# Patient Record
Sex: Male | Born: 1965 | Race: White | Hispanic: No | Marital: Married | Smoking: Former smoker
Health system: Southern US, Community
[De-identification: ages and names within clinical notes are randomized; demographics above are authoritative.]

## PROBLEM LIST (undated history)

## (undated) DIAGNOSIS — K219 Gastro-esophageal reflux disease without esophagitis: Secondary | ICD-10-CM

## (undated) DIAGNOSIS — G4700 Insomnia, unspecified: Secondary | ICD-10-CM

## (undated) DIAGNOSIS — J449 Chronic obstructive pulmonary disease, unspecified: Secondary | ICD-10-CM

## (undated) DIAGNOSIS — E039 Hypothyroidism, unspecified: Secondary | ICD-10-CM

## (undated) DIAGNOSIS — E119 Type 2 diabetes mellitus without complications: Secondary | ICD-10-CM

## (undated) DIAGNOSIS — J302 Other seasonal allergic rhinitis: Secondary | ICD-10-CM

## (undated) DIAGNOSIS — F419 Anxiety disorder, unspecified: Secondary | ICD-10-CM

## (undated) DIAGNOSIS — H7290 Unspecified perforation of tympanic membrane, unspecified ear: Secondary | ICD-10-CM

## (undated) DIAGNOSIS — M1990 Unspecified osteoarthritis, unspecified site: Secondary | ICD-10-CM

## (undated) DIAGNOSIS — G5603 Carpal tunnel syndrome, bilateral upper limbs: Secondary | ICD-10-CM

## (undated) DIAGNOSIS — G4730 Sleep apnea, unspecified: Secondary | ICD-10-CM

## (undated) DIAGNOSIS — F32A Depression, unspecified: Secondary | ICD-10-CM

## (undated) DIAGNOSIS — K120 Recurrent oral aphthae: Secondary | ICD-10-CM

## (undated) DIAGNOSIS — Z87438 Personal history of other diseases of male genital organs: Secondary | ICD-10-CM

## (undated) DIAGNOSIS — E782 Mixed hyperlipidemia: Secondary | ICD-10-CM

## (undated) DIAGNOSIS — K5732 Diverticulitis of large intestine without perforation or abscess without bleeding: Secondary | ICD-10-CM

## (undated) DIAGNOSIS — I10 Essential (primary) hypertension: Secondary | ICD-10-CM

## (undated) DIAGNOSIS — G894 Chronic pain syndrome: Secondary | ICD-10-CM

## (undated) DIAGNOSIS — N486 Induration penis plastica: Secondary | ICD-10-CM

## (undated) DIAGNOSIS — F329 Major depressive disorder, single episode, unspecified: Secondary | ICD-10-CM

## (undated) DIAGNOSIS — R51 Headache: Secondary | ICD-10-CM

## (undated) HISTORY — DX: Unspecified perforation of tympanic membrane, unspecified ear: H72.90

## (undated) HISTORY — DX: Carpal tunnel syndrome, bilateral upper limbs: G56.03

## (undated) HISTORY — DX: Hypothyroidism, unspecified: E03.9

## (undated) HISTORY — DX: Insomnia, unspecified: G47.00

## (undated) HISTORY — DX: Major depressive disorder, single episode, unspecified: F32.9

## (undated) HISTORY — DX: Diverticulitis of large intestine without perforation or abscess without bleeding: K57.32

## (undated) HISTORY — DX: Essential (primary) hypertension: I10

## (undated) HISTORY — PX: OTHER SURGICAL HISTORY: SHX169

## (undated) HISTORY — DX: Anxiety disorder, unspecified: F41.9

## (undated) HISTORY — DX: Chronic pain syndrome: G89.4

## (undated) HISTORY — DX: Mixed hyperlipidemia: E78.2

## (undated) HISTORY — DX: Gastro-esophageal reflux disease without esophagitis: K21.9

## (undated) HISTORY — DX: Other seasonal allergic rhinitis: J30.2

## (undated) HISTORY — DX: Depression, unspecified: F32.A

## (undated) HISTORY — PX: MENISCUS REPAIR: SHX5179

## (undated) HISTORY — DX: Personal history of other diseases of male genital organs: Z87.438

## (undated) HISTORY — DX: Recurrent oral aphthae: K12.0

## (undated) HISTORY — DX: Type 2 diabetes mellitus without complications: E11.9

## (undated) HISTORY — DX: Induration penis plastica: N48.6

---
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
## 1999-06-16 ENCOUNTER — Emergency Department (HOSPITAL_COMMUNITY): Admission: EM | Admit: 1999-06-16 | Discharge: 1999-06-16 | Payer: Self-pay | Admitting: Emergency Medicine

## 1999-11-14 ENCOUNTER — Encounter: Payer: Self-pay | Admitting: Otolaryngology

## 1999-11-14 ENCOUNTER — Encounter: Admission: RE | Admit: 1999-11-14 | Discharge: 1999-11-14 | Payer: Self-pay | Admitting: Otolaryngology

## 2002-08-14 ENCOUNTER — Encounter: Payer: Self-pay | Admitting: Emergency Medicine

## 2002-08-14 ENCOUNTER — Emergency Department (HOSPITAL_COMMUNITY): Admission: EM | Admit: 2002-08-14 | Discharge: 2002-08-14 | Payer: Self-pay | Admitting: Emergency Medicine

## 2002-12-01 ENCOUNTER — Encounter: Payer: Self-pay | Admitting: Specialist

## 2002-12-01 ENCOUNTER — Ambulatory Visit (HOSPITAL_COMMUNITY): Admission: RE | Admit: 2002-12-01 | Discharge: 2002-12-01 | Payer: Self-pay | Admitting: Specialist

## 2003-03-13 ENCOUNTER — Encounter
Admission: RE | Admit: 2003-03-13 | Discharge: 2003-06-11 | Payer: Self-pay | Admitting: Physical Medicine & Rehabilitation

## 2005-01-18 ENCOUNTER — Ambulatory Visit: Payer: Self-pay | Admitting: *Deleted

## 2007-03-07 ENCOUNTER — Ambulatory Visit: Payer: Self-pay

## 2010-08-19 ENCOUNTER — Encounter: Admission: RE | Admit: 2010-08-19 | Discharge: 2010-08-19 | Payer: Self-pay | Admitting: Orthopedic Surgery

## 2010-09-20 ENCOUNTER — Ambulatory Visit: Payer: Self-pay | Admitting: Vascular Surgery

## 2010-11-08 LAB — BASIC METABOLIC PANEL
BUN: 12 mg/dL (ref 6–23)
CO2: 26 mEq/L (ref 19–32)
Calcium: 9.5 mg/dL (ref 8.4–10.5)
Chloride: 105 mEq/L (ref 96–112)
Creatinine, Ser: 1.3 mg/dL (ref 0.4–1.5)
GFR calc Af Amer: >60 mL/min (ref >60)
GFR calc non Af Amer: 60 mL/min — ABNORMAL LOW (ref >60)
Glucose, Bld: 117 mg/dL — ABNORMAL HIGH (ref 70–99)
Potassium: 4.5 mEq/L (ref 3.5–5.1)
Sodium: 139 mEq/L (ref 135–145)

## 2010-11-08 LAB — CBC
HCT: 44.6 % (ref 39.0–52.0)
Hemoglobin: 14.7 g/dL (ref 13.0–17.0)
MCH: 30.1 pg (ref 26.0–34.0)
MCHC: 33 g/dL (ref 30.0–36.0)
MCV: 91.4 fL (ref 78.0–100.0)
Platelets: 216 10*3/uL (ref 150–400)
RBC: 4.88 MIL/uL (ref 4.22–5.81)
RDW: 12.8 % (ref 11.5–15.5)
WBC: 6.4 10*3/uL (ref 4.0–10.5)

## 2010-11-08 LAB — TYPE AND SCREEN
ABO/RH(D): A POS
Antibody Screen: NEGATIVE

## 2010-11-10 ENCOUNTER — Inpatient Hospital Stay (HOSPITAL_COMMUNITY): Payer: Worker's Compensation

## 2010-11-10 ENCOUNTER — Inpatient Hospital Stay (HOSPITAL_COMMUNITY)
Admission: RE | Admit: 2010-11-10 | Discharge: 2010-11-12 | DRG: 490 | Disposition: A | Discharge status: Home Health | Payer: Worker's Compensation | Attending: Orthopedic Surgery | Admitting: Orthopedic Surgery

## 2010-11-10 DIAGNOSIS — M519 Unspecified thoracic, thoracolumbar and lumbosacral intervertebral disc disorder: Principal | ICD-10-CM | POA: Diagnosis present

## 2010-11-10 DIAGNOSIS — K219 Gastro-esophageal reflux disease without esophagitis: Secondary | ICD-10-CM | POA: Diagnosis present

## 2010-11-10 DIAGNOSIS — E119 Type 2 diabetes mellitus without complications: Secondary | ICD-10-CM | POA: Diagnosis present

## 2010-11-10 DIAGNOSIS — M5137 Other intervertebral disc degeneration, lumbosacral region: Secondary | ICD-10-CM

## 2010-11-10 DIAGNOSIS — I10 Essential (primary) hypertension: Secondary | ICD-10-CM | POA: Diagnosis present

## 2010-11-10 DIAGNOSIS — Z87891 Personal history of nicotine dependence: Secondary | ICD-10-CM

## 2010-11-10 DIAGNOSIS — Z882 Allergy status to sulfonamides status: Secondary | ICD-10-CM

## 2010-11-10 LAB — GLUCOSE, CAPILLARY
Glucose-Capillary: 134 mg/dL — ABNORMAL HIGH (ref 70–99)
Glucose-Capillary: 129 mg/dL — ABNORMAL HIGH (ref 70–99)

## 2010-11-11 ENCOUNTER — Inpatient Hospital Stay (HOSPITAL_COMMUNITY): Payer: Worker's Compensation

## 2010-11-11 DIAGNOSIS — M79609 Pain in unspecified limb: Secondary | ICD-10-CM

## 2010-11-11 LAB — GLUCOSE, CAPILLARY
Glucose-Capillary: 130 mg/dL — ABNORMAL HIGH (ref 70–99)
Glucose-Capillary: 108 mg/dL — ABNORMAL HIGH (ref 70–99)

## 2010-11-12 LAB — GLUCOSE, CAPILLARY

## 2010-11-14 NOTE — Op Note (Signed)
NAME:  Corey Bell, Corey Bell NO.:  [id number]  MEDICAL RECORD NO.:  [id number]           PATIENT TYPE:  I  LOCATION:  5029                         FACILITY:  [facility]  PHYSICIAN:  Alvy Beal, MD    DATE OF BIRTH:  04/22/1966  DATE OF PROCEDURE: DATE OF DISCHARGE:                              OPERATIVE REPORT   PREOPERATIVE DIAGNOSIS:  Diskogenic low back pain, L5-S1.  POSTOPERATIVE DIAGNOSIS:  Diskogenic low back pain, L5-S1.  OPERATIVE PROCEDURE:  Anterior lumbar total disk replacement L5-S1.  INSTRUMENTATION SYSTEM USED:  ProDisc-L from [employer], size was a medium 10 implant.  COMPLICATIONS:  None.  CONDITION:  Stable.  APPROACH SURGEON:  Larina Earthly, MD  HISTORY:  Corey Bell is a very pleasant 45 year old gentleman who has been having progressive debilitating back pain secondary to a work-related injury.  After attempts at conservative management had failed, he elected to proceed with surgery.  All appropriate risks, benefits, and alternatives were discussed with the patient and consent was obtained.  OPERATIVE NOTE:  The patient was brought to the operating room, placed supine on the operating table.  After successful induction of general anesthesia and endotracheal intubation, TEDs, SCDs, and Foley were placed.  The patient's abdomen was prepped and draped in a standard fashion.  Appropriate time-out was done to confirm patient, procedure, and all other pertinent important data.  At this point, Dr. Arbie Cookey then did a standard retroperitoneal approach to the lumbar spine.  I did assist him for this approach.  Once we had adequate exposure of L5-S1, the Thompson retractors were placed and then we switched positions.  Dr. Arbie Cookey then assisted me by retracting and protecting the vascular structures.  I incised the annulus with a 10- blade scalpel, and using a combination of pituitary rongeurs, curettes, and Kerrison rongeurs, I removed all of the L5-S1 disk  material.  I then took a fine-angled curettes and released the annulus from the posterior aspect of the L5 and the S1 vertebral body.  I then used 2- and 3-mm Kerrison to trim down the posterior osteophytes and resected the majority of the annulus.  At this point under live fluoroscopy, I had the laminar spreader in the space and under live fluoroscopy confirmed that I had parallel distraction and that there was no fishmouthing of the vertebral body.  With the diskectomy complete, I then obtained a size 10, 11-degree lordotic trial, and malleted it to the appropriate depth.  I confirmed center position and then made sure that the posterior aspect was right at the posterior lip of the vertebral body. With it properly positioned, I then used the keel cutter and malleted that to the appropriate depth.  With the keel cut made, I removed the implant, the trial lead device, and then took the actual implant and malleted it to the appropriate depth.  Once the implant was properly seated, I then placed the poly and advanced the poly.  I then made sure the poly using a final pusher was properly engaged.  There was no step- off and it was flushed.  There was flush to the implant and there  was no gap.  At this point, I removed the inserter device, irrigated copiously with normal saline.  Dr. Arbie Cookey was there to check that we had adequate hemostasis.  He removed the retractors and then we closed the fascia of the rectus with running #1 Vicryl suture and then I did a running 0 Vicryl for the deep fascia, 2-0 for the superficial, and 3-0 Monocryl for the skin.  Final x-rays were taken which demonstrated midline position of the implant in satisfactory depth on the lateral. Intraoperative AP digital film was read by the radiologist and there was no surgical apparatus left inadvertently.  At this point with the sponge counts correct, a dry dressing was applied.  The patient was extubated, transferred to the  PACU without incident.     Alvy Beal, MD     DDB/MEDQ  D:  11/10/2010  T:  11/11/2010  Job:  [id number]  cc:   Larina Earthly, M.D.  Electronically Signed by Venita Lick MD on 11/14/2010 10:41:17 AM

## 2010-11-16 NOTE — Op Note (Addendum)
  NAME:  Corey Bell, BOMBERGER NO.:  [id number]  MEDICAL RECORD NO.:  [id number]           PATIENT TYPE:  I  LOCATION:  5029                         FACILITY:  [facility]  PHYSICIAN:  Larina Earthly, M.D.    DATE OF BIRTH:  April 08, 1966  DATE OF PROCEDURE:  11/10/2010 DATE OF DISCHARGE:  11/12/2010                              OPERATIVE REPORT   PREOPERATIVE DIAGNOSIS:  Lumbar disk disease.  POSTOPERATIVE DIAGNOSIS:  Lumbar disk disease.  PROCEDURE:  Anterior exposure for L5-S1 ALIF, which will be dictated as a separate note by Dr. Venita Lick.  Co-surgeons for the exposure, Dr. Arbie Cookey and Dr. Shon Baton.  ANESTHESIA:  General endotracheal.  COMPLICATIONS:  None.  INDICATIONS FOR PROCEDURE:  The patient is a 45 year old gentleman with progressively severe L5-S1 disk disease.  I had seen him as an outpatient in our office, discussing my role for exposure.  I explained mobilization of intraperitoneal contents, ureter and iliac vessels, and potential injury of these.  PROCEDURE IN DETAIL:  The patient was taken to the operating room, placed in supine position where the area of the abdomen was prepped and draped in usual sterile fashion.  The patient was significantly obese. A low transverse incision was made from the midline to the left lateral area, carried down through the subcutaneous fat with electrocautery. The anterior fascia was mobilized for closure.  The anterior rectus sheath was opened in line with a skin incision.  The rectus muscle was mobilized circumferentially, proximally and distally to give adequate exposure.  The retroperitoneum was entered bluntly on the lateral aspect below the semilunar line.  The posterior fascia was mobilized, and the posterior fascia was opened laterally above the semilunar line for exposure.  Blunt dissection was used above the level of the psoas plane to mobilize the retroperitoneal contents.  The peritoneal sac was left intact  and was mobilized.  The iliac vessels were noted in the location of L5-S1, this was below the iliac bifurcation.  The middle sacral vessels were divided with Ligaclips for better mobilization.  Blunt dissection was used to give adequate exposure to the lateral aspect of the L5-S1 disk bilaterally.  Thompson retractor was brought on to the field, and the 200 blades were positioned to the right and left of the L5-S1 disk.  The remainder of the procedure will be dictated as a separate note by Dr. Venita Lick.    Larina Earthly, M.D.    TFE/MEDQ  D:  11/14/2010  T:  11/15/2010  Job:  [id number]  Electronically Signed by Branko Steeves M.D. on 11/16/2010 11:24:58 AM

## 2010-11-29 NOTE — Discharge Summary (Signed)
  NAME:  Corey Bell, Corey Bell NO.:  [id number]  MEDICAL RECORD NO.:  [id number]           PATIENT TYPE:  I  LOCATION:  5029                         FACILITY:  [facility]  PHYSICIAN:  Alvy Beal, MD    DATE OF BIRTH:  1966-09-26  DATE OF ADMISSION:  11/10/2010 DATE OF DISCHARGE:  11/12/2010                              DISCHARGE SUMMARY   ADMITTING DIAGNOSIS:  Lumbar degenerative disk disease with diskogenic back pain.  DISCHARGE DIAGNOSIS:  Lumbar degenerative disk disease with diskogenic back pain.  HISTORY:  This is a very pleasant 46 year old gentleman who has had progressive disabling L5-S1 disk pain.  Attempts at conservative management had failed to alleviate his symptoms.  As the result, decision was made to take him to the operating room for a total disk replacement L5-S1.  Please refer to the attached office notes for specifics on the remainder of his past medical, surgical, family and social history.On the day of admission, the patient was taken to the operating room for total disk replacement L5-S1.  Surgery was uneventful.  There was no adverse intraoperative events.  On postoperative day 1, he was doing quite well.  Doppler studies were unremarkable.  X-rays were satisfactory.  He was tolerating regular diet.  On postoperative day 2, he was voiding spontaneously.  Pulses remained intact and he has positive flatus.  The patient was ultimately discharged after clearing physical therapy with preprinted instructions and appropriate followup and medications.     Alvy Beal, MD     DDB/MEDQ  D:  11/24/2010  T:  11/25/2010  Job:  [id number]  Electronically Signed by Venita Lick MD on 11/29/2010 02:30:22 PM

## 2011-02-21 NOTE — Consult Note (Signed)
NEW PATIENT CONSULTATION   Corey Bell, Corey Bell  DOB:  1966/03/03                                       09/20/2010  CHART#:[id number]   The patient presents today for discussion regarding potential anterior  interbody lumbar fusion with anterior approach with Dr. Venita Lick.  I have records provided by Dr. Shon Baton from his most recent evaluation on  08/23/2010.  His opinion was that his best result would be obtained with  lumbar disk replacement at the L5-S1 level with anterior approach.  I am  seeing him to discuss the technical aspects of the anterior approach for  disk replacement.  The patient does have history of hypertension.  He is  felt to be a prediabetic.  He does not require any treatment for this.  He has no other major medical difficulties.   SOCIAL HISTORY:  He works as a Estate manager/land agent.  He does not smoke,  having quit in January 2009.  He does not drink alcohol.  He is married  with no children.   FAMILY HISTORY:  He does have history of premature atherosclerotic  disease in his father and brother.   REVIEW OF SYSTEMS:  He weighs 285 pounds.  He is 6 feet 2 inches tall.  MUSCULOSKELETAL:  Arthritis and joint pain.  PSYCHIATRIC:  Depression.  Otherwise his review of systems is negative.   PHYSICAL EXAMINATION:  Well-developed, obese gentleman in no acute  distress.  Vital signs:  Blood pressure 123/79 pulse 61, respirations  22.  HEENT:  Normal.  Abdomen:  Soft, nontender.  I do not feel any  masses.  He does have moderate obesity.  He does have palpable femoral  pulses and palpable dorsalis pedis pulses bilaterally.  He does have  scattered telangiectasia over both legs but no evidence of varicosities.  Musculoskeletal:  Shows no major deformities or cyanosis.  Neurologic:  No focal weakness or paresthesias.  Skin:  Without ulcers or rashes.   I had a long discussion with [name] and his wife present.  I  explained my role for exposure for  the L5-S1 level for disk replacement  by Dr. Shon Baton.  I explained that there is some modest increased  morbidity due to obesity but certainly do not feel that he has any  prohibitive risk related to this.  I did explain mobilization of the  intraperitoneal contents, the ureter and the iliac arteries and veins  and potential injury of all these.  He understands and wishes to proceed  with surgery.  I feel this is appropriate.  We will coordinate with Dr.  Shon Baton.  Interestingly, his wife has extreme anxiety and reports that  she becomes quite anxious when Mr. Hutmacher is out of her sight and is  quite concerned regarding this and will see her doctor for increased  medication related to this.  Mr. Reitter's mother will be present and, as  Mrs. Micucci puts it, for support for Mr. Huntsman and also to help with Mrs.  Koziol's anxiety.  I will be available for exposure when Dr. Shon Baton  wishes to proceed.     Larina Earthly, M.D.  Electronically Signed   TFE/MEDQ  D:  09/20/2010  T:  09/21/2010  Job:  [id number]   cc:   Alvy Beal, MD

## 2011-03-07 ENCOUNTER — Ambulatory Visit (INDEPENDENT_AMBULATORY_CARE_PROVIDER_SITE_OTHER): Payer: Worker's Compensation | Admitting: Vascular Surgery

## 2011-03-07 DIAGNOSIS — IMO0002 Reserved for concepts with insufficient information to code with codable children: Secondary | ICD-10-CM

## 2011-03-07 NOTE — Assessment & Plan Note (Signed)
OFFICE VISIT  URA, HAUSEN DOB:  12-Jun-1966                                       03/07/2011 WGNFA#:[id number]  Patient presents today for evaluation of his abdominal incision.  He is well-known to me from an anterior exposure for L5-S1 ALIF with Dr. Venita Lick on 11/10/10.  He had an uneventful recovery.  He has had the interesting phenomenon of on approximately 4 separate occasions, the medial central pole of his incision become inflamed and then spontaneously opening and draining for a day or 2, then closing again. He has no fevers associated with this.  Today his incision is completely healed.  There is an area where there was concern, but it is slightly thickened and certainly not fluctuant and a little darker-appearing on the remainder of the incision.  I discussed this at length with patient and his wife.  I do not see any evidence of fluctuance or stitch abscess.  This certainly sounds as though there has been recurrent opening from foreign body or stitch present.  I did explain that we could excise this area in the hospital but certainly feel like this would be a greater magnitude of surgery then would be indicated.  I explained that this very well may finally heal, or it could erupt again.  I have asked that they notify us immediately should this become inflamed again so we can see when this is acting up.  We will instruct the office to have him see [place] immediately should this occur.  Patient and his wife were comfortable with this explanation and will see [place] again on if he has further wound difficulty.    Larina Earthly, M.D. Electronically Signed  TFE/MEDQ  D:  03/07/2011  T:  03/07/2011  Job:  [id number]  cc:   Alvy Beal, MD

## 2011-03-22 ENCOUNTER — Ambulatory Visit (INDEPENDENT_AMBULATORY_CARE_PROVIDER_SITE_OTHER): Payer: Worker's Compensation

## 2011-03-22 DIAGNOSIS — T8189XA Other complications of procedures, not elsewhere classified, initial encounter: Secondary | ICD-10-CM

## 2011-03-22 NOTE — Assessment & Plan Note (Signed)
OFFICE VISIT  Corey Bell, Corey Bell DOB:  01-27-1966                                       03/22/2011 CHART#:[id number]  HISTORY OF PRESENT ILLNESS:  Patient is a 45 year old gentleman who had a spine procedure with exposure by Dr. Arbie Cookey on 11/10/2010.  The patient has done very well.  His wound healed except for an approximately 4 mm area 2 cm from the medial aspect of the wound, which has continued to blister on occasion and then drained some bloody material.  There has been no purulence, no erythema.  It is draining approximately a teaspoon of bloody drainage.  The wife states that this area will blow up like a blister and then pop and drain the fluid.  He denies any fever or chills.  There is no pain in the area.  There is no pain with this when it forms a small blister.  He states this has been happening on and off for the last 8 weeks, and the most recent event was last Friday evening.  PHYSICAL EXAMINATION:  This is a well-developed, well-nourished gentleman in no acute distress who has moderate pannus.  He has a well- healed wound in the left lower quadrant with an approximately 4 mm area of ecchymotic blister-like skin which is very superficial.  There is no fluctuans or cavity.  There is no drainage at this point.  This wound is grossly 2 cm from the medial aspect of the incision.  There is no erythema.  It is nontender.  Dr. Arbie Cookey was in to see the patient and did a SonoSite ultrasound evaluation of the incision.  There is no pocket of fluid noted along any part of the incision, including the area over this lesion.  It was felt that this will eventually resolve on its own, and we will keep a watch on this.  The patient should return on the day when this blister actively pops so that we can evaluate it at that time.  VITAL SIGNS:  Heart rate was 72.  His sats were 100%.  His respiratory rate was 12.  Della Goo, PA-C  [name]. Edilia Bo,  M.D. Electronically Signed  RR/MEDQ  D:  03/22/2011  T:  03/22/2011  Job:  [id number]

## 2012-06-03 DIAGNOSIS — M5137 Other intervertebral disc degeneration, lumbosacral region: Secondary | ICD-10-CM

## 2012-06-28 ENCOUNTER — Other Ambulatory Visit: Payer: Self-pay

## 2012-06-28 LAB — COMPREHENSIVE METABOLIC PANEL
Albumin: 4.1 g/dL (ref 3.4–5.0)
Anion Gap: 9 (ref 7–16)
BUN: 14 mg/dL (ref 7–18)
Bilirubin,Total: 1.4 mg/dL — ABNORMAL HIGH (ref 0.2–1.0)
Creatinine: 1.22 mg/dL (ref 0.60–1.30)
EGFR (Non-African Amer.): >60
Glucose: 106 mg/dL — ABNORMAL HIGH (ref 65–99)
Osmolality: 286 (ref 275–301)
Potassium: 3.9 mmol/L (ref 3.5–5.1)
SGOT(AST): 46 U/L — ABNORMAL HIGH (ref 15–37)
Sodium: 143 mmol/L (ref 136–145)
Total Protein: 7.3 g/dL (ref 6.4–8.2)

## 2012-09-30 ENCOUNTER — Emergency Department: Payer: Self-pay | Admitting: Emergency Medicine

## 2012-10-04 DIAGNOSIS — K120 Recurrent oral aphthae: Secondary | ICD-10-CM

## 2012-10-04 HISTORY — DX: Recurrent oral aphthae: K12.0

## 2012-12-18 ENCOUNTER — Emergency Department: Payer: Self-pay | Admitting: Emergency Medicine

## 2012-12-18 LAB — URINALYSIS, COMPLETE
Bacteria: NONE SEEN
Bilirubin,UR: NEGATIVE
Blood: NEGATIVE
Glucose,UR: NEGATIVE mg/dL (ref 0–75)
Leukocyte Esterase: NEGATIVE
RBC,UR: 1 /HPF (ref 0–5)
Specific Gravity: 1.024 (ref 1.003–1.030)
Squamous Epithelial: NONE SEEN

## 2012-12-18 LAB — COMPREHENSIVE METABOLIC PANEL
Anion Gap: 6 — ABNORMAL LOW (ref 7–16)
Bilirubin,Total: 1.1 mg/dL — ABNORMAL HIGH (ref 0.2–1.0)
Chloride: 108 mmol/L — ABNORMAL HIGH (ref 98–107)
Co2: 25 mmol/L (ref 21–32)
Creatinine: 1.07 mg/dL (ref 0.60–1.30)
SGPT (ALT): 66 U/L (ref 12–78)
Total Protein: 7.6 g/dL (ref 6.4–8.2)

## 2012-12-18 LAB — CBC
HGB: 15.5 g/dL (ref 13.0–18.0)
MCH: 32.3 pg (ref 26.0–34.0)
Platelet: 231 10*3/uL (ref 150–440)

## 2012-12-18 LAB — PHOSPHORUS: Phosphorus: 3.5 mg/dL (ref 2.5–4.9)

## 2012-12-18 LAB — MAGNESIUM: Magnesium: 1.5 mg/dL — ABNORMAL LOW

## 2012-12-18 LAB — CK TOTAL AND CKMB (NOT AT ARMC): CK-MB: 2 ng/mL (ref 0.5–3.6)

## 2012-12-20 DIAGNOSIS — N486 Induration penis plastica: Secondary | ICD-10-CM

## 2012-12-20 HISTORY — DX: Induration penis plastica: N48.6

## 2012-12-26 ENCOUNTER — Emergency Department: Payer: Self-pay | Admitting: Emergency Medicine

## 2012-12-26 LAB — BASIC METABOLIC PANEL
Calcium, Total: 8.4 mg/dL — ABNORMAL LOW (ref 8.5–10.1)
Co2: 24 mmol/L (ref 21–32)
Creatinine: 1.43 mg/dL — ABNORMAL HIGH (ref 0.60–1.30)
EGFR (Non-African Amer.): 58 — ABNORMAL LOW
Glucose: 124 mg/dL — ABNORMAL HIGH (ref 65–99)
Osmolality: 280 (ref 275–301)
Potassium: 3.9 mmol/L (ref 3.5–5.1)

## 2012-12-26 LAB — CK TOTAL AND CKMB (NOT AT ARMC)
CK, Total: 75 U/L (ref 35–232)
CK-MB: 0.5 ng/mL (ref 0.5–3.6)

## 2012-12-26 LAB — CBC
HCT: 44.6 % (ref 40.0–52.0)
MCH: 32 pg (ref 26.0–34.0)
MCHC: 34 g/dL (ref 32.0–36.0)
MCV: 94 fL (ref 80–100)
Platelet: 218 10*3/uL (ref 150–440)
RBC: 4.74 10*6/uL (ref 4.40–5.90)
WBC: 11.5 10*3/uL — ABNORMAL HIGH (ref 3.8–10.6)

## 2013-01-07 DIAGNOSIS — J45909 Unspecified asthma, uncomplicated: Secondary | ICD-10-CM | POA: Insufficient documentation

## 2013-01-07 DIAGNOSIS — J449 Chronic obstructive pulmonary disease, unspecified: Secondary | ICD-10-CM | POA: Insufficient documentation

## 2013-02-27 ENCOUNTER — Encounter: Payer: Self-pay | Admitting: *Deleted

## 2013-03-05 ENCOUNTER — Ambulatory Visit: Payer: PRIVATE HEALTH INSURANCE | Admitting: Cardiology

## 2013-03-06 ENCOUNTER — Encounter: Payer: Self-pay | Admitting: *Deleted

## 2013-03-31 ENCOUNTER — Ambulatory Visit: Payer: PRIVATE HEALTH INSURANCE | Admitting: Family Medicine

## 2013-04-22 ENCOUNTER — Ambulatory Visit: Payer: Self-pay

## 2013-05-28 ENCOUNTER — Encounter: Payer: Self-pay | Admitting: Family Medicine

## 2013-05-28 ENCOUNTER — Encounter: Payer: Self-pay | Admitting: Cardiology

## 2013-05-28 ENCOUNTER — Ambulatory Visit (INDEPENDENT_AMBULATORY_CARE_PROVIDER_SITE_OTHER): Payer: BC Managed Care – PPO | Admitting: Cardiology

## 2013-05-28 ENCOUNTER — Ambulatory Visit (INDEPENDENT_AMBULATORY_CARE_PROVIDER_SITE_OTHER): Payer: BC Managed Care – PPO | Admitting: Family Medicine

## 2013-05-28 VITALS — BP 128/76 | HR 76 | Ht 74.0 in | Wt 293.0 lb

## 2013-05-28 VITALS — BP 112/80 | HR 60 | Temp 98.0°F | Ht 73.25 in | Wt 290.0 lb

## 2013-05-28 DIAGNOSIS — R251 Tremor, unspecified: Secondary | ICD-10-CM

## 2013-05-28 DIAGNOSIS — M5136 Other intervertebral disc degeneration, lumbar region: Secondary | ICD-10-CM | POA: Insufficient documentation

## 2013-05-28 DIAGNOSIS — F32A Depression, unspecified: Secondary | ICD-10-CM

## 2013-05-28 DIAGNOSIS — R259 Unspecified abnormal involuntary movements: Secondary | ICD-10-CM

## 2013-05-28 DIAGNOSIS — M51369 Other intervertebral disc degeneration, lumbar region without mention of lumbar back pain or lower extremity pain: Secondary | ICD-10-CM | POA: Insufficient documentation

## 2013-05-28 DIAGNOSIS — R0609 Other forms of dyspnea: Secondary | ICD-10-CM

## 2013-05-28 DIAGNOSIS — Z8249 Family history of ischemic heart disease and other diseases of the circulatory system: Secondary | ICD-10-CM

## 2013-05-28 DIAGNOSIS — F329 Major depressive disorder, single episode, unspecified: Secondary | ICD-10-CM | POA: Insufficient documentation

## 2013-05-28 DIAGNOSIS — I10 Essential (primary) hypertension: Secondary | ICD-10-CM

## 2013-05-28 DIAGNOSIS — R0602 Shortness of breath: Secondary | ICD-10-CM

## 2013-05-28 DIAGNOSIS — G894 Chronic pain syndrome: Secondary | ICD-10-CM

## 2013-05-28 DIAGNOSIS — J45909 Unspecified asthma, uncomplicated: Secondary | ICD-10-CM

## 2013-05-28 DIAGNOSIS — Z136 Encounter for screening for cardiovascular disorders: Secondary | ICD-10-CM

## 2013-05-28 DIAGNOSIS — E039 Hypothyroidism, unspecified: Secondary | ICD-10-CM

## 2013-05-28 DIAGNOSIS — F419 Anxiety disorder, unspecified: Secondary | ICD-10-CM | POA: Insufficient documentation

## 2013-05-28 DIAGNOSIS — E785 Hyperlipidemia, unspecified: Secondary | ICD-10-CM

## 2013-05-28 LAB — LIPID PANEL
Cholesterol: 201 mg/dL — ABNORMAL HIGH (ref 0–200)
HDL: 32.6 mg/dL — ABNORMAL LOW (ref >39.00)
VLDL: 38 mg/dL (ref 0.0–40.0)

## 2013-05-28 LAB — COMPREHENSIVE METABOLIC PANEL
AST: 28 U/L (ref 0–37)
BUN: 19 mg/dL (ref 6–23)
Calcium: 9.7 mg/dL (ref 8.4–10.5)
Chloride: 106 mEq/L (ref 96–112)
Creatinine, Ser: 1.4 mg/dL (ref 0.4–1.5)
Glucose, Bld: 98 mg/dL (ref 70–99)

## 2013-05-28 LAB — LDL CHOLESTEROL, DIRECT: Direct LDL: 141.7 mg/dL

## 2013-05-28 LAB — HEMOGLOBIN A1C: Hgb A1c MFr Bld: 6.3 % (ref 4.6–6.5)

## 2013-05-28 LAB — TSH: TSH: 1.41 u[IU]/mL (ref 0.35–5.50)

## 2013-05-28 MED ORDER — LEVALBUTEROL TARTRATE 45 MCG/ACT IN AERO: 1.0000 | INHALATION_SPRAY | RESPIRATORY_TRACT | Status: DC | PRN

## 2013-05-28 NOTE — Progress Notes (Signed)
Subjective:    Patient ID: Corey Bell, male    DOB: November 16, 1965, 47 y.o.   MRN: [id number]  HPI  47 yo male here with his wife to establish care and discuss multiple "issues."  Has been followed by Dr. Quillian Quince in [facility] and wants to discuss with me how we manage acute issues and phone messages here as compared to previous office.  COPD- on Advair, prn xopenex.  Has not yet started xopenex yet.  Was on ventolin but felt it was causing tremors.  Those tremors have not yet resolved. Followed by Dr. Arlis Porta in [place].  Just had sleep study.  Results not yet available.  Has been very fatigued.  H/o hypothyroidism- on Synthroid 75 mcg daily.  Not sure when last thyroid panel was checked.  Strong FH of CAD- has an appointment Dr. Shirlee Latch later this afternoon for further risk stratification.  Two brothers with premature CAD. He is a former smoker- smoked 2 ppd but quit 6 years ago.  Unsure when he last had a lipid panel.  No known h/o DM.  Back pain- chronic issue.  S/p L5/S1 surgery.  Not working due to this pain.  ?Egg allergy- has been told he does not have one but he is certain that he does.  Has belching and GI symptoms when he eats eggs.  Patient Active Problem List   Diagnosis Date Noted  . Family history of heart disease 05/28/2013  . Depression 05/28/2013  . Tremor 05/28/2013  . Anxiety   . Lumbar degenerative disc disease   . Chronic pain syndrome   . Hypothyroidism   . 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 Carpal tunnel syndrome, bilateral   . H/O acute prostatitis   . Unspecified essential hypertension   . Diverticulitis of colon   . Esophageal reflux   . Mixed hyperlipidemia   . Penile mass 10-04-12  . 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 Seasonal allergies   . Aphthous ulcer 10-04-12    improving  . Chronic pain syndrome   . Insomnia, unspecified   . Tympanic membrane perforation     history of  . Anxiety   .  Lumbar degenerative disc disease   . 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 Hypothyroidism    Past Surgical History  Procedure Laterality Date  . Back surgery    . Tympanostomy tube placement  1975  . Knee surgery      torn meniscus   History  Substance Use Topics  . Smoking status: Former Smoker -- 1.50 packs/day for 30 years    Types: Cigarettes  . Smokeless tobacco: Never Used  . Alcohol Use: Yes     Comment: rarely   Family History  Problem Relation Age of Onset  . Bladder Cancer Paternal Uncle   . Renal cancer Paternal Uncle     renal cell carcinoma  . Congestive Heart Failure Father   . Heart disease Father   . COPD Father   . Heart disease Brother   . Heart disease Brother     MI at 52  . Renal cancer Paternal Grandfather     renal call carcinoma  . Heart disease Brother     stents at 88 yo   Allergies  Allergen Reactions  . Eggs Or Egg-Derived Products Nausea Only  . Other     Mammalian meats   . Statins Rash  . Sulfa Antibiotics Rash   No current  outpatient prescriptions on file prior to visit.   No current facility-administered medications on file prior to visit.   The PMH, PSH, Social History, Family History, Medications, and allergies have been reviewed in [facility], and have been updated if relevant.   Review of Systems See HPI +anxiety No HA No CP, SOB or DOE     Objective:   Physical Exam BP 112/80  Pulse 60  Temp(Src) 98 F (36.7 C)  Ht 6' 1.25" (1.861 m)  Wt 290 lb (131.543 kg)  BMI 37.98 kg/m2 General:  overweght male in NAD Eyes:  PERRL Ears:  External ear exam shows no significant lesions or deformities.  Otoscopic examination reveals clear canals, tympanic membranes are intact bilaterally without bulging, retraction, inflammation or discharge. Hearing is grossly normal bilaterally. Nose:  External nasal examination shows no deformity or inflammation. Nasal mucosa are pink and moist without lesions or exudates. Mouth:  Oral mucosa and oropharynx  without lesions or exudates.  Teeth in good repair. Neck:  no carotid bruit or thyromegaly no cervical or supraclavicular lymphadenopathy  Lungs:  Normal respiratory effort, chest expands symmetrically. Lungs are clear to auscultation, no crackles or wheezes. Heart:  Normal rate and regular rhythm. S1 and S2 normal without gallop, murmur, click, rub or other extra sounds. Abdomen:  Bowel sounds positive,abdomen soft and non-tender without masses, organomegaly or hernias noted. Pulses:  R and L posterior tibial pulses are full and equal bilaterally  Extremities:  no edema     Assessment & Plan:  1. Family history of heart disease Has appt with cardiology later today. Will check lipids for further risk stratification. - Comprehensive metabolic panel  2. Screening for ischemic heart disease  - Lipid Panel  3. Chronic pain syndrome With low back pain as well.  Awaiting records.  4. Depression Has had some worsening stressors lately.  Per pt, intolerant to SSRIs due to sexual dysfunction.  Subtherapeutic on current dose of Wellbutrin but he does not want to increase as it tends to worsen his anxiety at higher dose.  We agreed to revisit this issue when he returns for CPX next month after I have reviewed his records.  5. Intrinsic asthma Awaiting records.  ?also OSA- had recent sleep study. Will await cardiology recs- should consider another beta blocker.  6. Tremor No tremor on exam today.  He states it is resting and intention (when he holds things).  Discussed neuro referral- he will think about this and discussed further.  Certainly could be exacerbated by his medications. - Hemoglobin A1c  7. Hypothyroidism Recheck labs today. - TSH - T4, Free

## 2013-05-28 NOTE — Patient Instructions (Signed)
It was nice to meet you. Please schedule an appointment to come see me for a complete physical in a month.  We will call you with your lab results.

## 2013-05-28 NOTE — Patient Instructions (Addendum)
Your physician has requested that you have an echocardiogram. Echocardiography is a painless test that uses sound waves to create images of your heart. It provides your doctor with information about the size and shape of your heart and how well your heart's chambers and valves are working. This procedure takes approximately one hour. There are no restrictions for this procedure.  Your physician has requested that you have a lexiscan myoview. For further information please visit [email or web address] Please follow instruction sheet, as given.  Your physician wants you to follow-up in: 6 months with Dr Shirlee Latch. (February 2014).  You will receive a reminder letter in the mail two months in advance. If you don't receive a letter, please call our office to schedule the follow-up appointment.

## 2013-05-29 ENCOUNTER — Telehealth: Payer: Self-pay | Admitting: *Deleted

## 2013-05-29 ENCOUNTER — Encounter: Payer: Self-pay | Admitting: Cardiology

## 2013-05-29 DIAGNOSIS — E785 Hyperlipidemia, unspecified: Secondary | ICD-10-CM

## 2013-05-29 DIAGNOSIS — I10 Essential (primary) hypertension: Secondary | ICD-10-CM | POA: Insufficient documentation

## 2013-05-29 MED ORDER — ROSUVASTATIN CALCIUM 5 MG PO TABS: ORAL_TABLET | ORAL | Status: DC

## 2013-05-29 NOTE — Telephone Encounter (Signed)
Pt advised,verbalized understanding. 

## 2013-05-29 NOTE — Telephone Encounter (Signed)
Please have Corey Bell start Crestor 5 mg every other day with lipids/LFTs in 2 months based on his lipid profile. If he tolerates 5 mg every other day after 2 wks, he may increase to 5 mg daily   05/29/13 LMTCB

## 2013-05-29 NOTE — Progress Notes (Signed)
Patient ID: Corey Bell, male   DOB: May 29, 1966, 47 y.o.   MRN: [id number] PCP: Dr. Dayton Martes  47 yo with h/o COPD, OSA, HTN, and hyperlipidemia with strong family history of CAD presents for cardiology evaluation. He used to smoke 2 ppd but quit smoking in 2012.  He had a brother with an MI at 29 and another brother with PCI at 44.  He has had the gradual onset over the last 2 years or so of exertional dyspnea.  This has worsened over time. He is short of breath walking up a flight of steps or walking 25 yards fast.  He is short of breath with yardwork.  No chest pain.  He has to prop himself up at night due to dyspnea lying flat.  He snores and gasps at night and just had a sleep study at [facility] (results pending).  He took a statin in the past but had myalgias with it, not sure which one.  He has lipids, etc, drawn earlier today at [facility] office.  He is on disability because of back pain.   ECG: NSR, borderline LVH  PMH: 1. COPD: Mild.   2. Hypothyroidism 3. Obesity 4. Low back pain s/p surgery 5. Depression 6. OSA: Had recent sleep study at [facility], awaiting results. 7. HTN 8. Hyperlipidemia: Myalgias and rash with statin a number of years ago, not sure which one.   SH: Married, lives in [place], disabled (back), used to smoke 2 ppd but quit in 2012.    FH: Brother with MI at 46, other brother with PCI at 80, father with CAD.   ROS: All systems reviewed and negative except as per HPI.   Current Outpatient Prescriptions  Medication Sig Dispense Refill  . aspirin 325 MG tablet Take 2 by mouth daily      . Beclomethasone Diprop, Nasal, (QNASL NA) 2 sprays each nostril daily      . buPROPion (WELLBUTRIN SR) 150 MG 12 hr tablet Take 150 mg by mouth daily.      . fexofenadine (ALLEGRA) 180 MG tablet Take 180 mg by mouth daily.      . Fluticasone-Salmeterol (ADVAIR) 100-50 MCG/DOSE AEPB Inhale 1 puff into the lungs every 12 (twelve) hours.      . gabapentin (NEURONTIN) 400 MG capsule Take 6 by  mouth daily      . levalbuterol (XOPENEX HFA) 45 MCG/ACT inhaler Inhale 1-2 puffs into the lungs every 4 (four) hours as needed for wheezing.  1 Inhaler  12  . levothyroxine (SYNTHROID, LEVOTHROID) 75 MCG tablet Take 75 mcg by mouth daily before breakfast.      . lisinopril (PRINIVIL,ZESTRIL) 10 MG tablet Take 10 mg by mouth daily.      . Melatonin 5 MG TABS Take by mouth as needed      . meloxicam (MOBIC) 15 MG tablet Take one to two tablets by mouth daily      . metoprolol succinate (TOPROL-XL) 50 MG 24 hr tablet Take 50 mg by mouth daily. Take with or immediately following a meal.      . montelukast (SINGULAIR) 10 MG tablet Take 10 mg by mouth at bedtime.      . niacin 500 MG tablet Take 500 mg by mouth daily with breakfast.      . tiotropium (SPIRIVA) 18 MCG inhalation capsule Place 18 mcg into inhaler and inhale daily.      Marland Kitchen topiramate (TOPAMAX) 100 MG tablet Take 100 mg by mouth daily.      Marland Kitchen  vitamin E 1000 UNIT capsule Take 3 by mouth daily       No current facility-administered medications for this visit.    BP 128/76  Pulse 76  Ht 6\' 2"  (1.88 m)  Wt 132.904 kg (293 lb)  BMI 37.6 kg/m2 General: NAD, obese Neck: thick, no JVD, no thyromegaly or thyroid nodule.  Lungs: Clear to auscultation bilaterally with normal respiratory effort. CV: Nondisplaced PMI.  Heart regular S1/S2, no S3/S4, no murmur.  No peripheral edema.  No carotid bruit.  Normal pedal pulses.  Abdomen: Soft, nontender, no hepatosplenomegaly, no distention.  Skin: Intact without lesions or rashes.  Neurologic: Alert and oriented x 3.  Psych: Normal affect. Extremities: No clubbing or cyanosis.  HEENT: Normal.   Assessment/Plan: 1. Exertional dyspnea: Gradual onset, now quite significant and limiting.  No chest pain.  He has a strong family history of premature CAD, HTN, and hyperlipidemia.  He stopped smoking in 2012.  He does have COPD, so the symptoms could be related to COPD.  I think it would be reasonable  to risk stratify him for coronary disease.  I will arrange for Lexiscan-Cardiolite  (back pain limits walking).  I will also do an echocardiogram to assess LV and RV size and function.  He should continue ASA.  2. HTN: BP appears controlled.  3. Hyperlipidemia: Lipids were done today at PCP's office.  I will review.  Low threshold to restart statin.  Given myalgias on statin years ago, I will likely start a low dose of Crestor (5 mg every other day) to begin with with lipids/LFTs in 2 months.    Marca Ancona 05/29/2013

## 2013-06-11 ENCOUNTER — Telehealth: Payer: Self-pay

## 2013-06-11 NOTE — Telephone Encounter (Signed)
Pt said has had sleep study at [facility] and wants to know what needs to do to get Dr Dayton Martes the records. Advised pt can sign record release here at our office or can contact [facility]. Pt said he will contact [facility] and if needed will sign release at our office. Pt also needed lab appt date for Dr Shirlee Latch' requested labs. Advised pt 07/24/13 at 8:30 and pt will come fasting.

## 2013-06-17 ENCOUNTER — Ambulatory Visit (HOSPITAL_COMMUNITY): Payer: BC Managed Care – PPO | Attending: Cardiovascular Disease | Admitting: Radiology

## 2013-06-17 ENCOUNTER — Ambulatory Visit (HOSPITAL_BASED_OUTPATIENT_CLINIC_OR_DEPARTMENT_OTHER): Payer: BC Managed Care – PPO | Admitting: Radiology

## 2013-06-17 VITALS — BP 118/72 | HR 52 | Ht 74.0 in | Wt 288.0 lb

## 2013-06-17 DIAGNOSIS — R0602 Shortness of breath: Secondary | ICD-10-CM

## 2013-06-17 DIAGNOSIS — I10 Essential (primary) hypertension: Secondary | ICD-10-CM | POA: Insufficient documentation

## 2013-06-17 DIAGNOSIS — G4733 Obstructive sleep apnea (adult) (pediatric): Secondary | ICD-10-CM | POA: Insufficient documentation

## 2013-06-17 DIAGNOSIS — R0989 Other specified symptoms and signs involving the circulatory and respiratory systems: Secondary | ICD-10-CM | POA: Insufficient documentation

## 2013-06-17 DIAGNOSIS — Z8249 Family history of ischemic heart disease and other diseases of the circulatory system: Secondary | ICD-10-CM | POA: Insufficient documentation

## 2013-06-17 DIAGNOSIS — F17200 Nicotine dependence, unspecified, uncomplicated: Secondary | ICD-10-CM | POA: Insufficient documentation

## 2013-06-17 DIAGNOSIS — J4489 Other specified chronic obstructive pulmonary disease: Secondary | ICD-10-CM | POA: Insufficient documentation

## 2013-06-17 DIAGNOSIS — R0609 Other forms of dyspnea: Secondary | ICD-10-CM | POA: Insufficient documentation

## 2013-06-17 DIAGNOSIS — E785 Hyperlipidemia, unspecified: Secondary | ICD-10-CM | POA: Insufficient documentation

## 2013-06-17 DIAGNOSIS — J449 Chronic obstructive pulmonary disease, unspecified: Secondary | ICD-10-CM | POA: Insufficient documentation

## 2013-06-17 MED ORDER — TECHNETIUM TC 99M SESTAMIBI GENERIC - CARDIOLITE
30.0000 | Freq: Once | INTRAVENOUS | Status: AC | PRN
  Administered 2013-06-17: 30 via INTRAVENOUS

## 2013-06-17 MED ORDER — REGADENOSON 0.4 MG/5ML IV SOLN
0.4000 mg | Freq: Once | INTRAVENOUS | Status: AC
  Administered 2013-06-17: 0.4 mg via INTRAVENOUS

## 2013-06-17 MED ORDER — TECHNETIUM TC 99M SESTAMIBI GENERIC - CARDIOLITE
10.0000 | Freq: Once | INTRAVENOUS | Status: AC | PRN
  Administered 2013-06-17: 10 via INTRAVENOUS

## 2013-06-17 NOTE — Progress Notes (Addendum)
[facility] SITE 3 NUCLEAR MED [address] [place], [place] [address] [phone]    Cardiology Nuclear Med Study  Corey Bell is a 47 y.o. male     MRN : [id number]     DOB: 07/20/1966  Procedure Date: 06/17/2013  Nuclear Med Background Indication for Stress Test:  Evaluation for Ischemia History: No prior known history of CAD Cardiac Risk Factors: Strong, Premature Family History - CAD, History of Smoking, Hypertension, Obesity and Lipids  Symptoms:  DOE, Fatigue and Fatigue with Exertion   Nuclear Pre-Procedure Caffeine/Decaff Intake:  None > 12 hrs NPO After: 7:00pm   Lungs:  Clear. O2 Sat: 96% on room air. IV 0.9% NS with Angio Cath:  20g  IV Site: R Antecubital x 1, tolerated well IV Started by:  Irean Hong, RN  Chest Size (in):  52 Cup Size: n/a  Height: 6\' 2"  (1.88 m)  Weight:  288 lb (130.636 kg)  BMI:  Body mass index is 36.96 kg/(m^2). Tech Comments:  Metoprolol held x 24 hours    Nuclear Med Study 1 or 2 day study: 1 day  Stress Test Type:  Lexiscan  Reading MD: Charlton Haws, MD  Order Authorizing Provider:  Marca Ancona, MD  Resting Radionuclide: Technetium 11m Sestamibi  Resting Radionuclide Dose: 11.0 mCi   Stress Radionuclide:  Technetium 63m Sestamibi  Stress Radionuclide Dose: 33.0 mCi           Stress Protocol Rest HR: 52 Stress HR: 62  Rest BP: 118/72 Stress BP: 120/81  Exercise Time (min): n/a METS: n/a   Predicted Max HR: 174 bpm % Max HR: 35.63 bpm Rate Pressure Product: 7440   Dose of Adenosine (mg):  n/a Dose of Lexiscan: 0.4 mg  Dose of Atropine (mg): n/a Dose of Dobutamine: n/a mcg/kg/min (at max HR)  Stress Test Technologist: Smiley Houseman, CMA-N  Nuclear Technologist:  Domenic Polite, CNMT     Rest Procedure:  Myocardial perfusion imaging was performed at rest 45 minutes following the intravenous administration of Technetium 60m Sestamibi.  Rest ECG: NSR - Normal EKG  Stress Procedure:  The patient received IV  Lexiscan 0.4 mg over 15-seconds.  Technetium 49m Sestamibi injected at 30-seconds.  He c/o headache and shortness of breath with Lexiscan.  Quantitative spect images were obtained after a 45 minute delay.  Stress ECG: No significant change from baseline ECG  QPS Raw Data Images:  Normal; no motion artifact; normal heart/lung ratio. Stress Images:  Normal homogeneous uptake in all areas of the myocardium. Rest Images:  Normal homogeneous uptake in all areas of the myocardium. Subtraction (SDS):  Normal Transient Ischemic Dilatation (Normal <1.22):  n/a Lung/Heart Ratio (Normal <0.45):  0.52  Quantitative Gated Spect Images QGS EDV:  130 ml QGS ESV:  57 ml  Impression Exercise Capacity:  Lexiscan with no exercise. BP Response:  Normal blood pressure response. Clinical Symptoms:  There is dyspnea. ECG Impression:  No significant ST segment change suggestive of ischemia. Comparison with Prior Nuclear Study: No previous nuclear study performed  Overall Impression:  Normal stress nuclear study.  LV Ejection Fraction: 56%.  LV Wall Motion:  NL LV Function; NL Wall Motion   Charlton Haws  Normal study, please inform patient.   Marca Ancona 06/19/2013

## 2013-06-17 NOTE — Progress Notes (Signed)
Echocardiogram performed.  

## 2013-06-19 ENCOUNTER — Telehealth: Payer: Self-pay | Admitting: *Deleted

## 2013-06-19 NOTE — Telephone Encounter (Signed)
LMTCB 06/19/13 @ 5:45pm/PE

## 2013-06-20 NOTE — Progress Notes (Signed)
Pt.notified

## 2013-06-20 NOTE — Telephone Encounter (Signed)
Reviewed nuclear stress test results with patient who verbalized understanding and gratitude

## 2013-06-26 ENCOUNTER — Encounter: Payer: Self-pay | Admitting: Family Medicine

## 2013-07-01 ENCOUNTER — Telehealth: Payer: Self-pay

## 2013-07-01 ENCOUNTER — Ambulatory Visit (INDEPENDENT_AMBULATORY_CARE_PROVIDER_SITE_OTHER): Payer: BC Managed Care – PPO | Admitting: Family Medicine

## 2013-07-01 ENCOUNTER — Encounter: Payer: Self-pay | Admitting: Family Medicine

## 2013-07-01 VITALS — BP 124/82 | HR 60 | Temp 98.1°F | Ht 73.25 in | Wt 291.5 lb

## 2013-07-01 DIAGNOSIS — R251 Tremor, unspecified: Secondary | ICD-10-CM

## 2013-07-01 DIAGNOSIS — G4733 Obstructive sleep apnea (adult) (pediatric): Secondary | ICD-10-CM | POA: Insufficient documentation

## 2013-07-01 DIAGNOSIS — M5136 Other intervertebral disc degeneration, lumbar region: Secondary | ICD-10-CM

## 2013-07-01 DIAGNOSIS — Z0000 Encounter for general adult medical examination without abnormal findings: Secondary | ICD-10-CM

## 2013-07-01 DIAGNOSIS — E039 Hypothyroidism, unspecified: Secondary | ICD-10-CM

## 2013-07-01 DIAGNOSIS — Z8249 Family history of ischemic heart disease and other diseases of the circulatory system: Secondary | ICD-10-CM

## 2013-07-01 DIAGNOSIS — E785 Hyperlipidemia, unspecified: Secondary | ICD-10-CM

## 2013-07-01 DIAGNOSIS — M5137 Other intervertebral disc degeneration, lumbosacral region: Secondary | ICD-10-CM

## 2013-07-01 DIAGNOSIS — G894 Chronic pain syndrome: Secondary | ICD-10-CM

## 2013-07-01 DIAGNOSIS — I10 Essential (primary) hypertension: Secondary | ICD-10-CM

## 2013-07-01 DIAGNOSIS — Z125 Encounter for screening for malignant neoplasm of prostate: Secondary | ICD-10-CM

## 2013-07-01 LAB — HEPATIC FUNCTION PANEL
Albumin: 4.4 g/dL (ref 3.5–5.2)
Alkaline Phosphatase: 61 U/L (ref 39–117)
Total Bilirubin: 0.9 mg/dL (ref 0.3–1.2)
Total Protein: 7.5 g/dL (ref 6.0–8.3)

## 2013-07-01 LAB — LIPID PANEL
Cholesterol: 220 mg/dL — ABNORMAL HIGH (ref 0–200)
VLDL: 44.4 mg/dL — ABNORMAL HIGH (ref 0.0–40.0)

## 2013-07-01 LAB — PSA: PSA: 0.31 ng/mL (ref 0.10–4.00)

## 2013-07-01 LAB — LDL CHOLESTEROL, DIRECT: Direct LDL: 149.5 mg/dL

## 2013-07-01 MED ORDER — BUPROPION HCL ER (XL) 300 MG PO TB24
300.0000 mg | ORAL_TABLET | Freq: Every day | ORAL | Status: DC
Start: 2013-07-01 — End: 2013-11-12

## 2013-07-01 MED ORDER — HYDROCORTISONE 2.5 % RE CREA
TOPICAL_CREAM | RECTAL | Status: DC
Start: 2013-07-01 — End: 2013-11-21

## 2013-07-01 NOTE — Telephone Encounter (Signed)
Noted  Referral placed.

## 2013-07-01 NOTE — Patient Instructions (Addendum)
Good to see you. We will call you with your lab results.  We are increasing your Wellbutrin to 300 mg XL daily (morning).  I have also send in a prescription anusol cream to use as needed.  Hemorrhoids Hemorrhoids are swollen veins around the rectum or anus. There are two types of hemorrhoids:   Internal hemorrhoids. These occur in the veins just inside the rectum. They may poke through to the outside and become irritated and painful.  External hemorrhoids. These occur in the veins outside the anus and can be felt as a painful swelling or hard lump near the anus. CAUSES  Pregnancy.   Obesity.   Constipation or diarrhea.   Straining to have a bowel movement.   Sitting for long periods on the toilet.  Heavy lifting or other activity that caused you to strain.  Anal intercourse. SYMPTOMS   Pain.   Anal itching or irritation.   Rectal bleeding.   Fecal leakage.   Anal swelling.   One or more lumps around the anus.  DIAGNOSIS  Your caregiver may be able to diagnose hemorrhoids by visual examination. Other examinations or tests that may be performed include:   Examination of the rectal area with a gloved hand (digital rectal exam).   Examination of anal canal using a small tube (scope).   A blood test if you have lost a significant amount of blood.  A test to look inside the colon (sigmoidoscopy or colonoscopy). TREATMENT Most hemorrhoids can be treated at home. However, if symptoms do not seem to be getting better or if you have a lot of rectal bleeding, your caregiver may perform a procedure to help make the hemorrhoids get smaller or remove them completely. Possible treatments include:   Placing a rubber band at the base of the hemorrhoid to cut off the circulation (rubber band ligation).   Injecting a chemical to shrink the hemorrhoid (sclerotherapy).   Using a tool to burn the hemorrhoid (infrared light therapy).   Surgically removing the  hemorrhoid (hemorrhoidectomy).   Stapling the hemorrhoid to block blood flow to the tissue (hemorrhoid stapling).  HOME CARE INSTRUCTIONS   Eat foods with fiber, such as whole grains, beans, nuts, fruits, and vegetables. Ask your doctor about taking products with added fiber in them (fibersupplements).  Increase fluid intake. Drink enough water and fluids to keep your urine clear or pale yellow.   Exercise regularly.   Go to the bathroom when you have the urge to have a bowel movement. Do not wait.   Avoid straining to have bowel movements.   Keep the anal area dry and clean. Use wet toilet paper or moist towelettes after a bowel movement.   Medicated creams and suppositories may be used or applied as directed.   Only take over-the-counter or prescription medicines as directed by your caregiver.   Take warm sitz baths for 15 20 minutes, 3 4 times a day to ease pain and discomfort.   Place ice packs on the hemorrhoids if they are tender and swollen. Using ice packs between sitz baths may be helpful.   Put ice in a plastic bag.   Place a towel between your skin and the bag.   Leave the ice on for 15 20 minutes, 3 4 times a day.   Do not use a donut-shaped pillow or sit on the toilet for long periods. This increases blood pooling and pain.  SEEK MEDICAL CARE IF:  You have increasing pain and swelling that is  not controlled by treatment or medicine.  You have uncontrolled bleeding.  You have difficulty or you are unable to have a bowel movement.  You have pain or inflammation outside the area of the hemorrhoids. MAKE SURE YOU:  Understand these instructions.  Will watch your condition.  Will get help right away if you are not doing well or get worse. Document Released: 09/22/2000 Document Revised: 09/11/2012 Document Reviewed: 07/30/2012 [facility] Patient Information 2014 [place], [place].

## 2013-07-01 NOTE — Progress Notes (Signed)
Subjective:    Patient ID: Corey Bell, male    DOB: [birth year], 47 y.o.   MRN: [id number]  HPI  47 yo male who established care with me last month, here for CPX.  Peyronie's disease- followed by Dr. Ronal Fear who had him on flomax but pt stopped taking it. Just saw him this summer. Per pt, PSA was not checked.  No family ho prostate CA. Does have some symptoms of nocturia. Has has follow up with Dr. Ronal Fear next month.  COPD- on Advair, prn xopenex.    Was on ventolin but felt it was causing tremors.  Those tremors have not yet resolved.  OSA- followed by Dr. Arlis Porta in [place].  Just had sleep study.  Has appt today with Dr. Arlis Porta to get fitted for and pick up his CPAP.   H/o hypothyroidism- on Synthroid 75 mcg daily.   Lab Results  Component Value Date   TSH 1.41 05/28/2013    Strong FH of CAD- saw Dr. Shirlee Latch.  Started on Crestor. Two brothers with premature CAD. He is a former smoker- smoked 2 ppd but quit 6 years ago.   Normal stress test last week:  Overall Impression: Normal stress nuclear study.  LV Ejection Fraction: 56%. LV Wall Motion: NL LV Function; NL Wall Motion    Back pain- chronic issue.  S/p L5/S1 surgery.  Not working due to this pain.  Depression- has been under more stressors.  His wife has a lot of physical and emotional issues.  She "threatens to leave me all the time." On Wellbutrin 150 SR daily.  Denies any anxiety.  No SI or HI. Feels more "down."  Painful area around anus- notices it when he wipes.  No rectal bleeding.   Patient Active Problem List   Diagnosis Date Noted  . Routine general medical examination at a health care facility 07/01/2013  . OSA (obstructive sleep apnea) 07/01/2013  . Essential hypertension 05/29/2013  . Hyperlipidemia 05/29/2013  . Exertional dyspnea 05/29/2013  . Family history of heart disease 05/28/2013  . Depression 05/28/2013  . Tremor 05/28/2013  . Anxiety   . Lumbar degenerative disc disease   .  Chronic pain syndrome   . Hypothyroidism   . 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 Carpal tunnel syndrome, bilateral   . H/O acute prostatitis   . Unspecified essential hypertension   . Diverticulitis of colon   . Esophageal reflux   . Mixed hyperlipidemia   . Penile mass 10-04-12  . 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 Seasonal allergies   . Aphthous ulcer 10-04-12    improving  . Chronic pain syndrome   . Insomnia, unspecified   . Tympanic membrane perforation     history of  . Anxiety   . Lumbar degenerative disc disease   . 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 Hypothyroidism    Past Surgical History  Procedure Laterality Date  . Back surgery    . Tympanostomy tube placement  1975  . Knee surgery      torn meniscus   History  Substance Use Topics  . Smoking status: Former Smoker -- 1.50 packs/day for 30 years    Types: Cigarettes  . Smokeless tobacco: Never Used  . Alcohol Use: Yes     Comment: rarely   Family History  Problem Relation Age of Onset  . Bladder Cancer Paternal Uncle   . Renal cancer Paternal Uncle  renal cell carcinoma  . Congestive Heart Failure Father   . Heart disease Father   . COPD Father   . Heart disease Brother   . Heart disease Brother     MI at 85  . Renal cancer Paternal Grandfather     renal call carcinoma  . Heart disease Brother     stents at 50 yo   Allergies  Allergen Reactions  . Eggs Or Egg-Derived Products Nausea Only  . Other     Mammalian meats   . Statins Rash  . Sulfa Antibiotics Rash   Current Outpatient Prescriptions on File Prior to Visit  Medication Sig Dispense Refill  . aspirin 325 MG tablet Take 2 by mouth daily      . Beclomethasone Diprop, Nasal, (QNASL NA) 2 sprays each nostril daily      . buPROPion (WELLBUTRIN SR) 150 MG 12 hr tablet Take 150 mg by mouth daily.      . fexofenadine (ALLEGRA) 180 MG tablet Take 180 mg by mouth daily.      . Fluticasone-Salmeterol  (ADVAIR) 100-50 MCG/DOSE AEPB Inhale 1 puff into the lungs every 12 (twelve) hours.      . gabapentin (NEURONTIN) 400 MG capsule Take 6 by mouth daily      . levalbuterol (XOPENEX HFA) 45 MCG/ACT inhaler Inhale 1-2 puffs into the lungs every 4 (four) hours as needed for wheezing.  1 Inhaler  12  . levothyroxine (SYNTHROID, LEVOTHROID) 75 MCG tablet Take 75 mcg by mouth daily before breakfast.      . lisinopril (PRINIVIL,ZESTRIL) 10 MG tablet Take 10 mg by mouth daily.      . Melatonin 5 MG TABS Take by mouth as needed      . meloxicam (MOBIC) 15 MG tablet Take one to two tablets by mouth daily      . metoprolol succinate (TOPROL-XL) 50 MG 24 hr tablet Take 50 mg by mouth daily. Take with or immediately following a meal.      . montelukast (SINGULAIR) 10 MG tablet Take 10 mg by mouth at bedtime.      . niacin 500 MG tablet Take 500 mg by mouth daily with breakfast.      . rosuvastatin (CRESTOR) 5 MG tablet Take 1 tablet every other day for 2 weeks, then increase to 1 tablet daily  30 tablet  3  . tiotropium (SPIRIVA) 18 MCG inhalation capsule Place 18 mcg into inhaler and inhale daily.      Marland Kitchen topiramate (TOPAMAX) 100 MG tablet Take 100 mg by mouth daily.      . vitamin E 1000 UNIT capsule Take 3 by mouth daily       No current facility-administered medications on file prior to visit.   The PMH, PSH, Social History, Family History, Medications, and allergies have been reviewed in [facility], and have been updated if relevant.   Review of Systems See HPI +anxiety No HA No CP, SOB or DOE     Objective:   Physical Exam BP 124/82  Pulse 60  Temp(Src) 98.1 F (36.7 C) (Oral)  Ht 6' 1.25" (1.861 m)  Wt 291 lb 8 oz (132.224 kg)  BMI 38.18 kg/m2 General:  overweght male in NAD Eyes:  PERRL Ears:  External ear exam shows no significant lesions or deformities.  Otoscopic examination reveals clear canals, tympanic membranes are intact bilaterally without bulging, retraction, inflammation or  discharge. Hearing is grossly normal bilaterally. Nose:  External nasal examination shows no deformity or  inflammation. Nasal mucosa are pink and moist without lesions or exudates. Mouth:  Oral mucosa and oropharynx without lesions or exudates.  Teeth in good repair. Neck:  no carotid bruit or thyromegaly no cervical or supraclavicular lymphadenopathy  Lungs:  Normal respiratory effort, chest expands symmetrically. Lungs are clear to auscultation, no crackles or wheezes. Heart:  Normal rate and regular rhythm. S1 and S2 normal without gallop, murmur, click, rub or other extra sounds. Abdomen:  Bowel sounds positive,abdomen soft and non-tender without masses, organomegaly or hernias noted. Pulses:  R and L posterior tibial pulses are full and equal bilaterally  Extremities:  no edema  Rectal:  Small external, non thrombosed hemorrhoid     Assessment & Plan:   1. Family history of heart disease Neg stress test. Followed by cards.  2. Hyperlipidemia On statin.  Recheck fasting lipids today.  3. Hypothyroidism Stable.  No changes. Lab Results  Component Value Date   TSH 1.41 05/28/2013     4. Essential hypertension Well controlled.  5. Lumbar degenerative disc disease Chronic, no changes.  6. Routine general medical examination at a health care facility Reviewed preventive care protocols, scheduled due services, and updated immunizations Discussed nutrition, exercise, diet, and healthy lifestyle.   7. OSA (obstructive sleep apnea) Will be set up with CPAP today.  8. Hemorrhoid- discussed supportive care- see AVS.

## 2013-07-01 NOTE — Telephone Encounter (Signed)
Pt left v/m; pt was seen this morning and pt does want referral to neurologist. Please advise.

## 2013-07-02 ENCOUNTER — Encounter: Payer: Self-pay | Admitting: *Deleted

## 2013-07-07 ENCOUNTER — Telehealth: Payer: Self-pay | Admitting: Cardiology

## 2013-07-07 DIAGNOSIS — E785 Hyperlipidemia, unspecified: Secondary | ICD-10-CM

## 2013-07-07 DIAGNOSIS — I10 Essential (primary) hypertension: Secondary | ICD-10-CM

## 2013-07-07 MED ORDER — ATORVASTATIN CALCIUM 10 MG PO TABS: 10.0000 mg | ORAL_TABLET | Freq: Every day | ORAL | Status: DC

## 2013-07-07 NOTE — Telephone Encounter (Signed)
Spoke with patient about recent lab results 

## 2013-07-07 NOTE — Telephone Encounter (Signed)
Follow up  Returning a call about results//

## 2013-07-07 NOTE — Telephone Encounter (Signed)
PT RTN CALL RE LAB RESULTS

## 2013-07-07 NOTE — Telephone Encounter (Signed)
lmtcb

## 2013-07-17 ENCOUNTER — Encounter: Payer: Self-pay | Admitting: Neurology

## 2013-07-17 ENCOUNTER — Ambulatory Visit (INDEPENDENT_AMBULATORY_CARE_PROVIDER_SITE_OTHER): Payer: BC Managed Care – PPO | Admitting: Neurology

## 2013-07-17 VITALS — BP 114/72 | HR 56 | Ht 73.0 in | Wt 292.0 lb

## 2013-07-17 DIAGNOSIS — G4733 Obstructive sleep apnea (adult) (pediatric): Secondary | ICD-10-CM

## 2013-07-17 DIAGNOSIS — R251 Tremor, unspecified: Secondary | ICD-10-CM

## 2013-07-17 DIAGNOSIS — R259 Unspecified abnormal involuntary movements: Secondary | ICD-10-CM

## 2013-07-17 NOTE — Patient Instructions (Signed)
Overall you are doing fairly well but I do want to suggest a few things today:   I suspect your tremor is likely related to your COPD medications. Another possible cause is an Essential Tremor.   As far as your medications are concerned, I would like to suggest discussing if there are COPD alternatives with your prescribing physician  We will hold off on starting any medication for the tremor at this time. Please continue to use your CPAP   I would like to see you back in 6months, sooner if we need to. Please call us with any interim questions, concerns, problems, updates or refill requests.    My clinical assistant and will answer any of your questions and relay your messages to me and also relay most of my messages to you.   Our phone number is [phone]. We also have an after hours call service for urgent matters and there is a physician on-call for urgent questions. For any emergencies you know to call 911 or go to the nearest emergency room

## 2013-07-17 NOTE — Progress Notes (Signed)
[facility]    Provider:  Dr Hosie Poisson Referring Provider: Dianne Dun, MD Primary Care Physician:  Ruthe Mannan, MD  CC:  tremor  HPI:  Corey Bell is a 47 y.o. male here as a referral from Dr. Dayton Martes for tremor  [name], is not there in the morning, comes around lunch time and gets worse as day goes on. Notices it the nmost when trying to hold a book. Wife notices a very fine rest tremor but is much more a predominantly action and postural tremor. Difficulty eating, [name], [name], [name], [name]   Father has "tremors", started in his 48s.   Has RBD and sleep apnea, recently started CPAP which has improved symptoms.   Review of Systems: Out of a complete 14 system review, the patient complains of only the following symptoms, and all other reviewed systems are negative. Positive for shortness of breath snoring tremor depression anxiety  History   Social History  . Marital Status: Married    Spouse Name: Corey Bell    Number of Children: 0  . Years of Education: college   Occupational History  . MAINTENANCE SUPER    Social History Main Topics  . Smoking status: Former Smoker -- 1.50 packs/day for 30 years    Types: Cigarettes  . Smokeless tobacco: Never Used  . Alcohol Use: 0.6 oz/week    1 Cans of beer per week     Comment: rarely  . Drug Use: No  . Sexual Activity: Not on file   Other Topics Concern  . Not on file   Social History Narrative   Married.  Corey Bell)  No children.  Patient has Chiropodist.   In process of getting disability for low back pain.   Right handed.   Caffeine- Coffee a pot but not every day.    Family History  Problem Relation Age of Onset  . Bladder Cancer Paternal Uncle   . Renal cancer Paternal Uncle     renal cell carcinoma  . Congestive Heart Failure Father   . Heart disease Father   . COPD Father   . Heart disease Brother   . Heart disease Brother     MI at 44  . Renal cancer Paternal Grandfather     renal call carcinoma  . Heart disease Brother     stents at 70 yo    Past Medical History  Diagnosis Date  . Carpal tunnel syndrome, bilateral   . H/O acute prostatitis   . Unspecified essential hypertension   . Diverticulitis of colon   . Esophageal reflux   . Mixed hyperlipidemia   . Penile mass 10-04-12  . 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 Seasonal allergies   . Aphthous ulcer 10-04-12    improving  . Chronic pain syndrome   . Insomnia, unspecified   . Tympanic membrane perforation     history of  . Anxiety   . Lumbar degenerative disc disease   . 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 Hypothyroidism     Past  Surgical History  Procedure Laterality Date  . Back surgery    . Tympanostomy tube placement  1975  . Knee surgery      torn meniscus    Current Outpatient Prescriptions  Medication Sig Dispense Refill  . aspirin 325 MG tablet Take 2 by mouth daily      . atorvastatin (LIPITOR) 10 MG tablet Take 1 tablet (10 mg total) by mouth daily.  30 tablet  3  . Beclomethasone Diprop, Nasal, (QNASL NA) 2 sprays each nostril daily      . buPROPion (WELLBUTRIN XL) 300 MG 24 hr tablet Take 1 tablet (300 mg total) by mouth daily.  30 tablet  3  . fexofenadine (ALLEGRA) 180 MG tablet Take 180 mg by mouth daily.      . Fluticasone-Salmeterol (ADVAIR) 100-50 MCG/DOSE AEPB Inhale 1 puff into the lungs daily.       Marland Kitchen gabapentin (NEURONTIN) 400 MG capsule Take 400 mg by mouth. Take 4 by mouth daily      .  hydrocortisone (ANUSOL-HC) 2.5 % rectal cream Apply to rectal area twice as needed. Do not use for more than 2 weeks consecutively without calling doctor.  30 g  0  . levalbuterol (XOPENEX HFA) 45 MCG/ACT inhaler Inhale 1-2 puffs into the lungs every 4 (four) hours as needed for wheezing.  1 Inhaler  12  . levothyroxine (SYNTHROID, LEVOTHROID) 75 MCG tablet Take 75 mcg by mouth daily before breakfast.      . lisinopril (PRINIVIL,ZESTRIL) 10 MG tablet Take 10 mg by mouth daily.      . Melatonin 5 MG TABS Take by mouth as needed      . meloxicam (MOBIC) 15 MG tablet Take one to two tablets by mouth daily      . metoprolol succinate (TOPROL-XL) 50 MG 24 hr tablet Take 50 mg by mouth daily. Take with or immediately following a meal.      . montelukast (SINGULAIR) 10 MG tablet Take 10 mg by mouth at bedtime.      Marland Kitchen tiotropium (SPIRIVA) 18 MCG inhalation capsule Place 18 mcg into inhaler and inhale daily.      Marland Kitchen topiramate (TOPAMAX) 100 MG tablet Take 100 mg by mouth daily.      . vitamin E 1000 UNIT capsule Take 3 by mouth daily       No current facility-administered medications for this visit.    Allergies as of 07/17/2013 - Review Complete 07/17/2013  Allergen Reaction Noted  . Demerol [meperidine]  07/01/2013  . Eggs or egg-derived products Nausea Only   . Statins Rash   . Sulfa antibiotics Rash     Vitals: BP 114/72  Pulse 56  Ht 6\' 1"  (1.854 m)  Wt 292 lb (132.45 kg)  BMI 38.53 kg/m2 Last Weight:  Wt Readings from Last 1 Encounters:  07/17/13 292 lb (132.45 kg)   Last Height:   Ht Readings from Last 1 Encounters:  07/17/13 6\' 1"  (1.854 m)     Physical exam: Exam: Gen: NAD, conversant Eyes: anicteric sclerae, moist conjunctivae HENT: Atraumatic, oropharynx clear Neck: Trachea midline; supple,  Lungs: CTA, no wheezing, rales, rhonic                          CV: RRR, no MRG Abdomen: Soft, non-tender;  Extremities: No peripheral edema  Skin: Normal temperature, no rash,    Psych: Appropriate affect, pleasant  Neuro: MS: AA&Ox3, appropriately interactive, normal affect  Speech: fluent w/o paraphasic error  Memory: good recent and remote recall  CN: PERRL, EOMI no nystagmus, no ptosis, sensation intact to LT V1-V3 bilat, face symmetric, no weakness, hearing grossly intact, palate elevates symmetrically, shoulder shrug 5/5 bilat,  tongue protrudes midline, no fasiculations noted.  Motor: normal bulk and tone Strength: 5/5  In all extremities  Coord: rapid alternating and point-to-point (FNF, HTS) movements intact. No rest tremor noted, mild bilat UE intention and postural tremor R>L  Mild bradykinesia with finger tapping L>R, bradykinesia with open/close hand on left  Reflexes: symmetrical, bilat downgoing toes  Sens: LT intact in all extremities  Gait: posture, stance, stride normal, mild decrease arm swing on R   Assessment:  After physical and neurologic examination, review of laboratory studies, imaging, neurophysiology testing and pre-existing records, assessment will be reviewed on the problem list.  Plan:  Treatment plan and additional workup will be reviewed under Problem List.  Mr Glogowski is a pleasant 47y/o presenting for initial evaluation of tremor. Is predominantly a postural/intention tremor. Differential includes ET vs medication induced. Patient does have some bradykinesia and decreased arm swing but otherwise no signs of parkinsonism on exam.   1)Tremor 2)OSA  -will monitor for now, patient to work on improved sleep pattern and reduce stress -can consider primidone in the future if symptoms worsen -discuss with PCP change in COPD medications if tolerated -f/u 6 months or earlier if needed

## 2013-07-24 ENCOUNTER — Other Ambulatory Visit: Payer: BC Managed Care – PPO

## 2013-08-01 ENCOUNTER — Other Ambulatory Visit: Payer: BC Managed Care – PPO

## 2013-08-06 ENCOUNTER — Other Ambulatory Visit: Payer: Self-pay | Admitting: Urology

## 2013-08-19 ENCOUNTER — Encounter (HOSPITAL_BASED_OUTPATIENT_CLINIC_OR_DEPARTMENT_OTHER): Payer: Self-pay | Admitting: *Deleted

## 2013-08-19 NOTE — Progress Notes (Signed)
Pt instructed npo p mn 11/13 x toprol, topamax, gabapentin, welbutrin, protonix, allegra w sip of water.  To [facility] 11/14 @ 0830.  Needs istat on arrival.pt to bring cpap machine and xopenex inhaler w/ him.

## 2013-08-21 NOTE — H&P (Signed)
Corey Bell is a 47 year old male with Peyronie's disease.   History of Present Illness            Left spermatocele: He noted a knot associated with the left testicle was nontender in 5/09. A scrotal ultrasound at that time revealed no abnormality the testis and a left spermatocele.  History of microscopic hematuria: This was noted in 1/98. His primary care physician at that time obtain an IVP and a renal ultrasound both which were found to be normal.   History of prostatitis: I saw him for this in 1/99. He responded to antibiotics.  Peyronie's disease: He began having pain with erection in 12/13 with dorsal curvature. Normal erections.  Abnormal urinary stream: He has noted since he underwent back surgery that he has developed spraying/splitting of his urinary stream. He said sometimes it will be deflected one direction and another times a different direction. The stream is still forceful and a trial of alpha blockade therapy resulted in no improvement.  BPH with LUTS: He was having spraying and splitting of his urinary stream. A trial of Flomax resulted in no significant improvement and retrograde ejaculation therefore he stopped the medication.   Interval history: He has returned to discuss surgical options for treatment of his Peyronie's disease further. He reports that the spring and splitting of his urinary stream have resolved with a opening of the urethral meatus manually prior to urination each time. He would like to proceed with a vasectomy at the time of surgery if that was possible.    Past Medical History Problems  1. History of  Anxiety (Symptom) 300.00 2. History of  Arthritis V13.4 3. History of  Depression 311 4. History of  Epididymitis 604.90 5. Former Smoker 6. History of  Heartburn 787.1 7. History of  Hypercholesterolemia 272.0 8. History of  Hypertension 401.9 9. History of  Nephrolithiasis V13.01 10. History of  Urine Stream Sprays Or Splits 788.61  Surgical  History Problems  1. History of  Arthroscopy Knee Left 2. History of  Back Surgery 3. History of  Ear Surgery Eustachian Tube Bilateral 4. History of  Knee Surgery  Current Meds 1. Advair Diskus AEPB; Therapy: (Recorded:09May2014) to 2. Aspirin 325 MG Oral Tablet; Therapy: (Recorded:11Feb2014) to 3. BuPROPion HCl ER (SR) 150 MG Oral Tablet Extended Release 12 Hour; Therapy:  (Recorded:11Feb2014) to 4. Fexofenadine HCl 180 MG Oral Tablet; Therapy: (Recorded:11Feb2014) to 5. Gabapentin 400 MG Oral Capsule; Therapy: (Recorded:11Feb2014) to 6. Levoxyl 75 MCG Oral Tablet; Therapy: (Recorded:11Feb2014) to 7. Lisinopril 10 MG Oral Tablet; Therapy: (Recorded:11Feb2014) to 8. Melatonin 5 MG Oral Capsule; Therapy: (Recorded:11Feb2014) to 9. Meloxicam 15 MG Oral Tablet; Therapy: (Recorded:11Feb2014) to 10. Metoprolol Succinate ER 50 MG Oral Tablet Extended Release 24 Hour; Therapy:   (Recorded:11Feb2014) to 11. Qnasl 80 MCG/ACT Nasal Aerosol Solution; Therapy: (Recorded:09May2014) to 12. Singulair 10 MG Oral Tablet; Therapy: (Recorded:11Feb2014) to 13. Spiriva HandiHaler 18 MCG Inhalation Capsule; Therapy: (Recorded:09May2014) to 14. Tamsulosin HCl 0.4 MG Oral Capsule; Take one tablet daily at bedtime; Therapy:   11Feb2014 to (Evaluate:06Feb2015)  Requested for: 11Feb2014; Last Rx:11Feb2014 15. Topiramate 100 MG Oral Tablet; Therapy: (Recorded:11Feb2014) to 16. Ventolin HFA 108 (90 Base) MCG/ACT Inhalation Aerosol Solution; Therapy:   (Recorded:09May2014) to  Allergies Medication  1. Sulfa Drugs 2. Demerol TABS Non-Medication  3. Eggs  Family History Problems  1. Family history of  Acute Myocardial Infarction V17.3 2. Paternal grandfather's history of  Kidney Cancer V16.51  Social History Problems  1. Caffeine Use all day  2. Marital History - Currently Married Denied  3. History of  Alcohol Use  Review of Systems Genitourinary, constitutional, skin, eye, otolaryngeal,  hematologic/lymphatic, cardiovascular, pulmonary, endocrine, musculoskeletal, gastrointestinal, neurological and psychiatric system(s) were reviewed and pertinent findings if present are noted.  Genitourinary: nocturia and penile pain.  Gastrointestinal: heartburn.  Constitutional: night sweats and feeling tired (fatigue).  ENT: sore throat and sinus problems.  Respiratory: cough.  Musculoskeletal: back pain.  Psychiatric: depression.   Vitals  BMI Calculated: 38.24 BSA Calculated: 2.57 Height: 6 ft 2 in Weight: 298 lb  Blood Pressure: 131 / 76 Temperature: 97.5 F Heart Rate: 63  Physical Exam Constitutional: Well nourished and well developed . No acute distress.  ENT:. The ears and nose are normal in appearance.  Neck: The appearance of the neck is normal and no neck mass is present.  Pulmonary: No respiratory distress and normal respiratory rhythm and effort.  Cardiovascular: Heart rate and rhythm are normal . No peripheral edema.  Abdomen: The abdomen is obese. The abdomen is soft and nontender. No masses are palpated. No CVA tenderness. No hernias are palpable. No hepatosplenomegaly noted.  Genitourinary: Examination of the penis demonstrates no discharge, no masses and a normal meatus. Penile lesion: A single plaque(s) noted. Location: (dorsal, mid shaft, distal shaft). The scrotum is without lesions. The right epididymis is palpably normal and non-tender. The left epididymis is palpably normal and non-tender. The right testis is non-tender and without masses. The left testis is non-tender and without masses.  Lymphatics: The femoral and inguinal nodes are not enlarged or tender.  Skin: Normal skin turgor, no visible rash and no visible skin lesions.  Neuro/Psych:. Mood and affect are appropriate.   Assessment Assessed     I have discussed the procedure again with he and his wife. I answered all of their questions. We discussed the incision used, the risks and complications,  the probability of success in the outpatient nature of the procedure as well as the anticipated postoperative course. He understands and has elected to proceed.  We discussed the fact that the patient must absolutely be sure that he does not want to father a child under any circumstances and was counseled to talk with his partner regarding this decision. We then discussed the potential risks and complications of this procedure, including but not limited to epididymitis with typically transient discomfort and swelling after the procedure, which can occur on one or both sides; sperm granuloma, which was described in detail as being caused by the presence of some sperm leaking out from the severed ends of the vasa, causing an inflammatory reaction and oftentimes resulting in a small round area of scar that may persist; hematoma and bleeding into the scrotum, which can be significant and possibly require a second incision to drain the accumulated blood, as well as how to prevent this by decreased activity level. We discussed superficial skin infection, which is rare, and abscess formation, which is exceedingly rare and may require drainage as well. Finally, we went over recanalization, which can occur in 1 in 2000 cases and how to determine this and prevent this from occurring. The patient was given written information regarding all of these risks and complications. Also, informed of preoperative preparation of shaving the scrotum, and given written information indicating that immediately after the procedure continued birth control needs to be used until 2 semen specimens are noted to be free of sperm.  The spraying and splitting of his urinary stream has resolved.  Plan  1. I'm going to have him stop his aspirin in the meloxicam. 2. He is going to be scheduled for a 16- plication procedure. 3. He would also like to undergo a bilateral vasectomy but will check with his insurance company to see if it is  covered and will be know.

## 2013-08-22 ENCOUNTER — Ambulatory Visit (HOSPITAL_BASED_OUTPATIENT_CLINIC_OR_DEPARTMENT_OTHER): Payer: BC Managed Care – PPO | Admitting: Anesthesiology

## 2013-08-22 ENCOUNTER — Encounter (HOSPITAL_BASED_OUTPATIENT_CLINIC_OR_DEPARTMENT_OTHER): Payer: Self-pay | Admitting: *Deleted

## 2013-08-22 ENCOUNTER — Encounter (HOSPITAL_BASED_OUTPATIENT_CLINIC_OR_DEPARTMENT_OTHER): Payer: BC Managed Care – PPO | Admitting: Anesthesiology

## 2013-08-22 ENCOUNTER — Encounter (HOSPITAL_BASED_OUTPATIENT_CLINIC_OR_DEPARTMENT_OTHER)
Admission: RE | Disposition: A | Discharge status: Home / Self Care | Payer: Self-pay | Source: Ambulatory Visit | Attending: Urology

## 2013-08-22 ENCOUNTER — Ambulatory Visit (HOSPITAL_BASED_OUTPATIENT_CLINIC_OR_DEPARTMENT_OTHER)
Admission: RE | Admit: 2013-08-22 | Discharge: 2013-08-22 | Disposition: A | Discharge status: Home / Self Care | Payer: BC Managed Care – PPO | Source: Ambulatory Visit | Attending: Urology | Admitting: Urology

## 2013-08-22 DIAGNOSIS — E7800 Pure hypercholesterolemia, unspecified: Secondary | ICD-10-CM | POA: Insufficient documentation

## 2013-08-22 DIAGNOSIS — J449 Chronic obstructive pulmonary disease, unspecified: Secondary | ICD-10-CM | POA: Insufficient documentation

## 2013-08-22 DIAGNOSIS — N486 Induration penis plastica: Secondary | ICD-10-CM | POA: Insufficient documentation

## 2013-08-22 DIAGNOSIS — Z302 Encounter for sterilization: Secondary | ICD-10-CM | POA: Insufficient documentation

## 2013-08-22 DIAGNOSIS — Z87891 Personal history of nicotine dependence: Secondary | ICD-10-CM | POA: Insufficient documentation

## 2013-08-22 DIAGNOSIS — I10 Essential (primary) hypertension: Secondary | ICD-10-CM | POA: Insufficient documentation

## 2013-08-22 DIAGNOSIS — N401 Enlarged prostate with lower urinary tract symptoms: Secondary | ICD-10-CM | POA: Insufficient documentation

## 2013-08-22 DIAGNOSIS — E039 Hypothyroidism, unspecified: Secondary | ICD-10-CM | POA: Insufficient documentation

## 2013-08-22 DIAGNOSIS — J4489 Other specified chronic obstructive pulmonary disease: Secondary | ICD-10-CM | POA: Insufficient documentation

## 2013-08-22 DIAGNOSIS — Z79899 Other long term (current) drug therapy: Secondary | ICD-10-CM | POA: Insufficient documentation

## 2013-08-22 DIAGNOSIS — N138 Other obstructive and reflux uropathy: Secondary | ICD-10-CM | POA: Insufficient documentation

## 2013-08-22 HISTORY — PX: VASECTOMY: SHX75

## 2013-08-22 HISTORY — DX: Headache: R51

## 2013-08-22 HISTORY — PX: NESBIT PROCEDURE: SHX2087

## 2013-08-22 HISTORY — DX: Chronic obstructive pulmonary disease, unspecified: J44.9

## 2013-08-22 HISTORY — DX: Sleep apnea, unspecified: G47.30

## 2013-08-22 LAB — POCT I-STAT 4, (NA,K, GLUC, HGB,HCT)
Glucose, Bld: 129 mg/dL — ABNORMAL HIGH (ref 70–99)
HCT: 52 % (ref 39.0–52.0)
Hemoglobin: 17.7 g/dL — ABNORMAL HIGH (ref 13.0–17.0)
Potassium: 4.2 mEq/L (ref 3.5–5.1)
Sodium: 140 mEq/L (ref 135–145)

## 2013-08-22 SURGERY — NESBIT PROCEDURE
Anesthesia: General | Site: Scrotum | Wound class: Clean

## 2013-08-22 MED ORDER — KETOROLAC TROMETHAMINE 30 MG/ML IJ SOLN
15.0000 mg | Freq: Once | INTRAMUSCULAR | Status: DC | PRN
  Filled 2013-08-22: qty 1

## 2013-08-22 MED ORDER — FENTANYL CITRATE 0.05 MG/ML IJ SOLN
INTRAMUSCULAR | Status: DC | PRN
  Administered 2013-08-22: 25 ug via INTRAVENOUS
  Administered 2013-08-22: 50 ug via INTRAVENOUS
  Administered 2013-08-22: 25 ug via INTRAVENOUS
  Administered 2013-08-22: 50 ug via INTRAVENOUS

## 2013-08-22 MED ORDER — PAPAVERINE HCL 30 MG/ML IJ SOLN
INTRAMUSCULAR | Status: DC | PRN
  Administered 2013-08-22: 120 mg via INTRAVENOUS

## 2013-08-22 MED ORDER — BACITRACIN-NEOMYCIN-POLYMYXIN 400-5-5000 EX OINT
TOPICAL_OINTMENT | CUTANEOUS | Status: DC | PRN
  Administered 2013-08-22: 1 via TOPICAL

## 2013-08-22 MED ORDER — PROMETHAZINE HCL 25 MG/ML IJ SOLN
6.2500 mg | INTRAMUSCULAR | Status: DC | PRN
  Filled 2013-08-22: qty 1

## 2013-08-22 MED ORDER — LACTATED RINGERS IV SOLN
INTRAVENOUS | Status: DC
  Administered 2013-08-22 (×2): via INTRAVENOUS
  Filled 2013-08-22: qty 1000

## 2013-08-22 MED ORDER — LIDOCAINE HCL (CARDIAC) 20 MG/ML IV SOLN
INTRAVENOUS | Status: DC | PRN
  Administered 2013-08-22: 100 mg via INTRAVENOUS

## 2013-08-22 MED ORDER — ONDANSETRON HCL 4 MG/2ML IJ SOLN
INTRAMUSCULAR | Status: DC | PRN
  Administered 2013-08-22: 4 mg via INTRAVENOUS

## 2013-08-22 MED ORDER — DEXAMETHASONE SODIUM PHOSPHATE 4 MG/ML IJ SOLN
INTRAMUSCULAR | Status: DC | PRN
  Administered 2013-08-22: 10 mg via INTRAVENOUS

## 2013-08-22 MED ORDER — DEXTROSE 5 % IV SOLN
3.0000 g | INTRAVENOUS | Status: AC
  Administered 2013-08-22: 3 g via INTRAVENOUS
  Filled 2013-08-22: qty 3000

## 2013-08-22 MED ORDER — ATROPINE SULFATE 0.4 MG/ML IJ SOLN
INTRAMUSCULAR | Status: DC | PRN
  Administered 2013-08-22: 0.4 mg via INTRAVENOUS

## 2013-08-22 MED ORDER — FENTANYL CITRATE 0.05 MG/ML IJ SOLN
25.0000 ug | INTRAMUSCULAR | Status: DC | PRN
  Filled 2013-08-22: qty 1

## 2013-08-22 MED ORDER — PROPOFOL 10 MG/ML IV BOLUS
INTRAVENOUS | Status: DC | PRN
  Administered 2013-08-22: 200 mg via INTRAVENOUS

## 2013-08-22 MED ORDER — CEFAZOLIN SODIUM-DEXTROSE 2-3 GM-% IV SOLR
2.0000 g | INTRAVENOUS | Status: DC
  Filled 2013-08-22: qty 50

## 2013-08-22 MED ORDER — BUPIVACAINE-EPINEPHRINE PF 0.5-1:200000 % IJ SOLN
INTRAMUSCULAR | Status: DC | PRN
  Administered 2013-08-22: 6 mL

## 2013-08-22 MED ORDER — EPHEDRINE SULFATE 50 MG/ML IJ SOLN
INTRAMUSCULAR | Status: DC | PRN
  Administered 2013-08-22 (×2): 10 mg via INTRAVENOUS

## 2013-08-22 MED ORDER — OXYCODONE-ACETAMINOPHEN 10-325 MG PO TABS: 1.0000 | ORAL_TABLET | ORAL | Status: DC | PRN

## 2013-08-22 MED ORDER — MIDAZOLAM HCL 5 MG/5ML IJ SOLN
INTRAMUSCULAR | Status: DC | PRN
  Administered 2013-08-22: 2 mg via INTRAVENOUS

## 2013-08-22 SURGICAL SUPPLY — 66 items
BAND RUBBER #16 2-1/2X1/16 STR (MISCELLANEOUS) IMPLANT
BANDAGE CO FLEX L/F 1IN X 5YD (GAUZE/BANDAGES/DRESSINGS) IMPLANT
BANDAGE CO FLEX L/F 2IN X 5YD (GAUZE/BANDAGES/DRESSINGS) IMPLANT
BANDAGE GAUZE ELAST BULKY 4 IN (GAUZE/BANDAGES/DRESSINGS) IMPLANT
BLADE SURG 15 STRL LF DISP TIS (BLADE) ×2 IMPLANT
BLADE SURG 15 STRL SS (BLADE) ×1
BLADE SURG ROTATE 9660 (MISCELLANEOUS) ×3 IMPLANT
BNDG COHESIVE 3X5 TAN STRL LF (GAUZE/BANDAGES/DRESSINGS) ×3 IMPLANT
CANISTER SUCTION 1200CC (MISCELLANEOUS) IMPLANT
CANISTER SUCTION 2500CC (MISCELLANEOUS) IMPLANT
CATH FOLEY 2WAY SLVR  5CC 16FR (CATHETERS) ×1
CATH FOLEY 2WAY SLVR 5CC 16FR (CATHETERS) ×2 IMPLANT
CATH ROBINSON RED A/P 8FR (CATHETERS) ×3 IMPLANT
CLOTH BEACON ORANGE TIMEOUT ST (SAFETY) ×3 IMPLANT
COVER MAYO STAND STRL (DRAPES) IMPLANT
COVER TABLE BACK 60X90 (DRAPES) IMPLANT
DRAIN PENROSE 18X1/4 LTX STRL (WOUND CARE) IMPLANT
DRAPE PED LAPAROTOMY (DRAPES) ×3 IMPLANT
ELECT NEEDLE TIP 2.8 STRL (NEEDLE) ×3 IMPLANT
ELECT REM PT RETURN 9FT ADLT (ELECTROSURGICAL) ×3
ELECTRODE REM PT RTRN 9FT ADLT (ELECTROSURGICAL) ×2 IMPLANT
GAUZE SPONGE 4X4 12PLY STRL LF (GAUZE/BANDAGES/DRESSINGS) IMPLANT
GLOVE BIO SURGEON STRL SZ8 (GLOVE) ×3 IMPLANT
GOWN PREVENTION PLUS LG XLONG (DISPOSABLE) ×3 IMPLANT
GOWN STRL REIN XL XLG (GOWN DISPOSABLE) ×3 IMPLANT
GOWN W/COTTON TOWEL STD LRG (GOWNS) ×3 IMPLANT
GOWN XL W/COTTON TOWEL STD (GOWNS) ×3 IMPLANT
IV NS IRRIG 3000ML ARTHROMATIC (IV SOLUTION) IMPLANT
LOOP VESSEL MAXI BLUE (MISCELLANEOUS) IMPLANT
NEEDLE 27GAX1X1/2 (NEEDLE) ×3 IMPLANT
NEEDLE HYPO 30X.5 LL (NEEDLE) ×9 IMPLANT
NS IRRIG 500ML POUR BTL (IV SOLUTION) ×3 IMPLANT
PACK BASIN DAY SURGERY FS (CUSTOM PROCEDURE TRAY) ×3 IMPLANT
PENCIL BUTTON HOLSTER BLD 10FT (ELECTRODE) ×3 IMPLANT
PLUG CATH AND CAP STER (CATHETERS) ×3 IMPLANT
SET IRRIG Y TYPE TUR BLADDER L (SET/KITS/TRAYS/PACK) IMPLANT
SPONGE LAP 4X18 X RAY DECT (DISPOSABLE) IMPLANT
SUCTION FRAZIER TIP 10 FR DISP (SUCTIONS) IMPLANT
SUPPORT SCROTAL LG STRP (MISCELLANEOUS) ×3 IMPLANT
SUPPORT SCROTAL MED ADLT STRP (MISCELLANEOUS) IMPLANT
SUT CHROMIC 3 0 PS 2 (SUTURE) ×3 IMPLANT
SUT CHROMIC 3 0 SH 27 (SUTURE) ×9 IMPLANT
SUT CHROMIC 4 0 RB 1X27 (SUTURE) ×6 IMPLANT
SUT CHROMIC 5 0 RB 1 27 (SUTURE) IMPLANT
SUT ETHIBOND 2 0 SH (SUTURE) ×2
SUT ETHIBOND 2 0 SH 36X2 (SUTURE) ×4 IMPLANT
SUT ETHIBOND 2 OS 4 DA (SUTURE) IMPLANT
SUT ETHIBOND 3-0 V-5 (SUTURE) IMPLANT
SUT PDS AB 4-0 RB1 27 (SUTURE) IMPLANT
SUT SILK 2 0 TIES 17X18 (SUTURE) ×1
SUT SILK 2-0 18XBRD TIE BLK (SUTURE) ×2 IMPLANT
SUT SILK 4 0 (SUTURE) ×1
SUT SILK 4-0 18XBRD TIE 12 (SUTURE) ×2 IMPLANT
SUT VIC AB 5-0 RB1 27 (SUTURE) IMPLANT
SYR 3ML 23GX1 SAFETY (SYRINGE) ×6 IMPLANT
SYR BULB IRRIGATION 50ML (SYRINGE) IMPLANT
SYR CONTROL 10ML LL (SYRINGE) ×3 IMPLANT
SYR TB 1ML 27GX1/2 SAFE (SYRINGE) ×2 IMPLANT
SYR TB 1ML 27GX1/2 SAFETY (SYRINGE) ×1
SYRINGE 10CC LL (SYRINGE) ×6 IMPLANT
TOWEL OR 17X24 6PK STRL BLUE (TOWEL DISPOSABLE) ×3 IMPLANT
TRAY DSU PREP LF (CUSTOM PROCEDURE TRAY) ×3 IMPLANT
TUBE CONNECTING 12X1/4 (SUCTIONS) IMPLANT
WATER STERILE IRR 3000ML UROMA (IV SOLUTION) IMPLANT
WATER STERILE IRR 500ML POUR (IV SOLUTION) ×3 IMPLANT
YANKAUER SUCT BULB TIP NO VENT (SUCTIONS) IMPLANT

## 2013-08-22 NOTE — Anesthesia Preprocedure Evaluation (Addendum)
Anesthesia Evaluation  Patient identified by MRN, date of birth, ID band Patient awake    Reviewed: Allergy & Precautions, H&P , NPO status , Patient's Chart, lab work & pertinent test results  Airway Mallampati: III TM Distance: <3 FB Neck ROM: Full    Dental no notable dental hx.    Pulmonary sleep apnea and Continuous Positive Airway Pressure Ventilation , COPDformer smoker,  breath sounds clear to auscultation  Pulmonary exam normal       Cardiovascular hypertension, Pt. on medications and Pt. on home beta blockers Rhythm:Regular Rate:Normal  Overall Impression:  Normal stress nuclear study.   LV Ejection Fraction: 56%.  LV Wall Motion:  NL LV Function; NL Wall Motion      Neuro/Psych negative neurological ROS  negative psych ROS   GI/Hepatic negative GI ROS, Neg liver ROS,   Endo/Other  Hypothyroidism Morbid obesity  Renal/GU negative Renal ROS  negative genitourinary   Musculoskeletal negative musculoskeletal ROS (+)   Abdominal   Peds negative pediatric ROS (+)  Hematology negative hematology ROS (+)   Anesthesia Other Findings   Reproductive/Obstetrics negative OB ROS                         Anesthesia Physical Anesthesia Plan  ASA: III  Anesthesia Plan: General   Post-op Pain Management:    Induction: Intravenous  Airway Management Planned: LMA  Additional Equipment:   Intra-op Plan:   Post-operative Plan:   Informed Consent: I have reviewed the patients History and Physical, chart, labs and discussed the procedure including the risks, benefits and alternatives for the proposed anesthesia with the patient or authorized representative who has indicated his/her understanding and acceptance.   Dental advisory given  Plan Discussed with: CRNA and Surgeon  Anesthesia Plan Comments:         Anesthesia Quick Evaluation

## 2013-08-22 NOTE — Interval H&P Note (Signed)
History and Physical Interval Note:  08/22/2013 9:43 AM  Corey Bell  has presented today for surgery, with the diagnosis of Peyronies Disease/Desires Sterilzation  The various methods of treatment have been discussed with the patient and family. After consideration of risks, benefits and other options for treatment, the patient has consented to  Procedure(s): 16 DOT PLACTATION (N/A) VASECTOMY (Bilateral) as a surgical intervention .  The patient's history has been reviewed, patient examined, no change in status, stable for surgery.  I have reviewed the patient's chart and labs.  Questions were answered to the patient's satisfaction.     Garnett Farm

## 2013-08-22 NOTE — Op Note (Signed)
PATIENT:  Corey Bell  PRE-OPERATIVE DIAGNOSIS: 1. 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 Desires sterilization  POST-OPERATIVE DIAGNOSIS: Same  PROCEDURE: 1. 16- Dot plication procedure 2. Bilateral vasectomy  SURGEON:  Garnett Farm  INDICATION: Corey Bell is a 47 year old male with severe penile curvature secondary to Peyronie's disease. He has dorsal curvature and is unable to engage in intercourse due to the degree of curvature. This remained stable over several months period of time and we have discussed the treatment options. He is elected to proceed with plication. He also desires vasectomy be performed the same time.  ANESTHESIA:  General  EBL:  Minimal  DRAINS: None  LOCAL MEDICATIONS USED:  Half percent Marcaine plain  SPECIMEN:  None  Description of procedure: After informed consent the patient was brought to the major or, placed on the table and administered general anesthesia. An official timeout was then performed. 60 cc of papaverine were injected into the corpora in order to elicit a artificial erection.  While I was awaiting the effects of the papaverine I proceeded with his vasectomy. I used the piercing hemostats to make an opening in the midline of the scrotum. I was able to grasp the vas first on the left side with the vas clamp and then cleared of surrounding tissue and isolated a segment. I then ligated the proximal and distal end with a 3-0 silk suture , excised the segment of as between the sutures and then cauterized the lumen of the mass with the electrocautery unit. This was then allowed to drop back into the left hemiscrotum. I then grasped the right as with the vas clamp through the scrotal incision and it was treated in an identical fashion. I then injected local anesthetic and closed the incision with 2, interrupted, 4-0 chromic sutures.  I placed a Foley catheter in the bladder and drain the bladder. I then made a midline incision  over the corpus spongiosum in the mid penis. This was where the greatest amount of curvature was noted. He had a good effect from the papaverine. I then cleared the tissue from both the right and left corpus cavernosum for about 4 mm on each side of the corpus spongiosum. I then marked 8 equidistant dots on the corpus cavernosum for proximal and 4 distal to the area of greatest curvature. 2-0 braided polyester suture was then used for the plicating sutures by starting proximally and placing this in the most proximal dot and then beneath the corpus cavernosum for length of approximately a centimeter and then back out advancing another centimeter and then irrigated out again. This was performed on each side proximally and distally so there were 4 plicating sutures. I tied each of these with a surgeon's knot and placed a shod hemostat against the surgeon's knot and checked for straightening of the penis. There appeared to be good straightening. I adjusted each suture just slightly so that the penis was straight both dorsally and ventrally as well as laterally. I then tied each of the 4 sutures. I checked for bleeding and cauterized any bleeding points and then reapproximated the deep fascia with running, interrupted 4-0 chromic. I then closed the skin with running 3-0 chromic. Neosporin was applied to all the incisions and the penis was gently wrapped with folded 4 x 4's and a Coban was applied. Fluffed Curlex and a scrotal support was applied to the scrotum. The patient was awakened and taken to the recovery room in stable and satisfactory condition.  He tolerated the procedure well and there were no intraoperative complications. Needle sponge and instrument counts were correct x2 at the end of the operation.  PLAN OF CARE: Discharge to home after PACU  PATIENT DISPOSITION:  PACU - hemodynamically stable.

## 2013-08-22 NOTE — Transfer of Care (Signed)
Immediate Anesthesia Transfer of Care Note  Patient: Corey Bell  Procedure(s) Performed: Procedure(s) (LRB): 16 DOT PLACTATION (N/A) VASECTOMY (Bilateral)  Patient Location: PACU  Anesthesia Type: General  Level of Consciousness: awake, oriented, sedated and patient cooperative  Airway & Oxygen Therapy: Patient Spontanous Breathing and Patient connected to face mask oxygen  Post-op Assessment: Report given to PACU RN and Post -op Vital signs reviewed and stable  Post vital signs: Reviewed and stable  Complications: No apparent anesthesia complications

## 2013-08-22 NOTE — Anesthesia Procedure Notes (Signed)
Procedure Name: LMA Insertion Date/Time: 08/22/2013 10:22 AM Performed by: Renella Cunas D Pre-anesthesia Checklist: Patient identified, Emergency Drugs available, Suction available and Patient being monitored Patient Re-evaluated:Patient Re-evaluated prior to inductionOxygen Delivery Method: Circle System Utilized Preoxygenation: Pre-oxygenation with 100% oxygen Intubation Type: IV induction Ventilation: Mask ventilation without difficulty LMA: LMA inserted LMA Size: 5.0 Number of attempts: 1 Airway Equipment and Method: bite block Placement Confirmation: positive ETCO2 Tube secured with: Tape Dental Injury: Teeth and Oropharynx as per pre-operative assessment

## 2013-08-22 NOTE — Anesthesia Postprocedure Evaluation (Signed)
  Anesthesia Post-op Note  Patient: Corey Bell  Procedure(s) Performed: Procedure(s) (LRB): 16 DOT PLACTATION (N/A) VASECTOMY (Bilateral)  Patient Location: PACU  Anesthesia Type: General  Level of Consciousness: awake and alert   Airway and Oxygen Therapy: Patient Spontanous Breathing  Post-op Pain: mild  Post-op Assessment: Post-op Vital signs reviewed, Patient's Cardiovascular Status Stable, Respiratory Function Stable, Patent Airway and No signs of Nausea or vomiting  Last Vitals:  Filed Vitals:   08/22/13 1143  BP: 126/68  Pulse: 77  Temp: 37.2 C  Resp: 11    Post-op Vital Signs: stable   Complications: No apparent anesthesia complications

## 2013-08-25 ENCOUNTER — Encounter (HOSPITAL_BASED_OUTPATIENT_CLINIC_OR_DEPARTMENT_OTHER): Payer: Self-pay | Admitting: Urology

## 2013-09-01 ENCOUNTER — Other Ambulatory Visit: Payer: BC Managed Care – PPO

## 2013-09-15 ENCOUNTER — Encounter: Payer: Self-pay | Admitting: Internal Medicine

## 2013-09-15 ENCOUNTER — Ambulatory Visit (INDEPENDENT_AMBULATORY_CARE_PROVIDER_SITE_OTHER): Payer: BC Managed Care – PPO | Admitting: Internal Medicine

## 2013-09-15 VITALS — BP 128/78 | HR 60 | Temp 97.9°F | Resp 20 | Ht 74.0 in | Wt 298.5 lb

## 2013-09-15 DIAGNOSIS — G5790 Unspecified mononeuropathy of unspecified lower limb: Secondary | ICD-10-CM

## 2013-09-15 DIAGNOSIS — M792 Neuralgia and neuritis, unspecified: Secondary | ICD-10-CM

## 2013-09-15 DIAGNOSIS — E669 Obesity, unspecified: Secondary | ICD-10-CM

## 2013-09-15 DIAGNOSIS — B078 Other viral warts: Secondary | ICD-10-CM

## 2013-09-15 NOTE — Progress Notes (Signed)
Subjective:    Patient ID: Corey Bell, male    DOB: 1966/02/20, 47 y.o.   MRN: [id number]  HPI  Pt presents to the clinic today with a "spot on his leg". He reports that this area burns. It is located on his right anterior thigh is.This feeling started over the last few months but seems to be getting worse. He denies injury to the area. He feels like he may have nerve damage from the heavy set of keys in his pocket. He already takes neurontin. The pain is not that bad, it does not affect his ability to walk. Additionally, he would like to see if a wart can be frozen off his right medial middle finger. It has been there for years. It bothers him. He would like it taken off today. He also wants to let me know that he plans to take Lipozene that he saw advertised on TV after the holidays. He would also like Dr. Dayton Martes to know that he had his peyronie surgery on 08/26/2013   Review of Systems  Past Medical History  Diagnosis Date  . Carpal tunnel syndrome, bilateral   . H/O acute prostatitis   . Unspecified essential hypertension   . Diverticulitis of colon   . Esophageal reflux   . Mixed hyperlipidemia   . 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 Seasonal allergies   . Aphthous ulcer 10-04-12    improving  . Chronic pain syndrome   . Insomnia, unspecified   . Tympanic membrane perforation     history of  . Anxiety   . Lumbar degenerative disc disease   . Hypothyroidism   . COPD (chronic obstructive pulmonary disease)   . Sleep apnea     uses cpap  . Headache(784.0)     hx of migraines    Current Outpatient Prescriptions  Medication Sig Dispense Refill  . aspirin 325 MG tablet Take 2 by mouth daily      . Beclomethasone Diprop, Nasal, (QNASL NA) 2 sprays each nostril daily      . buPROPion (WELLBUTRIN XL) 300 MG 24 hr tablet Take 1 tablet (300 mg total) by mouth daily.  30 tablet  3  . citalopram (CELEXA) 40 MG tablet Take 40 mg by mouth daily.      . fexofenadine  (ALLEGRA) 180 MG tablet Take 180 mg by mouth daily.      Marland Kitchen gabapentin (NEURONTIN) 400 MG capsule Take 400 mg by mouth. Take 4 by mouth daily      . levothyroxine (SYNTHROID, LEVOTHROID) 75 MCG tablet Take 75 mcg by mouth daily before breakfast.      . lisinopril (PRINIVIL,ZESTRIL) 10 MG tablet Take 10 mg by mouth daily.      . meloxicam (MOBIC) 15 MG tablet Take one to two tablets by mouth daily      . metoprolol succinate (TOPROL-XL) 50 MG 24 hr tablet Take 50 mg by mouth daily. Take with or immediately following a meal.      . montelukast (SINGULAIR) 10 MG tablet Take 10 mg by mouth at bedtime.      Marland Kitchen tiZANidine (ZANAFLEX) 4 MG tablet Take 4 mg by mouth daily as needed for muscle spasms.      Marland Kitchen topiramate (TOPAMAX) 100 MG tablet Take 100 mg by mouth daily.      Marland Kitchen atorvastatin (LIPITOR) 10 MG tablet Take 1 tablet (10 mg total) by mouth daily.  30 tablet  3  . Fluticasone-Salmeterol (  ADVAIR) 100-50 MCG/DOSE AEPB Inhale 1 puff into the lungs daily.       Marland Kitchen HYDROcodone-acetaminophen (NORCO/VICODIN) 5-325 MG per tablet Take 1 tablet by mouth every 6 (six) hours as needed for moderate pain.      . hydrocortisone (ANUSOL-HC) 2.5 % rectal cream Apply to rectal area twice as needed. Do not use for more than 2 weeks consecutively without calling doctor.  30 g  0  . levalbuterol (XOPENEX HFA) 45 MCG/ACT inhaler Inhale 1-2 puffs into the lungs every 4 (four) hours as needed for wheezing.  1 Inhaler  12  . Melatonin 5 MG TABS Take by mouth as needed      . oxyCODONE-acetaminophen (PERCOCET) 10-325 MG per tablet Take 1 tablet by mouth every 4 (four) hours as needed for pain.  30 tablet  0  . vitamin E 1000 UNIT capsule Take 3 by mouth daily       No current facility-administered medications for this visit.    Allergies  Allergen Reactions  . Demerol [Meperidine]     "goes crazy and has the strength of 10 men"  . Eggs Or Egg-Derived Products Nausea Only  . Statins Rash  . Sulfa Antibiotics Rash     Family History  Problem Relation Age of Onset  . Bladder Cancer Paternal Uncle   . Renal cancer Paternal Uncle     renal cell carcinoma  . Congestive Heart Failure Father   . Heart disease Father   . COPD Father   . Heart disease Brother   . Heart disease Brother     MI at 40  . Renal cancer Paternal Grandfather     renal call carcinoma  . Heart disease Brother     stents at 41 yo    History   Social History  . Marital Status: Married    Spouse Name: [name]    Number of Children: 0  . Years of Education: college   Occupational History  . MAINTENANCE SUPER    Social History Main Topics  . Smoking status: Former Smoker -- 1.50 packs/day for 30 years    Types: Cigarettes  . Smokeless tobacco: Never Used  . Alcohol Use: 0.6 oz/week    1 Cans of beer per week     Comment: rarely  . Drug Use: No  . Sexual Activity: Not on file   Other Topics Concern  . Not on file   Social History Narrative   Married.  [name])  No children. Patient has Chiropodist.   In process of getting disability for low back pain.   Right handed.   Caffeine- Coffee a pot but not every day.     Constitutional: Pt reports weight gain. Denies fever, malaise, fatigue, headache.  Musculoskeletal: Denies decrease in range of motion, difficulty with gait, muscle pain or joint pain and swelling.  Skin: Denies redness, rashes, lesions or ulcercations.  Neurological: Denies dizziness, difficulty with memory, difficulty with speech or problems with balance and coordination.   No other specific complaints in a complete review of systems (except as listed in HPI above).     Objective:   Physical Exam   BP 128/78  Pulse 60  Temp(Src) 97.9 F (36.6 C) (Oral)  Resp 20  Ht 6\' 2"  (1.88 m)  Wt 298 lb 8 oz (135.399 kg)  BMI 38.31 kg/m2 Wt Readings from Last 3 Encounters:  09/15/13 298 lb 8 oz (135.399 kg)  08/22/13 291 lb 8 oz (132.224 kg)  08/22/13 291  lb 8 oz (132.224 kg)    General:  Appears his stated age, obese but well developed, well nourished in NAD. Skin: Warm, dry and intact. No rashes, lesions or ulcerations noted. Cardiovascular: Normal rate and rhythm. S1,S2 noted.  No murmur, rubs or gallops noted. No JVD or BLE edema. No carotid bruits noted. Pulmonary/Chest: Normal effort and positive vesicular breath sounds. No respiratory distress. No wheezes, rales or ronchi noted.  Musculoskeletal: Normal range of motion. Strength 5/5 BLE. No signs of joint swelling. No difficulty with gait.  Neurological: Alert and oriented. Cranial nerves II-XII intact. Coordination normal. +DTRs bilaterally. Sensation intact to the right anterior thigh.   BMET    Component Value Date/Time   NA 140 08/22/2013 0932   K 4.2 08/22/2013 0932   CL 106 05/28/2013 1143   CO2 26 05/28/2013 1143   GLUCOSE 129* 08/22/2013 0932   BUN 19 05/28/2013 1143   CREATININE 1.4 05/28/2013 1143   CALCIUM 9.7 05/28/2013 1143   GFRNONAA 60* 11/08/2010 0846   GFRAA  Value: >60        The eGFR has been calculated using the MDRD equation. This calculation has not been validated in all clinical situations. eGFR's persistently <60 mL/min signify possible Chronic Kidney Disease. 11/08/2010 0846    Lipid Panel     Component Value Date/Time   CHOL 220* 07/01/2013 0812   TRIG 222.0* 07/01/2013 0812   HDL 34.50* 07/01/2013 0812   CHOLHDL 6 07/01/2013 0812   VLDL 44.4* 07/01/2013 0812    CBC    Component Value Date/Time   WBC 6.4 11/08/2010 0846   RBC 4.88 11/08/2010 0846   HGB 17.7* 08/22/2013 0932   HCT 52.0 08/22/2013 0932   PLT 216 11/08/2010 0846   MCV 91.4 11/08/2010 0846   MCH 30.1 11/08/2010 0846   MCHC 33.0 11/08/2010 0846   RDW 12.8 11/08/2010 0846    Hgb A1C Lab Results  Component Value Date   HGBA1C 6.3 05/28/2013        Assessment & Plan:   Neuropathic pain of right anterior thight:  Is not bothering him that much He is already on gabapentin Will continue to monitor for now  Wart right  medial middle finger:  Informed consent obtained Risk vs benefits discussed Proceeded to freeze area with liquid nitrogen Aftercare instructions given  Obesity:  Pt would like to take lipozene I advised him against this He reports he is going to start it anyway after the holidays  RTC as needed

## 2013-09-15 NOTE — Progress Notes (Signed)
Pre-visit discussion using our clinic review tool. No additional management support is needed unless otherwise documented below in the visit note.  

## 2013-09-15 NOTE — Patient Instructions (Signed)
Warts Warts are a common viral infection. They are most commonly caused by the human papillomavirus (HPV). Warts can occur at all ages. However, they occur most frequently in older children and infrequently in the elderly. Warts may be single or multiple. Location and size varies. Warts can be spread by scratching the wart and then scratching normal skin. The life cycle of warts varies. However, most will disappear over many months to a couple years. Warts commonly do not cause problems (asymptomatic) unless they are over an area of pressure, such as the bottom of the foot. If they are large enough, they may cause pain with walking. DIAGNOSIS  Warts are most commonly diagnosed by their appearance. Tissue samples (biopsies) are not required unless the wart looks abnormal. Most warts have a rough surface, are round, oval, or irregular, and are skin-colored to light yellow, brown, or gray. They are generally less than  inch (1.3 cm), but they can be any size. TREATMENT   Observation or no treatment.  Freezing with liquid nitrogen.  High heat (cautery).  Boosting the body's immunity to fight off the wart (immunotherapy using Candida antigen).  Laser surgery.  Application of various irritants and solutions. HOME CARE INSTRUCTIONS  Follow your caregiver's instructions. No special precautions are necessary. Often, treatment may be followed by a return (recurrence) of warts. Warts are generally difficult to treat and get rid of. If treatment is done in a clinic setting, usually more than 1 treatment is required. This is usually done on only a monthly basis until the wart is completely gone. SEEK IMMEDIATE MEDICAL CARE IF: The treated skin becomes red, puffy (swollen), or painful. Document Released: 07/05/2005 Document Revised: 01/20/2013 Document Reviewed: 12/31/2009 [employer] Patient Information 2014 [employer].  

## 2013-09-30 ENCOUNTER — Other Ambulatory Visit: Payer: Self-pay

## 2013-09-30 MED ORDER — LISINOPRIL 10 MG PO TABS: 10.0000 mg | ORAL_TABLET | Freq: Every day | ORAL | Status: DC

## 2013-09-30 MED ORDER — CITALOPRAM HYDROBROMIDE 40 MG PO TABS: 40.0000 mg | ORAL_TABLET | Freq: Every day | ORAL | Status: DC

## 2013-09-30 NOTE — Telephone Encounter (Signed)
Request for Citalopram 1/2-1 tab daily and Lisinopril last filled 09/11/13--Please advise

## 2013-09-30 NOTE — Telephone Encounter (Signed)
Rx sent to pharmacy by Rene Kocher

## 2013-10-13 DIAGNOSIS — M51379 Other intervertebral disc degeneration, lumbosacral region without mention of lumbar back pain or lower extremity pain: Secondary | ICD-10-CM | POA: Insufficient documentation

## 2013-10-13 DIAGNOSIS — M129 Arthropathy, unspecified: Secondary | ICD-10-CM | POA: Insufficient documentation

## 2013-10-13 DIAGNOSIS — I10 Essential (primary) hypertension: Secondary | ICD-10-CM | POA: Insufficient documentation

## 2013-10-13 DIAGNOSIS — J449 Chronic obstructive pulmonary disease, unspecified: Secondary | ICD-10-CM | POA: Insufficient documentation

## 2013-10-13 DIAGNOSIS — G5600 Carpal tunnel syndrome, unspecified upper limb: Secondary | ICD-10-CM | POA: Insufficient documentation

## 2013-10-13 DIAGNOSIS — M5414 Radiculopathy, thoracic region: Secondary | ICD-10-CM | POA: Insufficient documentation

## 2013-10-13 DIAGNOSIS — M5417 Radiculopathy, lumbosacral region: Secondary | ICD-10-CM

## 2013-10-13 DIAGNOSIS — M5137 Other intervertebral disc degeneration, lumbosacral region: Secondary | ICD-10-CM | POA: Insufficient documentation

## 2013-10-13 DIAGNOSIS — M5412 Radiculopathy, cervical region: Secondary | ICD-10-CM | POA: Insufficient documentation

## 2013-10-13 DIAGNOSIS — M5030 Other cervical disc degeneration, unspecified cervical region: Secondary | ICD-10-CM | POA: Insufficient documentation

## 2013-10-28 ENCOUNTER — Encounter: Payer: Self-pay | Admitting: Family Medicine

## 2013-10-28 ENCOUNTER — Ambulatory Visit (INDEPENDENT_AMBULATORY_CARE_PROVIDER_SITE_OTHER): Payer: BC Managed Care – PPO | Admitting: Family Medicine

## 2013-10-28 VITALS — BP 128/66 | HR 56 | Temp 97.6°F | Ht 74.0 in | Wt 304.5 lb

## 2013-10-28 DIAGNOSIS — M25519 Pain in unspecified shoulder: Secondary | ICD-10-CM

## 2013-10-28 DIAGNOSIS — M25511 Pain in right shoulder: Secondary | ICD-10-CM

## 2013-10-28 DIAGNOSIS — M5136 Other intervertebral disc degeneration, lumbar region: Secondary | ICD-10-CM

## 2013-10-28 DIAGNOSIS — M5137 Other intervertebral disc degeneration, lumbosacral region: Secondary | ICD-10-CM

## 2013-10-28 DIAGNOSIS — G894 Chronic pain syndrome: Secondary | ICD-10-CM

## 2013-10-28 MED ORDER — MONTELUKAST SODIUM 10 MG PO TABS: 10.0000 mg | ORAL_TABLET | Freq: Every day | ORAL | Status: DC

## 2013-10-28 MED ORDER — METOPROLOL SUCCINATE ER 50 MG PO TB24: 50.0000 mg | ORAL_TABLET | Freq: Every day | ORAL | Status: DC

## 2013-10-28 MED ORDER — TOPIRAMATE 100 MG PO TABS: 100.0000 mg | ORAL_TABLET | Freq: Every day | ORAL | Status: DC

## 2013-10-28 MED ORDER — MELOXICAM 15 MG PO TABS: ORAL_TABLET | ORAL | Status: DC

## 2013-10-28 MED ORDER — LEVOTHYROXINE SODIUM 75 MCG PO TABS: 75.0000 ug | ORAL_TABLET | Freq: Every day | ORAL | Status: DC

## 2013-10-28 NOTE — Progress Notes (Signed)
Subjective:    Patient ID: Corey Bell, male    DOB: [birth year], 48 y.o.   MRN: [id number]  HPI  48 yo male here to discuss pain management.  Also has right shoulder pain he would like to discuss.    Back pain- chronic issue.  S/p L5/S1 surgery.  Not working due to this pain.  He is followed by pain management.  Per pt, pain management feels he no longer needs to see him since he has only taken 6 oxycodone since 06/2013.  He tries not to take this.  Takes meloxicam and gabapentin more regularly.  Right shoulder has been flaring up- injury over 30 years ago.  ROM normal but has some pain at night in subscapular area. Wants to talk to me about orthopedists.   Patient Active Problem List   Diagnosis Date Noted  . Right shoulder pain 10/28/2013  . OSA (obstructive sleep apnea) 07/01/2013  . Essential hypertension 05/29/2013  . Hyperlipidemia 05/29/2013  . Exertional dyspnea 05/29/2013  . Family history of heart disease 05/28/2013  . Depression 05/28/2013  . Tremor 05/28/2013  . Anxiety   . Lumbar degenerative disc disease   . Chronic pain syndrome   . Hypothyroidism   . 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 Carpal tunnel syndrome, bilateral   . H/O acute prostatitis   . Unspecified essential hypertension   . Diverticulitis of colon   . Esophageal reflux   . Mixed hyperlipidemia   . 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 Seasonal allergies   . Aphthous ulcer 10-04-12    improving  . Chronic pain syndrome   . Insomnia, unspecified   . Tympanic membrane perforation     history of  . Anxiety   . Lumbar degenerative disc disease   . Hypothyroidism   . COPD (chronic obstructive pulmonary disease)   . Sleep apnea     uses cpap  . Headache(784.0)     hx of migraines   Past Surgical History  Procedure Laterality Date  . Back surgery    . Tympanostomy tube placement  1975  . Knee surgery      torn meniscus  . Nesbit procedure N/A  08/22/2013    Procedure: 16 DOT PLACTATION;  Surgeon: Claybon Jabs, MD;  Location: [facility];  Service: Urology;  Laterality: N/A;  . Vasectomy Bilateral 08/22/2013    Procedure: VASECTOMY;  Surgeon: Claybon Jabs, MD;  Location: [facility];  Service: Urology;  Laterality: Bilateral;   History  Substance Use Topics  . Smoking status: Former Smoker -- 1.50 packs/day for 30 years    Types: Cigarettes  . Smokeless tobacco: Never Used  . Alcohol Use: 0.6 oz/week    1 Cans of beer per week     Comment: rarely   Family History  Problem Relation Age of Onset  . Bladder Cancer Paternal Uncle   . Renal cancer Paternal Uncle     renal cell carcinoma  . Congestive Heart Failure Father   . Heart disease Father   . COPD Father   . Heart disease Brother   . Heart disease Brother     MI at 20  . Renal cancer Paternal Grandfather     renal call carcinoma  . Heart disease Brother     stents at 76 yo   Allergies  Allergen Reactions  . Demerol [Meperidine]     "goes crazy and  has the strength of 10 men"  . Eggs Or Egg-Derived Products Nausea Only  . Statins Rash  . Sulfa Antibiotics Rash   Current Outpatient Prescriptions on File Prior to Visit  Medication Sig Dispense Refill  . aspirin 325 MG tablet Take 2 by mouth daily      . Beclomethasone Diprop, Nasal, (QNASL NA) 2 sprays each nostril daily      . buPROPion (WELLBUTRIN XL) 300 MG 24 hr tablet Take 1 tablet (300 mg total) by mouth daily.  30 tablet  3  . citalopram (CELEXA) 40 MG tablet Take 1 tablet (40 mg total) by mouth daily.  30 tablet  2  . fexofenadine (ALLEGRA) 180 MG tablet Take 180 mg by mouth daily.      Marland Kitchen gabapentin (NEURONTIN) 400 MG capsule Take 400 mg by mouth. Take 4 by mouth daily      . hydrocortisone (ANUSOL-HC) 2.5 % rectal cream Apply to rectal area twice as needed. Do not use for more than 2 weeks consecutively without calling doctor.  30 g  0  . levalbuterol (XOPENEX HFA) 45  MCG/ACT inhaler Inhale 1-2 puffs into the lungs every 4 (four) hours as needed for wheezing.  1 Inhaler  12  . levothyroxine (SYNTHROID, LEVOTHROID) 75 MCG tablet Take 75 mcg by mouth daily before breakfast.      . lisinopril (PRINIVIL,ZESTRIL) 10 MG tablet Take 1 tablet (10 mg total) by mouth daily.  30 tablet  2  . Melatonin 5 MG TABS Take by mouth as needed      . meloxicam (MOBIC) 15 MG tablet Take one to two tablets by mouth daily      . metoprolol succinate (TOPROL-XL) 50 MG 24 hr tablet Take 50 mg by mouth daily. Take with or immediately following a meal.      . montelukast (SINGULAIR) 10 MG tablet Take 10 mg by mouth at bedtime.      Marland Kitchen oxyCODONE-acetaminophen (PERCOCET) 10-325 MG per tablet Take 1 tablet by mouth every 4 (four) hours as needed for pain.  30 tablet  0  . tiZANidine (ZANAFLEX) 4 MG tablet Take 4 mg by mouth daily as needed for muscle spasms.      Marland Kitchen topiramate (TOPAMAX) 100 MG tablet Take 100 mg by mouth daily.       No current facility-administered medications on file prior to visit.   The PMH, PSH, Social History, Family History, Medications, and allergies have been reviewed in [facility], and have been updated if relevant.   Review of Systems See HPI       Objective:   Physical Exam BP 128/66  Pulse 56  Temp(Src) 97.6 F (36.4 C) (Oral)  Ht 6\' 2"  (1.88 m)  Wt 304 lb 8 oz (138.12 kg)  BMI 39.08 kg/m2  SpO2 96% General:  overweght male in NAD Extremities:  From of shoulders bilaterally, neg empty can bilaterally, normal grip strength bilaterally      Assessment & Plan:

## 2013-10-28 NOTE — Progress Notes (Signed)
Pre-visit discussion using our clinic review tool. No additional management support is needed unless otherwise documented below in the visit note.  

## 2013-10-28 NOTE — Assessment & Plan Note (Signed)
>  25 min spent with face to face with patient, >50% counseling and/or coordinating care  Discussed our controlled substances policy and procedures.  I would be happy to prescribe narcotic as long as he follows our contract. The patient indicates understanding of these issues and agrees with the plan.

## 2013-10-28 NOTE — Patient Instructions (Signed)
Good to see you. I would be happy to take over pain management as long as you follow our pain contract.

## 2013-10-28 NOTE — Assessment & Plan Note (Signed)
Probable OA. Discussed making appt with Dr. Lorelei Pont. The patient indicates understanding of these issues and agrees with the plan.

## 2013-11-12 ENCOUNTER — Other Ambulatory Visit: Payer: Self-pay | Admitting: Family Medicine

## 2013-11-12 NOTE — Telephone Encounter (Signed)
Last filled 07/01/13

## 2013-11-20 ENCOUNTER — Ambulatory Visit: Payer: BC Managed Care – PPO | Admitting: Internal Medicine

## 2013-11-21 ENCOUNTER — Encounter: Payer: Self-pay | Admitting: Internal Medicine

## 2013-11-21 ENCOUNTER — Ambulatory Visit (INDEPENDENT_AMBULATORY_CARE_PROVIDER_SITE_OTHER): Payer: BC Managed Care – PPO | Admitting: Internal Medicine

## 2013-11-21 VITALS — BP 122/78 | HR 74 | Temp 97.4°F | Wt 295.0 lb

## 2013-11-21 DIAGNOSIS — J019 Acute sinusitis, unspecified: Secondary | ICD-10-CM

## 2013-11-21 MED ORDER — AMOXICILLIN-POT CLAVULANATE 875-125 MG PO TABS: 1.0000 | ORAL_TABLET | Freq: Two times a day (BID) | ORAL | Status: DC

## 2013-11-21 NOTE — Progress Notes (Signed)
Pre-visit discussion using our clinic review tool. No additional management support is needed unless otherwise documented below in the visit note.  

## 2013-11-21 NOTE — Patient Instructions (Addendum)

## 2013-11-21 NOTE — Progress Notes (Signed)
HPI  Pt presents to the clinic today with c/o nasal congestion and facial pain. He reports this started about 10 days ago. He is not blowing much out of his nose. He denies fever, chills or body aches. He has tried OTC Mucinex with minimal relief. He does have a history of seasonal allergies, COPD and sleep apnea. He is on Allegra, Qnasal, Singulair and Xopenex. He has not had sick contacts. He does not smoke.  Review of Systems    Past Medical History  Diagnosis Date  . Carpal tunnel syndrome, bilateral   . H/O acute prostatitis   . Unspecified essential hypertension   . Diverticulitis of colon   . Esophageal reflux   . Mixed hyperlipidemia   . 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 Seasonal allergies   . Aphthous ulcer 10-04-12    improving  . Chronic pain syndrome   . Insomnia, unspecified   . Tympanic membrane perforation     history of  . Anxiety   . Lumbar degenerative disc disease   . Hypothyroidism   . COPD (chronic obstructive pulmonary disease)   . Sleep apnea     uses cpap  . Headache(784.0)     hx of migraines    Family History  Problem Relation Age of Onset  . Bladder Cancer Paternal Uncle   . Renal cancer Paternal Uncle     renal cell carcinoma  . Congestive Heart Failure Father   . Heart disease Father   . COPD Father   . Heart disease Brother   . Heart disease Brother     MI at 3  . Renal cancer Paternal Grandfather     renal call carcinoma  . Heart disease Brother     stents at 30 yo    History   Social History  . Marital Status: Married    Spouse Name: [name]    Number of Children: 0  . Years of Education: college   Occupational History  . MAINTENANCE SUPER    Social History Main Topics  . Smoking status: Former Smoker -- 1.50 packs/day for 30 years    Types: Cigarettes  . Smokeless tobacco: Never Used  . Alcohol Use: 0.6 oz/week    1 Cans of beer per week     Comment: rarely  . Drug Use: No  . Sexual Activity: Not on file    Other Topics Concern  . Not on file   Social History Narrative   Married.  [name])  No children. Patient has Best boy.   In process of getting disability for low back pain.   Right handed.   Caffeine- Coffee a pot but not every day.    Allergies  Allergen Reactions  . Demerol [Meperidine]     "goes crazy and has the strength of 10 men"  . Eggs Or Egg-Derived Products Nausea Only  . Statins Rash  . Sulfa Antibiotics Rash     Constitutional: Positive headache, fatigue. Denies fever or abrupt weight changes.  HEENT:  Positive facial pain, nasal congestion and sore throat. Denies eye redness, ear pain, ringing in the ears, wax buildup, runny nose or bloody nose. Respiratory: Denies cough, difficulty breathing or shortness of breath.  Cardiovascular: Denies chest pain, chest tightness, palpitations or swelling in the hands or feet.   No other specific complaints in a complete review of systems (except as listed in HPI above).  Objective:    General: Appears his stated age, obese but well  developed, well nourished in NAD. HEENT: Head: normal shape and size, maxillary sinus tenderness noted, R>L; Eyes: sclera white, no icterus, conjunctiva pink, PERRLA and EOMs intact; Ears: Tm's gray and intact, normal light reflex, + effusions bilaterally R>L; Nose: mucosa pink and moist, septum midline; Throat/Mouth: + PND. Teeth present, mucosa pink and moist, no exudate noted, no lesions or ulcerations noted.  Neck: Neck supple, trachea midline. No massses, lumps or thyromegaly present.  Cardiovascular: Normal rate and rhythm. S1,S2 noted.  No murmur, rubs or gallops noted. No JVD or BLE edema. No carotid bruits noted. Pulmonary/Chest: Normal effort and positive vesicular breath sounds. No respiratory distress. No wheezes, rales or ronchi noted.      Assessment & Plan:   Acute bacterial sinusitis  Can use a Neti Pot which can be purchased from your local drug store. Continue  Qnasal, Allegra and Singulair Augmentin BID for 10 days  RTC as needed or if symptoms persist.

## 2013-11-24 ENCOUNTER — Telehealth: Payer: Self-pay

## 2013-11-24 ENCOUNTER — Other Ambulatory Visit: Payer: Self-pay | Admitting: Internal Medicine

## 2013-11-24 MED ORDER — CEFUROXIME AXETIL 500 MG PO TABS: 500.0000 mg | ORAL_TABLET | Freq: Two times a day (BID) | ORAL | Status: DC

## 2013-11-24 NOTE — Telephone Encounter (Signed)
Will call in ceftin

## 2013-11-24 NOTE — Telephone Encounter (Signed)
Left detailed message on VM letting pt know the new Rx was sent to pharmacy

## 2013-11-24 NOTE — Telephone Encounter (Signed)
Pt has been taking Augmentin with food and diarrhea started. Pt stopped Augmentin on 11/21/13 in evening. Pt request different antibiotic to [facility].Pt is allergic to sulfa. Please advise.

## 2013-12-09 ENCOUNTER — Encounter: Payer: Self-pay | Admitting: *Deleted

## 2013-12-10 ENCOUNTER — Ambulatory Visit (INDEPENDENT_AMBULATORY_CARE_PROVIDER_SITE_OTHER): Payer: BC Managed Care – PPO | Admitting: Cardiology

## 2013-12-10 ENCOUNTER — Encounter: Payer: Self-pay | Admitting: Cardiology

## 2013-12-10 ENCOUNTER — Encounter (INDEPENDENT_AMBULATORY_CARE_PROVIDER_SITE_OTHER): Payer: Self-pay

## 2013-12-10 VITALS — BP 120/54 | HR 61 | Ht 74.0 in | Wt 295.0 lb

## 2013-12-10 DIAGNOSIS — R0989 Other specified symptoms and signs involving the circulatory and respiratory systems: Secondary | ICD-10-CM

## 2013-12-10 DIAGNOSIS — R0600 Dyspnea, unspecified: Secondary | ICD-10-CM

## 2013-12-10 DIAGNOSIS — E785 Hyperlipidemia, unspecified: Secondary | ICD-10-CM

## 2013-12-10 DIAGNOSIS — R0609 Other forms of dyspnea: Secondary | ICD-10-CM

## 2013-12-10 DIAGNOSIS — I10 Essential (primary) hypertension: Secondary | ICD-10-CM

## 2013-12-10 MED ORDER — PRAVASTATIN SODIUM 20 MG PO TABS: 20.0000 mg | ORAL_TABLET | Freq: Every evening | ORAL | Status: DC

## 2013-12-10 NOTE — Patient Instructions (Signed)
Start pravachol 20mg  daily in the evening.   Take coenzyme Q10 200mg  daily. You do not need a prescription for this.  Your physician recommends that you return for a FASTING lipid profile /liver profile/BMET in 2 months.   Your physician wants you to follow-up in: 1 year with Dr Aundra Dubin. (March 2016).  You will receive a reminder letter in the mail two months in advance. If you don't receive a letter, please call our office to schedule the follow-up appointment.

## 2013-12-11 NOTE — Progress Notes (Signed)
Patient ID: Corey Bell, male   DOB: 1966-08-10, 48 y.o.   MRN: [id number] PCP: Dr. Deborra Medina  48 yo with h/o COPD, OSA, HTN, and hyperlipidemia with strong family history of CAD presents for cardiology followup. He used to smoke 2 ppd but quit smoking in 2012.  He had a brother with an MI at 21 and another brother with PCI at 32.  He had a normal Lexiscan Cardiolite in 9/14 and an echo showing normal EF and mild LVH.  He has had chronic exertional dyspnea.  He is short of breath walking up a flight of steps or walking 25 yards fast.  This has not changed. No chest pain.  He has OSA.  He is on disability because of back pain.  Weight is higher than at past appointment. He did not tolerate atorvastatin due to GI side effects.   ECG: NSR, nonsignificant inferior Qs  Labs (9/14): HDL 34.5, LDL 150  PMH: 1. COPD: Mild.   2. Hypothyroidism 3. Obesity 4. Low back pain s/p surgery 5. Depression 6. OSA 7. HTN 8. Hyperlipidemia: Myalgias and rash with simvastatin.  GI side effects with atorvastatin.  9. Dyspnea: Lexiscan Cardiolite (9/14) with EF 56%, no ischemia or infarction.  Echo (9/14) with EF 55-60%, mild LVH.   SH: Married, lives in [place], disabled (back), used to smoke 2 ppd but quit in 2012.    FH: Brother with MI at 46, other brother with PCI at 43, father with CAD.   ROS: All systems reviewed and negative except as per HPI.   Current Outpatient Prescriptions  Medication Sig Dispense Refill  . aspirin 325 MG tablet Take 2 by mouth daily      . buPROPion (WELLBUTRIN XL) 300 MG 24 hr tablet TAKE ONE (1) TABLET EACH DAY  30 tablet  6  . cefUROXime (CEFTIN) 500 MG tablet Take 1 tablet (500 mg total) by mouth 2 (two) times daily with a meal.  20 tablet  0  . citalopram (CELEXA) 40 MG tablet Take 1 tablet (40 mg total) by mouth daily.  30 tablet  2  . fexofenadine (ALLEGRA) 180 MG tablet Take 180 mg by mouth daily.      Marland Kitchen gabapentin (NEURONTIN) 400 MG capsule Take 400 mg by mouth. Take  4 by mouth daily      . levalbuterol (XOPENEX HFA) 45 MCG/ACT inhaler Inhale 1-2 puffs into the lungs every 4 (four) hours as needed for wheezing.  1 Inhaler  12  . levothyroxine (SYNTHROID, LEVOTHROID) 75 MCG tablet Take 1 tablet (75 mcg total) by mouth daily before breakfast.  30 tablet  3  . lisinopril (PRINIVIL,ZESTRIL) 10 MG tablet Take 1 tablet (10 mg total) by mouth daily.  30 tablet  2  . meloxicam (MOBIC) 15 MG tablet Take one to two tablets by mouth daily  90 tablet  0  . metoprolol succinate (TOPROL-XL) 50 MG 24 hr tablet Take 1 tablet (50 mg total) by mouth daily. Take with or immediately following a meal.  30 tablet  3  . montelukast (SINGULAIR) 10 MG tablet Take 1 tablet (10 mg total) by mouth at bedtime.  30 tablet  3  . oxyCODONE-acetaminophen (PERCOCET) 10-325 MG per tablet Take 1 tablet by mouth every 4 (four) hours as needed for pain.  30 tablet  0  . tiZANidine (ZANAFLEX) 4 MG tablet Take 4 mg by mouth daily as needed for muscle spasms.      Marland Kitchen topiramate (TOPAMAX) 100 MG tablet  Take 1 tablet (100 mg total) by mouth daily.  30 tablet  3  . UNABLE TO FIND 2 capsules 3 (three) times daily before meals. Lipozene (weight loss)      . Coenzyme Q10 200 MG TABS One tablet daily      . pravastatin (PRAVACHOL) 20 MG tablet Take 1 tablet (20 mg total) by mouth every evening.  30 tablet  3   No current facility-administered medications for this visit.    BP 120/54  Pulse 61  Ht 6\' 2"  (1.88 m)  Wt 133.811 kg (295 lb)  BMI 37.86 kg/m2 General: NAD, obese Neck: thick, no JVD, no thyromegaly or thyroid nodule.  Lungs: Clear to auscultation bilaterally with normal respiratory effort. CV: Nondisplaced PMI.  Heart regular S1/S2, no S3/S4, no murmur.  No peripheral edema.  No carotid bruit.  Normal pedal pulses.  Abdomen: Soft, nontender, no hepatosplenomegaly, no distention.  Skin: Intact without lesions or rashes.  Neurologic: Alert and oriented x 3.  Psych: Normal  affect. Extremities: No clubbing or cyanosis.   Assessment/Plan: 1. Exertional dyspnea: Stable.  Echo and Cardiolite unremarkable in 9/14.  Suspect COPD and obesity/deconditioning both play major roles.  2. HTN: BP appears controlled on current regimen.  3. Hyperlipidemia: He has not tolerate simvastatin or atorvastatin.  Given strong family history of premature CAD, I am going to try him on pravastatin 20 mg with coenzyme Q10 200 mg daily.  Check lipids in 2 months.   Loralie Champagne 12/11/2013

## 2013-12-12 DIAGNOSIS — R0600 Dyspnea, unspecified: Secondary | ICD-10-CM | POA: Insufficient documentation

## 2013-12-12 NOTE — Addendum Note (Signed)
Addended by: Katrine Coho on: 12/12/2013 08:30 AM   Modules accepted: Orders

## 2014-01-07 ENCOUNTER — Ambulatory Visit (INDEPENDENT_AMBULATORY_CARE_PROVIDER_SITE_OTHER): Payer: BC Managed Care – PPO | Admitting: Family Medicine

## 2014-01-07 ENCOUNTER — Encounter: Payer: Self-pay | Admitting: Family Medicine

## 2014-01-07 VITALS — BP 112/68 | HR 60 | Temp 98.0°F | Ht 74.0 in | Wt 300.5 lb

## 2014-01-07 DIAGNOSIS — M76899 Other specified enthesopathies of unspecified lower limb, excluding foot: Secondary | ICD-10-CM

## 2014-01-07 DIAGNOSIS — M7062 Trochanteric bursitis, left hip: Secondary | ICD-10-CM

## 2014-01-07 NOTE — Progress Notes (Signed)
Date:  01/07/2014   Name:  Corey Bell   DOB:  26-Apr-1966   MRN:  [id number]  Primary Physician:  Arnette Norris, MD   Chief Complaint: Bursitis   Subjective:   History of Present Illness:  Corey Bell is a 48 y.o. pleasant patient who presents with the following:  Patient complaints of L lateral hip pain that has been ongoing now for several months. No distinct trauma or injury. No groin pain. Chronic back pain. No numbness or acknowledged weakness. Prior operative interventions or fractures in the joints above or below: none (but back surgery) Prior meds: OTC NSAIDS and Tylenol, narcotics HEP or PT: none Prior injection: y Activity level: minimal  Patient Active Problem List   Diagnosis Date Noted  . Dyspnea 12/12/2013  . Right shoulder pain 10/28/2013  . OSA (obstructive sleep apnea) 07/01/2013  . Essential hypertension 05/29/2013  . Hyperlipidemia 05/29/2013  . Exertional dyspnea 05/29/2013  . Family history of heart disease 05/28/2013  . Depression 05/28/2013  . Tremor 05/28/2013  . Anxiety   . Lumbar degenerative disc disease   . Chronic pain syndrome   . Hypothyroidism   . 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 Carpal tunnel syndrome, bilateral   . H/O acute prostatitis   . Unspecified essential hypertension   . Diverticulitis of colon   . Esophageal reflux   . Mixed hyperlipidemia   . 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 Seasonal allergies   . Aphthous ulcer 10-04-12    improving  . Chronic pain syndrome   . Insomnia, unspecified   . Tympanic membrane perforation     history of  . Anxiety   . Lumbar degenerative disc disease   . Hypothyroidism   . COPD (chronic obstructive pulmonary disease)   . Sleep apnea     uses cpap  . Headache(784.0)     hx of migraines    Past Surgical History  Procedure Laterality Date  . Back surgery    . Tympanostomy tube placement  1975  . Knee surgery      torn  meniscus  . Nesbit procedure N/A 08/22/2013    Procedure: 16 DOT PLACTATION;  Surgeon: Claybon Jabs, MD;  Location: [facility];  Service: Urology;  Laterality: N/A;  . Vasectomy Bilateral 08/22/2013    Procedure: VASECTOMY;  Surgeon: Claybon Jabs, MD;  Location: [facility];  Service: Urology;  Laterality: Bilateral;    History   Social History  . Marital Status: Married    Spouse Name: [name]    Number of Children: 0  . Years of Education: college   Occupational History  . MAINTENANCE SUPER    Social History Main Topics  . Smoking status: Former Smoker -- 1.50 packs/day for 30 years    Types: Cigarettes  . Smokeless tobacco: Never Used  . Alcohol Use: 0.6 oz/week    1 Cans of beer per week     Comment: rarely  . Drug Use: No  . Sexual Activity: Not on file   Other Topics Concern  . Not on file   Social History Narrative   Married.  [name])  No children. Patient has Best boy.   In process of getting disability for low back pain.   Right handed.   Caffeine- Coffee a pot but not every day.    Family History  Problem Relation Age of Onset  . Bladder  Cancer Paternal Uncle   . Renal cancer Paternal Uncle     renal cell carcinoma  . Congestive Heart Failure Father   . Heart disease Father   . COPD Father   . Heart disease Brother   . Heart disease Brother     MI at 50  . Renal cancer Paternal Grandfather     renal call carcinoma  . Heart disease Brother     stents at 30 yo  . Heart attack Brother 46    Allergies  Allergen Reactions  . Augmentin [Amoxicillin-Pot Clavulanate] Diarrhea  . Demerol [Meperidine]     "goes crazy and has the strength of 10 men"  . Eggs Or Egg-Derived Products Nausea Only  . Statins Rash  . Sulfa Antibiotics Rash    Medication list has been reviewed and updated.  Review of Systems:  GEN: No fevers, chills. Nontoxic. Primarily MSK c/o today. MSK: Detailed in the HPI GI: tolerating PO  intake without difficulty Neuro: No numbness, parasthesias, or tingling associated. Otherwise the pertinent positives of the ROS are noted above.   Objective:   Physical Examination: BP 112/68  Pulse 60  Temp(Src) 98 F (36.7 C) (Oral)  Ht 6\' 2"  (1.88 m)  Wt 300 lb 8 oz (136.306 kg)  BMI 38.57 kg/m2  Ideal Body Weight: Weight in (lb) to have BMI = 25: 194.3   GEN: WDWN, NAD, Non-toxic, Alert & Oriented x 3 HEENT: Atraumatic, Normocephalic.  Ears and Nose: No external deformity. EXTR: No clubbing/cyanosis/edema NEURO: Normal gait.  PSYCH: Normally interactive. Conversant. Not depressed or anxious appearing.  Calm demeanor.   HIP EXAM: SIDE: L ROM: Abduction, Flexion, Internal and External range of motion: full Pain with terminal IROM and EROM: no GTB: marked on L SLR: NEG Knees: No effusion FABER: NT REVERSE FABER: NT, neg Piriformis: NT at direct palpation Str: flexion: 5/5 abduction: 5/5 adduction: 5/5 Strength testing non-tender     No results found.  Assessment & Plan:   Greater trochanteric bursitis of left hip  I reviewed with the patient the structures involved and how they related to diagnosis.   Patient was given a rehabilitation protocol emphasizing core rehab, partcularly addressing abductor weakness, and stretching of the pelvic musculature, hips, and ITB.  Trochanteric bursa injections have clearly been shown to help with acute bursitis, and the patient would benefit.   Trochanteric Bursitis Injection, LEFT Verbal consent obtained. Risks (including infection, potential atrophy), benefits, and alternatives reviewed. Greater trochanter sterilely prepped with Chloraprep. Ethyl Chloride used for anesthesia. 8 cc of Lidocaine 1% injected with 2 cc of 40 mg Depo-Medrol into trochanteric bursa at area of maximal tenderness at greater trochanter. Needle taken to bone to troch bursa, flows easily. Bursa massaged. No bleeding and no complications. Decreased  pain after injection. Needle: 22 gauge spinal needle   No Follow-up on file.  No orders of the defined types were placed in this encounter.   Patient's Medications  New Prescriptions   No medications on file  Previous Medications   ASPIRIN 325 MG TABLET    Take 2 by mouth daily   BUPROPION (WELLBUTRIN XL) 300 MG 24 HR TABLET    TAKE ONE (1) TABLET EACH DAY   CITALOPRAM (CELEXA) 40 MG TABLET    Take 1 tablet (40 mg total) by mouth daily.   COENZYME Q10 200 MG TABS    One tablet daily   FEXOFENADINE (ALLEGRA) 180 MG TABLET    Take 180 mg by mouth daily.  GABAPENTIN (NEURONTIN) 400 MG CAPSULE    Take 400 mg by mouth. Take 4 by mouth daily   LEVALBUTEROL (XOPENEX HFA) 45 MCG/ACT INHALER    Inhale 1-2 puffs into the lungs every 4 (four) hours as needed for wheezing.   LEVOTHYROXINE (SYNTHROID, LEVOTHROID) 75 MCG TABLET    Take 1 tablet (75 mcg total) by mouth daily before breakfast.   LISINOPRIL (PRINIVIL,ZESTRIL) 10 MG TABLET    Take 1 tablet (10 mg total) by mouth daily.   METOPROLOL SUCCINATE (TOPROL-XL) 50 MG 24 HR TABLET    Take 1 tablet (50 mg total) by mouth daily. Take with or immediately following a meal.   MONTELUKAST (SINGULAIR) 10 MG TABLET    Take 1 tablet (10 mg total) by mouth at bedtime.   OXYCODONE-ACETAMINOPHEN (PERCOCET) 10-325 MG PER TABLET    Take 1 tablet by mouth every 4 (four) hours as needed for pain.   PRAVASTATIN (PRAVACHOL) 40 MG TABLET    Take 40 mg by mouth daily.   TIZANIDINE (ZANAFLEX) 4 MG TABLET    Take 4 mg by mouth daily as needed for muscle spasms.   TOPIRAMATE (TOPAMAX) 100 MG TABLET    Take 1 tablet (100 mg total) by mouth daily.   UNABLE TO FIND    2 capsules 3 (three) times daily before meals. Lipozene (weight loss)  Modified Medications   Modified Medication Previous Medication   MELOXICAM (MOBIC) 15 MG TABLET meloxicam (MOBIC) 15 MG tablet      Take one to two tablets by mouth daily as needed    Take one to two tablets by mouth daily    Discontinued Medications   CEFUROXIME (CEFTIN) 500 MG TABLET    Take 1 tablet (500 mg total) by mouth 2 (two) times daily with a meal.   PRAVASTATIN (PRAVACHOL) 20 MG TABLET    Take 1 tablet (20 mg total) by mouth every evening.   There are no Patient Instructions on file for this visit.  Signed,  Maud Deed. Laurann Mcmorris, MD, [place] [facility] [place] [address] Phone: [phone] Fax: [phone]

## 2014-01-07 NOTE — Progress Notes (Signed)
Pre visit review using our clinic review tool, if applicable. No additional management support is needed unless otherwise documented below in the visit note. 

## 2014-01-08 ENCOUNTER — Ambulatory Visit (INDEPENDENT_AMBULATORY_CARE_PROVIDER_SITE_OTHER): Payer: BC Managed Care – PPO | Admitting: Family Medicine

## 2014-01-08 ENCOUNTER — Encounter: Payer: Self-pay | Admitting: Family Medicine

## 2014-01-08 VITALS — BP 118/62 | HR 53 | Temp 97.3°F | Wt 298.5 lb

## 2014-01-08 DIAGNOSIS — R51 Headache: Secondary | ICD-10-CM

## 2014-01-08 DIAGNOSIS — R251 Tremor, unspecified: Secondary | ICD-10-CM

## 2014-01-08 DIAGNOSIS — R413 Other amnesia: Secondary | ICD-10-CM | POA: Insufficient documentation

## 2014-01-08 DIAGNOSIS — R259 Unspecified abnormal involuntary movements: Secondary | ICD-10-CM

## 2014-01-08 DIAGNOSIS — R519 Headache, unspecified: Secondary | ICD-10-CM

## 2014-01-08 LAB — COMPREHENSIVE METABOLIC PANEL
ALT: 64 U/L — AB (ref 0–53)
AST: 40 U/L — AB (ref 0–37)
Albumin: 4.1 g/dL (ref 3.5–5.2)
Alkaline Phosphatase: 70 U/L (ref 39–117)
BILIRUBIN TOTAL: 1.1 mg/dL (ref 0.3–1.2)
BUN: 17 mg/dL (ref 6–23)
CO2: 26 mEq/L (ref 19–32)
CREATININE: 1.3 mg/dL (ref 0.4–1.5)
Calcium: 9.5 mg/dL (ref 8.4–10.5)
Chloride: 102 mEq/L (ref 96–112)
GFR: 65.14 mL/min (ref >60.00)
Glucose, Bld: 253 mg/dL — ABNORMAL HIGH (ref 70–99)
Potassium: 4.7 mEq/L (ref 3.5–5.1)
Sodium: 137 mEq/L (ref 135–145)
Total Protein: 7.5 g/dL (ref 6.0–8.3)

## 2014-01-08 LAB — CBC WITH DIFFERENTIAL/PLATELET
BASOS PCT: 0.2 % (ref 0.0–3.0)
Basophils Absolute: 0 10*3/uL (ref 0.0–0.1)
EOS PCT: 0.7 % (ref 0.0–5.0)
Eosinophils Absolute: 0.1 10*3/uL (ref 0.0–0.7)
HEMATOCRIT: 44.1 % (ref 39.0–52.0)
Hemoglobin: 14.9 g/dL (ref 13.0–17.0)
Lymphocytes Relative: 15.3 % (ref 12.0–46.0)
Lymphs Abs: 1.6 10*3/uL (ref 0.7–4.0)
MCHC: 33.7 g/dL (ref 30.0–36.0)
MCV: 93.7 fl (ref 78.0–100.0)
MONO ABS: 0.6 10*3/uL (ref 0.1–1.0)
Monocytes Relative: 5.8 % (ref 3.0–12.0)
NEUTROS PCT: 78 % — AB (ref 43.0–77.0)
Neutro Abs: 7.9 10*3/uL — ABNORMAL HIGH (ref 1.4–7.7)
Platelets: 218 10*3/uL (ref 150.0–400.0)
RBC: 4.71 Mil/uL (ref 4.22–5.81)
RDW: 13.3 % (ref 11.5–14.6)
WBC: 10.2 10*3/uL (ref 4.5–10.5)

## 2014-01-08 LAB — TSH: TSH: 0.39 u[IU]/mL (ref 0.35–5.50)

## 2014-01-08 LAB — VITAMIN B12: Vitamin B-12: 421 pg/mL (ref 211–911)

## 2014-01-08 NOTE — Progress Notes (Signed)
Pre visit review using our clinic review tool, if applicable. No additional management support is needed unless otherwise documented below in the visit note. 

## 2014-01-08 NOTE — Assessment & Plan Note (Signed)
Etiology unclear at this point but he feels it is progressive.  No tremor on exam today and exam otherwise benign.  Given symptoms of HA and memory loss as well, will order MRI of brain and lab work to rule out neuro process and vit deficiency. If normal, may need neuro referral/EMG/EEG. The patient indicates understanding of these issues and agrees with the plan.

## 2014-01-08 NOTE — Progress Notes (Signed)
Subjective:   Patient ID: Corey Bell, male    DOB: September 06, 1966, 48 y.o.   MRN: [id number]  Corey Bell is a pleasant 48 y.o. year old male who presents to clinic today with Tremors and Memory Loss  on 01/08/2014  HPI: 5 months of progressive symptoms.  Will get a left sided headache and bilateral hand tremors- often times these episodes are associated with short term and long term memory loss and wording finding difficulty.  He feels his memory loss has been progressive even without the tremor and HA episodes.  Gives examples of both short and long term memory loss- avid reader.  Read an article and wanted to tell his wife about it but could not remember what he just read. Also loves music trivia.  Lately, listening to the radio, he cannot remember names of artists he has known for years.  No UE weakness that he is aware of although he did drop a book when he had a tremor the other day.  Tremor rarely occurs at rest.  No known seizure like activity.  Patient Active Problem List   Diagnosis Date Noted  . Memory loss 01/08/2014  . Headache 01/08/2014  . Dyspnea 12/12/2013  . Right shoulder pain 10/28/2013  . OSA (obstructive sleep apnea) 07/01/2013  . Essential hypertension 05/29/2013  . Hyperlipidemia 05/29/2013  . Exertional dyspnea 05/29/2013  . Family history of heart disease 05/28/2013  . Depression 05/28/2013  . Tremor 05/28/2013  . Anxiety   . Lumbar degenerative disc disease   . Chronic pain syndrome   . Hypothyroidism   . 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 Carpal tunnel syndrome, bilateral   . H/O acute prostatitis   . Unspecified essential hypertension   . Diverticulitis of colon   . Esophageal reflux   . Mixed hyperlipidemia   . 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 Seasonal allergies   . Aphthous ulcer 10-04-12    improving  . Chronic pain syndrome   . Insomnia, unspecified   . Tympanic membrane perforation      history of  . Anxiety   . Lumbar degenerative disc disease   . Hypothyroidism   . COPD (chronic obstructive pulmonary disease)   . Sleep apnea     uses cpap  . Headache(784.0)     hx of migraines   Past Surgical History  Procedure Laterality Date  . Back surgery    . Tympanostomy tube placement  1975  . Knee surgery      torn meniscus  . Nesbit procedure N/A 08/22/2013    Procedure: 16 DOT PLACTATION;  Surgeon: Claybon Jabs, MD;  Location[facility];  Service: Urology;  Laterality: N/A;  . Vasectomy Bilateral 08/22/2013    Procedure: VASECTOMY;  Surgeon: Claybon Jabs, MD;  Location: [facility];  Service: Urology;  Laterality: Bilateral;   History  Substance Use Topics  . Smoking status: Former Smoker -- 1.50 packs/day for 30 years    Types: Cigarettes  . Smokeless tobacco: Never Used  . Alcohol Use: 0.6 oz/week    1 Cans of beer per week     Comment: rarely   Family History  Problem Relation Age of Onset  . Bladder Cancer Paternal Uncle   . Renal cancer Paternal Uncle     renal cell carcinoma  . Congestive Heart Failure Father   . Heart disease Father   .  COPD Father   . Heart disease Brother   . Heart disease Brother     MI at 45  . Renal cancer Paternal Grandfather     renal call carcinoma  . Heart disease Brother     stents at 18 yo  . Heart attack Brother 46   Allergies  Allergen Reactions  . Augmentin [Amoxicillin-Pot Clavulanate] Diarrhea  . Demerol [Meperidine]     "goes crazy and has the strength of 10 men"  . Eggs Or Egg-Derived Products Nausea Only  . Statins Rash  . Sulfa Antibiotics Rash   Current Outpatient Prescriptions on File Prior to Visit  Medication Sig Dispense Refill  . aspirin 325 MG tablet Take 2 by mouth daily      . buPROPion (WELLBUTRIN XL) 300 MG 24 hr tablet TAKE ONE (1) TABLET EACH DAY  30 tablet  6  . citalopram (CELEXA) 40 MG tablet Take 1 tablet (40 mg total) by mouth daily.  30 tablet   2  . Coenzyme Q10 200 MG TABS One tablet daily      . fexofenadine (ALLEGRA) 180 MG tablet Take 180 mg by mouth daily.      Marland Kitchen gabapentin (NEURONTIN) 400 MG capsule Take 400 mg by mouth. Take 4 by mouth daily      . levalbuterol (XOPENEX HFA) 45 MCG/ACT inhaler Inhale 1-2 puffs into the lungs every 4 (four) hours as needed for wheezing.  1 Inhaler  12  . levothyroxine (SYNTHROID, LEVOTHROID) 75 MCG tablet Take 1 tablet (75 mcg total) by mouth daily before breakfast.  30 tablet  3  . lisinopril (PRINIVIL,ZESTRIL) 10 MG tablet Take 1 tablet (10 mg total) by mouth daily.  30 tablet  2  . meloxicam (MOBIC) 15 MG tablet Take one to two tablets by mouth daily as needed      . metoprolol succinate (TOPROL-XL) 50 MG 24 hr tablet Take 1 tablet (50 mg total) by mouth daily. Take with or immediately following a meal.  30 tablet  3  . montelukast (SINGULAIR) 10 MG tablet Take 1 tablet (10 mg total) by mouth at bedtime.  30 tablet  3  . oxyCODONE-acetaminophen (PERCOCET) 10-325 MG per tablet Take 1 tablet by mouth every 4 (four) hours as needed for pain.  30 tablet  0  . pravastatin (PRAVACHOL) 40 MG tablet Take 40 mg by mouth daily.      Marland Kitchen tiZANidine (ZANAFLEX) 4 MG tablet Take 4 mg by mouth daily as needed for muscle spasms.      Marland Kitchen topiramate (TOPAMAX) 100 MG tablet Take 1 tablet (100 mg total) by mouth daily.  30 tablet  3  . UNABLE TO FIND 2 capsules 3 (three) times daily before meals. Lipozene (weight loss)       No current facility-administered medications on file prior to visit.   The PMH, PSH, Social History, Family History, Medications, and allergies have been reviewed in [facility], and have been updated if relevant.   Review of Systems    See HPI No slurred speech No bowel or bladder issues No photophobia or blurred vision Objective:    BP 118/62  Pulse 53  Temp(Src) 97.3 F (36.3 C) (Oral)  Wt 298 lb 8 oz (135.399 kg)  SpO2 96%   Physical Exam  Nursing note and vitals  reviewed. Constitutional: He is oriented to person, place, and time. He appears well-developed and well-nourished. No distress.  HENT:  Head: Normocephalic.  Eyes: Pupils are equal, round, and reactive  to light.  Neurological: He is alert and oriented to person, place, and time. He is not disoriented. He displays normal reflexes. No cranial nerve deficit or sensory deficit.  Normal gait  Psychiatric: He has a normal mood and affect. His speech is normal and behavior is normal. Judgment and thought content normal. Cognition and memory are normal.          Assessment & Plan:   Tremor - Plan: MR Brain W Wo Contrast, Comprehensive metabolic panel, TSH, Vitamin B12, CBC with Differential  Memory loss - Plan: MR Brain W Wo Contrast, Comprehensive metabolic panel, TSH, Vitamin B12, CBC with Differential  Headache - Plan: MR Brain W Wo Contrast, Comprehensive metabolic panel, TSH, Vitamin B12, CBC with Differential No Follow-up on file.

## 2014-01-08 NOTE — Patient Instructions (Signed)
Good to see you. Please stop by to see Corey Bell on your way out to set up your MRI.  I will call you with your lab results and MRI results.

## 2014-01-12 ENCOUNTER — Other Ambulatory Visit: Payer: Self-pay | Admitting: Internal Medicine

## 2014-01-12 NOTE — Telephone Encounter (Signed)
Last prescribed 12/14 with 2 refills--last OV 01/08/14--please advise

## 2014-01-15 ENCOUNTER — Ambulatory Visit: Payer: BC Managed Care – PPO | Admitting: Neurology

## 2014-01-21 ENCOUNTER — Ambulatory Visit
Admission: RE | Admit: 2014-01-21 | Discharge: 2014-01-21 | Disposition: A | Discharge status: Home / Self Care | Payer: BC Managed Care – PPO | Source: Ambulatory Visit | Attending: Family Medicine | Admitting: Family Medicine

## 2014-01-21 ENCOUNTER — Ambulatory Visit: Payer: BC Managed Care – PPO | Admitting: Neurology

## 2014-01-21 DIAGNOSIS — R519 Headache, unspecified: Secondary | ICD-10-CM

## 2014-01-21 DIAGNOSIS — R251 Tremor, unspecified: Secondary | ICD-10-CM

## 2014-01-21 DIAGNOSIS — R413 Other amnesia: Secondary | ICD-10-CM

## 2014-01-21 DIAGNOSIS — R51 Headache: Secondary | ICD-10-CM

## 2014-01-21 MED ORDER — GADOBENATE DIMEGLUMINE 529 MG/ML IV SOLN
20.0000 mL | Freq: Once | INTRAVENOUS | Status: AC | PRN
  Administered 2014-01-21: 20 mL via INTRAVENOUS

## 2014-02-02 ENCOUNTER — Telehealth: Payer: Self-pay | Admitting: Cardiology

## 2014-02-02 NOTE — Telephone Encounter (Signed)
Pt has been taking pravastatin. He has an old right shoulder injury and recently began to have cramps in his right shoulder blade. He stopped taking pravastatin last Thursday night and the cramps are improving. He feels like the shoulder cramps may be due to pravastatin. Pt asking what to do about the pravastatin. I will forward to Dr Aundra Dubin for review.

## 2014-02-02 NOTE — Telephone Encounter (Signed)
Try going back on it every other day.

## 2014-02-02 NOTE — Telephone Encounter (Signed)
LMTCB

## 2014-02-02 NOTE — Telephone Encounter (Signed)
Patient was taking statin drug was having bad shoulder cramps. He stopped taking on Wednesday and felt much better by Sunday. Please call and advise.

## 2014-02-04 NOTE — Telephone Encounter (Signed)
Spoke with patient and he said he would try every other day. He will let me know if this does not work.

## 2014-02-06 ENCOUNTER — Ambulatory Visit (INDEPENDENT_AMBULATORY_CARE_PROVIDER_SITE_OTHER): Payer: BC Managed Care – PPO | Admitting: Neurology

## 2014-02-06 ENCOUNTER — Encounter: Payer: Self-pay | Admitting: Neurology

## 2014-02-06 VITALS — BP 114/66 | HR 60 | Ht 72.0 in | Wt 308.0 lb

## 2014-02-06 DIAGNOSIS — F09 Unspecified mental disorder due to known physiological condition: Secondary | ICD-10-CM

## 2014-02-06 DIAGNOSIS — R4189 Other symptoms and signs involving cognitive functions and awareness: Secondary | ICD-10-CM

## 2014-02-06 DIAGNOSIS — R251 Tremor, unspecified: Secondary | ICD-10-CM

## 2014-02-06 DIAGNOSIS — R259 Unspecified abnormal involuntary movements: Secondary | ICD-10-CM

## 2014-02-06 DIAGNOSIS — G4733 Obstructive sleep apnea (adult) (pediatric): Secondary | ICD-10-CM

## 2014-02-06 NOTE — Patient Instructions (Signed)
Overall you are doing fairly well but I do want to suggest a few things today:   Remember to drink plenty of fluid, eat healthy meals and do not skip any meals. Try to eat protein with a every meal and eat a healthy snack such as fruit or nuts in between meals. Try to keep a regular sleep-wake schedule and try to exercise daily, particularly in the form of walking, 20-30 minutes a day, if you can.   Please have some blood work checked today  I would like you to have formal cognitive testing. You will be called to schedule this  I would like to see you back once the formal testing is completed, sooner if we need to. Please call us with any interim questions, concerns, problems, updates or refill requests.   My clinical assistant and will answer any of your questions and relay your messages to me and also relay most of my messages to you.   Our phone number is [phone]. We also have an after hours call service for urgent matters and there is a physician on-call for urgent questions. For any emergencies you know to call 911 or go to the nearest emergency room

## 2014-02-06 NOTE — Progress Notes (Signed)
[facility]    Provider:  Dr Janann Colonel Referring Provider: Lucille Passy, MD Primary Care Physician:  Arnette Norris, MD  CC:  tremor  HPI:  Corey Bell is a 48 y.o. male here as a follow up from Dr. Deborra Medina for tremor. He notes the tremor predominantly when he is reading, notes when he is holding the book and it will start shaking, or if he is holding a glass it will start shaking. He notes a lot of difficulty using the keyboard, has trouble hitting the right keys. Feels he has slowed down overall but notes he has a history of severe back pain. He is a "fidgety" person, has been for his whole life.   Notes some memory issues, having difficulty with short term memory, forgets things quickly. States he has OSA and has a CPAP machine. Reports he sleeps great when he is using the machine, if he doesn't use it then he snores and thrashes around in the bed. He talks in his sleep, has improved since starting the CPAP.    Initial visit 07/2013: [name], is not there in the morning, comes around lunch time and gets worse as day goes on. Notices it the nmost when trying to hold a book. Wife notices a very fine rest tremor but is much more a predominantly action and postural tremor. Difficulty eating, [name], [name], [name], [name]   Father has "tremors", started in his 11s.   Has RBD and sleep apnea, recently started CPAP which has improved symptoms.   Review of Systems: Out of a complete 14 system review, the  patient complains of only the following symptoms, and all other reviewed systems are negative. Positive for shortness of breath snoring tremor   History   Social History  . Marital Status: Married    Spouse Name: [name]    Number of Children: 0  . Years of Education: college   Occupational History  . MAINTENANCE SUPER    Social History Main Topics  . Smoking status: Former Smoker -- 1.50 packs/day for 30 years    Types: Cigarettes  . Smokeless tobacco: Never Used  . Alcohol Use: 0.6 oz/week    1 Cans of beer per week     Comment: rarely  . Drug Use: No  . Sexual Activity: Not on file   Other Topics Concern  . Not on file   Social History Narrative   Married.  [name])  No children. Patient has Best boy.   In process of getting disability for low back pain.   Right handed.   Caffeine- Coffee a pot but not every day.    Family History  Problem Relation Age of Onset  . Bladder Cancer Paternal Uncle   . Renal cancer Paternal Uncle     renal cell carcinoma  . Congestive Heart Failure Father   . Heart disease Father   . COPD Father   . Heart disease Brother   . Heart disease Brother     MI at 71  . Renal cancer Paternal Grandfather  renal call carcinoma  . Heart disease Brother     stents at 75 yo  . Heart attack Brother 62    Past Medical History  Diagnosis Date  . Carpal tunnel syndrome, bilateral   . H/O acute prostatitis   . Unspecified essential hypertension   . Diverticulitis of colon   . Esophageal reflux   . Mixed hyperlipidemia   . 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 Seasonal allergies   . Aphthous ulcer 10-04-12    improving  . Chronic pain syndrome   . Insomnia, unspecified   . Tympanic membrane perforation     history of  . Anxiety   . Lumbar degenerative disc disease   . Hypothyroidism   . COPD (chronic obstructive pulmonary disease)   . Sleep apnea     uses cpap  . Headache(784.0)     hx of migraines    Past  Surgical History  Procedure Laterality Date  . Back surgery    . Tympanostomy tube placement  1975  . Knee surgery      torn meniscus  . Nesbit procedure N/A 08/22/2013    Procedure: 16 DOT PLACTATION;  Surgeon: Claybon Jabs, MD;  Location: [facility];  Service: Urology;  Laterality: N/A;  . Vasectomy Bilateral 08/22/2013    Procedure: VASECTOMY;  Surgeon: Claybon Jabs, MD;  Location: [facility];  Service: Urology;  Laterality: Bilateral;    Current Outpatient Prescriptions  Medication Sig Dispense Refill  . aspirin 325 MG tablet Take 2 by mouth daily      . buPROPion (WELLBUTRIN XL) 300 MG 24 hr tablet TAKE ONE (1) TABLET EACH DAY  30 tablet  6  . citalopram (CELEXA) 40 MG tablet TAKE 1 TABLET BY MOUTH DAILY  30 tablet  6  . Coenzyme Q10 200 MG TABS One tablet daily      . fexofenadine (ALLEGRA) 180 MG tablet Take 180 mg by mouth daily.      Marland Kitchen gabapentin (NEURONTIN) 400 MG capsule Take 400 mg by mouth. Take 4 by mouth daily      . levalbuterol (XOPENEX HFA) 45 MCG/ACT inhaler Inhale 1-2 puffs into the lungs every 4 (four) hours as needed for wheezing.  1 Inhaler  12  . levothyroxine (SYNTHROID, LEVOTHROID) 75 MCG tablet Take 1 tablet (75 mcg total) by mouth daily before breakfast.  30 tablet  3  . lisinopril (PRINIVIL,ZESTRIL) 10 MG tablet Take 1 tablet (10 mg total) by mouth daily.  30 tablet  2  . meloxicam (MOBIC) 15 MG tablet Take one to two tablets by mouth daily as needed      . metoprolol succinate (TOPROL-XL) 50 MG 24 hr tablet Take 1 tablet (50 mg total) by mouth daily. Take with or immediately following a meal.  30 tablet  3  . montelukast (SINGULAIR) 10 MG tablet Take 1 tablet (10 mg total) by mouth at bedtime.  30 tablet  3  . oxyCODONE-acetaminophen (PERCOCET) 10-325 MG per tablet Take 1 tablet by mouth every 4 (four) hours as needed for pain.  30 tablet  0  . tiZANidine (ZANAFLEX) 4 MG tablet Take 4 mg by mouth daily as needed for muscle  spasms.      Marland Kitchen topiramate (TOPAMAX) 100 MG tablet Take 1 tablet (100 mg total) by mouth daily.  30 tablet  3  . UNABLE TO FIND 2 capsules 3 (three) times daily before meals. Lipozene (weight loss)  No current facility-administered medications for this visit.    Allergies as of 02/06/2014 - Review Complete 02/06/2014  Allergen Reaction Noted  . Augmentin [amoxicillin-pot clavulanate] Diarrhea 11/24/2013  . Demerol [meperidine]  07/01/2013  . Eggs or egg-derived products Nausea Only   . Statins Rash   . Sulfa antibiotics Rash     Vitals: BP 114/66  Pulse 60  Ht 6' (1.829 m)  Wt 308 lb (139.708 kg)  BMI 41.76 kg/m2 Last Weight:  Wt Readings from Last 1 Encounters:  02/06/14 308 lb (139.708 kg)   Last Height:   Ht Readings from Last 1 Encounters:  02/06/14 6' (1.829 m)     Physical exam: Exam: Gen: NAD, conversant Eyes: anicteric sclerae, moist conjunctivae HENT: Atraumatic, oropharynx clear Neck: Trachea midline; supple,  Lungs: CTA, no wheezing, rales, rhonic                          CV: RRR, no MRG Abdomen: Soft, non-tender;  Extremities: No peripheral edema  Skin: Normal temperature, no rash,  Psych: Appropriate affect, pleasant  Neuro: MS: AA&Ox3, appropriately interactive, normal affect   Speech: fluent w/o paraphasic error  Memory: good recent and remote recall  CN: PERRL, EOMI no nystagmus, no ptosis, sensation intact to LT V1-V3 bilat, face symmetric, no weakness, hearing grossly intact, palate elevates symmetrically, shoulder shrug 5/5 bilat,  tongue protrudes midline, no fasiculations noted.  Motor: normal bulk and tone Strength: 5/5  In all extremities  Coord: rapid alternating and point-to-point (FNF, HTS) movements intact. No rest tremor noted, mild choreiform movements bilateral LE, mild asterixis with arms extended, minimal postural tremor bilateral hands, no intention tremor  Mild bradykinesia with finger tapping L>R, bradykinesia with  open/close hand on left>right, normal foot taps  Reflexes: symmetrical, bilat downgoing toes  Sens: LT intact in all extremities  Gait: posture, stance, stride normal, mild decrease arm swing on R>L, minimal re-emergent tremor on left hand   Assessment:  After physical and neurologic examination, review of laboratory studies, imaging, neurophysiology testing and pre-existing records, assessment will be reviewed on the problem list.  Plan:  Treatment plan and additional workup will be reviewed under Problem List.   1)Tremor 2)OSA 3)Cognitive decline  Mr Battie is a pleasant 48y/o presenting for follow up evaluation of tremor and cognitive decline. He feels symptoms have progressed since prior visit. Unclear etiology of symptoms, patient appears "fidgety" with some asterixis noted. Exam also pertinent for mild bradykinesia and decreased arm swing while walking. He does have some parkinsonian features on exam but not enough to make a clinical diagnosis at this time. Will check ammonia, ceruloplasmin. Will refer for formal cognitive testing with neuro-psychology. Will follow up once testing completed. If symptoms progress would consider trial of low dose L-dopa or dopamine agonist.

## 2014-02-07 LAB — CERULOPLASMIN: Ceruloplasmin: 17.4 mg/dL (ref 15.0–30.0)

## 2014-02-07 LAB — AMMONIA: AMMONIA: 117 ug/dL — AB (ref 27–102)

## 2014-02-09 ENCOUNTER — Other Ambulatory Visit: Payer: Self-pay | Admitting: Neurology

## 2014-02-09 DIAGNOSIS — R251 Tremor, unspecified: Secondary | ICD-10-CM

## 2014-02-11 ENCOUNTER — Other Ambulatory Visit: Payer: BC Managed Care – PPO

## 2014-02-20 ENCOUNTER — Other Ambulatory Visit: Payer: Self-pay | Admitting: Internal Medicine

## 2014-02-20 NOTE — Telephone Encounter (Signed)
This was last filled 12/14 by Lubertha Basque advise if okay to refill as i did not see a history on where you had prescribed the medication--

## 2014-02-23 ENCOUNTER — Other Ambulatory Visit: Payer: BC Managed Care – PPO

## 2014-02-23 ENCOUNTER — Other Ambulatory Visit (INDEPENDENT_AMBULATORY_CARE_PROVIDER_SITE_OTHER): Payer: Self-pay

## 2014-02-23 DIAGNOSIS — R251 Tremor, unspecified: Secondary | ICD-10-CM

## 2014-02-23 DIAGNOSIS — Z0289 Encounter for other administrative examinations: Secondary | ICD-10-CM

## 2014-02-24 LAB — COMPREHENSIVE METABOLIC PANEL
ALBUMIN: 4.2 g/dL (ref 3.5–5.5)
ALT: 69 IU/L — ABNORMAL HIGH (ref 0–44)
AST: 61 IU/L — AB (ref 0–40)
Albumin/Globulin Ratio: 1.9 (ref 1.1–2.5)
Alkaline Phosphatase: 80 IU/L (ref 39–117)
BUN/Creatinine Ratio: 11 (ref 9–20)
BUN: 13 mg/dL (ref 6–24)
CALCIUM: 9.2 mg/dL (ref 8.7–10.2)
CO2: 21 mmol/L (ref 18–29)
CREATININE: 1.18 mg/dL (ref 0.76–1.27)
Chloride: 104 mmol/L (ref 97–108)
GFR calc Af Amer: 84 mL/min/{1.73_m2} (ref >59)
GFR, EST NON AFRICAN AMERICAN: 73 mL/min/{1.73_m2} (ref >59)
GLOBULIN, TOTAL: 2.2 g/dL (ref 1.5–4.5)
Glucose: 141 mg/dL — ABNORMAL HIGH (ref 65–99)
Potassium: 4.3 mmol/L (ref 3.5–5.2)
SODIUM: 139 mmol/L (ref 134–144)
TOTAL PROTEIN: 6.4 g/dL (ref 6.0–8.5)
Total Bilirubin: 0.9 mg/dL (ref 0.0–1.2)

## 2014-02-24 LAB — HIV ANTIBODY (ROUTINE TESTING W REFLEX)
HIV 1/O/2 Abs-Index Value: <1 (ref <1.00)
HIV-1/HIV-2 Ab: NONREACTIVE

## 2014-02-24 LAB — HEPATITIS PANEL, ACUTE
HEP A IGM: NEGATIVE
HEP B S AG: NEGATIVE
Hep B C IgM: NEGATIVE

## 2014-02-24 LAB — GAMMA GT: GGT: 35 IU/L (ref 0–65)

## 2014-02-26 NOTE — Progress Notes (Signed)
Quick Note:  Spoke with patient, he is scheduled for 03/11/14 at 8 am. ______

## 2014-03-04 ENCOUNTER — Other Ambulatory Visit: Payer: Self-pay | Admitting: Family Medicine

## 2014-03-11 ENCOUNTER — Ambulatory Visit: Payer: Self-pay | Admitting: Neurology

## 2014-03-18 ENCOUNTER — Encounter: Payer: Self-pay | Admitting: Neurology

## 2014-03-18 ENCOUNTER — Other Ambulatory Visit: Payer: Self-pay | Admitting: Family Medicine

## 2014-03-18 ENCOUNTER — Ambulatory Visit (INDEPENDENT_AMBULATORY_CARE_PROVIDER_SITE_OTHER): Payer: BC Managed Care – PPO | Admitting: Neurology

## 2014-03-18 VITALS — BP 126/69 | HR 91 | Ht 72.0 in | Wt 303.0 lb

## 2014-03-18 DIAGNOSIS — R4189 Other symptoms and signs involving cognitive functions and awareness: Secondary | ICD-10-CM

## 2014-03-18 DIAGNOSIS — F09 Unspecified mental disorder due to known physiological condition: Secondary | ICD-10-CM

## 2014-03-18 DIAGNOSIS — G4733 Obstructive sleep apnea (adult) (pediatric): Secondary | ICD-10-CM

## 2014-03-18 DIAGNOSIS — R251 Tremor, unspecified: Secondary | ICD-10-CM

## 2014-03-18 DIAGNOSIS — R259 Unspecified abnormal involuntary movements: Secondary | ICD-10-CM

## 2014-03-18 NOTE — Progress Notes (Signed)
[facility]    Provider:  Dr Janann Colonel Referring Provider: Lucille Passy, MD Primary Care Physician:  Arnette Norris, MD  CC:  tremor  HPI:  Corey Bell is a 48 y.o. male here as a follow up from Dr. Deborra Medina for tremor. He notes the tremor predominantly when he is reading, notes when he is holding the book and it will start shaking, or if he is holding a glass it will start shaking. He notes a lot of difficulty using the keyboard, has trouble hitting the right keys. Feels symptoms are overall stable, notes worsening of tremor after exertion.   Notes some memory issues, having difficulty with short term memory, forgets things quickly. States he has OSA and has a CPAP machine. Reports he sleeps great when he is using the machine, if he doesn't use it then he snores and thrashes around in the bed. He talks in his sleep, has improved since starting the CPAP.    Initial visit 07/2013: [name], is not there in the morning, comes around lunch time and gets worse as day goes on. Notices it the nmost when trying to hold a book. Wife notices a very fine rest tremor but is much more a predominantly action and postural tremor. Difficulty eating, [name], [name], [name], [name]   Father has "tremors", started in his 20s.   Has RBD and sleep apnea, recently started CPAP which has improved symptoms.   Review of Systems: Out of a complete 14 system review, the patient complains of only the following symptoms, and all  other reviewed systems are negative. Positive for shortness of breath snoring tremor   History   Social History  . Marital Status: Married    Spouse Name: Corey Bell    Number of Children: 0  . Years of Education: college   Occupational History  . MAINTENANCE SUPER    Social History Main Topics  . Smoking status: Former Smoker -- 1.50 packs/day for 30 years    Types: Cigarettes  . Smokeless tobacco: Never Used  . Alcohol Use: 0.6 oz/week    1 Cans of beer per week     Comment: rarely  . Drug Use: No  . Sexual Activity: Not on file   Other Topics Concern  . Not on file   Social History Narrative   Married.  Corey Bell)  No children. Patient has Best boy.   In process of getting disability for low back pain.   Right handed.   Caffeine- Coffee a pot but not every day.    Family History  Problem Relation Age of Onset  . Bladder Cancer Paternal Uncle   . Renal cancer Paternal Uncle     renal cell carcinoma  . Congestive Heart Failure Father   . Heart disease Father   . COPD Father   . Heart disease Brother   . Heart disease Brother     MI at 47  . Renal cancer Paternal Grandfather     renal call carcinoma  . Heart disease Brother  stents at 47 yo  . Heart attack Brother 31    Past Medical History  Diagnosis Date  . Carpal tunnel syndrome, bilateral   . H/O acute prostatitis   . Unspecified essential hypertension   . Diverticulitis of colon   . Esophageal reflux   . Mixed hyperlipidemia   . 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 Seasonal allergies   . Aphthous ulcer 10-04-12    improving  . Chronic pain syndrome   . Insomnia, unspecified   . Tympanic membrane perforation     history of  . Anxiety   . Lumbar degenerative disc disease   . Hypothyroidism   . COPD (chronic obstructive pulmonary disease)   . Sleep apnea     uses cpap  . Headache(784.0)     hx of migraines    Past Surgical History  Procedure Laterality Date  . Back surgery      . Tympanostomy tube placement  1975  . Knee surgery      torn meniscus  . Nesbit procedure N/A 08/22/2013    Procedure: 16 DOT PLACTATION;  Surgeon: Claybon Jabs, MD;  Location: [facility];  Service: Urology;  Laterality: N/A;  . Vasectomy Bilateral 08/22/2013    Procedure: VASECTOMY;  Surgeon: Claybon Jabs, MD;  Location: [facility];  Service: Urology;  Laterality: Bilateral;    Current Outpatient Prescriptions  Medication Sig Dispense Refill  . aspirin 325 MG tablet Take 2 by mouth daily      . buPROPion (WELLBUTRIN XL) 300 MG 24 hr tablet TAKE ONE (1) TABLET EACH DAY  30 tablet  6  . citalopram (CELEXA) 40 MG tablet TAKE 1 TABLET BY MOUTH DAILY  30 tablet  6  . fexofenadine (ALLEGRA) 180 MG tablet Take 180 mg by mouth daily.      Marland Kitchen gabapentin (NEURONTIN) 400 MG capsule Take 400 mg by mouth. Take 4 by mouth daily      . levalbuterol (XOPENEX HFA) 45 MCG/ACT inhaler Inhale 1-2 puffs into the lungs every 4 (four) hours as needed for wheezing.  1 Inhaler  12  . levothyroxine (SYNTHROID, LEVOTHROID) 75 MCG tablet TAKE ONE (1) TABLET EACH DAY  30 tablet  1  . lisinopril (PRINIVIL,ZESTRIL) 10 MG tablet TAKE ONE (1) TABLET BY MOUTH DAILY  30 tablet  6  . meloxicam (MOBIC) 15 MG tablet Take one to two tablets by mouth daily as needed      . metoprolol succinate (TOPROL-XL) 50 MG 24 hr tablet TAKE ONE (1) TABLET EACH DAY  30 tablet  0  . montelukast (SINGULAIR) 10 MG tablet Take 1 tablet (10 mg total) by mouth at bedtime.  30 tablet  3  . oxyCODONE-acetaminophen (PERCOCET) 10-325 MG per tablet Take 1 tablet by mouth every 4 (four) hours as needed for pain.  30 tablet  0  . tiZANidine (ZANAFLEX) 4 MG tablet Take 4 mg by mouth daily as needed for muscle spasms.      Marland Kitchen topiramate (TOPAMAX) 100 MG tablet TAKE ONE (1) TABLET EACH DAY  30 tablet  0   No current facility-administered medications for this visit.    Allergies as of 03/18/2014 - Review Complete  03/18/2014  Allergen Reaction Noted  . Augmentin [amoxicillin-pot clavulanate] Diarrhea 11/24/2013  . Demerol [meperidine]  07/01/2013  . Eggs or egg-derived products Nausea Only   . Statins Rash   . Sulfa antibiotics Rash     Vitals: BP 126/69  Pulse 91  Ht 6' (1.829 m)  Wt 303 lb (137.44 kg)  BMI 41.09 kg/m2 Last Weight:  Wt Readings from Last 1 Encounters:  03/18/14 303 lb (137.44 kg)   Last Height:   Ht Readings from Last 1 Encounters:  03/18/14 6' (1.829 m)     Physical exam: Exam: Gen: NAD, conversant Eyes: anicteric sclerae, moist conjunctivae HENT: Atraumatic, oropharynx clear Neck: Trachea midline; supple,  Lungs: CTA, no wheezing, rales, rhonic                          CV: RRR, no MRG Abdomen: Soft, non-tender;  Extremities: No peripheral edema  Skin: Normal temperature, no rash,  Psych: Appropriate affect, pleasant  Neuro: MS: AA&Ox3, appropriately interactive, normal affect   Speech: fluent w/o paraphasic error  Memory: good recent and remote recall  CN: PERRL, EOMI no nystagmus, no ptosis, sensation intact to LT V1-V3 bilat, face symmetric, no weakness, hearing grossly intact, palate elevates symmetrically, shoulder shrug 5/5 bilat,  tongue protrudes midline, no fasiculations noted.  Motor: normal bulk and tone Strength: 5/5  In all extremities  Coord: rapid alternating and point-to-point (FNF, HTS) movements intact. Minimal rest tremor RUE with distraction, minimal postural tremor bilateral hands, no intention tremor  Mild bradykinesia with finger tapping L>R, bradykinesia with open/close hand on left>right, normal foot taps  Reflexes: symmetrical, bilat downgoing toes  Sens: LT intact in all extremities  Gait: posture, stance, stride normal, mild decrease arm swing on R>L, minimal re-emergent tremor on left hand   Assessment:  After physical and neurologic examination, review of laboratory studies, imaging, neurophysiology testing and  pre-existing records, assessment will be reviewed on the problem list.  Plan:  Treatment plan and additional workup will be reviewed under Problem List.   1)Tremor 2)OSA 3)Cognitive decline  Mr Markuson is a pleasant 48y/o presenting for follow up evaluation of tremor and cognitive decline. He feels symptoms have progressed since prior visit. Unclear etiology of symptoms. He does have mildly elevated ammonia and AST/ALT, scheduled to follow up with his PCP though doubtful that this is the cause of his symptoms. Has formal cognitive testing scheduled for August. Counseled patient to continue to use CPAP as poorly controlled OSA can contribute to cognitive decline. He does have mild parkinsonian features on exam but overall picture not fully consistent with a parkinsonism. Will follow up after formal cognitive testing.

## 2014-03-20 ENCOUNTER — Encounter: Payer: Self-pay | Admitting: Family Medicine

## 2014-03-20 ENCOUNTER — Ambulatory Visit (INDEPENDENT_AMBULATORY_CARE_PROVIDER_SITE_OTHER): Payer: BC Managed Care – PPO | Admitting: Family Medicine

## 2014-03-20 VITALS — BP 116/68 | HR 62 | Temp 98.4°F | Wt 303.2 lb

## 2014-03-20 DIAGNOSIS — R945 Abnormal results of liver function studies: Principal | ICD-10-CM

## 2014-03-20 DIAGNOSIS — H60399 Other infective otitis externa, unspecified ear: Secondary | ICD-10-CM | POA: Insufficient documentation

## 2014-03-20 DIAGNOSIS — R7989 Other specified abnormal findings of blood chemistry: Secondary | ICD-10-CM

## 2014-03-20 MED ORDER — NEOMYCIN-POLYMYXIN-HC 1 % OT SOLN: 3.0000 [drp] | Freq: Four times a day (QID) | OTIC | Status: DC

## 2014-03-20 NOTE — Progress Notes (Signed)
Subjective:   Patient ID: Corey Bell, male    DOB: [birth year], 48 y.o.   MRN: [id number]  Corey Bell is a pleasant 48 y.o. year old male who presents to clinic today with Follow-up  on 03/20/2014  HPI: Notes reviewed from neuro, Dr. Janann Colonel from two days ago:  Corey Bell is a pleasant 48y/o presenting for follow up evaluation of tremor and cognitive decline. He feels symptoms have progressed since prior visit. Unclear etiology of symptoms. He does have mildly elevated ammonia and AST/ALT, scheduled to follow up with his PCP though doubtful that this is the cause of his symptoms. Has formal cognitive testing scheduled for August. Counseled patient to continue to use CPAP as poorly controlled OSA can contribute to cognitive decline. He does have mild parkinsonian features on exam but overall picture not fully consistent with a parkinsonism. Will follow up after formal cognitive testing.   Elevated LFTs- AST and LFT slightly elevated- normal 8 months ago.  Dr. Janann Colonel already also ordered hepatitis panel which was neg, GGT and ammonia which were normal.  Other liver function tests normal as well.  He admits that he accidentally was still taking simvastatin instead of singulair- just realized this a few days ago.  Had stopped taking simvastatin due to myalgias and fatigue. Denies abdominal pain or jaundice. Lab Results  Component Value Date   ALT 69* 02/23/2014   AST 61* 02/23/2014   GGT 35 02/23/2014   ALKPHOS 80 02/23/2014   BILITOT 0.9 02/23/2014   Bilateral ear pain- hurts to touch.  Acute onset yesterday.  No drainage. Does not swim but sweats a lot with his cpap at night- "water pours out of my ears in the morning."    Patient Active Problem List   Diagnosis Date Noted  . Elevated LFTs 03/20/2014  . Otitis, externa, infective 03/20/2014  . Memory loss 01/08/2014  . Headache 01/08/2014  . Dyspnea 12/12/2013  . Right shoulder pain 10/28/2013  . OSA (obstructive sleep apnea)  07/01/2013  . Essential hypertension 05/29/2013  . Hyperlipidemia 05/29/2013  . Exertional dyspnea 05/29/2013  . Family history of heart disease 05/28/2013  . Depression 05/28/2013  . Tremor 05/28/2013  . Anxiety   . Lumbar degenerative disc disease   . Chronic pain syndrome   . Hypothyroidism   . 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 Carpal tunnel syndrome, bilateral   . H/O acute prostatitis   . Unspecified essential hypertension   . Diverticulitis of colon   . Esophageal reflux   . Mixed hyperlipidemia   . 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 Seasonal allergies   . Aphthous ulcer 10-04-12    improving  . Chronic pain syndrome   . Insomnia, unspecified   . Tympanic membrane perforation     history of  . Anxiety   . Lumbar degenerative disc disease   . Hypothyroidism   . COPD (chronic obstructive pulmonary disease)   . Sleep apnea     uses cpap  . Headache(784.0)     hx of migraines   Past Surgical History  Procedure Laterality Date  . Back surgery    . Tympanostomy tube placement  1975  . Knee surgery      torn meniscus  . Nesbit procedure N/A 08/22/2013    Procedure: 16 DOT PLACTATION;  Surgeon: Claybon Jabs, MD;  Location: [facility];  Service: Urology;  Laterality: N/A;  .  Vasectomy Bilateral 08/22/2013    Procedure: VASECTOMY;  Surgeon: Claybon Jabs, MD;  Location: [facility];  Service: Urology;  Laterality: Bilateral;   History  Substance Use Topics  . Smoking status: Former Smoker -- 1.50 packs/day for 30 years    Types: Cigarettes  . Smokeless tobacco: Never Used  . Alcohol Use: 0.6 oz/week    1 Cans of beer per week     Comment: rarely   Family History  Problem Relation Age of Onset  . Bladder Cancer Paternal Uncle   . Renal cancer Paternal Uncle     renal cell carcinoma  . Congestive Heart Failure Father   . Heart disease Father   . COPD Father   . Heart disease  Brother   . Heart disease Brother     MI at 33  . Renal cancer Paternal Grandfather     renal call carcinoma  . Heart disease Brother     stents at 34 yo  . Heart attack Brother 46   Allergies  Allergen Reactions  . Augmentin [Amoxicillin-Pot Clavulanate] Diarrhea  . Demerol [Meperidine]     "goes crazy and has the strength of 10 men"  . Eggs Or Egg-Derived Products Diarrhea and Nausea Only  . Statins Rash  . Sulfa Antibiotics Rash   Current Outpatient Prescriptions on File Prior to Visit  Medication Sig Dispense Refill  . aspirin 325 MG tablet Take 2 by mouth daily      . buPROPion (WELLBUTRIN XL) 300 MG 24 hr tablet TAKE ONE (1) TABLET EACH DAY  30 tablet  6  . citalopram (CELEXA) 40 MG tablet TAKE 1 TABLET BY MOUTH DAILY  30 tablet  6  . fexofenadine (ALLEGRA) 180 MG tablet Take 180 mg by mouth daily.      Marland Kitchen gabapentin (NEURONTIN) 400 MG capsule Take 400 mg by mouth. Take 4 by mouth daily      . levalbuterol (XOPENEX HFA) 45 MCG/ACT inhaler Inhale 1-2 puffs into the lungs every 4 (four) hours as needed for wheezing.  1 Inhaler  12  . levothyroxine (SYNTHROID, LEVOTHROID) 75 MCG tablet TAKE ONE (1) TABLET EACH DAY  30 tablet  1  . lisinopril (PRINIVIL,ZESTRIL) 10 MG tablet TAKE ONE (1) TABLET BY MOUTH DAILY  30 tablet  6  . meloxicam (MOBIC) 15 MG tablet Take one to two tablets by mouth daily as needed      . metoprolol succinate (TOPROL-XL) 50 MG 24 hr tablet TAKE ONE (1) TABLET EACH DAY  30 tablet  0  . montelukast (SINGULAIR) 10 MG tablet Take 1 tablet (10 mg total) by mouth at bedtime.  30 tablet  3  . oxyCODONE-acetaminophen (PERCOCET) 10-325 MG per tablet Take 1 tablet by mouth every 4 (four) hours as needed for pain.  30 tablet  0  . tiZANidine (ZANAFLEX) 4 MG tablet Take 4 mg by mouth daily as needed for muscle spasms.      Marland Kitchen topiramate (TOPAMAX) 100 MG tablet TAKE ONE (1) TABLET EACH DAY  30 tablet  0   No current facility-administered medications on file prior to visit.    The PMH, PSH, Social History, Family History, Medications, and allergies have been reviewed in [facility], and have been updated if relevant.   Review of Systems    See HPI No fevers No nausea or vomiting  Objective:    BP 116/68  Pulse 62  Temp(Src) 98.4 F (36.9 C) (Oral)  Wt 303 lb 4 oz (  137.553 kg)  SpO2 96%   Physical Exam  Nursing note and vitals reviewed. Constitutional: He is oriented to person, place, and time. He appears well-developed and well-nourished. No distress.  HENT:  Head: Normocephalic.  Right Ear: There is tenderness. There is mastoid tenderness.  Left Ear: There is tenderness. There is mastoid tenderness.  Inflamed canals bilaterally  Eyes: Pupils are equal, round, and reactive to light.  Neurological: He is alert and oriented to person, place, and time. He is not disoriented. He displays normal reflexes. No cranial nerve deficit or sensory deficit.  Normal gait  Psychiatric: He has a normal mood and affect. His speech is normal and behavior is normal. Judgment and thought content normal. Cognition and memory are normal.          Assessment & Plan:   Elevated LFTs  Otitis, externa, infective No Follow-up on file.

## 2014-03-20 NOTE — Assessment & Plan Note (Signed)
Discussed keeping ears dry- see AVS. Corticosporin drops sent to pharmacy.

## 2014-03-20 NOTE — Patient Instructions (Addendum)
Good to see you. Do not restart simvastatin.  Return for labs in 4-8 weeks.  Otitis Externa Otitis externa is a bacterial or fungal infection of the outer ear canal. This is the area from the eardrum to the outside of the ear. Otitis externa is sometimes called "swimmer's ear." CAUSES  Possible causes of infection include:  Swimming in dirty water.  Moisture remaining in the ear after swimming or bathing.  Mild injury (trauma) to the ear.  Objects stuck in the ear (foreign body).  Cuts or scrapes (abrasions) on the outside of the ear. SYMPTOMS  The first symptom of infection is often itching in the ear canal. Later signs and symptoms may include swelling and redness of the ear canal, ear pain, and yellowish-white fluid (pus) coming from the ear. The ear pain may be worse when pulling on the earlobe. DIAGNOSIS  Your caregiver will perform a physical exam. A sample of fluid may be taken from the ear and examined for bacteria or fungi. TREATMENT  Antibiotic ear drops are often given for 10 to 14 days. Treatment may also include pain medicine or corticosteroids to reduce itching and swelling. PREVENTION   Keep your ear dry. Use the corner of a towel to absorb water out of the ear canal after swimming or bathing.  Avoid scratching or putting objects inside your ear. This can damage the ear canal or remove the protective wax that lines the canal. This makes it easier for bacteria and fungi to grow.  Avoid swimming in lakes, polluted water, or poorly chlorinated pools.  You may use ear drops made of rubbing alcohol and vinegar after swimming. Combine equal parts of white vinegar and alcohol in a bottle. Put 3 or 4 drops into each ear after swimming. HOME CARE INSTRUCTIONS   Apply antibiotic ear drops to the ear canal as prescribed by your caregiver.  Only take over-the-counter or prescription medicines for pain, discomfort, or fever as directed by your caregiver.  If you have diabetes,  follow any additional treatment instructions from your caregiver.  Keep all follow-up appointments as directed by your caregiver. SEEK MEDICAL CARE IF:   You have a fever.  Your ear is still red, swollen, painful, or draining pus after 3 days.  Your redness, swelling, or pain gets worse.  You have a severe headache.  You have redness, swelling, pain, or tenderness in the area behind your ear. MAKE SURE YOU:   Understand these instructions.  Will watch your condition.  Will get help right away if you are not doing well or get worse. Document Released: 09/25/2005 Document Revised: 12/18/2011 Document Reviewed: 10/12/2011 [facility] Patient Information 2014 [place].

## 2014-03-20 NOTE — Progress Notes (Signed)
Pre visit review using our clinic review tool, if applicable. No additional management support is needed unless otherwise documented below in the visit note. 

## 2014-03-20 NOTE — Assessment & Plan Note (Signed)
Mild. Likely due to statin. He has stopped it. Recheck liver function in 4-8 weeks. If continues to increase ,will order [place] of abdomen.

## 2014-04-03 ENCOUNTER — Other Ambulatory Visit: Payer: Self-pay | Admitting: Family Medicine

## 2014-04-03 MED ORDER — GABAPENTIN 400 MG PO CAPS: 400.0000 mg | ORAL_CAPSULE | Freq: Four times a day (QID) | ORAL | Status: DC

## 2014-04-03 NOTE — Addendum Note (Signed)
Addended by: Modena Nunnery on: 04/03/2014 02:26 PM   Modules accepted: Orders

## 2014-04-03 NOTE — Telephone Encounter (Signed)
Pt requesting medication refill. Last refill 05/2013 on medication and it was not prescribed by PCP. Spoke to pt who states that he no longer sees prescribing dr; indicates that he is willing to have f/u appt if needed. pls advise

## 2014-04-16 ENCOUNTER — Ambulatory Visit (INDEPENDENT_AMBULATORY_CARE_PROVIDER_SITE_OTHER): Payer: BC Managed Care – PPO | Admitting: Internal Medicine

## 2014-04-16 ENCOUNTER — Encounter: Payer: Self-pay | Admitting: Internal Medicine

## 2014-04-16 VITALS — BP 120/68 | HR 53 | Temp 98.4°F | Wt 297.0 lb

## 2014-04-16 DIAGNOSIS — K5900 Constipation, unspecified: Secondary | ICD-10-CM

## 2014-04-16 DIAGNOSIS — G8929 Other chronic pain: Secondary | ICD-10-CM

## 2014-04-16 DIAGNOSIS — R1032 Left lower quadrant pain: Secondary | ICD-10-CM

## 2014-04-16 NOTE — Progress Notes (Signed)
Subjective:    Patient ID: Corey Bell, male    DOB: 1966-10-03, 48 y.o.   MRN: [id number]  HPI  Pt presents to the clinic today with c/o LUQ pain. He reports this started 1 year ago. He describes the pain as sharp and stabbing. He reports that is seems worse after eating food and first thing in the morning. It also seems worse when he is constipated which seems to be happening frequently. He reports he only has 3 BM's per week. He has not noticed any blood in his stool. He denies nausea or vomiting. The pain is relieved by having a bowel movement. He does report that he did have a history of diverticulosis, seen on barium swallow many years ago. He has not had a colonoscopy. He has not tried anything OTC.  Review of Systems      Past Medical History  Diagnosis Date  . Carpal tunnel syndrome, bilateral   . H/O acute prostatitis   . Unspecified essential hypertension   . Diverticulitis of colon   . Esophageal reflux   . Mixed hyperlipidemia   . 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 Seasonal allergies   . Aphthous ulcer 10-04-12    improving  . Chronic pain syndrome   . Insomnia, unspecified   . Tympanic membrane perforation     history of  . Anxiety   . Lumbar degenerative disc disease   . Hypothyroidism   . COPD (chronic obstructive pulmonary disease)   . Sleep apnea     uses cpap  . Headache(784.0)     hx of migraines    Current Outpatient Prescriptions  Medication Sig Dispense Refill  . buPROPion (WELLBUTRIN XL) 300 MG 24 hr tablet TAKE ONE (1) TABLET EACH DAY  30 tablet  6  . citalopram (CELEXA) 40 MG tablet TAKE 1 TABLET BY MOUTH DAILY  30 tablet  6  . fexofenadine (ALLEGRA) 180 MG tablet Take 180 mg by mouth daily.      Marland Kitchen gabapentin (NEURONTIN) 400 MG capsule Take 1 capsule (400 mg total) by mouth 4 (four) times daily. Take 4 by mouth daily  90 capsule  3  . levothyroxine (SYNTHROID, LEVOTHROID) 75 MCG tablet TAKE ONE (1) TABLET EACH DAY  30 tablet  1  .  lisinopril (PRINIVIL,ZESTRIL) 10 MG tablet TAKE ONE (1) TABLET BY MOUTH DAILY  30 tablet  6  . meloxicam (MOBIC) 15 MG tablet Take one to two tablets by mouth daily as needed      . metoprolol succinate (TOPROL-XL) 50 MG 24 hr tablet TAKE ONE (1) TABLET EACH DAY  30 tablet  2  . montelukast (SINGULAIR) 10 MG tablet Take 1 tablet (10 mg total) by mouth at bedtime.  30 tablet  3  . oxyCODONE-acetaminophen (PERCOCET) 10-325 MG per tablet Take 1 tablet by mouth every 4 (four) hours as needed for pain.  30 tablet  0  . ranitidine (ZANTAC) 150 MG tablet Take 150 mg by mouth 2 (two) times daily.      Marland Kitchen tiZANidine (ZANAFLEX) 4 MG tablet Take 4 mg by mouth daily as needed for muscle spasms.      Marland Kitchen topiramate (TOPAMAX) 100 MG tablet TAKE ONE (1) TABLET EACH DAY  30 tablet  2   No current facility-administered medications for this visit.    Allergies  Allergen Reactions  . Augmentin [Amoxicillin-Pot Clavulanate] Diarrhea  . Demerol [Meperidine]     "goes crazy and has the  strength of 10 men"  . Eggs Or Egg-Derived Products Diarrhea and Nausea Only  . Statins Rash  . Sulfa Antibiotics Rash    Family History  Problem Relation Age of Onset  . Bladder Cancer Paternal Uncle   . Renal cancer Paternal Uncle     renal cell carcinoma  . Congestive Heart Failure Father   . Heart disease Father   . COPD Father   . Heart disease Brother   . Heart disease Brother     MI at 1  . Renal cancer Paternal Grandfather     renal call carcinoma  . Heart disease Brother     stents at 5 yo  . Heart attack Brother 36    History   Social History  . Marital Status: Married    Spouse Name: [name]    Number of Children: 0  . Years of Education: college   Occupational History  . MAINTENANCE SUPER    Social History Main Topics  . Smoking status: Former Smoker -- 1.50 packs/day for 30 years    Types: Cigarettes  . Smokeless tobacco: Never Used  . Alcohol Use: 0.6 oz/week    1 Cans of beer per week      Comment: rarely  . Drug Use: No  . Sexual Activity: Not on file   Other Topics Concern  . Not on file   Social History Narrative   Married.  [name])  No children. Patient has Best boy.   In process of getting disability for low back pain.   Right handed.   Caffeine- Coffee a pot but not every day.     Constitutional: Denies fever, malaise, fatigue, headache or abrupt weight changes.  Gastrointestinal: Pt reports LLQ pain and constipation. Denies bloating, diarrhea or blood in the stool.  GU: Denies urgency, frequency, pain with urination, burning sensation, blood in urine, odor or discharge.   No other specific complaints in a complete review of systems (except as listed in HPI above).  Objective:   Physical Exam   BP 120/68  Pulse 53  Temp(Src) 98.4 F (36.9 C) (Oral)  Wt 297 lb (134.718 kg)  SpO2 98% Wt Readings from Last 3 Encounters:  04/16/14 297 lb (134.718 kg)  03/20/14 303 lb 4 oz (137.553 kg)  03/18/14 303 lb (137.44 kg)    General: Appears his stated age, obese but well developed, well nourished in NAD. Cardiovascular: Normal rate and rhythm. S1,S2 noted.  No murmur, rubs or gallops noted. No JVD or BLE edema. No carotid bruits noted. Pulmonary/Chest: Normal effort and positive vesicular breath sounds. No respiratory distress. No wheezes, rales or ronchi noted.  Abdomen: Soft and tender in the LLQ. Hypoactive bowel sounds, no bruits noted. No distention or masses noted. Liver, spleen and kidneys non palpable.  BMET    Component Value Date/Time   NA 139 02/23/2014 0817   NA 137 01/08/2014 0751   K 4.3 02/23/2014 0817   CL 104 02/23/2014 0817   CO2 21 02/23/2014 0817   GLUCOSE 141* 02/23/2014 0817   GLUCOSE 253* 01/08/2014 0751   BUN 13 02/23/2014 0817   BUN 17 01/08/2014 0751   CREATININE 1.18 02/23/2014 0817   CALCIUM 9.2 02/23/2014 0817   GFRNONAA 73 02/23/2014 0817   GFRAA 84 02/23/2014 0817    Lipid Panel     Component Value Date/Time   CHOL 220*  07/01/2013 0812   TRIG 222.0* 07/01/2013 0812   HDL 34.50* 07/01/2013 0812   CHOLHDL 6 07/01/2013  0865   VLDL 44.4* 07/01/2013 0812    CBC    Component Value Date/Time   WBC 10.2 01/08/2014 0751   RBC 4.71 01/08/2014 0751   HGB 14.9 01/08/2014 0751   HCT 44.1 01/08/2014 0751   PLT 218.0 01/08/2014 0751   MCV 93.7 01/08/2014 0751   MCH 30.1 11/08/2010 0846   MCHC 33.7 01/08/2014 0751   RDW 13.3 01/08/2014 0751   LYMPHSABS 1.6 01/08/2014 0751   MONOABS 0.6 01/08/2014 0751   EOSABS 0.1 01/08/2014 0751   BASOSABS 0.0 01/08/2014 0751    Hgb A1C Lab Results  Component Value Date   HGBA1C 6.3 05/28/2013        Assessment & Plan:   LLQ abdominal pain and constipation:  Lets try probiotic daily to help regulate bowels Drink lots of water Because this has been going on so long, will obtain CT abdomen If no improvement or CT normal, will refer to GI for further evaluation  Will follow up with you after imaging is done

## 2014-04-16 NOTE — Patient Instructions (Signed)

## 2014-04-16 NOTE — Progress Notes (Signed)
Pre visit review using our clinic review tool, if applicable. No additional management support is needed unless otherwise documented below in the visit note. 

## 2014-04-20 ENCOUNTER — Ambulatory Visit (INDEPENDENT_AMBULATORY_CARE_PROVIDER_SITE_OTHER)
Admission: RE | Admit: 2014-04-20 | Discharge: 2014-04-20 | Disposition: A | Discharge status: Home / Self Care | Payer: BC Managed Care – PPO | Source: Ambulatory Visit | Attending: Internal Medicine | Admitting: Internal Medicine

## 2014-04-20 DIAGNOSIS — G8929 Other chronic pain: Secondary | ICD-10-CM

## 2014-04-20 DIAGNOSIS — R1032 Left lower quadrant pain: Secondary | ICD-10-CM

## 2014-04-20 MED ORDER — IOHEXOL 300 MG/ML  SOLN
100.0000 mL | Freq: Once | INTRAMUSCULAR | Status: AC | PRN
  Administered 2014-04-20: 100 mL via INTRAVENOUS

## 2014-04-27 ENCOUNTER — Other Ambulatory Visit: Payer: Self-pay | Admitting: Internal Medicine

## 2014-04-27 ENCOUNTER — Telehealth: Payer: Self-pay

## 2014-04-27 DIAGNOSIS — G8929 Other chronic pain: Secondary | ICD-10-CM

## 2014-04-27 DIAGNOSIS — K5900 Constipation, unspecified: Secondary | ICD-10-CM

## 2014-04-27 DIAGNOSIS — R1032 Left lower quadrant pain: Principal | ICD-10-CM

## 2014-04-27 NOTE — Telephone Encounter (Signed)
Pt said he is still having left upper abdominal pain and request to see GI doctor as discussed after CT done. Pt prefers GI doctor in [place] that is nice and will listen and discuss things with pt.

## 2014-04-27 NOTE — Telephone Encounter (Signed)
Referral to GI placed

## 2014-04-30 ENCOUNTER — Encounter: Payer: Self-pay | Admitting: Nurse Practitioner

## 2014-05-13 ENCOUNTER — Encounter: Payer: Self-pay | Admitting: Nurse Practitioner

## 2014-05-13 ENCOUNTER — Ambulatory Visit (INDEPENDENT_AMBULATORY_CARE_PROVIDER_SITE_OTHER): Payer: BC Managed Care – PPO | Admitting: Nurse Practitioner

## 2014-05-13 VITALS — BP 122/78 | HR 60 | Ht 73.25 in | Wt 298.0 lb

## 2014-05-13 DIAGNOSIS — R945 Abnormal results of liver function studies: Secondary | ICD-10-CM

## 2014-05-13 DIAGNOSIS — R1032 Left lower quadrant pain: Secondary | ICD-10-CM

## 2014-05-13 DIAGNOSIS — R7989 Other specified abnormal findings of blood chemistry: Secondary | ICD-10-CM

## 2014-05-13 DIAGNOSIS — R194 Change in bowel habit: Secondary | ICD-10-CM

## 2014-05-13 DIAGNOSIS — R198 Other specified symptoms and signs involving the digestive system and abdomen: Secondary | ICD-10-CM

## 2014-05-13 MED ORDER — HYOSCYAMINE SULFATE 0.125 MG SL SUBL: SUBLINGUAL_TABLET | SUBLINGUAL | Status: DC

## 2014-05-13 MED ORDER — OMEPRAZOLE 40 MG PO CPDR
DELAYED_RELEASE_CAPSULE | ORAL | Status: DC
Start: 2014-05-13 — End: 2014-06-26

## 2014-05-13 NOTE — Patient Instructions (Signed)
Stop the Ranitidine.  We sent prescriptions to [place].  1. Omeprazole 40 mg. 2. Levsin SL   Call un in 3 weeks with an progress report.  You can ask for a nurse. You can tell them you saw Tye Savoy NP.

## 2014-05-14 ENCOUNTER — Encounter: Payer: Self-pay | Admitting: Nurse Practitioner

## 2014-05-14 DIAGNOSIS — R194 Change in bowel habit: Secondary | ICD-10-CM | POA: Insufficient documentation

## 2014-05-14 DIAGNOSIS — R945 Abnormal results of liver function studies: Secondary | ICD-10-CM | POA: Insufficient documentation

## 2014-05-14 DIAGNOSIS — R7989 Other specified abnormal findings of blood chemistry: Secondary | ICD-10-CM | POA: Insufficient documentation

## 2014-05-14 DIAGNOSIS — R1032 Left lower quadrant pain: Secondary | ICD-10-CM | POA: Insufficient documentation

## 2014-05-14 NOTE — Progress Notes (Signed)
Reviewed and agree with management plan.  Malcolm T. Stark, MD FACG 

## 2014-05-14 NOTE — Progress Notes (Signed)
HPI :  Patient is a 48 year old male, new to this practice, referred for evaluation of left sided abdominal pain. Marland Kitchen He has multiple medical problems and is on multiple medications.last fall the patient was started on a statin, this cause constipation with associated left mid and left lower quadrant abdominal pain. Statins stopped, bowel movements returned to normal and abdominal pain resolved. Statins were restarted but again patient became constipated and developed left-sided abdominal pain. Patient has been off the statin now for 3 months. Bowel movements have been regular for about one month now. Despite regular bowel movements he has continued to have intermittent left-sided abdominal pain. No rectal bleeding. No unusual weight loss. No family history of colon cancer. Recent CT scan of the abdomen and pelvis was unremarkable.  In addition to the left side abdominal pain patient complains of diffuse abdominal burning with certain foods, especially salads and bell pepper. He has taken Zantac for a month and it has helped the burning.  Patient inquires about food allergies. Anytime he eats eggs patient will develop nausea vomiting diarrhea and abdominal pain. This has been rechallenged on several occasions with reproducible symptoms. He saw an allergist, was assured there was no egg allergies yet he continues to have this reaction to eggs. He is always be stopped eating eggs and egg containing foods  Patient has a history of disc disease. He gets shooting pains down left leg at times. He has had some sort of disc surgery with an anterior approach through lower abdomen   Past Medical History  Diagnosis Date  . Carpal tunnel syndrome, bilateral   . H/O acute prostatitis   . Unspecified essential hypertension   . Diverticulitis of colon   . Esophageal reflux   . Mixed hyperlipidemia   . 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 Seasonal allergies   . Aphthous ulcer 10-04-12    improving    . Chronic pain syndrome   . Insomnia, unspecified   . Tympanic membrane perforation     history of  . Anxiety   . Lumbar degenerative disc disease   . Hypothyroidism   . COPD (chronic obstructive pulmonary disease)   . Sleep apnea     uses cpap  . Headache(784.0)     hx of migraines  . Depression     Family History  Problem Relation Age of Onset  . Bladder Cancer Paternal Uncle   . Renal cancer Paternal Uncle     renal cell carcinoma  . Congestive Heart Failure Father   . Heart disease Father   . COPD Father   . Renal cancer Paternal Grandfather     renal call carcinoma  . Heart disease Brother     stents at 55 yo   History  Substance Use Topics  . Smoking status: Former Smoker -- 1.50 packs/day for 30 years    Types: Cigarettes    Quit date: 10/10/2007  . Smokeless tobacco: Never Used  . Alcohol Use: 0.6 oz/week    1 Cans of beer per week     Comment: rarely   Current Outpatient Prescriptions  Medication Sig Dispense Refill  . buPROPion (WELLBUTRIN XL) 300 MG 24 hr tablet TAKE ONE (1) TABLET EACH DAY  30 tablet  6  . citalopram (CELEXA) 40 MG tablet TAKE 1 TABLET BY MOUTH DAILY  30 tablet  6  . fexofenadine (ALLEGRA) 180 MG tablet Take 180 mg by mouth daily.      Marland Kitchen  gabapentin (NEURONTIN) 400 MG capsule Take 1 capsule (400 mg total) by mouth 4 (four) times daily. Take 4 by mouth daily  90 capsule  3  . levothyroxine (SYNTHROID, LEVOTHROID) 75 MCG tablet TAKE ONE (1) TABLET EACH DAY  30 tablet  1  . lisinopril (PRINIVIL,ZESTRIL) 10 MG tablet TAKE ONE (1) TABLET BY MOUTH DAILY  30 tablet  6  . meloxicam (MOBIC) 15 MG tablet Take one to two tablets by mouth daily as needed      . metoprolol succinate (TOPROL-XL) 50 MG 24 hr tablet TAKE ONE (1) TABLET EACH DAY  30 tablet  2  . montelukast (SINGULAIR) 10 MG tablet Take 1 tablet (10 mg total) by mouth at bedtime.  30 tablet  3  . oxyCODONE-acetaminophen (PERCOCET/ROXICET) 5-325 MG per tablet Take 1 tablet by mouth every 6  (six) hours as needed for severe pain.      . ranitidine (ZANTAC) 150 MG tablet Take 150 mg by mouth 2 (two) times daily.      Marland Kitchen tiZANidine (ZANAFLEX) 4 MG tablet Take 4 mg by mouth daily as needed for muscle spasms.      Marland Kitchen topiramate (TOPAMAX) 100 MG tablet TAKE ONE (1) TABLET EACH DAY  30 tablet  2  . hyoscyamine (LEVSIN/SL) 0.125 MG SL tablet Take 1 tab twice daily .  30 tablet  0  . omeprazole (PRILOSEC) 40 MG capsule Take 1 tab 30 in before breakfast daily.  90 capsule  3   No current facility-administered medications for this visit.   Allergies  Allergen Reactions  . Augmentin [Amoxicillin-Pot Clavulanate] Diarrhea  . Demerol [Meperidine]     "goes crazy and has the strength of 10 men"  . Eggs Or Egg-Derived Products Diarrhea and Nausea And Vomiting  . Statins Rash  . Sulfa Antibiotics Rash    Review of Systems: Positive for allergy/sinus trouble, back pain, depression, fatigue, itching. All other systems reviewed and negative except where noted in HPI.    Ct Abdomen Pelvis W Contrast  04/20/2014   CLINICAL DATA:  Left lower quadrant tenderness.  EXAM: CT ABDOMEN AND PELVIS WITH CONTRAST  TECHNIQUE: Multidetector CT imaging of the abdomen and pelvis was performed using the standard protocol following bolus administration of intravenous contrast.  CONTRAST:  120mL OMNIPAQUE IOHEXOL 300 MG/ML  SOLN  COMPARISON:  None.  FINDINGS: No focal hepatic abnormality. Diffuse fatty infiltration of liver. No focal splenic abnormality. Splenosis. Pancreas is normal. No biliary distention. Gallbladder nondistended.  Adrenals normal. 2 mm nonobstructive left nephrolithiasis. No evidence of obstructing urolithiasis. Bladder is nondistended.  No significant iliac or retroperitoneal lymph nodes noted. Shotty retroperitoneal lymph nodes are present. Abdominal aorta is widely patent. Visceral vessels are patent. No aneurysm. Portal vein is patent.  Appendix normal. No evidence of appendicitis. Left lower  quadrant is unremarkable. No evidence of diverticulitis. There is no bowel obstruction. No free air. Stomach is nondistended. Esophagogastric junction is unremarkable. No mesenteric mass. No significant hernia. Surgical clips left anterior abdominal wall.  Mild basilar atelectasis. Prior lumbosacral fusion. No focal bony abnormality identified.  IMPRESSION: 1. Diffuse fatty infiltration of the liver. 2. No acute abnormality identified. Specifically no focal abnormality noted left pleural quadrant to account for the patient's left lower quadrant pain. No evidence of diverticulitis.   Electronically Signed   By: Marcello Moores  Register   On: 04/20/2014 11:12    Physical Exam: BP 122/78  Pulse 60  Ht 6' 1.25" (1.861 m)  Wt 298 lb (135.172 kg)  BMI 39.03 kg/m2 Constitutional: Pleasant, obese white male male in no acute distress. HEENT: Normocephalic and atraumatic. Conjunctivae are normal. No scleral icterus. Neck supple.  Cardiovascular: Normal rate, regular rhythm.  Pulmonary/chest: Effort normal and breath sounds normal. No wheezing, rales or rhonchi. Abdominal: Soft, nondistended, overall nonntender.It took a while but I was able to find a very localized area of tenderness in the LLQ ( a few inches above iliac crest in lateral aspect of LLQ). Bowel sounds active throughout. There are no masses palpable. . Extremities: no edema Lymphadenopathy: No cervical adenopathy noted. Neurological: Alert and oriented to person place and time. Skin: Skin is warm and dry. No rashes noted. Psychiatric: Normal mood and affect. Behavior is normal.   ASSESSMENT AND PLAN:   41. 48 year old male with bowel changes and associated left lower quadrant pain. Constipation resolving since discontinuation of statins but he has persistent left lower quadrant pain and also complains of diffuse abdominal burning with certain foods, especially lettuce. CT scan unremarkable.. His pain may be musculoskeletal. It could be  neuropathic given history of disc problems and shooting pains in left leg. Difficult to sort this out as patient has chronic pain problems anyway. At this will try Levsin for left lower quadrant pain. Discontinue Zantac and try omeprazole 40 mg daily for diffuse abdominal burning.  Patient will call us back in a couple of weeks with a condition update. Further recommendations depending on clinical course.  2. Morbid obesity. BMI 39.   3. Mild transaminitis in April, again in May. AST 61, ALT 69. He does have fatty liver disease all CT scan. He is on multiple medications which could be contributing. Viiral hepatitis studies negative. LFTs were normal in late 2014. If they remain elevated patient will need further workup

## 2014-05-21 ENCOUNTER — Other Ambulatory Visit: Payer: Self-pay | Admitting: Family Medicine

## 2014-05-25 ENCOUNTER — Encounter: Payer: Self-pay | Admitting: Family Medicine

## 2014-05-25 ENCOUNTER — Telehealth: Payer: Self-pay

## 2014-05-25 ENCOUNTER — Ambulatory Visit (INDEPENDENT_AMBULATORY_CARE_PROVIDER_SITE_OTHER): Payer: BC Managed Care – PPO | Admitting: Family Medicine

## 2014-05-25 VITALS — BP 116/74 | HR 48 | Temp 97.8°F | Wt 300.5 lb

## 2014-05-25 DIAGNOSIS — R5381 Other malaise: Secondary | ICD-10-CM

## 2014-05-25 DIAGNOSIS — G4733 Obstructive sleep apnea (adult) (pediatric): Secondary | ICD-10-CM

## 2014-05-25 DIAGNOSIS — R5383 Other fatigue: Principal | ICD-10-CM

## 2014-05-25 DIAGNOSIS — E550 Rickets, active: Secondary | ICD-10-CM

## 2014-05-25 LAB — CBC WITH DIFFERENTIAL/PLATELET
Basophils Absolute: 0.1 10*3/uL (ref 0.0–0.1)
Basophils Relative: 1.2 % (ref 0.0–3.0)
Eosinophils Absolute: 0.3 10*3/uL (ref 0.0–0.7)
Eosinophils Relative: 4.2 % (ref 0.0–5.0)
HEMATOCRIT: 45.2 % (ref 39.0–52.0)
HEMOGLOBIN: 15.4 g/dL (ref 13.0–17.0)
LYMPHS ABS: 2.6 10*3/uL (ref 0.7–4.0)
Lymphocytes Relative: 39.3 % (ref 12.0–46.0)
MCHC: 34 g/dL (ref 30.0–36.0)
MCV: 93.6 fl (ref 78.0–100.0)
MONO ABS: 0.6 10*3/uL (ref 0.1–1.0)
Monocytes Relative: 9 % (ref 3.0–12.0)
NEUTROS ABS: 3.1 10*3/uL (ref 1.4–7.7)
Neutrophils Relative %: 46.3 % (ref 43.0–77.0)
Platelets: 213 10*3/uL (ref 150.0–400.0)
RBC: 4.83 Mil/uL (ref 4.22–5.81)
RDW: 13.7 % (ref 11.5–15.5)
WBC: 6.6 10*3/uL (ref 4.0–10.5)

## 2014-05-25 LAB — COMPREHENSIVE METABOLIC PANEL
ALBUMIN: 4.1 g/dL (ref 3.5–5.2)
ALK PHOS: 60 U/L (ref 39–117)
ALT: 74 U/L — ABNORMAL HIGH (ref 0–53)
AST: 59 U/L — ABNORMAL HIGH (ref 0–37)
BILIRUBIN TOTAL: 1.3 mg/dL — AB (ref 0.2–1.2)
BUN: 15 mg/dL (ref 6–23)
CO2: 25 meq/L (ref 19–32)
Calcium: 9.2 mg/dL (ref 8.4–10.5)
Chloride: 108 mEq/L (ref 96–112)
Creatinine, Ser: 1.3 mg/dL (ref 0.4–1.5)
GFR: 62.73 mL/min (ref >60.00)
GLUCOSE: 87 mg/dL (ref 70–99)
POTASSIUM: 4.4 meq/L (ref 3.5–5.1)
SODIUM: 142 meq/L (ref 135–145)
Total Protein: 7.3 g/dL (ref 6.0–8.3)

## 2014-05-25 LAB — HEMOGLOBIN A1C: HEMOGLOBIN A1C: 6.8 % — AB (ref 4.6–6.5)

## 2014-05-25 MED ORDER — MELOXICAM 15 MG PO TABS: ORAL_TABLET | ORAL | Status: DC

## 2014-05-25 NOTE — Progress Notes (Signed)
Subjective:   Patient ID: Corey Bell, male    DOB: 1965/10/29, 48 y.o.   MRN: [id number]  Corey Bell is a pleasant 48 y.o. year old male who presents to clinic today with Follow-up and Fatigue  on 05/25/2014  HPI:  Fatigue- feels it is progressing for past month or two.  Usually starts mid day.  Feels like he "doesnt have the energy to do anything." No CP or SOB. Denies feeling depressed- sees a psychotherapist regularly [name])- seeing her again on 8/31.  OSA- wears CPAP at night.  Last fitted in 11/2013.  Not taking many narcotics for his chronic pain.  Lab Results  Component Value Date   WBC 10.2 01/08/2014   HGB 14.9 01/08/2014   HCT 44.1 01/08/2014   MCV 93.7 01/08/2014   PLT 218.0 01/08/2014   Lab Results  Component Value Date   TSH 0.39 01/08/2014   Lab Results  Component Value Date   [id number] 427 01/08/2014   Current Outpatient Prescriptions on File Prior to Visit  Medication Sig Dispense Refill  . buPROPion (WELLBUTRIN XL) 300 MG 24 hr tablet TAKE ONE (1) TABLET EACH DAY  30 tablet  6  . citalopram (CELEXA) 40 MG tablet TAKE 1 TABLET BY MOUTH DAILY  30 tablet  6  . fexofenadine (ALLEGRA) 180 MG tablet Take 180 mg by mouth daily.      Marland Kitchen gabapentin (NEURONTIN) 400 MG capsule Take 1 capsule (400 mg total) by mouth 4 (four) times daily. Take 4 by mouth daily  90 capsule  3  . levothyroxine (SYNTHROID, LEVOTHROID) 75 MCG tablet TAKE ONE (1) TABLET EACH DAY  30 tablet  5  . lisinopril (PRINIVIL,ZESTRIL) 10 MG tablet TAKE ONE (1) TABLET BY MOUTH DAILY  30 tablet  6  . metoprolol succinate (TOPROL-XL) 50 MG 24 hr tablet TAKE ONE (1) TABLET EACH DAY  30 tablet  2  . montelukast (SINGULAIR) 10 MG tablet Take 1 tablet (10 mg total) by mouth at bedtime.  30 tablet  3  . omeprazole (PRILOSEC) 40 MG capsule Take 1 tab 30 in before breakfast daily.  90 capsule  3  . oxyCODONE-acetaminophen (PERCOCET/ROXICET) 5-325 MG per tablet Take 1 tablet by mouth every 6 (six) hours as  needed for severe pain.      Marland Kitchen tiZANidine (ZANAFLEX) 4 MG tablet Take 4 mg by mouth daily as needed for muscle spasms.      Marland Kitchen topiramate (TOPAMAX) 100 MG tablet TAKE ONE (1) TABLET EACH DAY  30 tablet  2   No current facility-administered medications on file prior to visit.    Allergies  Allergen Reactions  . Augmentin [Amoxicillin-Pot Clavulanate] Diarrhea  . Demerol [Meperidine]     "goes crazy and has the strength of 10 men"  . Eggs Or Egg-Derived Products Diarrhea and Nausea And Vomiting  . Statins Rash  . Sulfa Antibiotics Rash    Past Medical History  Diagnosis Date  . Carpal tunnel syndrome, bilateral   . H/O acute prostatitis   . Unspecified essential hypertension   . Diverticulitis of colon   . Esophageal reflux   . Mixed hyperlipidemia   . 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 Seasonal allergies   . Aphthous ulcer 10-04-12    improving  . Chronic pain syndrome   . Insomnia, unspecified   . Tympanic membrane perforation     history of  . Anxiety   . Lumbar degenerative disc disease   .  Hypothyroidism   . COPD (chronic obstructive pulmonary disease)   . Sleep apnea     uses cpap  . Headache(784.0)     hx of migraines  . Depression     Past Surgical History  Procedure Laterality Date  . Lumbarsacral disc surgery      L5-S1  . Tympanostomy tube placement  1975  . Meniscus repair Left     torn meniscus  . Nesbit procedure N/A 08/22/2013    Procedure: 16 DOT PLACTATION;  Surgeon: Claybon Jabs, MD;  Location: [facility];  Service: Urology;  Laterality: N/A;  . Vasectomy Bilateral 08/22/2013    Procedure: VASECTOMY;  Surgeon: Claybon Jabs, MD;  Location: [facility];  Service: Urology;  Laterality: Bilateral;    Family History  Problem Relation Age of Onset  . Bladder Cancer Paternal Uncle   . Renal cancer Paternal Uncle     renal cell carcinoma  . Congestive Heart Failure Father   . Heart disease Father   .  COPD Father   . Renal cancer Paternal Grandfather     renal call carcinoma  . Heart disease Brother     stents at 74 yo    History   Social History  . Marital Status: Married    Spouse Name: [name]    Number of Children: 0  . Years of Education: college   Occupational History  . disabled   .  Unemployed   Social History Main Topics  . Smoking status: Former Smoker -- 1.50 packs/day for 30 years    Types: Cigarettes    Quit date: 10/10/2007  . Smokeless tobacco: Never Used  . Alcohol Use: 0.6 oz/week    1 Cans of beer per week     Comment: rarely  . Drug Use: No  . Sexual Activity: Not on file   Other Topics Concern  . Not on file   Social History Narrative   Married.  [name])  No children. Patient has Best boy.   In process of getting disability for low back pain.   Right handed.   Caffeine- Coffee a pot but not every day.   The PMH, PSH, Social History, Family History, Medications, and allergies have been reviewed in [facility], and have been updated if relevant.   Review of Systems    See HPI No DOE  No CP No blood in stool Feels he is sleeping ok Appetite good- Wt Readings from Last 3 Encounters:  05/25/14 300 lb 8 oz (136.306 kg)  05/13/14 298 lb (135.172 kg)  04/16/14 297 lb (134.718 kg)  No HA No blurred vision  Objective:    BP 116/74  Pulse 48  Temp(Src) 97.8 F (36.6 C) (Oral)  Wt 300 lb 8 oz (136.306 kg)  SpO2 97%   Physical Exam General:  overweght male in NAD Eyes:  PERRL Ears:  External ear exam shows no significant lesions or deformities.  Otoscopic examination reveals clear canals, tympanic membranes are intact bilaterally without bulging, retraction, inflammation or discharge. Hearing is grossly normal bilaterally. Nose:  External nasal examination shows no deformity or inflammation. Nasal mucosa are pink and moist without lesions or exudates. Mouth:  Oral mucosa and oropharynx without lesions or exudates.  Teeth in good  repair. Neck:  no carotid bruit or thyromegaly no cervical or supraclavicular lymphadenopathy  Lungs:  Normal respiratory effort, chest expands symmetrically. Lungs are clear to auscultation, no crackles or wheezes. Heart:  Normal rate and regular rhythm.  S1 and S2 normal without gallop, murmur, click, rub or other extra sounds. Abdomen:  Bowel sounds positive,abdomen soft and non-tender without masses, organomegaly or hernias noted. Pulses:  R and L posterior tibial pulses are full and equal bilaterally  Extremities:  no edema          Assessment & Plan:   OSA (obstructive sleep apnea)  Other malaise and fatigue - Plan: CBC with Differential, Vitamin D, 25-hydroxy, Vitamin B12, TSH, Comprehensive metabolic panel, Hemoglobin A1c No Follow-up on file.

## 2014-05-25 NOTE — Assessment & Plan Note (Signed)
New and progressive. >25 minutes spent in face to face time with patient and his, >50% spent in counselling or coordination of care. Likely multifactorial- deconditioning/obesity. ?if CPAP is working properly- urged him to make appt to have this evaluated. Will check lab work as well today. Orders Placed This Encounter  Procedures  . CBC with Differential  . Vitamin D, 25-hydroxy  . Vitamin B12  . TSH  . Comprehensive metabolic panel  . Hemoglobin A1c

## 2014-05-25 NOTE — Telephone Encounter (Signed)
1- 2 tab by mouth daily as needed for pain.

## 2014-05-25 NOTE — Patient Instructions (Signed)
Good to see you. I will call you with your lab results.   

## 2014-05-25 NOTE — Telephone Encounter (Signed)
[facility] left v/m requesting verification of quantity and instructions for mobic.[facility] request cb.

## 2014-05-25 NOTE — Progress Notes (Signed)
Pre visit review using our clinic review tool, if applicable. No additional management support is needed unless otherwise documented below in the visit note. 

## 2014-05-25 NOTE — Telephone Encounter (Signed)
Spoke to pharmacy and advised of instruction per Dr Deborra Medina.

## 2014-05-26 LAB — TSH: TSH: 1.69 u[IU]/mL (ref 0.35–4.50)

## 2014-05-26 LAB — VITAMIN B12: Vitamin B-12: 335 pg/mL (ref 211–911)

## 2014-05-26 LAB — VITAMIN D 25 HYDROXY (VIT D DEFICIENCY, FRACTURES): VITD: 26.01 ng/mL — AB (ref 30.00–100.00)

## 2014-05-28 MED ORDER — VITAMIN D (ERGOCALCIFEROL) 1.25 MG (50000 UNIT) PO CAPS: 50000.0000 [IU] | ORAL_CAPSULE | ORAL | Status: DC

## 2014-05-28 NOTE — Addendum Note (Signed)
Addended by: Modena Nunnery on: 05/28/2014 03:30 PM   Modules accepted: Orders

## 2014-06-02 DIAGNOSIS — R413 Other amnesia: Secondary | ICD-10-CM | POA: Diagnosis not present

## 2014-06-03 ENCOUNTER — Ambulatory Visit (INDEPENDENT_AMBULATORY_CARE_PROVIDER_SITE_OTHER): Payer: BC Managed Care – PPO | Admitting: Family Medicine

## 2014-06-03 ENCOUNTER — Encounter: Payer: Self-pay | Admitting: Family Medicine

## 2014-06-03 ENCOUNTER — Telehealth: Payer: Self-pay

## 2014-06-03 VITALS — BP 118/74 | HR 47 | Temp 97.7°F | Wt 299.2 lb

## 2014-06-03 DIAGNOSIS — E1169 Type 2 diabetes mellitus with other specified complication: Secondary | ICD-10-CM | POA: Insufficient documentation

## 2014-06-03 DIAGNOSIS — E119 Type 2 diabetes mellitus without complications: Secondary | ICD-10-CM

## 2014-06-03 DIAGNOSIS — E785 Hyperlipidemia, unspecified: Secondary | ICD-10-CM | POA: Insufficient documentation

## 2014-06-03 MED ORDER — EZETIMIBE 10 MG PO TABS: 10.0000 mg | ORAL_TABLET | Freq: Every day | ORAL | Status: DC

## 2014-06-03 MED ORDER — ERYTHROMYCIN 2 % EX OINT: TOPICAL_OINTMENT | CUTANEOUS | Status: DC

## 2014-06-03 MED ORDER — METFORMIN HCL 500 MG PO TABS: 500.0000 mg | ORAL_TABLET | Freq: Every day | ORAL | Status: DC

## 2014-06-03 NOTE — Assessment & Plan Note (Signed)
New. >25 minutes spent in face to face time with patient, >50% spent in counselling or coordination of care Start Metformin 500 mg daily with breakfast since a1c not very high. Follow up in 3 months. Refer to diabetic teaching. Given eat right diet handout. On ACEI. Intolerant to statins.

## 2014-06-03 NOTE — Patient Instructions (Addendum)
Great to see you. We are starting Metformin 500 mg daily with breakfast.  We are referring you to diabetic teaching.  We are also starting Zetia 10 mg daily.  Please come see me in 3 months but return for labs in 8 weeks.

## 2014-06-03 NOTE — Assessment & Plan Note (Signed)
Not at goal for diabetic but he is intolerant to statins. Discussed that non statins do not have the same mortality data as statins, but we could try zetia to decrease his LDL. The patient indicates understanding of these issues and agrees with the plan. erx sent. Follow up lipid panel and CMET in 8 weeks.

## 2014-06-03 NOTE — Telephone Encounter (Signed)
Spoke to pharmacist, who states that the pt wanted the solution, Rx has been filled and picked up by patient.

## 2014-06-03 NOTE — Progress Notes (Signed)
Pre visit review using our clinic review tool, if applicable. No additional management support is needed unless otherwise documented below in the visit note. 

## 2014-06-03 NOTE — Telephone Encounter (Signed)
OK to change to gel

## 2014-06-03 NOTE — Telephone Encounter (Signed)
[facility] left v/m wanting to clarify Erythromycin ointment 2 %; pharmacist said to his knowledge does not make 2% ointment for topical use unless wants the gel that is used for acne, or bactroban or gentamycin ointment or there is 2% opthalmic ointment.  Request cb for clarification.

## 2014-06-03 NOTE — Progress Notes (Signed)
Subjective:   Patient ID: Corey Bell, male    DOB: [birth year], 48 y.o.   MRN: [id number]  Corey Bell is a pleasant 48 y.o. year old male who presents with his wife to clinic today with Results  on 06/03/2014  HPI: New onset diabetes- Lab Results  Component Value Date   HGBA1C 6.8* 05/25/2014   No Family h/o DM + fatigue + increased thirst and urination. On ACEI- Lisinopril 10 mg daily.  HLD-   Has been intolerant to multiple statins . Not at goal for a diabetic.  Does have family history of heart disease.  Also sees cardiologist, Dr. Aundra Dubin.  Lab Results  Component Value Date   CHOL 220* 07/01/2013   HDL 34.50* 07/01/2013   LDLDIRECT 149.5 07/01/2013   TRIG 222.0* 07/01/2013   CHOLHDL 6 07/01/2013    Current Outpatient Prescriptions on File Prior to Visit  Medication Sig Dispense Refill  . buPROPion (WELLBUTRIN XL) 300 MG 24 hr tablet TAKE ONE (1) TABLET EACH DAY  30 tablet  6  . citalopram (CELEXA) 40 MG tablet TAKE 1 TABLET BY MOUTH DAILY  30 tablet  6  . fexofenadine (ALLEGRA) 180 MG tablet Take 180 mg by mouth daily.      Marland Kitchen gabapentin (NEURONTIN) 400 MG capsule Take 1 capsule (400 mg total) by mouth 4 (four) times daily. Take 4 by mouth daily  90 capsule  3  . levothyroxine (SYNTHROID, LEVOTHROID) 75 MCG tablet TAKE ONE (1) TABLET EACH DAY  30 tablet  5  . lisinopril (PRINIVIL,ZESTRIL) 10 MG tablet TAKE ONE (1) TABLET BY MOUTH DAILY  30 tablet  6  . meloxicam (MOBIC) 15 MG tablet Take one to two tablets by mouth daily as needed Take one to two tablets by mouth daily as needed  60 tablet  1  . metoprolol succinate (TOPROL-XL) 50 MG 24 hr tablet TAKE ONE (1) TABLET EACH DAY  30 tablet  2  . montelukast (SINGULAIR) 10 MG tablet Take 1 tablet (10 mg total) by mouth at bedtime.  30 tablet  3  . omeprazole (PRILOSEC) 40 MG capsule Take 1 tab 30 in before breakfast daily.  90 capsule  3  . oxyCODONE-acetaminophen (PERCOCET/ROXICET) 5-325 MG per tablet Take 1 tablet by mouth  every 6 (six) hours as needed for severe pain.      Marland Kitchen tiZANidine (ZANAFLEX) 4 MG tablet Take 4 mg by mouth daily as needed for muscle spasms.      Marland Kitchen topiramate (TOPAMAX) 100 MG tablet TAKE ONE (1) TABLET EACH DAY  30 tablet  2  . Vitamin D, Ergocalciferol, (DRISDOL) 50000 UNITS CAPS capsule Take 1 capsule (50,000 Units total) by mouth every 7 (seven) days.  6 capsule  0   No current facility-administered medications on file prior to visit.    Allergies  Allergen Reactions  . Augmentin [Amoxicillin-Pot Clavulanate] Diarrhea  . Demerol [Meperidine]     "goes crazy and has the strength of 10 men"  . Eggs Or Egg-Derived Products Diarrhea and Nausea And Vomiting  . Statins Rash  . Sulfa Antibiotics Rash    Past Medical History  Diagnosis Date  . Carpal tunnel syndrome, bilateral   . H/O acute prostatitis   . Unspecified essential hypertension   . Diverticulitis of colon   . Esophageal reflux   . Mixed hyperlipidemia   . 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 Seasonal allergies   . Aphthous ulcer 10-04-12  improving  . Chronic pain syndrome   . Insomnia, unspecified   . Tympanic membrane perforation     history of  . Anxiety   . Lumbar degenerative disc disease   . Hypothyroidism   . COPD (chronic obstructive pulmonary disease)   . Sleep apnea     uses cpap  . Headache(784.0)     hx of migraines  . Depression     Past Surgical History  Procedure Laterality Date  . Lumbarsacral disc surgery      L5-S1  . Tympanostomy tube placement  1975  . Meniscus repair Left     torn meniscus  . Nesbit procedure N/A 08/22/2013    Procedure: 16 DOT PLACTATION;  Surgeon: Claybon Jabs, MD;  Location: [facility];  Service: Urology;  Laterality: N/A;  . Vasectomy Bilateral 08/22/2013    Procedure: VASECTOMY;  Surgeon: Claybon Jabs, MD;  Location: [facility];  Service: Urology;  Laterality: Bilateral;    Family History  Problem Relation Age  of Onset  . Bladder Cancer Paternal Uncle   . Renal cancer Paternal Uncle     renal cell carcinoma  . Congestive Heart Failure Father   . Heart disease Father   . COPD Father   . Renal cancer Paternal Grandfather     renal call carcinoma  . Heart disease Brother     stents at 56 yo    History   Social History  . Marital Status: Married    Spouse Name: Corey Bell    Number of Children: 0  . Years of Education: college   Occupational History  . disabled   .  Unemployed   Social History Main Topics  . Smoking status: Former Smoker -- 1.50 packs/day for 30 years    Types: Cigarettes    Quit date: 10/10/2007  . Smokeless tobacco: Never Used  . Alcohol Use: 0.6 oz/week    1 Cans of beer per week     Comment: rarely  . Drug Use: No  . Sexual Activity: Not on file   Other Topics Concern  . Not on file   Social History Narrative   Married.  Corey Bell)  No children. Patient has Best boy.   In process of getting disability for low back pain.   Right handed.   Caffeine- Coffee a pot but not every day.   The PMH, PSH, Social History, Family History, Medications, and allergies have been reviewed in [facility], and have been updated if relevant.    Review of Systems See HPI No blurred vision No changes in bowel habits or urinary changes No CP or SOB No abdominal pain No n/v/d    Objective:    BP 118/74  Pulse 47  Temp(Src) 97.7 F (36.5 C) (Oral)  Wt 299 lb 4 oz (135.739 kg)  SpO2 97%   Physical Exam  Gen:  Alert, pleasant, NAD Psych:  Good eye contact,  Not anxious or depressed appearing      Assessment & Plan:   Diabetes mellitus, new onset - Plan: Ambulatory referral to diabetic education  HLD (hyperlipidemia) No Follow-up on file.

## 2014-06-11 DIAGNOSIS — R413 Other amnesia: Secondary | ICD-10-CM | POA: Diagnosis not present

## 2014-06-22 ENCOUNTER — Ambulatory Visit: Payer: Self-pay | Admitting: Family Medicine

## 2014-06-24 ENCOUNTER — Telehealth: Payer: Self-pay | Admitting: *Deleted

## 2014-06-24 NOTE — Telephone Encounter (Signed)
Results of neuro psychological evaluation results faxed today 06-24-14.  To Dr. Janann Colonel via MR.

## 2014-06-26 ENCOUNTER — Encounter: Payer: Self-pay | Admitting: Family Medicine

## 2014-06-26 ENCOUNTER — Ambulatory Visit (INDEPENDENT_AMBULATORY_CARE_PROVIDER_SITE_OTHER): Payer: BC Managed Care – PPO | Admitting: Family Medicine

## 2014-06-26 VITALS — BP 134/78 | HR 73 | Temp 98.2°F | Wt 305.5 lb

## 2014-06-26 DIAGNOSIS — E119 Type 2 diabetes mellitus without complications: Secondary | ICD-10-CM

## 2014-06-26 MED ORDER — OMEPRAZOLE 40 MG PO CPDR: DELAYED_RELEASE_CAPSULE | ORAL | Status: DC

## 2014-06-26 MED ORDER — GLUCOSE BLOOD VI STRP: ORAL_STRIP | Status: DC

## 2014-06-26 MED ORDER — OXYCODONE-ACETAMINOPHEN 5-325 MG PO TABS: 1.0000 | ORAL_TABLET | Freq: Four times a day (QID) | ORAL | Status: DC | PRN

## 2014-06-26 MED ORDER — HYOSCYAMINE SULFATE 0.125 MG SL SUBL: SUBLINGUAL_TABLET | SUBLINGUAL | Status: DC

## 2014-06-26 NOTE — Assessment & Plan Note (Signed)
>  25 minutes spent in face to face time with patient, >50% spent in counselling or coordination of care Answered his questions. Referred to opthalmology. Follow up as already scheduled.

## 2014-06-26 NOTE — Progress Notes (Signed)
Subjective:   Patient ID: Corey Bell, male    DOB: [birth year], 48 y.o.   MRN: [id number]  Corey Bell is a pleasant 48 y.o. year old male who presents with his wife to clinic today with Follow-up  on 06/26/2014  HPI: New onset diabetes- Lab Results  Component Value Date   HGBA1C 6.8* 05/25/2014   No Family h/o DM + fatigue + increased thirst and urination. On ACEI- Lisinopril 10 mg daily. Started on Metformin 500 mg daily on 06/03/14.  Also added Zetia since LDL not at goal for diabetic (he is aware that does not have the same mortality data as statins).   Checking FSBS twice daily.   Has been intolerant to multiple statins .  Does have family history of heart disease.  Also sees cardiologist, Dr. Aundra Dubin.  Also referred him for diabetic teaching.  This is what he wants to discuss today.  He is starting more classes soon.  He has not seen an eye doctor yet. Lab Results  Component Value Date   CHOL 220* 07/01/2013   HDL 34.50* 07/01/2013   LDLDIRECT 149.5 07/01/2013   TRIG 222.0* 07/01/2013   CHOLHDL 6 07/01/2013    Current Outpatient Prescriptions on File Prior to Visit  Medication Sig Dispense Refill  . buPROPion (WELLBUTRIN XL) 300 MG 24 hr tablet TAKE ONE (1) TABLET EACH DAY  30 tablet  6  . citalopram (CELEXA) 40 MG tablet TAKE 1 TABLET BY MOUTH DAILY  30 tablet  6  . Erythromycin 2 % ointment Apply topically twice daily as need  25 g  0  . ezetimibe (ZETIA) 10 MG tablet Take 1 tablet (10 mg total) by mouth daily.  30 tablet  3  . fexofenadine (ALLEGRA) 180 MG tablet Take 180 mg by mouth daily.      Marland Kitchen gabapentin (NEURONTIN) 400 MG capsule Take 1 capsule (400 mg total) by mouth 4 (four) times daily. Take 4 by mouth daily  90 capsule  3  . levothyroxine (SYNTHROID, LEVOTHROID) 75 MCG tablet TAKE ONE (1) TABLET EACH DAY  30 tablet  5  . lisinopril (PRINIVIL,ZESTRIL) 10 MG tablet TAKE ONE (1) TABLET BY MOUTH DAILY  30 tablet  6  . meloxicam (MOBIC) 15 MG tablet Take one  to two tablets by mouth daily as needed Take one to two tablets by mouth daily as needed  60 tablet  1  . metFORMIN (GLUCOPHAGE) 500 MG tablet Take 1 tablet (500 mg total) by mouth daily with breakfast.  30 tablet  3  . metoprolol succinate (TOPROL-XL) 50 MG 24 hr tablet TAKE ONE (1) TABLET EACH DAY  30 tablet  2  . montelukast (SINGULAIR) 10 MG tablet Take 1 tablet (10 mg total) by mouth at bedtime.  30 tablet  3  . omeprazole (PRILOSEC) 40 MG capsule Take 1 tab 30 in before breakfast daily.  90 capsule  3  . oxyCODONE-acetaminophen (PERCOCET/ROXICET) 5-325 MG per tablet Take 1 tablet by mouth every 6 (six) hours as needed for severe pain.      Marland Kitchen tiZANidine (ZANAFLEX) 4 MG tablet Take 4 mg by mouth daily as needed for muscle spasms.      Marland Kitchen topiramate (TOPAMAX) 100 MG tablet TAKE ONE (1) TABLET EACH DAY  30 tablet  2  . vitamin B-12 (CYANOCOBALAMIN) 1000 MCG tablet Take 1,000 mcg by mouth daily.      . Vitamin D, Ergocalciferol, (DRISDOL) 50000 UNITS CAPS capsule Take 1 capsule (50,000 Units  total) by mouth every 7 (seven) days.  6 capsule  0   No current facility-administered medications on file prior to visit.    Allergies  Allergen Reactions  . Augmentin [Amoxicillin-Pot Clavulanate] Diarrhea  . Demerol [Meperidine]     "goes crazy and has the strength of 10 men"  . Eggs Or Egg-Derived Products Diarrhea and Nausea And Vomiting  . Statins Rash  . Sulfa Antibiotics Rash    Past Medical History  Diagnosis Date  . Carpal tunnel syndrome, bilateral   . H/O acute prostatitis   . Unspecified essential hypertension   . Diverticulitis of colon   . Esophageal reflux   . Mixed hyperlipidemia   . 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 Seasonal allergies   . Aphthous ulcer 10-04-12    improving  . Chronic pain syndrome   . Insomnia, unspecified   . Tympanic membrane perforation     history of  . Anxiety   . Lumbar degenerative disc disease   . Hypothyroidism   . COPD (chronic  obstructive pulmonary disease)   . Sleep apnea     uses cpap  . Headache(784.0)     hx of migraines  . Depression     Past Surgical History  Procedure Laterality Date  . Lumbarsacral disc surgery      L5-S1  . Tympanostomy tube placement  1975  . Meniscus repair Left     torn meniscus  . Nesbit procedure N/A 08/22/2013    Procedure: 16 DOT PLACTATION;  Surgeon: Claybon Jabs, MD;  Location: [facility];  Service: Urology;  Laterality: N/A;  . Vasectomy Bilateral 08/22/2013    Procedure: VASECTOMY;  Surgeon: Claybon Jabs, MD;  Location[facility];  Service: Urology;  Laterality: Bilateral;    Family History  Problem Relation Age of Onset  . Bladder Cancer Paternal Uncle   . Renal cancer Paternal Uncle     renal cell carcinoma  . Congestive Heart Failure Father   . Heart disease Father   . COPD Father   . Renal cancer Paternal Grandfather     renal call carcinoma  . Heart disease Brother     stents at 40 yo    History   Social History  . Marital Status: Married    Spouse Name: [name]    Number of Children: 0  . Years of Education: college   Occupational History  . disabled   .  Unemployed   Social History Main Topics  . Smoking status: Former Smoker -- 1.50 packs/day for 30 years    Types: Cigarettes    Quit date: 10/10/2007  . Smokeless tobacco: Never Used  . Alcohol Use: 0.6 oz/week    1 Cans of beer per week     Comment: rarely  . Drug Use: No  . Sexual Activity: Not on file   Other Topics Concern  . Not on file   Social History Narrative   Married.  [name])  No children. Patient has Best boy.   In process of getting disability for low back pain.   Right handed.   Caffeine- Coffee a pot but not every day.   The PMH, PSH, Social History, Family History, Medications, and allergies have been reviewed in [facility], and have been updated if relevant.    Review of Systems See HPI No blurred vision No changes in  bowel habits or urinary changes No CP or SOB No abdominal pain No n/v/d  Objective:    BP 134/78  Pulse 73  Temp(Src) 98.2 F (36.8 C) (Oral)  Wt 305 lb 8 oz (138.574 kg)  SpO2 93%   Physical Exam  Gen:  Alert, pleasant, NAD Psych:  Good eye contact,  Not anxious or depressed appearing      Assessment & Plan:   Diabetes mellitus, new onset - Plan: Ambulatory referral to Ophthalmology No Follow-up on file.

## 2014-06-26 NOTE — Patient Instructions (Signed)
Great to see you. I am proud of all the work the you are doing!

## 2014-06-26 NOTE — Progress Notes (Signed)
Pre visit review using our clinic review tool, if applicable. No additional management support is needed unless otherwise documented below in the visit note. 

## 2014-07-02 ENCOUNTER — Telehealth: Payer: Self-pay

## 2014-07-02 ENCOUNTER — Other Ambulatory Visit: Payer: Self-pay | Admitting: Family Medicine

## 2014-07-02 MED ORDER — GLUCOSE BLOOD VI STRP: ORAL_STRIP | Status: DC

## 2014-07-02 MED ORDER — GLUCOSE BLOOD VI STRP
ORAL_STRIP | Status: DC
Start: 2014-07-02 — End: 2014-10-21

## 2014-07-02 MED ORDER — BAYER CONTOUR MONITOR DEVI: Status: DC

## 2014-07-02 NOTE — Telephone Encounter (Signed)
Spoke to pt and informed him that per covermymeds, Rx for relion has been denied. Pt must use a meter and strips that are covered. Pt verbally expressed understanding and new Rx for [facility] meter and strips sent to requested pharmacy

## 2014-07-02 NOTE — Telephone Encounter (Signed)
Pt could not pick up relion test strips due to prior auth needed. Spoke with Juanda Crumble at [employer] and pts ins will approve one touch and contour or contour next. Spoke with pt and he has relion meter and lancets and prefers to get PA done for test strips; pt checks BS twice a day. Pt request cb when ins responds.

## 2014-07-02 NOTE — Addendum Note (Signed)
Addended by: Modena Nunnery on: 07/02/2014 02:52 PM   Modules accepted: Orders

## 2014-07-06 ENCOUNTER — Other Ambulatory Visit: Payer: Self-pay | Admitting: *Deleted

## 2014-07-06 MED ORDER — GLUCOSE BLOOD VI STRP: ORAL_STRIP | Status: DC

## 2014-07-07 ENCOUNTER — Other Ambulatory Visit: Payer: Self-pay | Admitting: *Deleted

## 2014-07-07 MED ORDER — BUPROPION HCL ER (XL) 300 MG PO TB24: ORAL_TABLET | ORAL | Status: DC

## 2014-07-09 ENCOUNTER — Ambulatory Visit: Payer: Self-pay | Admitting: Family Medicine

## 2014-07-17 ENCOUNTER — Other Ambulatory Visit: Payer: Self-pay | Admitting: Family Medicine

## 2014-07-20 ENCOUNTER — Other Ambulatory Visit: Payer: Self-pay | Admitting: Family Medicine

## 2014-07-23 ENCOUNTER — Telehealth: Payer: Self-pay

## 2014-07-23 NOTE — Telephone Encounter (Signed)
Lm on pts vm requesting a call back 

## 2014-07-23 NOTE — Telephone Encounter (Signed)
Pt left v/m; when diabetic test strips were refilled with instructions test BS once daily. Pt said he has been testing twice daily and request cb.

## 2014-07-24 NOTE — Telephone Encounter (Signed)
Spoke to pt and informed him that he is unable to test twice daily, unless he is on insulin.

## 2014-07-27 ENCOUNTER — Other Ambulatory Visit (INDEPENDENT_AMBULATORY_CARE_PROVIDER_SITE_OTHER): Payer: BC Managed Care – PPO

## 2014-07-27 DIAGNOSIS — I10 Essential (primary) hypertension: Secondary | ICD-10-CM

## 2014-07-27 DIAGNOSIS — E550 Rickets, active: Secondary | ICD-10-CM

## 2014-07-27 DIAGNOSIS — E785 Hyperlipidemia, unspecified: Secondary | ICD-10-CM

## 2014-07-27 LAB — BASIC METABOLIC PANEL
BUN: 18 mg/dL (ref 6–23)
CALCIUM: 9.1 mg/dL (ref 8.4–10.5)
CHLORIDE: 108 meq/L (ref 96–112)
CO2: 26 mEq/L (ref 19–32)
CREATININE: 1.2 mg/dL (ref 0.4–1.5)
GFR: 66.2 mL/min (ref >60.00)
GLUCOSE: 139 mg/dL — AB (ref 70–99)
Potassium: 4.5 mEq/L (ref 3.5–5.1)
Sodium: 142 mEq/L (ref 135–145)

## 2014-07-27 LAB — LIPID PANEL
Cholesterol: 153 mg/dL (ref 0–200)
HDL: 26 mg/dL — AB (ref >39.00)
LDL Cholesterol: 91 mg/dL (ref 0–99)
NonHDL: 127
Total CHOL/HDL Ratio: 6
Triglycerides: 179 mg/dL — ABNORMAL HIGH (ref 0.0–149.0)
VLDL: 35.8 mg/dL (ref 0.0–40.0)

## 2014-07-27 LAB — HEPATIC FUNCTION PANEL
ALBUMIN: 3.6 g/dL (ref 3.5–5.2)
ALK PHOS: 61 U/L (ref 39–117)
ALT: 89 U/L — ABNORMAL HIGH (ref 0–53)
AST: 51 U/L — AB (ref 0–37)
Bilirubin, Direct: 0.2 mg/dL (ref 0.0–0.3)
Total Bilirubin: 1 mg/dL (ref 0.2–1.2)
Total Protein: 7.5 g/dL (ref 6.0–8.3)

## 2014-07-27 LAB — VITAMIN D 25 HYDROXY (VIT D DEFICIENCY, FRACTURES): VITD: 29.83 ng/mL — AB (ref 30.00–100.00)

## 2014-07-28 ENCOUNTER — Other Ambulatory Visit: Payer: Self-pay | Admitting: Family Medicine

## 2014-07-29 ENCOUNTER — Other Ambulatory Visit: Payer: BC Managed Care – PPO

## 2014-07-30 MED ORDER — VITAMIN D (ERGOCALCIFEROL) 1.25 MG (50000 UNIT) PO CAPS: 50000.0000 [IU] | ORAL_CAPSULE | ORAL | Status: DC

## 2014-07-30 NOTE — Addendum Note (Signed)
Addended by: Marchia Bond on: 07/30/2014 10:34 AM   Modules accepted: Orders

## 2014-08-03 ENCOUNTER — Telehealth: Payer: Self-pay | Admitting: Cardiology

## 2014-08-03 NOTE — Telephone Encounter (Signed)
°  Patient is returning call from [place] last week regarding labs, please call and advise.

## 2014-08-03 NOTE — Telephone Encounter (Signed)
Spoke with patient about recent lab results 07/27/14

## 2014-08-06 ENCOUNTER — Telehealth: Payer: Self-pay

## 2014-08-06 DIAGNOSIS — E550 Rickets, active: Secondary | ICD-10-CM

## 2014-08-06 NOTE — Telephone Encounter (Signed)
Pt left v/m; pt received recent lab results and [facility] has not received vit d rx. Pt request cb.

## 2014-08-07 ENCOUNTER — Ambulatory Visit: Payer: BC Managed Care – PPO | Admitting: Neurology

## 2014-08-07 MED ORDER — VITAMIN D (ERGOCALCIFEROL) 1.25 MG (50000 UNIT) PO CAPS: 50000.0000 [IU] | ORAL_CAPSULE | ORAL | Status: DC

## 2014-08-07 NOTE — Telephone Encounter (Signed)
I mistakenly chose the wrong pharmacy of the two listed in his chart when I initially sent the rx. I just sent it to the correct [facility], and lmov that it was taken care of.

## 2014-08-09 ENCOUNTER — Ambulatory Visit: Payer: Self-pay | Admitting: Family Medicine

## 2014-08-19 ENCOUNTER — Other Ambulatory Visit: Payer: Self-pay | Admitting: Family Medicine

## 2014-08-24 ENCOUNTER — Ambulatory Visit: Payer: BC Managed Care – PPO | Admitting: Neurology

## 2014-08-25 LAB — HM DIABETES EYE EXAM

## 2014-08-26 ENCOUNTER — Other Ambulatory Visit: Payer: Self-pay | Admitting: Family Medicine

## 2014-08-27 ENCOUNTER — Encounter: Payer: BC Managed Care – PPO | Admitting: Neurology

## 2014-08-27 NOTE — Progress Notes (Signed)
This encounter was created in error - please disregard.

## 2014-08-28 ENCOUNTER — Encounter: Payer: Self-pay | Admitting: Family Medicine

## 2014-09-07 ENCOUNTER — Ambulatory Visit (INDEPENDENT_AMBULATORY_CARE_PROVIDER_SITE_OTHER): Payer: BC Managed Care – PPO | Admitting: Family Medicine

## 2014-09-07 ENCOUNTER — Encounter: Payer: Self-pay | Admitting: *Deleted

## 2014-09-07 VITALS — BP 128/70 | HR 54 | Temp 97.8°F | Wt 296.5 lb

## 2014-09-07 DIAGNOSIS — F329 Major depressive disorder, single episode, unspecified: Secondary | ICD-10-CM

## 2014-09-07 DIAGNOSIS — E119 Type 2 diabetes mellitus without complications: Secondary | ICD-10-CM

## 2014-09-07 DIAGNOSIS — E785 Hyperlipidemia, unspecified: Secondary | ICD-10-CM

## 2014-09-07 DIAGNOSIS — F32A Depression, unspecified: Secondary | ICD-10-CM

## 2014-09-07 DIAGNOSIS — I10 Essential (primary) hypertension: Secondary | ICD-10-CM

## 2014-09-07 LAB — COMPREHENSIVE METABOLIC PANEL
ALT: 61 U/L — AB (ref 0–53)
AST: 40 U/L — ABNORMAL HIGH (ref 0–37)
Albumin: 4.1 g/dL (ref 3.5–5.2)
Alkaline Phosphatase: 63 U/L (ref 39–117)
BILIRUBIN TOTAL: 1.2 mg/dL (ref 0.2–1.2)
BUN: 20 mg/dL (ref 6–23)
CALCIUM: 9.5 mg/dL (ref 8.4–10.5)
CO2: 26 meq/L (ref 19–32)
Chloride: 103 mEq/L (ref 96–112)
Creatinine, Ser: 1.4 mg/dL (ref 0.4–1.5)
GFR: 59.47 mL/min — AB (ref >60.00)
GLUCOSE: 124 mg/dL — AB (ref 70–99)
Potassium: 4.5 mEq/L (ref 3.5–5.1)
Sodium: 136 mEq/L (ref 135–145)
Total Protein: 7.2 g/dL (ref 6.0–8.3)

## 2014-09-07 LAB — MICROALBUMIN / CREATININE URINE RATIO
Creatinine,U: 133.2 mg/dL
MICROALB UR: 1.8 mg/dL (ref 0.0–1.9)
MICROALB/CREAT RATIO: 1.4 mg/g (ref 0.0–30.0)

## 2014-09-07 LAB — HEMOGLOBIN A1C: HEMOGLOBIN A1C: 6.3 % (ref 4.6–6.5)

## 2014-09-07 NOTE — Progress Notes (Signed)
Pre visit review using our clinic review tool, if applicable. No additional management support is needed unless otherwise documented below in the visit note. 

## 2014-09-07 NOTE — Assessment & Plan Note (Signed)
LDL at goal for diabetic.

## 2014-09-07 NOTE — Progress Notes (Signed)
Subjective:   Patient ID: Corey Bell, male    DOB: August 26, 1966, 48 y.o.   MRN: [id number]  Corey Bell is a pleasant 48 y.o. year old male who presents with his wife to clinic today with Follow-up  on 09/07/2014  HPI: DM- diagnosed in August 2015.  Lab Results  Component Value Date   HGBA1C 6.8* 05/25/2014   No Family h/o DM  On ACEI- Lisinopril 10 mg daily. Started on Metformin 500 mg daily on 06/03/14.  Also added Zetia since LDL not at goal for diabetic (he is aware that does not have the same mortality data as statins).   Checking FSBS twice daily. FSBS ranging 106- 144.  Did have one episode of hypoglycemia- dropped in to 72s.  Skipped a meal- felt shaky and weak.  When ate something, symptoms resolved.   Has been intolerant to multiple statins. Lab Results  Component Value Date   CHOL 153 07/27/2014   HDL 26.00* 07/27/2014   LDLCALC 91 07/27/2014   LDLDIRECT 149.5 07/01/2013   TRIG 179.0* 07/27/2014   CHOLHDL 6 07/27/2014   Lab Results  Component Value Date   ALT 89* 07/27/2014   AST 51* 07/27/2014   GGT 35 02/23/2014   ALKPHOS 61 07/27/2014   BILITOT 1.0 07/27/2014    Does have family history of heart disease.  Also sees cardiologist, Dr. Aundra Dubin.  Also referred him for diabetic teaching- he did have appt- neg for diabetic retinopathy, Dr. Valetta Close- 08/25/14. He finished with Diabetic teaching. Due for urine micro.  He has not seen an eye doctor yet- I referred him to opthalmology in September.  Lab Results  Component Value Date   CHOL 153 07/27/2014   HDL 26.00* 07/27/2014   LDLCALC 91 07/27/2014   LDLDIRECT 149.5 07/01/2013   TRIG 179.0* 07/27/2014   CHOLHDL 6 07/27/2014    Current Outpatient Prescriptions on File Prior to Visit  Medication Sig Dispense Refill  . Blood Glucose Monitoring Suppl (CONTOUR BLOOD GLUCOSE SYSTEM) DEVI Use to test blood sugar once daily   250.00 1 Device 0  . buPROPion (WELLBUTRIN XL) 300 MG 24 hr tablet TAKE ONE (1)  TABLET EACH DAY 30 tablet 2  . citalopram (CELEXA) 40 MG tablet TAKE 1 TABLET BY MOUTH DAILY 30 tablet 1  . Erythromycin 2 % ointment Apply topically twice daily as need 25 g 0  . ezetimibe (ZETIA) 10 MG tablet Take 1 tablet (10 mg total) by mouth daily. 30 tablet 3  . fexofenadine (ALLEGRA) 180 MG tablet Take 180 mg by mouth daily.    Marland Kitchen gabapentin (NEURONTIN) 400 MG capsule TAKE ONE (1) CAPSULE FOUR (4) TIMES EACH DAY 120 capsule 1  . glucose blood (BAYER CONTOUR NEXT TEST) test strip Use as instructed 100 each 12  . glucose blood test strip Use to test blood sugar once daily  250.00 100 each 2  . hyoscyamine (LEVSIN SL) 0.125 MG SL tablet TAKE ONE TABLET TWICE DAILY 60 tablet 1  . levothyroxine (SYNTHROID, LEVOTHROID) 75 MCG tablet TAKE ONE (1) TABLET EACH DAY 30 tablet 5  . lisinopril (PRINIVIL,ZESTRIL) 10 MG tablet TAKE ONE (1) TABLET BY MOUTH DAILY 30 tablet 6  . meloxicam (MOBIC) 15 MG tablet Take one to two tablets by mouth daily as needed Take one to two tablets by mouth daily as needed 60 tablet 1  . metFORMIN (GLUCOPHAGE) 500 MG tablet Take 1 tablet (500 mg total) by mouth daily with breakfast. 30 tablet 3  .  metoprolol succinate (TOPROL-XL) 50 MG 24 hr tablet TAKE ONE (1) TABLET EACH DAY 30 tablet 2  . montelukast (SINGULAIR) 10 MG tablet Take 1 tablet (10 mg total) by mouth at bedtime. 30 tablet 3  . omeprazole (PRILOSEC) 40 MG capsule Take 1 tab 30 in before breakfast daily. 90 capsule 3  . oxyCODONE-acetaminophen (PERCOCET/ROXICET) 5-325 MG per tablet Take 1 tablet by mouth every 6 (six) hours as needed for severe pain. 30 tablet 0  . tiZANidine (ZANAFLEX) 4 MG tablet Take 4 mg by mouth daily as needed for muscle spasms.    Marland Kitchen topiramate (TOPAMAX) 100 MG tablet TAKE ONE (1) TABLET EACH DAY 30 tablet 2  . vitamin B-12 (CYANOCOBALAMIN) 1000 MCG tablet Take 1,000 mcg by mouth daily.     No current facility-administered medications on file prior to visit.    Allergies  Allergen  Reactions  . Augmentin [Amoxicillin-Pot Clavulanate] Diarrhea  . Demerol [Meperidine]     "goes crazy and has the strength of 10 men"  . Eggs Or Egg-Derived Products Diarrhea and Nausea And Vomiting  . Statins Rash  . Sulfa Antibiotics Rash    Past Medical History  Diagnosis Date  . Carpal tunnel syndrome, bilateral   . H/O acute prostatitis   . Unspecified essential hypertension   . Diverticulitis of colon   . Esophageal reflux   . Mixed hyperlipidemia   . 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 Seasonal allergies   . Aphthous ulcer 10-04-12    improving  . Chronic pain syndrome   . Insomnia, unspecified   . Tympanic membrane perforation     history of  . Anxiety   . Lumbar degenerative disc disease   . Hypothyroidism   . COPD (chronic obstructive pulmonary disease)   . Sleep apnea     uses cpap  . Headache(784.0)     hx of migraines  . Depression     Past Surgical History  Procedure Laterality Date  . Lumbarsacral disc surgery      L5-S1  . Tympanostomy tube placement  1975  . Meniscus repair Left     torn meniscus  . Nesbit procedure N/A 08/22/2013    Procedure: 16 DOT PLACTATION;  Surgeon: Claybon Jabs, MD;  Location: [facility];  Service: Urology;  Laterality: N/A;  . Vasectomy Bilateral 08/22/2013    Procedure: VASECTOMY;  Surgeon: Claybon Jabs, MD;  Location: [facility];  Service: Urology;  Laterality: Bilateral;    Family History  Problem Relation Age of Onset  . Bladder Cancer Paternal Uncle   . Renal cancer Paternal Uncle     renal cell carcinoma  . Congestive Heart Failure Father   . Heart disease Father   . COPD Father   . Renal cancer Paternal Grandfather     renal call carcinoma  . Heart disease Brother     stents at 14 yo    History   Social History  . Marital Status: Married    Spouse Name: [name]    Number of Children: 0  . Years of Education: college   Occupational History  . disabled   .   Unemployed   Social History Main Topics  . Smoking status: Former Smoker -- 1.50 packs/day for 30 years    Types: Cigarettes    Quit date: 10/10/2007  . Smokeless tobacco: Never Used  . Alcohol Use: 0.6 oz/week    1 Cans of beer per week  Comment: rarely  . Drug Use: No  . Sexual Activity: Not on file   Other Topics Concern  . Not on file   Social History Narrative   Married.  [name])  No children. Patient has Best boy.   In process of getting disability for low back pain.   Right handed.   Caffeine- Coffee a pot but not every day.   The PMH, PSH, Social History, Family History, Medications, and allergies have been reviewed in [facility], and have been updated if relevant.    Review of Systems See HPI No blurred vision No changes in bowel habits or urinary changes No CP or SOB No abdominal pain No n/v/d    Objective:    BP 128/70 mmHg  Pulse 54  Temp(Src) 97.8 F (36.6 C) (Oral)  Wt 296 lb 8 oz (134.492 kg)  SpO2 97%   Physical Exam  Gen:  Alert, pleasant, NAD Resp:  CTA bilaterally CVS:  RRR Psych:  Good eye contact,  Not anxious or depressed appearing Ext:  No edema      Assessment & Plan:   Essential hypertension  Depression  HLD (hyperlipidemia)  Diabetes mellitus, new onset - Plan: Hemoglobin A1c, Comprehensive metabolic panel, Microalbumin / creatinine urine ratio No Follow-up on file.

## 2014-09-07 NOTE — Patient Instructions (Signed)
Great to see you. I am so pleased with your blood sugars. Come see me in 3 months.

## 2014-09-07 NOTE — Assessment & Plan Note (Signed)
Due for a1c, urine micro. LDL improved. Check labs today. Orders Placed This Encounter  Procedures  . Hemoglobin A1c  . Comprehensive metabolic panel  . Microalbumin / creatinine urine ratio

## 2014-09-07 NOTE — Assessment & Plan Note (Signed)
Well controlled.  No changes made. 

## 2014-09-08 ENCOUNTER — Telehealth: Payer: Self-pay | Admitting: Family Medicine

## 2014-09-08 ENCOUNTER — Telehealth: Payer: Self-pay | Admitting: Neurology

## 2014-09-08 ENCOUNTER — Ambulatory Visit: Payer: Self-pay | Admitting: Family Medicine

## 2014-09-08 NOTE — Telephone Encounter (Signed)
Patient calling to reschedule appointment from 11/19 with Dr. Rexene Alberts, the day the power went off.  The first available next appointment will be 2/4 @ 11:00 am.  Patient requesting an earlier appointment.  Please call and advise.

## 2014-09-08 NOTE — Telephone Encounter (Signed)
emmi emailed °

## 2014-09-09 NOTE — Telephone Encounter (Signed)
Called patient to schedule for sooner appointment but he said that he has switched to  Dr Jaynee Eagles

## 2014-09-16 ENCOUNTER — Ambulatory Visit (INDEPENDENT_AMBULATORY_CARE_PROVIDER_SITE_OTHER): Payer: BC Managed Care – PPO | Admitting: Neurology

## 2014-09-16 ENCOUNTER — Encounter: Payer: Self-pay | Admitting: Neurology

## 2014-09-16 VITALS — BP 108/67 | HR 60 | Ht 74.0 in | Wt 306.0 lb

## 2014-09-16 DIAGNOSIS — R251 Tremor, unspecified: Secondary | ICD-10-CM

## 2014-09-16 DIAGNOSIS — R419 Unspecified symptoms and signs involving cognitive functions and awareness: Secondary | ICD-10-CM

## 2014-09-16 MED ORDER — TOPIRAMATE 25 MG PO TABS: 25.0000 mg | ORAL_TABLET | Freq: Every day | ORAL | Status: DC

## 2014-09-16 NOTE — Patient Instructions (Addendum)
Overall you are doing fairly well but I do want to suggest a few things today:   Remember to drink plenty of fluid, eat healthy meals and do not skip any meals. Try to eat protein with a every meal and eat a healthy snack such as fruit or nuts in between meals. Try to keep a regular sleep-wake schedule and try to exercise daily, particularly in the form of walking, 20-30 minutes a day, if you can.   As far as your medications are concerned, I would like to suggest: Decreasing Topamax to 50mg  at night, then to 25mg  at night then stop. Decrease Tizanidine.   As far as diagnostic testing: We will do a CAT scan  I would like to see you back in 3-4 months, sooner if we need to. Please call us with any interim questions, concerns, problems, updates or refill requests.   Please also call us for any test results so we can go over those with you on the phone.  My clinical assistant and will answer any of your questions and relay your messages to me and also relay most of my messages to you.   Our phone number is [phone]. We also have an after hours call service for urgent matters and there is a physician on-call for urgent questions. For any emergencies you know to call 911 or go to the nearest emergency room
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
## 2014-09-24 ENCOUNTER — Other Ambulatory Visit: Payer: Self-pay | Admitting: Neurology

## 2014-09-28 ENCOUNTER — Other Ambulatory Visit: Payer: Self-pay | Admitting: Family Medicine

## 2014-09-28 MED ORDER — MONTELUKAST SODIUM 10 MG PO TABS: 10.0000 mg | ORAL_TABLET | Freq: Every day | ORAL | Status: DC

## 2014-09-29 ENCOUNTER — Other Ambulatory Visit: Payer: Self-pay | Admitting: Family Medicine

## 2014-09-29 DIAGNOSIS — E559 Vitamin D deficiency, unspecified: Secondary | ICD-10-CM

## 2014-10-05 ENCOUNTER — Other Ambulatory Visit: Payer: Self-pay | Admitting: Family Medicine

## 2014-10-06 ENCOUNTER — Other Ambulatory Visit (INDEPENDENT_AMBULATORY_CARE_PROVIDER_SITE_OTHER): Payer: BC Managed Care – PPO

## 2014-10-06 DIAGNOSIS — E559 Vitamin D deficiency, unspecified: Secondary | ICD-10-CM

## 2014-10-06 LAB — VITAMIN D 25 HYDROXY (VIT D DEFICIENCY, FRACTURES): VITD: 23.3 ng/mL — AB (ref 30.00–100.00)

## 2014-10-14 MED ORDER — VITAMIN D (ERGOCALCIFEROL) 1.25 MG (50000 UNIT) PO CAPS: 50000.0000 [IU] | ORAL_CAPSULE | ORAL | Status: DC

## 2014-10-14 NOTE — Addendum Note (Signed)
Addended by: Modena Nunnery on: 10/14/2014 09:39 AM   Modules accepted: Orders

## 2014-10-19 ENCOUNTER — Other Ambulatory Visit: Payer: Self-pay | Admitting: Family Medicine

## 2014-10-20 ENCOUNTER — Telehealth: Payer: Self-pay | Admitting: Neurology

## 2014-10-20 NOTE — Telephone Encounter (Signed)
Spoke to patient, results of DAT scan were WNL.

## 2014-10-20 NOTE — Telephone Encounter (Signed)
Patient calling for results of DAT Scan.  Please call and advise.

## 2014-10-21 ENCOUNTER — Telehealth: Payer: Self-pay

## 2014-10-21 MED ORDER — ONETOUCH BASIC SYSTEM W/DEVICE KIT: PACK | Status: DC

## 2014-10-21 MED ORDER — ONETOUCH LANCETS MISC: Status: DC

## 2014-10-21 MED ORDER — GLUCOSE BLOOD VI STRP: ORAL_STRIP | Status: DC

## 2014-10-21 NOTE — Telephone Encounter (Signed)
Pt left v/m;pt ins changed and one touch meter and diabetic supplies are covered. Pt did not request cb but if any questions call [facility]. Spoke with [facility] and advised to choose basic one touch diabetic supplies. Done.

## 2014-10-26 ENCOUNTER — Encounter: Payer: Self-pay | Admitting: Neurology

## 2014-10-26 ENCOUNTER — Ambulatory Visit (INDEPENDENT_AMBULATORY_CARE_PROVIDER_SITE_OTHER): Payer: 59 | Admitting: Neurology

## 2014-10-26 VITALS — BP 146/93 | HR 54 | Ht 74.0 in | Wt 295.8 lb

## 2014-10-26 DIAGNOSIS — R251 Tremor, unspecified: Secondary | ICD-10-CM
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
## 2014-11-02 ENCOUNTER — Other Ambulatory Visit: Payer: Self-pay | Admitting: Family Medicine

## 2014-11-17 ENCOUNTER — Telehealth: Payer: Self-pay | Admitting: Family Medicine

## 2014-11-17 NOTE — Telephone Encounter (Signed)
Spoke to pt and rescheduled f/u appt for pt to be seen sooner and will discuss at that time

## 2014-11-17 NOTE — Telephone Encounter (Signed)
Pt called stating dr Deborra Medina prescribed him oxycodone.  He wanted to know if there was something else dr Deborra Medina could prescribed he stated he fills drunk for a couple of days after taking this  He stated he would not dare drive a car.  ibuprofen is not quite strong enough.  Is there something in between [facility]   Please call pt home number

## 2014-11-18 ENCOUNTER — Encounter: Payer: Self-pay | Admitting: *Deleted

## 2014-11-18 ENCOUNTER — Encounter: Payer: Self-pay | Admitting: Family Medicine

## 2014-11-18 ENCOUNTER — Other Ambulatory Visit: Payer: Self-pay | Admitting: Family Medicine

## 2014-11-18 ENCOUNTER — Ambulatory Visit (INDEPENDENT_AMBULATORY_CARE_PROVIDER_SITE_OTHER): Payer: 59 | Admitting: Family Medicine

## 2014-11-18 VITALS — BP 130/74 | HR 54 | Temp 97.9°F | Wt 302.5 lb

## 2014-11-18 DIAGNOSIS — M5136 Other intervertebral disc degeneration, lumbar region: Secondary | ICD-10-CM

## 2014-11-18 DIAGNOSIS — I10 Essential (primary) hypertension: Secondary | ICD-10-CM

## 2014-11-18 DIAGNOSIS — E119 Type 2 diabetes mellitus without complications: Secondary | ICD-10-CM

## 2014-11-18 LAB — COMPREHENSIVE METABOLIC PANEL
ALK PHOS: 70 U/L (ref 39–117)
ALT: 82 U/L — ABNORMAL HIGH (ref 0–53)
AST: 49 U/L — AB (ref 0–37)
Albumin: 4.4 g/dL (ref 3.5–5.2)
BUN: 21 mg/dL (ref 6–23)
CO2: 31 mEq/L (ref 19–32)
Calcium: 9.5 mg/dL (ref 8.4–10.5)
Chloride: 102 mEq/L (ref 96–112)
Creatinine, Ser: 1.16 mg/dL (ref 0.40–1.50)
GFR: 71.4 mL/min (ref >60.00)
Glucose, Bld: 106 mg/dL — ABNORMAL HIGH (ref 70–99)
POTASSIUM: 4.7 meq/L (ref 3.5–5.1)
SODIUM: 138 meq/L (ref 135–145)
Total Bilirubin: 1.3 mg/dL — ABNORMAL HIGH (ref 0.2–1.2)
Total Protein: 7.4 g/dL (ref 6.0–8.3)

## 2014-11-18 LAB — HEMOGLOBIN A1C: Hgb A1c MFr Bld: 6.5 % (ref 4.6–6.5)

## 2014-11-18 MED ORDER — TRAMADOL HCL 50 MG PO TABS: 50.0000 mg | ORAL_TABLET | Freq: Three times a day (TID) | ORAL | Status: DC | PRN

## 2014-11-18 NOTE — Assessment & Plan Note (Signed)
Deteriorated, likely due to recent steroid use.  Will give it a little more time before increasing dose of Metformin as I am concerned if we increase now, he may be prone to more episodes of hypoglycemia. Check a1c today. The patient indicates understanding of these issues and agrees with the plan.

## 2014-11-18 NOTE — Patient Instructions (Signed)
Good to see you. We will call you with your lab results.  We are adding Tramadol is for moderate pain, ok to still use Percocet for severe pain. Ok to continue Mobic.

## 2014-11-18 NOTE — Progress Notes (Signed)
Pre visit review using our clinic review tool, if applicable. No additional management support is needed unless otherwise documented below in the visit note. 

## 2014-11-18 NOTE — Assessment & Plan Note (Signed)
>  25 minutes spent in face to face time with patient, >50% spent in counselling or coordination of care I recommended tramadol to use for moderate to severe pain and then percocet for break through pain.  I did explain that this is still an opioid and can also be sedating and habit forming.  He tried it years ago prior to his back surgery and did not have any sedation with it but also felt it was ineffective.  He is willing to try it again now since his pain is much better controlled now. Call or return to clinic prn if these symptoms worsen or fail to improve as anticipated. The patient indicates understanding of these issues and agrees with the plan.

## 2014-11-18 NOTE — Assessment & Plan Note (Signed)
Well controlled 

## 2014-11-18 NOTE — Progress Notes (Signed)
 Subjective:   Patient ID: Corey Bell, male    DOB: 02/23/1966, 49 y.o.   MRN: [id number]  Corey Bell is a pleasant 49 y.o. year old male who presents with his wife, Corey Bell, to clinic today with Follow-up and Medication Management  on 11/18/2014  HPI: DM- diagnosed in August 2015.  Lab Results  Component Value Date   HGBA1C 6.3 09/07/2014   No Family h/o DM  On ACEI- Lisinopril 10 mg daily.  Urine micro neg 09/07/14 Started on Metformin 500 mg daily on 06/03/14.  Also added Zetia since LDL not at goal for diabetic (he is aware that does not have the same mortality data as statins).   Checking FSBS twice daily. FSBS have increased- ranging 121- 155.  But he did have multiple steroid injections for her shoulder within the past several weeks. Denies any further episodes of hypoglycemia.    Has been intolerant to multiple statins but he is on zetia- LDL at goal.  Eye exam-Dr. Bowen- 08/25/14- neg for diabetic retinopathy,  He finished with Diabetic teaching.  Pain management- he takes very infrequent percocet for his low back pain.  Does not like how it makes him feel "very hung over."  Takes NSAIDs but afraid to take too often.  Wants to discuss other options.   Lab Results  Component Value Date   CHOL 153 07/27/2014   HDL 26.00* 07/27/2014   LDLCALC 91 07/27/2014   LDLDIRECT 149.5 07/01/2013   TRIG 179.0* 07/27/2014   CHOLHDL 6 07/27/2014    Current Outpatient Prescriptions on File Prior to Visit  Medication Sig Dispense Refill  . Blood Glucose Monitoring Suppl (ONE TOUCH BASIC SYSTEM) W/DEVICE KIT Check blood sugar once daily and as directed. Dx. E11.9 1 each 0  . buPROPion (WELLBUTRIN XL) 300 MG 24 hr tablet TAKE ONE (1) TABLET EACH DAY 30 tablet 1  . citalopram (CELEXA) 40 MG tablet TAKE ONE (1) TABLET EACH DAY 30 tablet 1  . Erythromycin 2 % ointment Apply topically twice daily as need 25 g 0  . fexofenadine (ALLEGRA) 180 MG tablet Take 180 mg by mouth daily.      . gabapentin (NEURONTIN) 400 MG capsule TAKE ONE (1) CAPSULE FOUR (4) TIMES EACH DAY 120 capsule 3  . glucose blood test strip Check blood sugar once daily and as directed. Dx. E11.9 100 each 3  . hyoscyamine (LEVSIN SL) 0.125 MG SL tablet TAKE ONE TABLET TWICE DAILY 60 tablet 2  . ibuprofen (ADVIL,MOTRIN) 600 MG tablet Take 600 mg by mouth every 6 (six) hours as needed.    . levothyroxine (SYNTHROID, LEVOTHROID) 75 MCG tablet TAKE ONE (1) TABLET EACH DAY 30 tablet 5  . lisinopril (PRINIVIL,ZESTRIL) 10 MG tablet TAKE ONE (1) TABLET EACH DAY 30 tablet 3  . meloxicam (MOBIC) 15 MG tablet Take one to two tablets by mouth daily as needed Take one to two tablets by mouth daily as needed 60 tablet 1  . metFORMIN (GLUCOPHAGE) 500 MG tablet TAKE ONE (1) TABLET EACH DAY WITH BREAKFAST 90 tablet 1  . methocarbamol (ROBAXIN) 500 MG tablet 500 mg daily.  0  . metoprolol succinate (TOPROL-XL) 50 MG 24 hr tablet TAKE ONE (1) TABLET EACH DAY 30 tablet 5  . montelukast (SINGULAIR) 10 MG tablet Take 1 tablet (10 mg total) by mouth at bedtime. 30 tablet 3  . omeprazole (PRILOSEC) 40 MG capsule Take 1 tab 30 in before breakfast daily. 90 capsule 3  . ONE TOUCH   LANCETS MISC Check blood sugar once daily and as directed. Dx. E11.9 200 each 1  . oxyCODONE-acetaminophen (PERCOCET/ROXICET) 5-325 MG per tablet Take 1 tablet by mouth every 6 (six) hours as needed for severe pain. 30 tablet 0  . vitamin B-12 (CYANOCOBALAMIN) 1000 MCG tablet Take 1,000 mcg by mouth daily.    . Vitamin D, Ergocalciferol, (DRISDOL) 50000 UNITS CAPS capsule Take 1 capsule (50,000 Units total) by mouth every 7 (seven) days. 6 capsule 0  . ZETIA 10 MG tablet TAKE ONE (1) TABLET EACH DAY 90 tablet 1   No current facility-administered medications on file prior to visit.    Allergies  Allergen Reactions  . Augmentin [Amoxicillin-Pot Clavulanate] Diarrhea  . Demerol [Meperidine]     "goes crazy and has the strength of 10 men"  . Eggs Or  Egg-Derived Products Diarrhea and Nausea And Vomiting  . Statins Rash  . Sulfa Antibiotics Rash    Past Medical History  Diagnosis Date  . Carpal tunnel syndrome, bilateral   . H/O acute prostatitis   . Unspecified essential hypertension   . Diverticulitis of colon   . Esophageal reflux   . Mixed hyperlipidemia   . 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 Seasonal allergies   . Aphthous ulcer 10-04-12    improving  . Chronic pain syndrome   . Insomnia, unspecified   . Tympanic membrane perforation     history of  . Anxiety   . Lumbar degenerative disc disease   . Hypothyroidism   . COPD (chronic obstructive pulmonary disease)   . Sleep apnea     uses cpap  . Headache(784.0)     hx of migraines  . Depression     Past Surgical History  Procedure Laterality Date  . Lumbarsacral disc surgery      L5-S1  . Tympanostomy tube placement  1975  . Meniscus repair Left     torn meniscus  . Nesbit procedure N/A 08/22/2013    Procedure: 16 DOT PLACTATION;  Surgeon: Claybon Jabs, MD;  Location: [facility];  Service: Urology;  Laterality: N/A;  . Vasectomy Bilateral 08/22/2013    Procedure: VASECTOMY;  Surgeon: Claybon Jabs, MD;  Location: [facility];  Service: Urology;  Laterality: Bilateral;    Family History  Problem Relation Age of Onset  . Bladder Cancer Paternal Uncle   . Renal cancer Paternal Uncle     renal cell carcinoma  . Congestive Heart Failure Father   . Heart disease Father   . COPD Father   . Renal cancer Paternal Grandfather     renal call carcinoma  . Heart disease Brother     stents at 66 yo  . Parkinsonism Neg Hx     History   Social History  . Marital Status: Married    Spouse Name: [name]  . Number of Children: 0  . Years of Education: college   Occupational History  . disabled   .  Unemployed   Social History Main Topics  . Smoking status: Former Smoker -- 1.50 packs/day for 30 years    Types:  Cigarettes    Quit date: 10/10/2007  . Smokeless tobacco: Never Used  . Alcohol Use: 0.6 oz/week    1 Cans of beer per week     Comment: rarely  . Drug Use: No  . Sexual Activity: Not on file   Other Topics Concern  . Not on file   Social History  Narrative   Married.  (Corey Bell)  No children. Patient has college eduction.   In process of getting disability for low back pain.   Right handed.   Caffeine- Coffee a pot but not every day.   The PMH, PSH, Social History, Family History, Medications, and allergies have been reviewed in [facility], and have been updated if relevant.    Review of Systems See HPI No blurred vision No changes in bowel habits or urinary changes No CP or SOB No abdominal pain No n/v/d No current back pain +shoulder pain- followed by ortho No rashes    Objective:    BP 130/74 mmHg  Pulse 54  Temp(Src) 97.9 F (36.6 C) (Oral)  Wt 302 lb 8 oz (137.213 kg)  SpO2 96%   Physical Exam  Gen:  Alert, pleasant, NAD Eyes:  Conjunctiva normal Resp:  CTA bilaterally CVS:  RRR Psych:  Good eye contact,  Not anxious or depressed appearing Ext:  No edema Neuro:  CN II- XII grossly intact, normal gait      Assessment & Plan:   Diabetes mellitus, new onset - Plan: Hemoglobin A1c, Comprehensive metabolic panel  Lumbar degenerative disc disease  Essential hypertension No Follow-up on file.    

## 2014-11-24 DIAGNOSIS — Z0289 Encounter for other administrative examinations: Secondary | ICD-10-CM

## 2014-11-25 ENCOUNTER — Other Ambulatory Visit: Payer: Self-pay

## 2014-11-25 MED ORDER — GLUCOSE BLOOD VI STRP: ORAL_STRIP | Status: DC

## 2014-11-25 NOTE — Telephone Encounter (Signed)
Pt left v/m; pt has been taking blood sugar twice a day; [facility] told pt even thou on oral med pharmacy thinks ins will pay for twice a day checking of BS. Pt request new rx with twice a day instructions to [facility]. Pt request cb. Have not changed instructions until approved by Dr Deborra Medina.

## 2014-12-07 ENCOUNTER — Ambulatory Visit: Payer: BC Managed Care – PPO | Admitting: Family Medicine

## 2014-12-10 ENCOUNTER — Encounter: Payer: Self-pay | Admitting: Cardiology

## 2014-12-10 ENCOUNTER — Ambulatory Visit (INDEPENDENT_AMBULATORY_CARE_PROVIDER_SITE_OTHER): Payer: 59 | Admitting: Cardiology

## 2014-12-10 VITALS — BP 124/66 | HR 61 | Ht 74.0 in | Wt 299.0 lb

## 2014-12-10 DIAGNOSIS — R0609 Other forms of dyspnea: Secondary | ICD-10-CM

## 2014-12-10 DIAGNOSIS — E785 Hyperlipidemia, unspecified: Secondary | ICD-10-CM

## 2014-12-10 DIAGNOSIS — I10 Essential (primary) hypertension: Secondary | ICD-10-CM

## 2014-12-10 MED ORDER — ASPIRIN EC 81 MG PO TBEC: 81.0000 mg | DELAYED_RELEASE_TABLET | Freq: Every day | ORAL | Status: DC

## 2014-12-10 NOTE — Patient Instructions (Signed)
Take aspirin 81 mg daily.  Your physician wants you to follow-up in: 1 year with Dr Aundra Dubin. (March 2017). You will receive a reminder letter in the mail two months in advance. If you don't receive a letter, please call our office to schedule the follow-up appointment.

## 2014-12-11 NOTE — Progress Notes (Signed)
Patient ID: Corey Bell, male   DOB: 1965/11/01, 49 y.o.   MRN: [id number] PCP: Dr. Deborra Medina  49 yo with h/o COPD, OSA, HTN, and hyperlipidemia with strong family history of CAD presents for cardiology followup. He used to smoke 2 ppd but quit smoking in 2012.  He had a brother with an MI at 42 and another brother with PCI at 31.  Father also had CAD.  He had a normal Lexiscan Cardiolite in 9/14 and an echo showing normal EF and mild LVH.  He has had chronic exertional dyspnea.  He is short of breath walking up a flight of steps or walking 25 yards fast.  This has not changed and is thought to be due to COPD. No chest pain.  He has OSA.  He working on getting disability because of back pain.  He did not tolerate atorvastatin due to GI side effects or pravastatin due to myalgias and rash. Main limitation is low back pain, he is not able to do much exercise because of this.  Weight is up 4 lbs since last appointment.  He now has diabetes.  He is taking Zetia and tolerating it.   ECG: NSR, inferior Qs (no changes)  Labs (9/14): HDL 34.5, LDL 150 Labs (10/15): LDL 91, HDL 26  PMH: 1. COPD: Mild.   2. Hypothyroidism 3. Obesity 4. Low back pain s/p surgery 5. Depression 6. OSA 7. HTN 8. Hyperlipidemia: Myalgias and rash with simvastatin.  GI side effects with atorvastatin. Myalgias and rash with pravastatin.  9. Dyspnea: Lexiscan Cardiolite (9/14) with EF 56%, no ischemia or infarction.  Echo (9/14) with EF 55-60%, mild LVH. 10. Type II diabetes   SH: Married, lives in [place], disabled (back), used to smoke 2 ppd but quit in 2012.    FH: Brother with MI at 42, other brother with PCI at 37, father with CAD.   ROS: All systems reviewed and negative except as per HPI.   Current Outpatient Prescriptions  Medication Sig Dispense Refill  . Blood Glucose Monitoring Suppl (ONE TOUCH BASIC SYSTEM) W/DEVICE KIT Check blood sugar once daily and as directed. Dx. E11.9 1 each 0  . buPROPion  (WELLBUTRIN XL) 300 MG 24 hr tablet TAKE ONE (1) TABLET EACH DAY 30 tablet 1  . citalopram (CELEXA) 40 MG tablet TAKE ONE (1) TABLET EACH DAY 30 tablet 1  . Erythromycin 2 % ointment Apply topically twice daily as need 25 g 0  . fexofenadine (ALLEGRA) 180 MG tablet Take 180 mg by mouth daily.    Marland Kitchen gabapentin (NEURONTIN) 400 MG capsule TAKE ONE (1) CAPSULE FOUR (4) TIMES EACH DAY 120 capsule 3  . glucose blood test strip Check blood sugar twice daily and as directed. Dx. E11.9 100 each 3  . hyoscyamine (LEVSIN SL) 0.125 MG SL tablet TAKE ONE TABLET TWICE DAILY 60 tablet 2  . ibuprofen (ADVIL,MOTRIN) 600 MG tablet Take 600 mg by mouth every 6 (six) hours as needed.    Marland Kitchen levothyroxine (SYNTHROID, LEVOTHROID) 75 MCG tablet TAKE ONE (1) TABLET EACH DAY 30 tablet 5  . lisinopril (PRINIVIL,ZESTRIL) 10 MG tablet TAKE ONE (1) TABLET EACH DAY 30 tablet 3  . meloxicam (MOBIC) 15 MG tablet Take one to two tablets by mouth daily as needed Take one to two tablets by mouth daily as needed 60 tablet 1  . metFORMIN (GLUCOPHAGE) 500 MG tablet TAKE ONE (1) TABLET EACH DAY WITH BREAKFAST 90 tablet 1  . methocarbamol (ROBAXIN) 500 MG tablet  500 mg daily.  0  . metoprolol succinate (TOPROL-XL) 50 MG 24 hr tablet TAKE ONE (1) TABLET EACH DAY 30 tablet 5  . montelukast (SINGULAIR) 10 MG tablet Take 1 tablet (10 mg total) by mouth at bedtime. 30 tablet 3  . omeprazole (PRILOSEC) 40 MG capsule Take 1 tab 30 in before breakfast daily. 90 capsule 3  . ONE TOUCH LANCETS MISC Check blood sugar once daily and as directed. Dx. E11.9 200 each 1  . oxyCODONE-acetaminophen (PERCOCET/ROXICET) 5-325 MG per tablet Take 1 tablet by mouth every 6 (six) hours as needed for severe pain. 30 tablet 0  . traMADol (ULTRAM) 50 MG tablet Take 1 tablet (50 mg total) by mouth every 8 (eight) hours as needed for moderate pain or severe pain. 30 tablet 0  . vitamin B-12 (CYANOCOBALAMIN) 1000 MCG tablet Take 1,000 mcg by mouth daily.    Marland Kitchen ZETIA 10  MG tablet TAKE ONE (1) TABLET EACH DAY 90 tablet 1  . aspirin EC 81 MG tablet Take 1 tablet (81 mg total) by mouth daily.     No current facility-administered medications for this visit.    BP 124/66 mmHg  Pulse 61  Ht 6' 2"  (1.88 m)  Wt 299 lb (135.626 kg)  BMI 38.37 kg/m2 General: NAD, obese Neck: thick, no JVD, no thyromegaly or thyroid nodule.  Lungs: Clear to auscultation bilaterally with normal respiratory effort. CV: Nondisplaced PMI.  Heart regular S1/S2, no S3/S4, no murmur.  No peripheral edema.  No carotid bruit.  Normal pedal pulses.  Abdomen: Soft, nontender, no hepatosplenomegaly, no distention.  Skin: Intact without lesions or rashes.  Neurologic: Alert and oriented x 3.  Psych: Normal affect. Extremities: No clubbing or cyanosis.   Assessment/Plan: 1. Exertional dyspnea: Stable.  Echo and Cardiolite unremarkable in 9/14.  Suspect COPD and obesity/deconditioning both play major roles.  2. HTN: BP appears controlled on current regimen.  3. Hyperlipidemia: He has not tolerate simvastatin, pravastatin, or atorvastatin.  He is taking Zetia with no problems and lipids have come down.  4. CAD risk: Patient is at significant risk for CAD with HTN, DM, hyperlipidemia, and strong family history.  He will start ASA 81 daily.   5. Obesity: Exercise limited by back pain.  I suggested that he try walking/swimming in a pool or a recumbent bike.  If he continues to be unable to lose weight, would probably be a good candidate for bariatric surgery.   Loralie Champagne 12/11/2014

## 2014-12-19 ENCOUNTER — Other Ambulatory Visit: Payer: Self-pay | Admitting: Family Medicine

## 2014-12-28 ENCOUNTER — Encounter: Payer: Self-pay | Admitting: Family Medicine

## 2014-12-28 ENCOUNTER — Ambulatory Visit (INDEPENDENT_AMBULATORY_CARE_PROVIDER_SITE_OTHER): Payer: 59 | Admitting: Family Medicine

## 2014-12-28 ENCOUNTER — Telehealth: Payer: Self-pay

## 2014-12-28 VITALS — BP 136/78 | HR 57 | Temp 98.1°F | Wt 302.0 lb

## 2014-12-28 DIAGNOSIS — M5136 Other intervertebral disc degeneration, lumbar region: Secondary | ICD-10-CM

## 2014-12-28 DIAGNOSIS — E119 Type 2 diabetes mellitus without complications: Secondary | ICD-10-CM

## 2014-12-28 DIAGNOSIS — F419 Anxiety disorder, unspecified: Secondary | ICD-10-CM

## 2014-12-28 DIAGNOSIS — R0609 Other forms of dyspnea: Secondary | ICD-10-CM

## 2014-12-28 DIAGNOSIS — M51369 Other intervertebral disc degeneration, lumbar region without mention of lumbar back pain or lower extremity pain: Secondary | ICD-10-CM

## 2014-12-28 MED ORDER — ERYTHROMYCIN 2 % EX OINT: TOPICAL_OINTMENT | CUTANEOUS | Status: DC

## 2014-12-28 NOTE — Assessment & Plan Note (Signed)
Under good control.  No changes made today. 

## 2014-12-28 NOTE — Telephone Encounter (Signed)
Clyde from [place] left v/m; pharmacy and wholesaler is out of erythromycin ointment; Wynonia Lawman wants to know if could change to erythromycin tabs or Wynonia Lawman does have gentamycin sulfate ointment 0.1. Clyde request cb.

## 2014-12-28 NOTE — Patient Instructions (Signed)
Good to see you. We will call you with your referral to see a nutritionist.

## 2014-12-28 NOTE — Assessment & Plan Note (Signed)
Pain controlled with prn NSAIDs and tramadol. No changes made today.

## 2014-12-28 NOTE — Assessment & Plan Note (Signed)
Discussed tx options. Advised against weight loss rxs given his comorbidities. He is willing to see a nutritionist again, to help form weight loss diet plan. Referral placed.

## 2014-12-28 NOTE — Telephone Encounter (Signed)
Spoke to pharmacy and advised per Dr Deborra Medina; verbally expressed understanding.

## 2014-12-28 NOTE — Progress Notes (Signed)
Subjective:   Patient ID: Corey Bell, male    DOB: 1965/12/06, 49 y.o.   MRN: [id number]  Corey Bell is a pleasant 49 y.o. year old male who presents with his wife, Corey Bell, to clinic today with Follow-up  on 12/28/2014  HPI: DM- diagnosed in August 2015.   Lab Results  Component Value Date   HGBA1C 6.5 11/18/2014   No Family h/o DM  On ACEI- Lisinopril 10 mg daily.  Urine micro neg 09/07/14 Started on Metformin 500 mg daily on 06/03/14.  Also added Zetia since LDL not at goal for diabetic (he is aware that does not have the same mortality data as statins).   Checking FSBS twice daily. FSBS have increased- ranging 121- 130s.  Denies any episodes of hypoglycemia.    Has been intolerant to multiple statins but he is on zetia- LDL at goal. Lab Results  Component Value Date   CHOL 153 07/27/2014   HDL 26.00* 07/27/2014   LDLCALC 91 07/27/2014   LDLDIRECT 149.5 07/01/2013   TRIG 179.0* 07/27/2014   CHOLHDL 6 07/27/2014   He does need to lose weight and would like to discuss options today.  His cardiologist is also urging him to consider weight loss surgery but he is not interested in pursuing surgical options.  Eye exam-Dr. Valetta Close- 08/25/14- neg for diabetic retinopathy.  Pain management- he takes very infrequent Tramadol.  He feels this makes him less hung over but is adequate for pain control.       Lab Results  Component Value Date   CHOL 153 07/27/2014   HDL 26.00* 07/27/2014   LDLCALC 91 07/27/2014   LDLDIRECT 149.5 07/01/2013   TRIG 179.0* 07/27/2014   CHOLHDL 6 07/27/2014    Current Outpatient Prescriptions on File Prior to Visit  Medication Sig Dispense Refill  . aspirin EC 81 MG tablet Take 1 tablet (81 mg total) by mouth daily.    . Blood Glucose Monitoring Suppl (ONE TOUCH BASIC SYSTEM) W/DEVICE KIT Check blood sugar once daily and as directed. Dx. E11.9 1 each 0  . buPROPion (WELLBUTRIN XL) 300 MG 24 hr tablet TAKE ONE (1) TABLET EACH DAY 30 tablet 5  .  citalopram (CELEXA) 40 MG tablet TAKE ONE (1) TABLET EACH DAY 30 tablet 5  . fexofenadine (ALLEGRA) 180 MG tablet Take 180 mg by mouth daily.    Marland Kitchen gabapentin (NEURONTIN) 400 MG capsule TAKE ONE (1) CAPSULE FOUR (4) TIMES EACH DAY 120 capsule 3  . glucose blood test strip Check blood sugar twice daily and as directed. Dx. E11.9 100 each 3  . hyoscyamine (LEVSIN SL) 0.125 MG SL tablet TAKE ONE TABLET TWICE DAILY 60 tablet 2  . ibuprofen (ADVIL,MOTRIN) 600 MG tablet Take 600 mg by mouth every 6 (six) hours as needed.    Marland Kitchen levothyroxine (SYNTHROID, LEVOTHROID) 75 MCG tablet TAKE ONE (1) TABLET EACH DAY 30 tablet 5  . lisinopril (PRINIVIL,ZESTRIL) 10 MG tablet TAKE ONE (1) TABLET EACH DAY 30 tablet 3  . meloxicam (MOBIC) 15 MG tablet Take one to two tablets by mouth daily as needed Take one to two tablets by mouth daily as needed 60 tablet 1  . metFORMIN (GLUCOPHAGE) 500 MG tablet TAKE ONE (1) TABLET EACH DAY WITH BREAKFAST 90 tablet 1  . methocarbamol (ROBAXIN) 500 MG tablet 500 mg daily.  0  . metoprolol succinate (TOPROL-XL) 50 MG 24 hr tablet TAKE ONE (1) TABLET EACH DAY 30 tablet 5  . montelukast (SINGULAIR) 10  MG tablet Take 1 tablet (10 mg total) by mouth at bedtime. 30 tablet 3  . omeprazole (PRILOSEC) 40 MG capsule Take 1 tab 30 in before breakfast daily. 90 capsule 3  . ONE TOUCH LANCETS MISC Check blood sugar once daily and as directed. Dx. E11.9 200 each 1  . oxyCODONE-acetaminophen (PERCOCET/ROXICET) 5-325 MG per tablet Take 1 tablet by mouth every 6 (six) hours as needed for severe pain. 30 tablet 0  . traMADol (ULTRAM) 50 MG tablet Take 1 tablet (50 mg total) by mouth every 8 (eight) hours as needed for moderate pain or severe pain. 30 tablet 0  . vitamin B-12 (CYANOCOBALAMIN) 1000 MCG tablet Take 1,000 mcg by mouth daily.    Marland Kitchen ZETIA 10 MG tablet TAKE ONE (1) TABLET EACH DAY 90 tablet 1   No current facility-administered medications on file prior to visit.    Allergies  Allergen  Reactions  . Augmentin [Amoxicillin-Pot Clavulanate] Diarrhea  . Demerol [Meperidine]     "goes crazy and has the strength of 10 men"  . Eggs Or Egg-Derived Products Diarrhea and Nausea And Vomiting  . Statins Rash  . Sulfa Antibiotics Rash  . Sulfacetamide Sodium Rash    Past Medical History  Diagnosis Date  . Carpal tunnel syndrome, bilateral   . H/O acute prostatitis   . Unspecified essential hypertension   . Diverticulitis of colon   . Esophageal reflux   . Mixed hyperlipidemia   . 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 Seasonal allergies   . Aphthous ulcer 10-04-12    improving  . Chronic pain syndrome   . Insomnia, unspecified   . Tympanic membrane perforation     history of  . Anxiety   . Lumbar degenerative disc disease   . Hypothyroidism   . COPD (chronic obstructive pulmonary disease)   . Sleep apnea     uses cpap  . Headache(784.0)     hx of migraines  . Depression     Past Surgical History  Procedure Laterality Date  . Lumbarsacral disc surgery      L5-S1  . Tympanostomy tube placement  1975  . Meniscus repair Left     torn meniscus  . Nesbit procedure N/A 08/22/2013    Procedure: 16 DOT PLACTATION;  Surgeon: Claybon Jabs, MD;  Location: [facility];  Service: Urology;  Laterality: N/A;  . Vasectomy Bilateral 08/22/2013    Procedure: VASECTOMY;  Surgeon: Claybon Jabs, MD;  Location: [facility];  Service: Urology;  Laterality: Bilateral;    Family History  Problem Relation Age of Onset  . Bladder Cancer Paternal Uncle   . Renal cancer Paternal Uncle     renal cell carcinoma  . Congestive Heart Failure Father   . Heart disease Father   . COPD Father   . Renal cancer Paternal Grandfather     renal call carcinoma  . Heart disease Brother     stents at 43 yo  . Parkinsonism Neg Hx     History   Social History  . Marital Status: Married    Spouse Name: Corey Bell  . Number of Children: 0  . Years of  Education: college   Occupational History  . disabled   .  Unemployed   Social History Main Topics  . Smoking status: Former Smoker -- 1.50 packs/day for 30 years    Types: Cigarettes    Quit date: 10/10/2007  . Smokeless tobacco: Never Used  .  Alcohol Use: 0.6 oz/week    1 Cans of beer per week     Comment: rarely  . Drug Use: No  . Sexual Activity: Not on file   Other Topics Concern  . Not on file   Social History Narrative   Married.  Corey Bell)  No children. Patient has Best boy.   In process of getting disability for low back pain.   Right handed.   Caffeine- Coffee a pot but not every day.   The PMH, PSH, Social History, Family History, Medications, and allergies have been reviewed in [facility], and have been updated if relevant.    Review of Systems See HPI No blurred vision No changes in bowel habits or urinary changes No CP or SOB No abdominal pain No n/v/d No current back pain No rashes    Objective:    BP 136/78 mmHg  Pulse 57  Temp(Src) 98.1 F (36.7 C) (Oral)  Wt 302 lb (136.986 kg)  SpO2 94%   Physical Exam  Gen:  Alert, pleasant, NAD Eyes:  Conjunctiva normal Resp:  CTA bilaterally CVS:  RRR Abd:  Soft, NT, pos BS Psych:  Good eye contact,  Not anxious or depressed appearing Ext:  No edema Neuro:  CN II- XII grossly intact, normal gait Skin:  No lesions      Assessment & Plan:   Anxiety  Morbid obesity - Plan: Amb ref to Medical Nutrition Therapy-MNT  Exertional dyspnea  Diabetes mellitus, new onset No Follow-up on file.

## 2014-12-28 NOTE — Progress Notes (Signed)
Pre visit review using our clinic review tool, if applicable. No additional management support is needed unless otherwise documented below in the visit note. 

## 2014-12-28 NOTE — Assessment & Plan Note (Signed)
Well controlled. Agreed that he needs to lose weight. See below. No changes to rxs made today.

## 2014-12-28 NOTE — Telephone Encounter (Signed)
Please ask pharmacist to call pt to see if he is ok with change.

## 2015-01-20 ENCOUNTER — Ambulatory Visit
Admit: 2015-01-20 | Disposition: A | Discharge status: Home / Self Care | Payer: Self-pay | Attending: Family Medicine | Admitting: Family Medicine

## 2015-01-27 ENCOUNTER — Other Ambulatory Visit: Payer: Self-pay | Admitting: Family Medicine

## 2015-02-08 ENCOUNTER — Other Ambulatory Visit: Payer: Self-pay | Admitting: Family Medicine

## 2015-02-08 ENCOUNTER — Telehealth: Payer: Self-pay | Admitting: *Deleted

## 2015-02-08 MED ORDER — ERYTHROMYCIN 2 % EX OINT
TOPICAL_OINTMENT | CUTANEOUS | Status: DC
Start: 2015-02-08 — End: 2015-03-10

## 2015-02-08 NOTE — Telephone Encounter (Signed)
Called and rescheduled patient to 02/25/15 at 7:15am from 02/26/15 due to Dr. Jaynee Eagles no longer being in the office on Friday's. Pt aware and okay with this.

## 2015-02-09 ENCOUNTER — Encounter: Payer: Self-pay | Admitting: Emergency Medicine

## 2015-02-09 ENCOUNTER — Emergency Department
Admission: EM | Admit: 2015-02-09 | Discharge: 2015-02-09 | Disposition: A | Discharge status: Home / Self Care | Payer: 59 | Attending: Emergency Medicine | Admitting: Emergency Medicine

## 2015-02-09 ENCOUNTER — Emergency Department: Payer: 59

## 2015-02-09 ENCOUNTER — Other Ambulatory Visit: Payer: Self-pay

## 2015-02-09 DIAGNOSIS — R0789 Other chest pain: Secondary | ICD-10-CM

## 2015-02-09 DIAGNOSIS — Z880 Allergy status to penicillin: Secondary | ICD-10-CM | POA: Insufficient documentation

## 2015-02-09 DIAGNOSIS — E119 Type 2 diabetes mellitus without complications: Secondary | ICD-10-CM | POA: Diagnosis not present

## 2015-02-09 DIAGNOSIS — Z87891 Personal history of nicotine dependence: Secondary | ICD-10-CM | POA: Insufficient documentation

## 2015-02-09 DIAGNOSIS — I10 Essential (primary) hypertension: Secondary | ICD-10-CM | POA: Diagnosis not present

## 2015-02-09 DIAGNOSIS — Z79899 Other long term (current) drug therapy: Secondary | ICD-10-CM | POA: Diagnosis not present

## 2015-02-09 DIAGNOSIS — R079 Chest pain, unspecified: Secondary | ICD-10-CM | POA: Insufficient documentation

## 2015-02-09 DIAGNOSIS — Z7982 Long term (current) use of aspirin: Secondary | ICD-10-CM | POA: Diagnosis not present

## 2015-02-09 LAB — BASIC METABOLIC PANEL
ANION GAP: 8 (ref 5–15)
BUN: 19 mg/dL (ref 6–20)
CALCIUM: 9.2 mg/dL (ref 8.9–10.3)
CO2: 28 mmol/L (ref 22–32)
CREATININE: 1.3 mg/dL — AB (ref 0.61–1.24)
Chloride: 102 mmol/L (ref 101–111)
Glucose, Bld: 65 mg/dL (ref 65–99)
Potassium: 4.5 mmol/L (ref 3.5–5.1)
SODIUM: 138 mmol/L (ref 135–145)

## 2015-02-09 LAB — CBC
HCT: 47.1 % (ref 39.0–52.0)
Hemoglobin: 15.8 g/dL (ref 13.0–17.0)
MCH: 31.1 pg (ref 26.0–34.0)
MCHC: 33.5 g/dL (ref 30.0–36.0)
MCV: 92.9 fL (ref 78.0–100.0)
PLATELETS: 210 10*3/uL (ref 150–400)
RBC: 5.07 MIL/uL (ref 4.22–5.81)
RDW: 13.2 % (ref 11.5–15.5)
WBC: 5.5 10*3/uL (ref 4.0–10.5)

## 2015-02-09 LAB — TROPONIN I: Troponin I: <0.03 ng/mL (ref <0.031)

## 2015-02-09 MED ORDER — ASPIRIN 81 MG PO CHEW
324.0000 mg | CHEWABLE_TABLET | Freq: Once | ORAL | Status: AC
  Administered 2015-02-09: 324 mg via ORAL

## 2015-02-09 MED ORDER — ASPIRIN 81 MG PO CHEW
CHEWABLE_TABLET | ORAL | Status: AC
  Filled 2015-02-09: qty 4

## 2015-02-09 NOTE — ED Provider Notes (Signed)
[facility] Emergency Department Provider Note   Time seen: 1520  I have reviewed the triage vital signs and the nursing notes.   HISTORY  Chief Complaint Chest Pain    HPI Corey Bell is a 49 y.o. male who presents to the ER for chest pain describes as burning started at 12:30 today radiated down his left arm no nausea no shortness of breath no diaphoresis. Patient has not had this happen before he does have history of reflux. Pain is gone now it was significantly earlier also felt some pressure in his chest. States he feels well wants to go home currently     Past Medical History  Diagnosis Date  . Carpal tunnel syndrome, bilateral   . H/O acute prostatitis   . Unspecified essential hypertension   . Diverticulitis of colon   . Esophageal reflux   . Mixed hyperlipidemia   . 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 Seasonal allergies   . Aphthous ulcer 10-04-12    improving  . Chronic pain syndrome   . Insomnia, unspecified   . Tympanic membrane perforation     history of  . Anxiety   . Lumbar degenerative disc disease   . Hypothyroidism   . COPD (chronic obstructive pulmonary disease)   . Sleep apnea     uses cpap  . Headache(784.0)     hx of migraines  . Depression     Patient Active Problem List   Diagnosis Date Noted  . Morbid obesity 12/28/2014  . Diabetes mellitus, new onset 06/03/2014  . HLD (hyperlipidemia) 06/03/2014  . Other malaise and fatigue 05/25/2014  . Abdominal pain, left lower quadrant 05/14/2014  . Bowel habit changes 05/14/2014  . Abnormal LFTs 05/14/2014  . Elevated LFTs 03/20/2014  . Otitis, externa, infective 03/20/2014  . Memory loss 01/08/2014  . Headache 01/08/2014  . Dyspnea 12/12/2013  . Right shoulder pain 10/28/2013  . OSA (obstructive sleep apnea) 07/01/2013  . Essential hypertension 05/29/2013  . Hyperlipidemia 05/29/2013  . Exertional dyspnea 05/29/2013  . Family history of heart disease  05/28/2013  . Depression 05/28/2013  . Tremor 05/28/2013  . Anxiety   . Lumbar degenerative disc disease   . Chronic pain syndrome   . Hypothyroidism   . 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 Lumbarsacral disc surgery      L5-S1  . Tympanostomy tube placement  1975  . Meniscus repair Left     torn meniscus  . Nesbit procedure N/A 08/22/2013    Procedure: 16 DOT PLACTATION;  Surgeon: Claybon Jabs, MD;  Location: [facility];  Service: Urology;  Laterality: N/A;  . Vasectomy Bilateral 08/22/2013    Procedure: VASECTOMY;  Surgeon: Claybon Jabs, MD;  Location: [facility];  Service: Urology;  Laterality: Bilateral;    Current Outpatient Rx  Name  Route  Sig  Dispense  Refill  . aspirin EC 81 MG tablet   Oral   Take 1 tablet (81 mg total) by mouth daily.         . Blood Glucose Monitoring Suppl (ONE TOUCH BASIC SYSTEM) W/DEVICE KIT      Check blood sugar once daily and as directed. Dx. E11.9   1 each   0   . buPROPion (WELLBUTRIN XL) 300 MG 24 hr tablet      TAKE ONE (1) TABLET EACH DAY   30 tablet  5   . citalopram (CELEXA) 40 MG tablet      TAKE ONE (1) TABLET EACH DAY   30 tablet   5   . Erythromycin 2 % ointment      Apply topically twice daily as need   25 g   0   . fexofenadine (ALLEGRA) 180 MG tablet   Oral   Take 180 mg by mouth daily.         Marland Kitchen gabapentin (NEURONTIN) 400 MG capsule      TAKE ONE (1) CAPSULE FOUR (4) TIMES EACH DAY   120 capsule   3   . glucose blood test strip      Check blood sugar twice daily and as directed. Dx. E11.9   100 each   3   . hyoscyamine (LEVSIN SL) 0.125 MG SL tablet      TAKE ONE TABLET TWICE DAILY   60 tablet   5   . ibuprofen (ADVIL,MOTRIN) 600 MG tablet   Oral   Take 600 mg by mouth every 6 (six) hours as needed.         Marland Kitchen levothyroxine (SYNTHROID, LEVOTHROID) 75 MCG tablet      TAKE ONE (1) TABLET EACH DAY   30  tablet   5   . lisinopril (PRINIVIL,ZESTRIL) 10 MG tablet      TAKE ONE (1) TABLET EACH DAY   30 tablet   3   . meloxicam (MOBIC) 15 MG tablet      Take one to two tablets by mouth daily as needed Take one to two tablets by mouth daily as needed   60 tablet   1   . metFORMIN (GLUCOPHAGE) 500 MG tablet      TAKE ONE (1) TABLET EACH DAY WITH BREAKFAST   90 tablet   1   . methocarbamol (ROBAXIN) 500 MG tablet      500 mg daily.      0   . metoprolol succinate (TOPROL-XL) 50 MG 24 hr tablet      TAKE ONE (1) TABLET EACH DAY   30 tablet   5   . montelukast (SINGULAIR) 10 MG tablet   Oral   Take 1 tablet (10 mg total) by mouth at bedtime.   30 tablet   3   . omeprazole (PRILOSEC) 40 MG capsule      Take 1 tab 30 in before breakfast daily.   90 capsule   3   . ONE TOUCH LANCETS MISC      Check blood sugar once daily and as directed. Dx. E11.9   200 each   1   . oxyCODONE-acetaminophen (PERCOCET/ROXICET) 5-325 MG per tablet   Oral   Take 1 tablet by mouth every 6 (six) hours as needed for severe pain.   30 tablet   0   . traMADol (ULTRAM) 50 MG tablet   Oral   Take 1 tablet (50 mg total) by mouth every 8 (eight) hours as needed for moderate pain or severe pain.   30 tablet   0   . vitamin B-12 (CYANOCOBALAMIN) 1000 MCG tablet   Oral   Take 1,000 mcg by mouth daily.         Marland Kitchen ZETIA 10 MG tablet      TAKE ONE (1) TABLET EACH DAY   90 tablet   1     Allergies Augmentin; Demerol; Eggs or egg-derived products; Statins; Sulfa antibiotics; and Sulfacetamide sodium  Family History  Problem  Relation Age of Onset  . Bladder Cancer Paternal Uncle   . Renal cancer Paternal Uncle     renal cell carcinoma  . Congestive Heart Failure Father   . Heart disease Father   . COPD Father   . Renal cancer Paternal Grandfather     renal call carcinoma  . Heart disease Brother     stents at 79 yo  . Parkinsonism Neg Hx     Social History History   Substance Use Topics  . Smoking status: Former Smoker -- 1.50 packs/day for 30 years    Types: Cigarettes    Quit date: 10/10/2007  . Smokeless tobacco: Never Used  . Alcohol Use: 0.6 oz/week    1 Cans of beer per week     Comment: rarely    Review of Systems Constitutional: Negative for fever. Eyes: Negative for visual changes. ENT: Negative for sore throat. Cardiovascular: Negative for chest pain. Respiratory: Negative for shortness of breath. Gastrointestinal: Negative for abdominal pain, vomiting and diarrhea. Genitourinary: Negative for dysuria. Musculoskeletal: Negative for back pain. Skin: Negative for rash. Neurological: Negative for headaches, focal weakness or numbness.  10-point ROS otherwise negative.  ____________________________________________   PHYSICAL EXAM:  VITAL SIGNS: ED Triage Vitals  Enc Vitals Group     BP 02/09/15 1304 133/68 mmHg     Pulse Rate 02/09/15 1304 59     Resp --      Temp 02/09/15 1304 97.6 F (36.4 C)     Temp Source 02/09/15 1304 Oral     SpO2 02/09/15 1304 95 %     Weight 02/09/15 1304 300 lb (136.079 kg)     Height 02/09/15 1304 6' 2"  (1.88 m)     Head Cir --      Peak Flow --      Pain Score --      Pain Loc --      Pain Edu? --      Excl. in [place]? --     Constitutional: Alert and oriented. Well appearing and in no distress. Eyes: Conjunctivae are normal. PERRL. Normal extraocular movements. ENT   Head: Normocephalic and atraumatic.   Nose: No congestion/rhinnorhea.   Mouth/Throat: Mucous membranes are moist.   Neck: No stridor. Hematological/Lymphatic/Immunilogical: No cervical lymphadenopathy. Cardiovascular: Normal rate, regular rhythm. Normal and symmetric distal pulses are present in all extremities. No murmurs, rubs, or gallops. Respiratory: Normal respiratory effort without tachypnea nor retractions. Breath sounds are clear and equal bilaterally. No wheezes/rales/rhonchi. Gastrointestinal: Soft and  nontender. No distention. No abdominal bruits. There is no CVA tenderness.  Musculoskeletal: Nontender with normal range of motion in all extremities. No joint effusions.  No lower extremity tenderness nor edema. Neurologic:  Normal speech and language. No gross focal neurologic deficits are appreciated. Speech is normal. No gait instability. Skin:  Skin is warm, dry and intact. No rash noted. Psychiatric: Mood and affect are normal. Speech and behavior are normal. Patient exhibits appropriate insight and judgment.  ____________________________________________    LABS (pertinent positives/negatives)  Troponin negative 2  ____________________________________________  EKG: Sinus bradycardia rate 59 normal axis normal intervals no evidence of hypertrophy or acute infarction. Nonspecific ST and T-wave changes     RADIOLOGY  Normal chest x-ray  ____________________________________________    IMPRESSION / ASSESSMENT AND PLAN / ED COURSE  Pertinent labs & imaging results that were available during my care of the patient were reviewed by me and considered in my medical decision making (see chart for details).  Chest  pain  We'll check 2 sets of cardiac proteins and reevaluate.   Final impression plan is chest pain plan is to follow up with her primary care doctor or cardiologist wanted to days for reevaluation. He is in no acute distress pain is 0 currently. Heart score is 3  Earleen Newport, MD   Earleen Newport, MD 02/09/15 [phone]

## 2015-02-09 NOTE — ED Notes (Signed)
Lab notified about delay in lab results

## 2015-02-09 NOTE — ED Notes (Signed)
Pt reports that he feels like and elephant is sitting on his chest. He is has had some SOB and nausea with it. His father had bypass and his brothers have heart problems. His cardiologist is Dr. Emiliano Dyer at [facility].

## 2015-02-09 NOTE — Discharge Instructions (Signed)

## 2015-02-10 ENCOUNTER — Encounter: Payer: Self-pay | Admitting: Dietician

## 2015-02-10 ENCOUNTER — Encounter: Payer: Medicare Other | Attending: Family Medicine | Admitting: Dietician

## 2015-02-10 VITALS — Ht 74.0 in | Wt 301.2 lb

## 2015-02-10 DIAGNOSIS — E119 Type 2 diabetes mellitus without complications: Secondary | ICD-10-CM | POA: Insufficient documentation

## 2015-02-10 NOTE — Progress Notes (Signed)
Medical Nutrition Therapy: Follow-up visit; Visit start time: 0900  end time: 0930  Assessment:  Diagnosis: obesity, Type 2 Dm Past medical history: HTN, hyperlipidemia Psychosocial issues/ stress concerns: none per pt Preferred learning method:  . No preference indicated  Current weight: 301.2  Height: 38ft 2in  Progress and evaluation: pt reports significant reduction in snacking, fewer nuts, some popcorn. Using smaller food containers. Increased physical activity.   Some progress Physical activity: yardwork daily, weather permitting.   Dietary Intake:  Usual eating pattern includes 59meals and 2 snacks per day. Dining out frequency: 2-3 meals per week.  Breakfast: yogurt and banana or raisin bran; coffee, black - several cups Snack: usually none Lunch: sandwich or beans or salad Snack: apple or nuts Supper: meat and vegetables, adds salad if still hungry Snack: popcorn Beverages: tea with Splenda at supper; otherwise water  Nutrition Care Education: Basic nutrition:appropriate nutrient balance  Weight control: benefits of weight control,behavioral changes for weight loss Advanced nutrition:  food label reading Diabetes:   appropriate carb intake and balance Other lifestyle changes:  Exercise benefits and motivation to start  Nutritional Diagnosis:  [place]-3.3 Overweight/obesity As related to low physical activity.  As evidenced by infrequent activity, difficulty meeting exercise goal.  Intervention: Reveiwed patient's progress since initial MNT visit on 01/20/15; wt stable despite diet changes to decrease caloric intake. Discussed benefits of tracking food intake as well as physical activity.    Discussed options for exercise that would not increase back pain.   Education Materials given:  Marland Kitchen Goals/ instructions  Goals:  Exercise on stationary bike 1-2 times a day, starting with 1-2 minutes and increasing as able.   Keep sugar low at breakfast by eating a small bowl of cereal, and  adding 2-4 Tbsp. nuts for protein.   Continue with previous goals -- using smaller food containers, begin tracking food intake.   Learner/ who was taught:  . Patient    Level of understanding: Marland Kitchen Verbalizes/ demonstrates competency  Demonstrated degree of understanding via:   Teach back Learning barriers: . None  Willingness to learn/ readiness for change: . Eager, change in progress   Monitoring and Evaluation:  Dietary intake, exercise, and body weight 03/10/15

## 2015-02-16 ENCOUNTER — Ambulatory Visit (INDEPENDENT_AMBULATORY_CARE_PROVIDER_SITE_OTHER): Payer: Medicare Other | Admitting: Primary Care

## 2015-02-16 ENCOUNTER — Encounter: Payer: Self-pay | Admitting: Primary Care

## 2015-02-16 ENCOUNTER — Telehealth: Payer: Self-pay | Admitting: Family Medicine

## 2015-02-16 VITALS — BP 142/62 | HR 57 | Temp 97.7°F | Ht 74.0 in | Wt 304.4 lb

## 2015-02-16 DIAGNOSIS — R531 Weakness: Secondary | ICD-10-CM

## 2015-02-16 LAB — POCT URINALYSIS DIPSTICK
Bilirubin, UA: NEGATIVE
GLUCOSE UA: NEGATIVE
KETONES UA: NEGATIVE
Leukocytes, UA: NEGATIVE
Nitrite, UA: NEGATIVE
Protein, UA: NEGATIVE
RBC UA: NEGATIVE
SPEC GRAV UA: 1.015
Urobilinogen, UA: 4
pH, UA: 6

## 2015-02-16 LAB — COMPREHENSIVE METABOLIC PANEL
ALBUMIN: 4 g/dL (ref 3.5–5.2)
ALT: 57 U/L — ABNORMAL HIGH (ref 0–53)
AST: 39 U/L — ABNORMAL HIGH (ref 0–37)
Alkaline Phosphatase: 63 U/L (ref 39–117)
BUN: 13 mg/dL (ref 6–23)
CO2: 30 meq/L (ref 19–32)
Calcium: 9.2 mg/dL (ref 8.4–10.5)
Chloride: 102 mEq/L (ref 96–112)
Creatinine, Ser: 1.12 mg/dL (ref 0.40–1.50)
GFR: 74.27 mL/min (ref >60.00)
GLUCOSE: 129 mg/dL — AB (ref 70–99)
POTASSIUM: 4.2 meq/L (ref 3.5–5.1)
SODIUM: 137 meq/L (ref 135–145)
TOTAL PROTEIN: 6.9 g/dL (ref 6.0–8.3)
Total Bilirubin: 1.2 mg/dL (ref 0.2–1.2)

## 2015-02-16 LAB — CBC WITH DIFFERENTIAL/PLATELET
Basophils Absolute: 0 10*3/uL (ref 0.0–0.1)
Basophils Relative: 0.6 % (ref 0.0–3.0)
EOS ABS: 0.2 10*3/uL (ref 0.0–0.7)
Eosinophils Relative: 2.4 % (ref 0.0–5.0)
HCT: 43 % (ref 39.0–52.0)
Hemoglobin: 14.8 g/dL (ref 13.0–17.0)
Lymphocytes Relative: 22.5 % (ref 12.0–46.0)
Lymphs Abs: 1.6 10*3/uL (ref 0.7–4.0)
MCHC: 34.4 g/dL (ref 30.0–36.0)
MCV: 91.3 fl (ref 78.0–100.0)
Monocytes Absolute: 0.4 10*3/uL (ref 0.1–1.0)
Monocytes Relative: 6.4 % (ref 3.0–12.0)
NEUTROS PCT: 68.1 % (ref 43.0–77.0)
Neutro Abs: 4.8 10*3/uL (ref 1.4–7.7)
PLATELETS: 203 10*3/uL (ref 150.0–400.0)
RBC: 4.71 Mil/uL (ref 4.22–5.81)
RDW: 13.1 % (ref 11.5–15.5)
WBC: 7 10*3/uL (ref 4.0–10.5)

## 2015-02-16 LAB — GLUCOSE, POCT (MANUAL RESULT ENTRY): POC Glucose: 144 mg/dl — AB (ref 70–99)

## 2015-02-16 LAB — TSH: TSH: 1.01 u[IU]/mL (ref 0.35–4.50)

## 2015-02-16 NOTE — Patient Instructions (Signed)
Your urine test was negative for infection. Complete lab work prior to leaving today. I will notify you of your results. Your blood sugar was 144. These symptoms may be related to dehydration or stress. It's important to increase your daily consumption of water to at least 70 ounces daily. Please call me in one week if no improvement. It was nice seeing you!

## 2015-02-16 NOTE — Telephone Encounter (Signed)
Pt has appt today at 1:45 pm with Allie Bossier NP.

## 2015-02-16 NOTE — Telephone Encounter (Signed)
Patient Name: [name]  DOB: 14-Oct-1965    Initial Comment Caller states her husband is shaking and is feeling weak since yesterday.    Nurse Assessment  Nurse: Mallie Mussel, RN, Alveta Heimlich Date/Time Eilene Ghazi Time): 02/16/2015 11:25:29 AM  Confirm and document reason for call. If symptomatic, describe symptoms. ---Caller states that she was only calling to get an appointment and she was transferred to [place]. She has since obtained an appointment time for her husband at 1:45. She states he's good to wait until then.  Has the patient traveled out of the country within the last 30 days? ---Not Applicable  Does the patient require triage? ---No     Guidelines    Guideline Title Affirmed Question Affirmed Notes       Final Disposition User   Clinical Call Mallie Mussel, RN, Alveta Heimlich

## 2015-02-16 NOTE — Progress Notes (Signed)
Pre visit review using our clinic review tool, if applicable. No additional management support is needed unless otherwise documented below in the visit note. 

## 2015-02-16 NOTE — Progress Notes (Signed)
Subjective:    Patient ID: Corey Bell, male    DOB: December 12, 1965, 49 y.o.   MRN: [id number]  HPI  Corey Bell is a 49 year old male who presents today with a chief complaint of generalized weakness and fatigue. He took his mother out to dinner yesterday, came home and started working with his tiller in the yard. Suddenly he felt shaky, jittery, and weak which he attributed to a low blood sugar.  He immediately consumed some jelly beans, checked his sugar later that afternoon which was 146. He also reports a warm sensation to bilateral arms and lower back pain last night so he took a Tramadol. He denies dehydration that afternoon as he had been drinking tea and soda.  Today he's feeling worse with the same symptoms and thought his blood sugar may have dropped, but he checked his blood sugar this morning and it was 143. Sugars typically run in 120's-140's.  He reports polydipsia and polyuria over the past 2 days with dry mouth. He's recently been under a moderate amount of stress due to a court date decision that is due tomorrow.   Review of Systems  Constitutional: Negative for fever and chills.  HENT: Negative for congestion, rhinorrhea and sore throat.   Respiratory: Negative for cough and shortness of breath.   Cardiovascular: Negative for chest pain and palpitations.  Endocrine: Positive for polydipsia and polyuria.  Genitourinary: Negative for dysuria, flank pain and difficulty urinating.  Musculoskeletal: Positive for myalgias.  Neurological: Negative for dizziness and numbness.       Past Medical History  Diagnosis Date  . Carpal tunnel syndrome, bilateral   . H/O acute prostatitis   . Unspecified essential hypertension   . Diverticulitis of colon   . Esophageal reflux   . Mixed hyperlipidemia   . 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 Seasonal allergies   . Aphthous ulcer 10-04-12    improving  . Chronic pain syndrome   . Insomnia, unspecified   . Tympanic membrane  perforation     history of  . Anxiety   . Lumbar degenerative disc disease   . Hypothyroidism   . COPD (chronic obstructive pulmonary disease)   . Sleep apnea     uses cpap  . Headache(784.0)     hx of migraines  . Depression     History   Social History  . Marital Status: Married    Spouse Name: [name]  . Number of Children: 0  . Years of Education: college   Occupational History  . disabled   .  Unemployed   Social History Main Topics  . Smoking status: Former Smoker -- 1.50 packs/day for 30 years    Types: Cigarettes    Quit date: 10/10/2007  . Smokeless tobacco: Never Used  . Alcohol Use: 0.6 oz/week    1 Cans of beer per week     Comment: rarely  . Drug Use: No  . Sexual Activity: Not on file   Other Topics Concern  . Not on file   Social History Narrative   Married.  [name])  No children. Patient has Best boy.   In process of getting disability for low back pain.   Right handed.   Caffeine- Coffee a pot but not every day.    Past Surgical History  Procedure Laterality Date  . Lumbarsacral disc surgery      L5-S1  . Tympanostomy tube placement  1975  . Meniscus repair  Left     torn meniscus  . Nesbit procedure N/A 08/22/2013    Procedure: 16 DOT PLACTATION;  Surgeon: Claybon Jabs, MD;  Location[facility];  Service: Urology;  Laterality: N/A;  . Vasectomy Bilateral 08/22/2013    Procedure: VASECTOMY;  Surgeon: Claybon Jabs, MD;  Location: [facility];  Service: Urology;  Laterality: Bilateral;    Family History  Problem Relation Age of Onset  . Bladder Cancer Paternal Uncle   . Renal cancer Paternal Uncle     renal cell carcinoma  . Congestive Heart Failure Father   . Heart disease Father   . COPD Father   . Renal cancer Paternal Grandfather     renal call carcinoma  . Heart disease Brother     stents at 48 yo  . Parkinsonism Neg Hx     Allergies  Allergen Reactions  . Augmentin  [Amoxicillin-Pot Clavulanate] Diarrhea  . Demerol [Meperidine]     "goes crazy and has the strength of 10 men"  . Eggs Or Egg-Derived Products Diarrhea and Nausea And Vomiting  . Statins Rash  . Sulfa Antibiotics Rash  . Sulfacetamide Sodium Rash    Current Outpatient Prescriptions on File Prior to Visit  Medication Sig Dispense Refill  . aspirin EC 81 MG tablet Take 1 tablet (81 mg total) by mouth daily.    . Blood Glucose Monitoring Suppl (ONE TOUCH BASIC SYSTEM) W/DEVICE KIT Check blood sugar once daily and as directed. Dx. E11.9 1 each 0  . buPROPion (WELLBUTRIN XL) 300 MG 24 hr tablet TAKE ONE (1) TABLET EACH DAY 30 tablet 5  . citalopram (CELEXA) 40 MG tablet TAKE ONE (1) TABLET EACH DAY 30 tablet 5  . Erythromycin 2 % ointment Apply topically twice daily as need 25 g 0  . fexofenadine (ALLEGRA) 180 MG tablet Take 180 mg by mouth daily.    Marland Kitchen gabapentin (NEURONTIN) 400 MG capsule TAKE ONE (1) CAPSULE FOUR (4) TIMES EACH DAY 120 capsule 3  . glucose blood test strip Check blood sugar twice daily and as directed. Dx. E11.9 100 each 3  . hyoscyamine (LEVSIN SL) 0.125 MG SL tablet TAKE ONE TABLET TWICE DAILY 60 tablet 5  . ibuprofen (ADVIL,MOTRIN) 600 MG tablet Take 600 mg by mouth every 6 (six) hours as needed.    Marland Kitchen levothyroxine (SYNTHROID, LEVOTHROID) 75 MCG tablet TAKE ONE (1) TABLET EACH DAY 30 tablet 5  . lisinopril (PRINIVIL,ZESTRIL) 10 MG tablet TAKE ONE (1) TABLET EACH DAY 30 tablet 3  . meloxicam (MOBIC) 15 MG tablet Take one to two tablets by mouth daily as needed Take one to two tablets by mouth daily as needed 60 tablet 1  . metFORMIN (GLUCOPHAGE) 500 MG tablet TAKE ONE (1) TABLET EACH DAY WITH BREAKFAST 90 tablet 1  . methocarbamol (ROBAXIN) 500 MG tablet 500 mg daily.  0  . metoprolol succinate (TOPROL-XL) 50 MG 24 hr tablet TAKE ONE (1) TABLET EACH DAY 30 tablet 5  . montelukast (SINGULAIR) 10 MG tablet Take 1 tablet (10 mg total) by mouth at bedtime. 30 tablet 3  .  omeprazole (PRILOSEC) 40 MG capsule Take 1 tab 30 in before breakfast daily. 90 capsule 3  . ONE TOUCH LANCETS MISC Check blood sugar once daily and as directed. Dx. E11.9 200 each 1  . oxyCODONE-acetaminophen (PERCOCET/ROXICET) 5-325 MG per tablet Take 1 tablet by mouth every 6 (six) hours as needed for severe pain. 30 tablet 0  . traMADol (  ULTRAM) 50 MG tablet Take 1 tablet (50 mg total) by mouth every 8 (eight) hours as needed for moderate pain or severe pain. 30 tablet 0  . vitamin B-12 (CYANOCOBALAMIN) 1000 MCG tablet Take 1,000 mcg by mouth daily.    Marland Kitchen ZETIA 10 MG tablet TAKE ONE (1) TABLET EACH DAY 90 tablet 1   No current facility-administered medications on file prior to visit.    BP 142/62 mmHg  Pulse 57  Temp(Src) 97.7 F (36.5 C) (Oral)  Ht 6' 2"  (1.88 m)  Wt 304 lb 6.4 oz (138.075 kg)  BMI 39.07 kg/m2  SpO2 96%    Objective:   Physical Exam  Constitutional: He is oriented to person, place, and time. He does not appear ill.  HENT:  Right Ear: External ear normal.  Left Ear: External ear normal.  Nose: Nose normal.  Mouth/Throat: Oropharynx is clear and moist.  Eyes: Conjunctivae are normal.  Neck: Neck supple.  Cardiovascular: Normal rate and regular rhythm.   Pulmonary/Chest: Effort normal and breath sounds normal.  Lymphadenopathy:    He has no cervical adenopathy.  Neurological: He is alert and oriented to person, place, and time.  Skin: Skin is warm and dry.  Psychiatric: He has a normal mood and affect.          Assessment & Plan:  Generalized weakness:  Suspect this may either be due to dehydration or stress.  CBG 144 in clinic today. Reviewed ECG from ED visit one week ago. Last A1C 6.5, 3 months ago. Labs from emergency department visit one week ago unremarkable. Will check CBC, CMP, and TSH today.  UA negative Push fluids, rest over next couple of days. Call if no improvement in 1 week.

## 2015-02-17 NOTE — Telephone Encounter (Signed)
Called and notified patient of Kate's comments. Patient verbalized understanding.  

## 2015-02-17 NOTE — Telephone Encounter (Signed)
Please notify Corey Bell to follow up with his PCP in 1 week if no improvement in symptoms. Please tell him to stay hydrated, try to reduce his stress, and  that I hope he feels better soon.

## 2015-02-18 ENCOUNTER — Telehealth: Payer: Self-pay

## 2015-02-18 NOTE — Telephone Encounter (Signed)
Education provided ot patient regarding proper diabetic diet during last visit and again on the phone this afternoon. He is to cut back on his carbohydrates and sugary foods. If BS remains high despite a proper diet, then follow up with PCP. He verbalized understanding.

## 2015-02-18 NOTE — Telephone Encounter (Signed)
Pt saw  Allie Bossier NP on 02/16/15; pt concerned about high BS levels; on 02/17/15 FBS was 165 and 8PM BS was 212. Pt had eaten french fries with supper.  02/18/15 FBS was 154. BS taken again 9:45 AM BS was 222; pt took metformin at 8:15 AM. For breakfast pt had special K cereal and black coffee. Pt wants to know if pt should be worried how BS are running. [employer]. Pt request cb

## 2015-02-19 ENCOUNTER — Other Ambulatory Visit: Payer: Self-pay | Admitting: Family Medicine

## 2015-02-25 ENCOUNTER — Ambulatory Visit: Payer: Self-pay | Admitting: Neurology

## 2015-02-25 ENCOUNTER — Encounter: Payer: Self-pay | Admitting: Neurology

## 2015-02-25 ENCOUNTER — Ambulatory Visit (INDEPENDENT_AMBULATORY_CARE_PROVIDER_SITE_OTHER): Payer: Medicare Other | Admitting: Neurology

## 2015-02-25 VITALS — BP 130/78 | HR 57 | Ht 74.0 in | Wt 304.0 lb

## 2015-02-25 DIAGNOSIS — R413 Other amnesia: Secondary | ICD-10-CM | POA: Diagnosis not present

## 2015-02-25 DIAGNOSIS — G5602 Carpal tunnel syndrome, left upper limb: Secondary | ICD-10-CM | POA: Diagnosis not present

## 2015-02-25 DIAGNOSIS — M25552 Pain in left hip: Secondary | ICD-10-CM | POA: Diagnosis not present

## 2015-02-25 DIAGNOSIS — G5601 Carpal tunnel syndrome, right upper limb: Secondary | ICD-10-CM | POA: Diagnosis not present

## 2015-02-25 DIAGNOSIS — G250 Essential tremor: Secondary | ICD-10-CM | POA: Diagnosis not present

## 2015-02-25 DIAGNOSIS — M5412 Radiculopathy, cervical region: Secondary | ICD-10-CM | POA: Diagnosis not present
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
## 2015-02-26 ENCOUNTER — Ambulatory Visit: Payer: 59 | Admitting: Neurology

## 2015-03-10 ENCOUNTER — Encounter: Payer: Self-pay | Admitting: Dietician

## 2015-03-10 ENCOUNTER — Encounter: Payer: PPO | Attending: Family Medicine | Admitting: Dietician

## 2015-03-10 VITALS — Ht 74.0 in | Wt 303.3 lb

## 2015-03-10 DIAGNOSIS — E119 Type 2 diabetes mellitus without complications: Secondary | ICD-10-CM | POA: Diagnosis present

## 2015-03-10 NOTE — Progress Notes (Signed)
Medical Nutrition Therapy: Visit start time: 0900  end time: 0930  Assessment:  Diagnosis: Type 2DM, obesity Past medical history: hypothyroidism, HTN, hyperlipidemia, Degenerative Disc Disease Psychosocial issues/ stress concerns: none per pt Preferred learning method:  . No preference indicated  Current weight: 303.3lbs  Height: 6'2" Medications, supplements: updated list in chart Progress and evaluation: Patient reports difficulty limiting snacks in evenings, currently eating 3-4 snacks each evening. He states he is considering trying hypnosis or surgery for weight loss.  Physical activity: none, other than some yardwork  Dietary Intake:  Usual eating pattern includes 3 meals and 4-5 snacks per day. Dining out frequency: ? meals per week.  Breakfast: [place] yogurt and banana; tried Special K cereal but BG increased too much Snack: apple Lunch: salad or sandwich Snack: occasionally, nuts or fruit Supper: Lean meat or beans and vegetables, fries once per week Snack: cereal (small bowl), nuts Beverages: water, coffee with Splenda, occasionally tea with Splenda  Nutrition Care Education: Topics covered: weight management, eating behaviors: snacking and ways to improve cravings for snacks, eating when not hungry. Weight control: behavioral changes for weight loss; discussed hypnosis (lack of evidence for significant weight loss), and surgery options for helping with weight loss Diabetes:  appropriate carb intake and balance: encouraged small amounts of carbohydrate with every meal, rather than one high-carb meal Other lifestyle changes:  increasing motivation,  identifying habits that need to change, physical activity  Nutritional Diagnosis:  [place]-3.3 Overweight/obesity As related to disability/ inactivity, frequent snacking, and hypothyroidism.  As evidenced by BMI 39.  Intervention: Discussion as noted above.    Patient frustrated with his inability to make significant  changes.   Encouraged him to implement motivating strategies; i.e. Reward system for meeting goals, and avoiding too-lofty goals.    Patient elected to forego further follow-up visits at this time; RD will contact him by e-mail or phone in 2 months to check on progress.  Education Materials given:  Marland Kitchen Goals/ instructions  Learner/ who was taught:  . Patient   Level of understanding: Marland Kitchen Verbalizes/ demonstrates competency  Demonstrated degree of understanding via:   Teach back Learning barriers: . None  Willingness to learn/ readiness for change: . Hesitance, contemplating change  Monitoring and Evaluation:  Dietary intake, exercise, BG control, and body weight by phone or e-mail in 2 months.

## 2015-03-10 NOTE — Patient Instructions (Signed)
When eating cereal, keep to a small bowl and add a protein, such as 1/4 cup nuts or less. Concentrate on managing evening snacks; consider activity such as a word puzzle to occupy hands and mind and divert thoughts away from eating. Consider starting tracking intake using free app or website such as MyFitnessPal or LoseIt.  Gradually increase some physical activity. Figure out a motivating factor to achieve goals -- have someone else hold you to your goals, and reward yourself when achieving a goal.

## 2015-03-16 ENCOUNTER — Other Ambulatory Visit: Payer: Self-pay | Admitting: Family Medicine

## 2015-03-16 NOTE — Telephone Encounter (Signed)
Last f/u appt 11/2014 

## 2015-03-16 NOTE — Telephone Encounter (Signed)
Rx called in to requested pharmacy 

## 2015-04-02 ENCOUNTER — Telehealth: Payer: Self-pay | Admitting: Family Medicine

## 2015-04-02 NOTE — Telephone Encounter (Signed)
Patient Name: Corey Bell DOB: August 27, 1966 Initial Comment Caller states he is a diabetic feels like his sugar is getting low his vision is blurring trouble speaking Nurse Assessment Nurse: Vallery Sa, RN, Tye [place] Date/Time (Eastern Time): 04/02/2015 1:19:29 PM Confirm and document reason for call. If symptomatic, describe symptoms. ---Caller states he has Diabetes Type 2. He is unable to check his sugar level, but estimates his blood sugar to be low. He developed vision and speaking difficulty about 45 minutes ago. He had something to eat, but isn't feeling better. Has the patient traveled out of the country within the last 30 days? ---No Does the patient require triage? ---Yes Related visit to physician within the last 2 weeks? ---Yes Does the PT have any chronic conditions? (i.e. diabetes, asthma, etc.) ---Yes List chronic conditions. ---Diabetes Type 2, high Blood Pressure, Obese Guidelines Guideline Title Affirmed Question Affirmed Notes Diabetes - Low Blood Sugar Low blood sugar definition and treatment, questions about Final Disposition User [place], RN, Tye [place] Comments [name] feels that his blood sugar is improving after having something to eat. He will check his blood sugar and call back with any concerns or questions. His wife is with him and she will continue to monitor him.

## 2015-04-02 NOTE — Telephone Encounter (Signed)
Noted. Feeling better after eating. Check CBG

## 2015-04-05 ENCOUNTER — Other Ambulatory Visit: Payer: Self-pay | Admitting: Family Medicine

## 2015-04-07 ENCOUNTER — Ambulatory Visit (INDEPENDENT_AMBULATORY_CARE_PROVIDER_SITE_OTHER): Payer: Medicare Other | Admitting: Family Medicine

## 2015-04-07 ENCOUNTER — Encounter: Payer: Self-pay | Admitting: *Deleted

## 2015-04-07 ENCOUNTER — Encounter: Payer: Self-pay | Admitting: Family Medicine

## 2015-04-07 VITALS — BP 122/74 | HR 60 | Temp 97.8°F | Wt 304.0 lb

## 2015-04-07 DIAGNOSIS — E038 Other specified hypothyroidism: Secondary | ICD-10-CM

## 2015-04-07 DIAGNOSIS — I10 Essential (primary) hypertension: Secondary | ICD-10-CM | POA: Diagnosis not present

## 2015-04-07 DIAGNOSIS — R194 Change in bowel habit: Secondary | ICD-10-CM

## 2015-04-07 DIAGNOSIS — K219 Gastro-esophageal reflux disease without esophagitis: Secondary | ICD-10-CM | POA: Insufficient documentation

## 2015-04-07 DIAGNOSIS — E119 Type 2 diabetes mellitus without complications: Secondary | ICD-10-CM | POA: Diagnosis not present

## 2015-04-07 LAB — COMPREHENSIVE METABOLIC PANEL
ALK PHOS: 58 U/L (ref 39–117)
ALT: 69 U/L — ABNORMAL HIGH (ref 0–53)
AST: 46 U/L — ABNORMAL HIGH (ref 0–37)
Albumin: 4.2 g/dL (ref 3.5–5.2)
BUN: 20 mg/dL (ref 6–23)
CO2: 30 meq/L (ref 19–32)
Calcium: 9.6 mg/dL (ref 8.4–10.5)
Chloride: 103 mEq/L (ref 96–112)
Creatinine, Ser: 1.12 mg/dL (ref 0.40–1.50)
GFR: 74.23 mL/min (ref >60.00)
GLUCOSE: 84 mg/dL (ref 70–99)
POTASSIUM: 4.7 meq/L (ref 3.5–5.1)
Sodium: 139 mEq/L (ref 135–145)
Total Bilirubin: 1 mg/dL (ref 0.2–1.2)
Total Protein: 7.2 g/dL (ref 6.0–8.3)

## 2015-04-07 LAB — LIPID PANEL
Cholesterol: 154 mg/dL (ref 0–200)
HDL: 28.5 mg/dL — ABNORMAL LOW (ref >39.00)
NonHDL: 125.5
Total CHOL/HDL Ratio: 5
Triglycerides: 276 mg/dL — ABNORMAL HIGH (ref 0.0–149.0)
VLDL: 55.2 mg/dL — ABNORMAL HIGH (ref 0.0–40.0)

## 2015-04-07 LAB — LDL CHOLESTEROL, DIRECT: Direct LDL: 93 mg/dL

## 2015-04-07 LAB — HEMOGLOBIN A1C: HEMOGLOBIN A1C: 6.3 % (ref 4.6–6.5)

## 2015-04-07 MED ORDER — FREESTYLE SYSTEM KIT: 1.0000 | PACK | Status: DC | PRN

## 2015-04-07 MED ORDER — OMEPRAZOLE 40 MG PO CPDR: 40.0000 mg | DELAYED_RELEASE_CAPSULE | Freq: Every day | ORAL | Status: DC

## 2015-04-07 NOTE — Assessment & Plan Note (Signed)
Resolved. D/c Anazpaz.   Asymptomatic and insurance will no longer cover it. Call or return to clinic prn if these symptoms worsen or fail to improve as anticipated. The patient indicates understanding of these issues and agrees with the plan.

## 2015-04-07 NOTE — Patient Instructions (Signed)
Great to see you. Say hi to [place] for me.  We are stopping the levin SL- left me know how you do.  I am giving a prescription for generic omeprazole to take to your pharmacy- ok to try to skip a couple.

## 2015-04-07 NOTE — Progress Notes (Signed)
Subjective:   Patient ID: Corey Bell, male    DOB: 12-28-1965, 49 y.o.   MRN: [id number]  Corey Bell is a pleasant 49 y.o. year old male who presents with his wife, [name], to clinic today with Follow-up  on 04/07/2015  HPI: DM- diagnosed in August 2015.   Lab Results  Component Value Date   HGBA1C 6.5 11/18/2014   No Family h/o DM.  Brings in log- FSBS fasting much improved- ranging no higher than 140s and he has had no further episodes of hyopglycemia.  On ACEI- Lisinopril 10 mg daily.  Urine micro neg 09/07/14 Started on Metformin 500 mg daily on 06/03/14.  Also added Zetia since LDL not at goal for diabetic (he is aware that does not have the same mortality data as statins).      Has been intolerant to multiple statins but he is on zetia- LDL at goal for diabetic. Lab Results  Component Value Date   CHOL 153 07/27/2014   HDL 26.00* 07/27/2014   LDLCALC 91 07/27/2014   LDLDIRECT 149.5 07/01/2013   TRIG 179.0* 07/27/2014   CHOLHDL 6 07/27/2014     Eye exam-Dr. Valetta Close- 08/25/14- neg for diabetic retinopathy.  GERD- insurance will no longer cover prilosec.  Brings in his formulary packet for me to review other alternatives.  Also insurance will no longer cover Anaspaz.  He is not sure if he needs it anymore.  Not having any GI symptoms currently.   Lab Results  Component Value Date   CHOL 153 07/27/2014   HDL 26.00* 07/27/2014   LDLCALC 91 07/27/2014   LDLDIRECT 149.5 07/01/2013   TRIG 179.0* 07/27/2014   CHOLHDL 6 07/27/2014    Current Outpatient Prescriptions on File Prior to Visit  Medication Sig Dispense Refill  . aspirin EC 81 MG tablet Take 1 tablet (81 mg total) by mouth daily.    . fexofenadine (ALLEGRA) 180 MG tablet Take 180 mg by mouth daily.    Marland Kitchen ibuprofen (ADVIL,MOTRIN) 600 MG tablet Take 600 mg by mouth every 6 (six) hours as needed.    . meloxicam (MOBIC) 15 MG tablet Take one to two tablets by mouth daily as needed Take one to two tablets by  mouth daily as needed 60 tablet 1  . metFORMIN (GLUCOPHAGE) 500 MG tablet TAKE ONE (1) TABLET EACH DAY WITH BREAKFAST 90 tablet 0  . methocarbamol (ROBAXIN) 500 MG tablet 500 mg daily.  0  . montelukast (SINGULAIR) 10 MG tablet TAKE ONE TABLET AT BEDTIME 30 tablet 5  . ONE TOUCH LANCETS MISC Check blood sugar once daily and as directed. Dx. E11.9 200 each 1  . oxyCODONE-acetaminophen (PERCOCET/ROXICET) 5-325 MG per tablet Take 1 tablet by mouth every 6 (six) hours as needed for severe pain. 30 tablet 0  . saw palmetto 80 MG capsule Take by mouth.    . traMADol (ULTRAM) 50 MG tablet TAKE ONE TABLET BY MOUTH EVERY 8 HOURS AS NEEDED FOR MODERATE PAIN ORSEVERE PAIN 30 tablet 0  . vitamin B-12 (CYANOCOBALAMIN) 1000 MCG tablet Take 1,000 mcg by mouth daily.     No current facility-administered medications on file prior to visit.    Allergies  Allergen Reactions  . Augmentin [Amoxicillin-Pot Clavulanate] Diarrhea  . Demerol [Meperidine]     "goes crazy and has the strength of 10 men"  . Eggs Or Egg-Derived Products Diarrhea and Nausea And Vomiting  . Statins Rash  . Sulfa Antibiotics Rash  . Sulfacetamide Sodium Rash  Past Medical History  Diagnosis Date  . Carpal tunnel syndrome, bilateral   . H/O acute prostatitis   . Unspecified essential hypertension   . Diverticulitis of colon   . Esophageal reflux   . Mixed hyperlipidemia   . 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 Seasonal allergies   . Aphthous ulcer 10-04-12    improving  . Chronic pain syndrome   . Insomnia, unspecified   . Tympanic membrane perforation     history of  . Anxiety   . Lumbar degenerative disc disease   . Hypothyroidism   . COPD (chronic obstructive pulmonary disease)   . Sleep apnea     uses cpap  . Headache(784.0)     hx of migraines  . Depression     Past Surgical History  Procedure Laterality Date  . Lumbarsacral disc surgery      L5-S1  . Tympanostomy tube placement  1975  . Meniscus  repair Left     torn meniscus  . Nesbit procedure N/A 08/22/2013    Procedure: 16 DOT PLACTATION;  Surgeon: Claybon Jabs, MD;  Location: [facility];  Service: Urology;  Laterality: N/A;  . Vasectomy Bilateral 08/22/2013    Procedure: VASECTOMY;  Surgeon: Claybon Jabs, MD;  Location: [facility];  Service: Urology;  Laterality: Bilateral;    Family History  Problem Relation Age of Onset  . Bladder Cancer Paternal Uncle   . Renal cancer Paternal Uncle     renal cell carcinoma  . Congestive Heart Failure Father   . Heart disease Father   . COPD Father   . Renal cancer Paternal Grandfather     renal call carcinoma  . Heart disease Brother     stents at 54 yo  . Parkinsonism Neg Hx     History   Social History  . Marital Status: Married    Spouse Name: [name]  . Number of Children: 0  . Years of Education: college   Occupational History  . disabled   .  Unemployed   Social History Main Topics  . Smoking status: Former Smoker -- 1.50 packs/day for 30 years    Types: Cigarettes    Quit date: 10/10/2007  . Smokeless tobacco: Never Used  . Alcohol Use: 0.6 oz/week    1 Cans of beer per week     Comment: rarely  . Drug Use: No  . Sexual Activity: Not on file   Other Topics Concern  . Not on file   Social History Narrative   Married.  [name])  No children. Patient has Best boy.   In process of getting disability for low back pain.   Right handed.   Caffeine- Coffee a pot but not every day.   The PMH, PSH, Social History, Family History, Medications, and allergies have been reviewed in [facility], and have been updated if relevant.    Review of Systems  Constitutional: Negative.   HENT: Negative.   Eyes: Negative.   Respiratory: Negative.   Cardiovascular: Negative.   Gastrointestinal: Negative.   Genitourinary: Negative.   Musculoskeletal: Negative.   Skin: Negative.   Hematological: Negative.   Psychiatric/Behavioral:  Negative.   All other systems reviewed and are negative.      Objective:    BP 122/74 mmHg  Pulse 60  Temp(Src) 97.8 F (36.6 C) (Oral)  Wt 304 lb (137.893 kg)  SpO2 97%   Physical Exam  Constitutional: He is oriented to  person, place, and time. He appears well-developed and well-nourished. No distress.  HENT:  Head: Normocephalic and atraumatic.  Eyes: Conjunctivae are normal.  Neck: Normal range of motion.  Cardiovascular: Normal rate.   Pulmonary/Chest: Effort normal.  Abdominal: Soft.  Musculoskeletal: He exhibits no edema.  Neurological: He is alert and oriented to person, place, and time. No cranial nerve deficit.  Skin: Skin is warm and dry.  Psychiatric: He has a normal mood and affect. His behavior is normal. Judgment and thought content normal.  Nursing note and vitals reviewed.         Assessment & Plan:   Essential hypertension  Other specified hypothyroidism  Diabetes mellitus, new onset - Plan: Hemoglobin A1c, Comprehensive metabolic panel, Lipid panel  Bowel habit changes  Gastroesophageal reflux disease, esophagitis presence not specified No Follow-up on file.

## 2015-04-07 NOTE — Assessment & Plan Note (Signed)
Well controlled.  Recheck a1c since he was having fluctuating blood sugars in recent months. The patient indicates understanding of these issues and agrees with the plan.  Orders Placed This Encounter  Procedures  . Hemoglobin A1c  . Comprehensive metabolic panel  . Lipid panel

## 2015-04-07 NOTE — Assessment & Plan Note (Signed)
Change rx to generic omeprazole which is covered. Follow up prn. The patient indicates understanding of these issues and agrees with the plan.

## 2015-04-07 NOTE — Progress Notes (Signed)
Pre visit review using our clinic review tool, if applicable. No additional management support is needed unless otherwise documented below in the visit note. 

## 2015-04-19 ENCOUNTER — Other Ambulatory Visit: Payer: Self-pay | Admitting: Family Medicine

## 2015-04-28 ENCOUNTER — Other Ambulatory Visit: Payer: Self-pay | Admitting: Family Medicine

## 2015-05-17 ENCOUNTER — Other Ambulatory Visit: Payer: Self-pay | Admitting: Family Medicine

## 2015-05-27 ENCOUNTER — Ambulatory Visit: Payer: 59 | Admitting: Psychology

## 2015-05-31 ENCOUNTER — Other Ambulatory Visit: Payer: Self-pay | Admitting: Family Medicine

## 2015-06-10 ENCOUNTER — Ambulatory Visit (INDEPENDENT_AMBULATORY_CARE_PROVIDER_SITE_OTHER): Payer: PPO | Admitting: Psychology

## 2015-06-10 DIAGNOSIS — F4323 Adjustment disorder with mixed anxiety and depressed mood: Secondary | ICD-10-CM

## 2015-06-15 ENCOUNTER — Other Ambulatory Visit: Payer: Self-pay | Admitting: Family Medicine

## 2015-06-28 ENCOUNTER — Ambulatory Visit (INDEPENDENT_AMBULATORY_CARE_PROVIDER_SITE_OTHER): Payer: PPO | Admitting: Family Medicine

## 2015-06-28 ENCOUNTER — Encounter: Payer: Self-pay | Admitting: Family Medicine

## 2015-06-28 VITALS — BP 124/66 | HR 82 | Temp 97.9°F | Wt 301.2 lb

## 2015-06-28 DIAGNOSIS — G4700 Insomnia, unspecified: Secondary | ICD-10-CM

## 2015-06-28 DIAGNOSIS — E119 Type 2 diabetes mellitus without complications: Secondary | ICD-10-CM | POA: Diagnosis not present

## 2015-06-28 DIAGNOSIS — E038 Other specified hypothyroidism: Secondary | ICD-10-CM | POA: Diagnosis not present

## 2015-06-28 DIAGNOSIS — K219 Gastro-esophageal reflux disease without esophagitis: Secondary | ICD-10-CM

## 2015-06-28 LAB — COMPREHENSIVE METABOLIC PANEL
ALT: 53 U/L (ref 0–53)
AST: 33 U/L (ref 0–37)
Albumin: 4.4 g/dL (ref 3.5–5.2)
Alkaline Phosphatase: 52 U/L (ref 39–117)
BILIRUBIN TOTAL: 1.3 mg/dL — AB (ref 0.2–1.2)
BUN: 17 mg/dL (ref 6–23)
CHLORIDE: 103 meq/L (ref 96–112)
CO2: 29 meq/L (ref 19–32)
Calcium: 9.6 mg/dL (ref 8.4–10.5)
Creatinine, Ser: 1.14 mg/dL (ref 0.40–1.50)
GFR: 72.66 mL/min (ref >60.00)
GLUCOSE: 97 mg/dL (ref 70–99)
Potassium: 5 mEq/L (ref 3.5–5.1)
Sodium: 137 mEq/L (ref 135–145)
Total Protein: 7.6 g/dL (ref 6.0–8.3)

## 2015-06-28 LAB — T4, FREE: Free T4: 0.82 ng/dL (ref 0.60–1.60)

## 2015-06-28 LAB — HEMOGLOBIN A1C: HEMOGLOBIN A1C: 6.2 % (ref 4.6–6.5)

## 2015-06-28 MED ORDER — DIAZEPAM 5 MG PO TABS: 5.0000 mg | ORAL_TABLET | Freq: Four times a day (QID) | ORAL | Status: DC | PRN

## 2015-06-28 NOTE — Progress Notes (Signed)
Subjective:   Patient ID: Corey Bell, male    DOB: October 23, 1965, 49 y.o.   MRN: [id number]  Corey Bell is a pleasant 49 y.o. year old male who presents with his wife, [name], to clinic today with Follow-up  on 06/28/2015  HPI: DM- diagnosed in August 2015.   Lab Results  Component Value Date   HGBA1C 6.3 04/07/2015   No Family h/o DM.  Brings in log- FSBS fasting much improved- ranging no higher than 120s and he has had no further episodes of hyopglycemia.  On ACEI- Lisinopril 10 mg daily.  Urine micro neg 09/07/14 Started on Metformin 500 mg daily on 06/03/14.  Also added Zetia since LDL not at goal for diabetic (he is aware that does not have the same mortality data as statins).      Has been intolerant to multiple statins but he is on zetia- LDL at goal for diabetic. Lab Results  Component Value Date   CHOL 154 04/07/2015   HDL 28.50* 04/07/2015   LDLCALC 91 07/27/2014   LDLDIRECT 93.0 04/07/2015   TRIG 276.0* 04/07/2015   CHOLHDL 5 04/07/2015    GERD- insurance will no longer cover prilosec.  Brings in his formulary packet for me to review other alternatives.  Also insurance will no longer cover Anaspaz.  He is not sure if he needs it anymore.  Not having any GI symptoms currently.   Lab Results  Component Value Date   CHOL 154 04/07/2015   HDL 28.50* 04/07/2015   LDLCALC 91 07/27/2014   LDLDIRECT 93.0 04/07/2015   TRIG 276.0* 04/07/2015   CHOLHDL 5 04/07/2015   Hypothyroidism- taking synthroid 75 mcg daily. Denies any symptoms of hypo or hyperthyroidism other than difficulty losing weight. Lab Results  Component Value Date   TSH 1.01 02/16/2015   Insomnia- a few times a month, difficulty falling asleep.  In past, used very infrequent prn valium.  He is asking for rx to be refilled today.  Current Outpatient Prescriptions on File Prior to Visit  Medication Sig Dispense Refill  . aspirin EC 81 MG tablet Take 1 tablet (81 mg total) by mouth daily.    Marland Kitchen  buPROPion (WELLBUTRIN XL) 300 MG 24 hr tablet Take 300 mg by mouth daily.    . citalopram (CELEXA) 40 MG tablet TAKE ONE (1) TABLET EACH DAY 30 tablet 5  . erythromycin with ethanol (THERAMYCIN) 2 % external solution Apply topically daily.    . fexofenadine (ALLEGRA) 180 MG tablet Take 180 mg by mouth daily.    Marland Kitchen FREESTYLE LITE test strip USE AS DIRECTED 50 each 5  . gabapentin (NEURONTIN) 800 MG tablet Take 800 mg by mouth 4 (four) times daily.     Marland Kitchen glucose monitoring kit (FREESTYLE) monitoring kit 1 each by Does not apply route as needed for other. Use as directed 1 each 0  . ibuprofen (ADVIL,MOTRIN) 600 MG tablet Take 600 mg by mouth every 6 (six) hours as needed.    Marland Kitchen levothyroxine (SYNTHROID, LEVOTHROID) 75 MCG tablet TAKE ONE (1) TABLET EACH DAY 30 tablet 11  . lisinopril (PRINIVIL,ZESTRIL) 10 MG tablet Take 10 mg by mouth daily.    . metFORMIN (GLUCOPHAGE) 500 MG tablet TAKE ONE (1) TABLET EACH DAY WITH BREAKFAST 90 tablet 0  . metoprolol succinate (TOPROL-XL) 50 MG 24 hr tablet TAKE ONE (1) TABLET EACH DAY 30 tablet 5  . montelukast (SINGULAIR) 10 MG tablet TAKE ONE TABLET AT BEDTIME 30 tablet 5  .  ONE TOUCH LANCETS MISC Check blood sugar once daily and as directed. Dx. E11.9 200 each 1  . oxyCODONE-acetaminophen (PERCOCET/ROXICET) 5-325 MG per tablet Take 1 tablet by mouth every 6 (six) hours as needed for severe pain. 30 tablet 0  . saw palmetto 80 MG capsule Take by mouth.    . traMADol (ULTRAM) 50 MG tablet TAKE ONE TABLET BY MOUTH EVERY 8 HOURS AS NEEDED FOR MODERATE PAIN ORSEVERE PAIN 30 tablet 0  . vitamin B-12 (CYANOCOBALAMIN) 1000 MCG tablet Take 1,000 mcg by mouth daily.    Marland Kitchen ZETIA 10 MG tablet TAKE ONE (1) TABLET EACH DAY 90 tablet 3   No current facility-administered medications on file prior to visit.    Allergies  Allergen Reactions  . Augmentin [Amoxicillin-Pot Clavulanate] Diarrhea  . Demerol [Meperidine]     "goes crazy and has the strength of 10 men"  . Eggs Or  Egg-Derived Products Diarrhea and Nausea And Vomiting  . Statins Rash  . Sulfa Antibiotics Rash  . Sulfacetamide Sodium Rash    Past Medical History  Diagnosis Date  . Carpal tunnel syndrome, bilateral   . H/O acute prostatitis   . Unspecified essential hypertension   . Diverticulitis of colon   . Esophageal reflux   . Mixed hyperlipidemia   . 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 Seasonal allergies   . Aphthous ulcer 10-04-12    improving  . Chronic pain syndrome   . Insomnia, unspecified   . Tympanic membrane perforation     history of  . Anxiety   . Lumbar degenerative disc disease   . Hypothyroidism   . COPD (chronic obstructive pulmonary disease)   . Sleep apnea     uses cpap  . Headache(784.0)     hx of migraines  . Depression     Past Surgical History  Procedure Laterality Date  . Lumbarsacral disc surgery      L5-S1  . Tympanostomy tube placement  1975  . Meniscus repair Left     torn meniscus  . Nesbit procedure N/A 08/22/2013    Procedure: 16 DOT PLACTATION;  Surgeon: Claybon Jabs, MD;  Location: [facility];  Service: Urology;  Laterality: N/A;  . Vasectomy Bilateral 08/22/2013    Procedure: VASECTOMY;  Surgeon: Claybon Jabs, MD;  [facility];  Service: Urology;  Laterality: Bilateral;    Family History  Problem Relation Age of Onset  . Bladder Cancer Paternal Uncle   . Renal cancer Paternal Uncle     renal cell carcinoma  . Congestive Heart Failure Father   . Heart disease Father   . COPD Father   . Renal cancer Paternal Grandfather     renal call carcinoma  . Heart disease Brother     stents at 27 yo  . Parkinsonism Neg Hx     Social History   Social History  . Marital Status: Married    Spouse Name: [name]  . Number of Children: 0  . Years of Education: college   Occupational History  . disabled   .  Unemployed   Social History Main Topics  . Smoking status: Former Smoker -- 1.50  packs/day for 30 years    Types: Cigarettes    Quit date: 10/10/2007  . Smokeless tobacco: Never Used  . Alcohol Use: 0.6 oz/week    1 Cans of beer per week     Comment: rarely  . Drug Use: No  .  Sexual Activity: Not on file   Other Topics Concern  . Not on file   Social History Narrative   Married.  [name])  No children. Patient has Best boy.   In process of getting disability for low back pain.   Right handed.   Caffeine- Coffee a pot but not every day.   The PMH, PSH, Social History, Family History, Medications, and allergies have been reviewed in [facility] and have been updated if relevant.    Review of Systems  Constitutional: Negative.   HENT: Negative.   Eyes: Negative.   Respiratory: Negative.   Cardiovascular: Negative.   Gastrointestinal: Negative.   Genitourinary: Negative.   Musculoskeletal: Negative.   Skin: Negative.   Hematological: Negative.   Psychiatric/Behavioral: Negative.   All other systems reviewed and are negative.      Objective:    BP 124/66 mmHg  Pulse 82  Temp(Src) 97.9 F (36.6 C) (Oral)  Wt 301 lb 4 oz (136.646 kg)  SpO2 97%   Physical Exam  Constitutional: He is oriented to person, place, and time. He appears well-developed and well-nourished. No distress.  HENT:  Head: Normocephalic and atraumatic.  Eyes: Conjunctivae are normal.  Neck: Normal range of motion.  Cardiovascular: Normal rate.   Pulmonary/Chest: Effort normal.  Abdominal: Soft.  Musculoskeletal: He exhibits no edema.  Neurological: He is alert and oriented to person, place, and time. No cranial nerve deficit.  Skin: Skin is warm and dry.  Psychiatric: He has a normal mood and affect. His behavior is normal. Judgment and thought content normal.  Nursing note and vitals reviewed.         Assessment & Plan:   Diabetes mellitus, new onset - Plan: Hemoglobin A1c, Comprehensive metabolic panel  Gastroesophageal reflux disease, esophagitis presence not  specified  Insomnia  Other specified hypothyroidism - Plan: TSH, T4, Free No Follow-up on file.

## 2015-06-28 NOTE — Patient Instructions (Signed)
Great to see you. Please keep me updated. 

## 2015-06-28 NOTE — Assessment & Plan Note (Signed)
Improved control. Due for a1c today. Continue current rx. The patient indicates understanding of these issues and agrees with the plan.  Orders Placed This Encounter  Procedures  . Hemoglobin A1c  . Comprehensive metabolic panel  . TSH  . T4, Free

## 2015-06-28 NOTE — Progress Notes (Signed)
Pre visit review using our clinic review tool, if applicable. No additional management support is needed unless otherwise documented below in the visit note. 

## 2015-06-28 NOTE — Assessment & Plan Note (Signed)
Recheck labs today. 

## 2015-06-28 NOTE — Assessment & Plan Note (Signed)
  The problem of recurrent insomnia is discussed. Avoidance of caffeine sources is strongly encouraged. Sleep hygiene issues are reviewed. The use of sedative hypnotics for temporary relief is appropriate; we discussed the addictive nature of these drugs, and a one-time only prescription for prn use of a hypnotic is given, to use no more than 3 times per week for 2-3 weeks.

## 2015-06-29 ENCOUNTER — Encounter: Payer: Self-pay | Admitting: *Deleted

## 2015-06-30 ENCOUNTER — Other Ambulatory Visit: Payer: Self-pay | Admitting: *Deleted

## 2015-06-30 MED ORDER — METFORMIN HCL 500 MG PO TABS
ORAL_TABLET | ORAL | Status: DC
Start: 2015-06-30 — End: 2015-09-23

## 2015-07-15 ENCOUNTER — Ambulatory Visit: Payer: PPO | Admitting: Psychology

## 2015-07-22 ENCOUNTER — Ambulatory Visit (INDEPENDENT_AMBULATORY_CARE_PROVIDER_SITE_OTHER): Payer: PPO | Admitting: Psychology

## 2015-07-22 DIAGNOSIS — F4323 Adjustment disorder with mixed anxiety and depressed mood: Secondary | ICD-10-CM

## 2015-07-26 ENCOUNTER — Encounter: Payer: Self-pay | Admitting: Family Medicine

## 2015-07-26 ENCOUNTER — Ambulatory Visit (INDEPENDENT_AMBULATORY_CARE_PROVIDER_SITE_OTHER): Payer: PPO | Admitting: Family Medicine

## 2015-07-26 VITALS — BP 122/80 | HR 53 | Temp 98.1°F | Wt 293.0 lb

## 2015-07-26 DIAGNOSIS — E785 Hyperlipidemia, unspecified: Secondary | ICD-10-CM | POA: Diagnosis not present

## 2015-07-26 MED ORDER — EZETIMIBE 10 MG PO TABS: ORAL_TABLET | ORAL | Status: DC

## 2015-07-26 NOTE — Progress Notes (Signed)
Pre visit review using our clinic review tool, if applicable. No additional management support is needed unless otherwise documented below in the visit note. 

## 2015-07-26 NOTE — Assessment & Plan Note (Addendum)
>  15 minutes spent in face to face time with patient, >50% spent in counselling or coordination of care Discussed importance of continuing to take zetia to lower LDL given his comorbidities. Currently at goal. The patient indicates understanding of these issues and agrees with the plan.

## 2015-07-26 NOTE — Progress Notes (Signed)
Subjective:   Patient ID: Corey Bell, male    DOB: 1966/06/04, 49 y.o.   MRN: [id number]  Corey Bell is a pleasant 49 y.o. year old male who presents to clinic today with discuss medication  on 07/26/2015  HPI: Here to discuss cholesterol rx.  Cannot tolerate statins or niacin.  Zetia has been working well and he continues to lose weight with improved diet. Sugars have been running in low 100s.  Wants to discuss if he truly needs zetia.  Current Outpatient Prescriptions on File Prior to Visit  Medication Sig Dispense Refill  . aspirin EC 81 MG tablet Take 1 tablet (81 mg total) by mouth daily.    Marland Kitchen buPROPion (WELLBUTRIN XL) 300 MG 24 hr tablet Take 300 mg by mouth daily.    . citalopram (CELEXA) 40 MG tablet TAKE ONE (1) TABLET EACH DAY 30 tablet 5  . diazepam (VALIUM) 5 MG tablet Take 1 tablet (5 mg total) by mouth every 6 (six) hours as needed for anxiety. 30 tablet 0  . erythromycin with ethanol (THERAMYCIN) 2 % external solution Apply topically daily.    . fexofenadine (ALLEGRA) 180 MG tablet Take 180 mg by mouth daily.    Marland Kitchen FREESTYLE LITE test strip USE AS DIRECTED 50 each 5  . gabapentin (NEURONTIN) 800 MG tablet Take 800 mg by mouth 4 (four) times daily.     Marland Kitchen glucose monitoring kit (FREESTYLE) monitoring kit 1 each by Does not apply route as needed for other. Use as directed 1 each 0  . ibuprofen (ADVIL,MOTRIN) 600 MG tablet Take 600 mg by mouth every 6 (six) hours as needed.    Marland Kitchen levothyroxine (SYNTHROID, LEVOTHROID) 75 MCG tablet TAKE ONE (1) TABLET EACH DAY 30 tablet 11  . lisinopril (PRINIVIL,ZESTRIL) 10 MG tablet Take 10 mg by mouth daily.    . metFORMIN (GLUCOPHAGE) 500 MG tablet TAKE ONE (1) TABLET EACH DAY WITH BREAKFAST 90 tablet 1  . metoprolol succinate (TOPROL-XL) 50 MG 24 hr tablet TAKE ONE (1) TABLET EACH DAY 30 tablet 5  . montelukast (SINGULAIR) 10 MG tablet TAKE ONE TABLET AT BEDTIME 30 tablet 5  . ONE TOUCH LANCETS MISC Check blood sugar once daily  and as directed. Dx. E11.9 200 each 1  . oxyCODONE-acetaminophen (PERCOCET/ROXICET) 5-325 MG per tablet Take 1 tablet by mouth every 6 (six) hours as needed for severe pain. 30 tablet 0  . ranitidine (ZANTAC) 150 MG tablet Take 150 mg by mouth 2 (two) times daily.    . saw palmetto 80 MG capsule Take by mouth.    . traMADol (ULTRAM) 50 MG tablet TAKE ONE TABLET BY MOUTH EVERY 8 HOURS AS NEEDED FOR MODERATE PAIN ORSEVERE PAIN 30 tablet 0  . vitamin B-12 (CYANOCOBALAMIN) 1000 MCG tablet Take 1,000 mcg by mouth daily.     No current facility-administered medications on file prior to visit.    Allergies  Allergen Reactions  . Augmentin [Amoxicillin-Pot Clavulanate] Diarrhea  . Demerol [Meperidine]     "goes crazy and has the strength of 10 men"  . Eggs Or Egg-Derived Products Diarrhea and Nausea And Vomiting  . Statins Rash  . Sulfa Antibiotics Rash  . Sulfacetamide Sodium Rash    Past Medical History  Diagnosis Date  . Carpal tunnel syndrome, bilateral   . H/O acute prostatitis   . Unspecified essential hypertension   . Diverticulitis of colon   . Esophageal reflux   . Mixed hyperlipidemia   . Peyronie disease  12-20-12    PER uROLOGY  . Seasonal allergies   . Aphthous ulcer 10-04-12    improving  . Chronic pain syndrome   . Insomnia, unspecified   . Tympanic membrane perforation     history of  . Anxiety   . Lumbar degenerative disc disease   . Hypothyroidism   . COPD (chronic obstructive pulmonary disease) ([place])   . Sleep apnea     uses cpap  . Headache(784.0)     hx of migraines  . Depression     Past Surgical History  Procedure Laterality Date  . Lumbarsacral disc surgery      L5-S1  . Tympanostomy tube placement  1975  . Meniscus repair Left     torn meniscus  . Nesbit procedure N/A 08/22/2013    Procedure: 16 DOT PLACTATION;  Surgeon: Claybon Jabs, MD;  Location: [facility];  Service: Urology;  Laterality: N/A;  . Vasectomy Bilateral  08/22/2013    Procedure: VASECTOMY;  Surgeon: Claybon Jabs, MD;  Location: [facility];  Service: Urology;  Laterality: Bilateral;    Family History  Problem Relation Age of Onset  . Bladder Cancer Paternal Uncle   . Renal cancer Paternal Uncle     renal cell carcinoma  . Congestive Heart Failure Father   . Heart disease Father   . COPD Father   . Renal cancer Paternal Grandfather     renal call carcinoma  . Heart disease Brother     stents at 37 yo  . Parkinsonism Neg Hx     Social History   Social History  . Marital Status: Married    Spouse Name: [name]  . Number of Children: 0  . Years of Education: college   Occupational History  . disabled   .  Unemployed   Social History Main Topics  . Smoking status: Former Smoker -- 1.50 packs/day for 30 years    Types: Cigarettes    Quit date: 10/10/2007  . Smokeless tobacco: Never Used  . Alcohol Use: 0.6 oz/week    1 Cans of beer per week     Comment: rarely  . Drug Use: No  . Sexual Activity: Not on file   Other Topics Concern  . Not on file   Social History Narrative   Married.  [name])  No children. Patient has Best boy.   In process of getting disability for low back pain.   Right handed.   Caffeine- Coffee a pot but not every day.   The PMH, PSH, Social History, Family History, Medications, and allergies have been reviewed in [facility], and have been updated if relevant.   Review of Systems  Musculoskeletal: Negative.   All other systems reviewed and are negative.      Objective:    BP 122/80 mmHg  Pulse 53  Temp(Src) 98.1 F (36.7 C) (Tympanic)  Wt 293 lb (132.904 kg)  SpO2 98% Wt Readings from Last 3 Encounters:  07/26/15 293 lb (132.904 kg)  06/28/15 301 lb 4 oz (136.646 kg)  04/07/15 304 lb (137.893 kg)     Physical Exam  Constitutional: He is oriented to person, place, and time. He appears well-developed and well-nourished. No distress.  HENT:  Head: Normocephalic.    Eyes: Conjunctivae are normal.  Cardiovascular: Normal rate.   Pulmonary/Chest: Effort normal.  Musculoskeletal: Normal range of motion.  Neurological: He is alert and oriented to person, place, and time. No cranial nerve deficit.  Skin: Skin  is warm and dry.  Psychiatric: He has a normal mood and affect. His behavior is normal. Judgment and thought content normal.  Nursing note and vitals reviewed.         Assessment & Plan:   HLD (hyperlipidemia) No Follow-up on file.

## 2015-08-09 DIAGNOSIS — G5603 Carpal tunnel syndrome, bilateral upper limbs: Secondary | ICD-10-CM | POA: Diagnosis not present

## 2015-08-09 DIAGNOSIS — M5412 Radiculopathy, cervical region: Secondary | ICD-10-CM | POA: Diagnosis not present

## 2015-08-09 DIAGNOSIS — M5416 Radiculopathy, lumbar region: Secondary | ICD-10-CM | POA: Diagnosis not present

## 2015-08-25 ENCOUNTER — Encounter: Payer: Self-pay | Admitting: Family Medicine

## 2015-08-25 ENCOUNTER — Ambulatory Visit (INDEPENDENT_AMBULATORY_CARE_PROVIDER_SITE_OTHER): Payer: PPO | Admitting: Family Medicine

## 2015-08-25 VITALS — BP 124/76 | HR 61 | Temp 97.9°F | Wt 296.2 lb

## 2015-08-25 DIAGNOSIS — M25512 Pain in left shoulder: Secondary | ICD-10-CM

## 2015-08-25 MED ORDER — TRAMADOL HCL 50 MG PO TABS: ORAL_TABLET | ORAL | Status: DC

## 2015-08-25 NOTE — Progress Notes (Deleted)
   Subjective:   Patient ID: JAQUAVIUS BIERL, male    DOB: [birth year], 49 y.o.   MRN: [id number]  WEN GOICOECHEA is a pleasant 49 y.o. year old male who presents to clinic today with Back Pain  on 08/25/2015  HPI: ***  Review of Systems     Objective:    BP 124/76 mmHg  Pulse 61  Temp(Src) 97.9 F (36.6 C) (Oral)  Wt 296 lb 4 oz (134.378 kg)  SpO2 95%   Physical Exam        Assessment & Plan:   No diagnosis found. No Follow-up on file.

## 2015-08-25 NOTE — Progress Notes (Signed)
SUBJECTIVE: Corey Bell is a 49 y.o. male who complains of  left shoulder pain 2 week(s) ago. No known injury.  Overhead movements seem to be the worst.  Has robaxin that ortho gave him for right shoulder and neck.  This helps a little.   Symptoms have been chronic since that time. Prior history of related problems: no prior problems with this area in the past.  Current Outpatient Prescriptions on File Prior to Visit  Medication Sig Dispense Refill  . aspirin EC 81 MG tablet Take 1 tablet (81 mg total) by mouth daily.    Marland Kitchen buPROPion (WELLBUTRIN XL) 300 MG 24 hr tablet Take 300 mg by mouth daily.    . citalopram (CELEXA) 40 MG tablet TAKE ONE (1) TABLET EACH DAY 30 tablet 5  . diazepam (VALIUM) 5 MG tablet Take 1 tablet (5 mg total) by mouth every 6 (six) hours as needed for anxiety. 30 tablet 0  . erythromycin with ethanol (THERAMYCIN) 2 % external solution Apply topically daily.    Marland Kitchen ezetimibe (ZETIA) 10 MG tablet TAKE ONE (1) TABLET EACH DAY 100 tablet 3  . fexofenadine (ALLEGRA) 180 MG tablet Take 180 mg by mouth daily.    Marland Kitchen FREESTYLE LITE test strip USE AS DIRECTED 50 each 5  . gabapentin (NEURONTIN) 800 MG tablet Take 800 mg by mouth 4 (four) times daily.     Marland Kitchen glucose monitoring kit (FREESTYLE) monitoring kit 1 each by Does not apply route as needed for other. Use as directed 1 each 0  . ibuprofen (ADVIL,MOTRIN) 600 MG tablet Take 600 mg by mouth every 6 (six) hours as needed.    Marland Kitchen levothyroxine (SYNTHROID, LEVOTHROID) 75 MCG tablet TAKE ONE (1) TABLET EACH DAY 30 tablet 11  . lisinopril (PRINIVIL,ZESTRIL) 10 MG tablet Take 10 mg by mouth daily.    . metFORMIN (GLUCOPHAGE) 500 MG tablet TAKE ONE (1) TABLET EACH DAY WITH BREAKFAST 90 tablet 1  . metoprolol succinate (TOPROL-XL) 50 MG 24 hr tablet TAKE ONE (1) TABLET EACH DAY 30 tablet 5  . montelukast (SINGULAIR) 10 MG tablet TAKE ONE TABLET AT BEDTIME 30 tablet 5  . ONE TOUCH LANCETS MISC Check blood sugar once daily and as directed.  Dx. E11.9 200 each 1  . oxyCODONE-acetaminophen (PERCOCET/ROXICET) 5-325 MG per tablet Take 1 tablet by mouth every 6 (six) hours as needed for severe pain. 30 tablet 0  . ranitidine (ZANTAC) 150 MG tablet Take 150 mg by mouth 2 (two) times daily.    . saw palmetto 80 MG capsule Take by mouth.    . vitamin B-12 (CYANOCOBALAMIN) 1000 MCG tablet Take 1,000 mcg by mouth daily.     No current facility-administered medications on file prior to visit.    Allergies  Allergen Reactions  . Augmentin [Amoxicillin-Pot Clavulanate] Diarrhea  . Demerol [Meperidine]     "goes crazy and has the strength of 10 men"  . Eggs Or Egg-Derived Products Diarrhea and Nausea And Vomiting  . Statins Rash  . Sulfa Antibiotics Rash  . Sulfacetamide Sodium Rash    Past Medical History  Diagnosis Date  . Carpal tunnel syndrome, bilateral   . H/O acute prostatitis   . Unspecified essential hypertension   . Diverticulitis of colon   . Esophageal reflux   . Mixed hyperlipidemia   . 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 Seasonal allergies   . Aphthous ulcer 10-04-12    improving  . Chronic pain syndrome   .  Insomnia, unspecified   . Tympanic membrane perforation     history of  . Anxiety   . Lumbar degenerative disc disease   . Hypothyroidism   . COPD (chronic obstructive pulmonary disease) ([place])   . Sleep apnea     uses cpap  . Headache(784.0)     hx of migraines  . Depression     Past Surgical History  Procedure Laterality Date  . Lumbarsacral disc surgery      L5-S1  . Tympanostomy tube placement  1975  . Meniscus repair Left     torn meniscus  . Nesbit procedure N/A 08/22/2013    Procedure: 16 DOT PLACTATION;  Surgeon: Claybon Jabs, MD;  Location: [facility];  Service: Urology;  Laterality: N/A;  . Vasectomy Bilateral 08/22/2013    Procedure: VASECTOMY;  Surgeon: Claybon Jabs, MD;  Location: [facility];  Service: Urology;  Laterality: Bilateral;     Family History  Problem Relation Age of Onset  . Bladder Cancer Paternal Uncle   . Renal cancer Paternal Uncle     renal cell carcinoma  . Congestive Heart Failure Father   . Heart disease Father   . COPD Father   . Renal cancer Paternal Grandfather     renal call carcinoma  . Heart disease Brother     stents at 73 yo  . Parkinsonism Neg Hx     Social History   Social History  . Marital Status: Married    Spouse Name: [name]  . Number of Children: 0  . Years of Education: college   Occupational History  . disabled   .  Unemployed   Social History Main Topics  . Smoking status: Former Smoker -- 1.50 packs/day for 30 years    Types: Cigarettes    Quit date: 10/10/2007  . Smokeless tobacco: Never Used  . Alcohol Use: 0.6 oz/week    1 Cans of beer per week     Comment: rarely  . Drug Use: No  . Sexual Activity: Not on file   Other Topics Concern  . Not on file   Social History Narrative   Married.  [name])  No children. Patient has Best boy.   In process of getting disability for low back pain.   Right handed.   Caffeine- Coffee a pot but not every day.   The PMH, PSH, Social History, Family History, Medications, and allergies have been reviewed in [facility], and have been updated if relevant.  OBJECTIVE: BP 124/76 mmHg  Pulse 61  Temp(Src) 97.9 F (36.6 C) (Oral)  Wt 296 lb 4 oz (134.378 kg)  SpO2 95%  Vital signs as noted above. Appearance: alert, well appearing, and in no distress. Shoulder exam: reduced range of motion of rotator cuff, pos empty can, neg arch, ipsilateral elbow, wrist and hand exam is normal, contralateral shoulder exam is normal. X-ray: not indicated.  ASSESSMENT: Shoulder tendon injury and internal derangement  PLAN: rest the injured area as much as practical Given exercises to do from sports med advisor.  Tramdol rx refilled and given to pt.  See orders for this visit as documented in the electronic medical record.

## 2015-08-25 NOTE — Progress Notes (Signed)
Pre visit review using our clinic review tool, if applicable. No additional management support is needed unless otherwise documented below in the visit note. 

## 2015-09-21 ENCOUNTER — Encounter: Payer: Self-pay | Admitting: Family Medicine

## 2015-09-21 ENCOUNTER — Ambulatory Visit (INDEPENDENT_AMBULATORY_CARE_PROVIDER_SITE_OTHER): Payer: PPO | Admitting: Family Medicine

## 2015-09-21 VITALS — BP 114/60 | HR 51 | Temp 97.2°F | Wt 292.8 lb

## 2015-09-21 DIAGNOSIS — R002 Palpitations: Secondary | ICD-10-CM | POA: Insufficient documentation

## 2015-09-21 NOTE — Progress Notes (Signed)
Subjective:   Patient ID: Corey Bell, male    DOB: [birth year], 49 y.o.   MRN: [id number]  Corey Bell is a pleasant 49 y.o. year old male who presents to clinic today with Palpitations  on 09/21/2015  HPI:  Here for ? Heart skipping beats.   Does have a cardiologist, Dr. Aundra Dubin.  Last saw him for follow up on 12/10/14.  Note reviewed.  Feels like his heart has been "skipping beats" intertmittently for months.  Does not seem exertional.  No associated chest pain but sometimes has "chest pressure" with it. No new rx. Does not drink much caffeine. No supplements.  Has been losing weight intentionally.  Diabetes has been well controlled.  Has been euthyroid on current dose of synthroid. Lab Results  Component Value Date   TSH 1.01 02/16/2015    Current Outpatient Prescriptions on File Prior to Visit  Medication Sig Dispense Refill  . aspirin EC 81 MG tablet Take 1 tablet (81 mg total) by mouth daily.    Marland Kitchen buPROPion (WELLBUTRIN XL) 300 MG 24 hr tablet Take 300 mg by mouth daily.    . citalopram (CELEXA) 40 MG tablet TAKE ONE (1) TABLET EACH DAY 30 tablet 5  . diazepam (VALIUM) 5 MG tablet Take 1 tablet (5 mg total) by mouth every 6 (six) hours as needed for anxiety. 30 tablet 0  . erythromycin with ethanol (THERAMYCIN) 2 % external solution Apply topically daily.    Marland Kitchen ezetimibe (ZETIA) 10 MG tablet TAKE ONE (1) TABLET EACH DAY 100 tablet 3  . fexofenadine (ALLEGRA) 180 MG tablet Take 180 mg by mouth daily.    Marland Kitchen FREESTYLE LITE test strip USE AS DIRECTED 50 each 5  . gabapentin (NEURONTIN) 800 MG tablet Take 800 mg by mouth 4 (four) times daily.     Marland Kitchen glucose monitoring kit (FREESTYLE) monitoring kit 1 each by Does not apply route as needed for other. Use as directed 1 each 0  . ibuprofen (ADVIL,MOTRIN) 600 MG tablet Take 600 mg by mouth every 6 (six) hours as needed.    Marland Kitchen levothyroxine (SYNTHROID, LEVOTHROID) 75 MCG tablet TAKE ONE (1) TABLET EACH DAY 30 tablet 11  .  lisinopril (PRINIVIL,ZESTRIL) 10 MG tablet Take 10 mg by mouth daily.    . metFORMIN (GLUCOPHAGE) 500 MG tablet TAKE ONE (1) TABLET EACH DAY WITH BREAKFAST 90 tablet 1  . methocarbamol (ROBAXIN) 500 MG tablet Take 1,000 mg by mouth 4 (four) times daily.    . metoprolol succinate (TOPROL-XL) 50 MG 24 hr tablet TAKE ONE (1) TABLET EACH DAY 30 tablet 5  . montelukast (SINGULAIR) 10 MG tablet TAKE ONE TABLET AT BEDTIME 30 tablet 5  . ONE TOUCH LANCETS MISC Check blood sugar once daily and as directed. Dx. E11.9 200 each 1  . oxyCODONE-acetaminophen (PERCOCET/ROXICET) 5-325 MG per tablet Take 1 tablet by mouth every 6 (six) hours as needed for severe pain. 30 tablet 0  . ranitidine (ZANTAC) 150 MG tablet Take 150 mg by mouth 2 (two) times daily.    . saw palmetto 80 MG capsule Take by mouth.    . traMADol (ULTRAM) 50 MG tablet TAKE ONE TABLET BY MOUTH EVERY 8 HOURS AS NEEDED FOR MODERATE PAIN ORSEVERE PAIN 30 tablet 0  . vitamin B-12 (CYANOCOBALAMIN) 1000 MCG tablet Take 1,000 mcg by mouth daily.     No current facility-administered medications on file prior to visit.    Allergies  Allergen Reactions  . Augmentin [Amoxicillin-Pot Clavulanate]  Diarrhea  . Demerol [Meperidine]     "goes crazy and has the strength of 10 men"  . Eggs Or Egg-Derived Products Diarrhea and Nausea And Vomiting  . Statins Rash  . Sulfa Antibiotics Rash  . Sulfacetamide Sodium Rash    Past Medical History  Diagnosis Date  . Carpal tunnel syndrome, bilateral   . H/O acute prostatitis   . Unspecified essential hypertension   . Diverticulitis of colon   . Esophageal reflux   . Mixed hyperlipidemia   . 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 Seasonal allergies   . Aphthous ulcer 10-04-12    improving  . Chronic pain syndrome   . Insomnia, unspecified   . Tympanic membrane perforation     history of  . Anxiety   . Lumbar degenerative disc disease   . Hypothyroidism   . COPD (chronic obstructive pulmonary  disease) ([place])   . Sleep apnea     uses cpap  . Headache(784.0)     hx of migraines  . Depression     Past Surgical History  Procedure Laterality Date  . Lumbarsacral disc surgery      L5-S1  . Tympanostomy tube placement  1975  . Meniscus repair Left     torn meniscus  . Nesbit procedure N/A 08/22/2013    Procedure: 16 DOT PLACTATION;  Surgeon: Claybon Jabs, MD;  Location: [facility];  Service: Urology;  Laterality: N/A;  . Vasectomy Bilateral 08/22/2013    Procedure: VASECTOMY;  Surgeon: Claybon Jabs, MD;  Location: [facility];  Service: Urology;  Laterality: Bilateral;    Family History  Problem Relation Age of Onset  . Bladder Cancer Paternal Uncle   . Renal cancer Paternal Uncle     renal cell carcinoma  . Congestive Heart Failure Father   . Heart disease Father   . COPD Father   . Renal cancer Paternal Grandfather     renal call carcinoma  . Heart disease Brother     stents at 57 yo  . Parkinsonism Neg Hx     Social History   Social History  . Marital Status: Married    Spouse Name: [name]  . Number of Children: 0  . Years of Education: college   Occupational History  . disabled   .  Unemployed   Social History Main Topics  . Smoking status: Former Smoker -- 1.50 packs/day for 30 years    Types: Cigarettes    Quit date: 10/10/2007  . Smokeless tobacco: Never Used  . Alcohol Use: 0.6 oz/week    1 Cans of beer per week     Comment: rarely  . Drug Use: No  . Sexual Activity: Not on file   Other Topics Concern  . Not on file   Social History Narrative   Married.  [name])  No children. Patient has Best boy.   In process of getting disability for low back pain.   Right handed.   Caffeine- Coffee a pot but not every day.   The PMH, PSH, Social History, Family History, Medications, and allergies have been reviewed in [facility], and have been updated if relevant.   Review of Systems  Constitutional: Negative.     Eyes: Negative.   Respiratory: Positive for chest tightness.   Cardiovascular: Positive for palpitations. Negative for chest pain and leg swelling.  Musculoskeletal: Negative.   Skin: Negative.   Allergic/Immunologic: Negative.   Neurological: Negative.   Psychiatric/Behavioral:  Negative.   All other systems reviewed and are negative.      Objective:    BP 114/60 mmHg  Pulse 51  Temp(Src) 97.2 F (36.2 C) (Oral)  Wt 292 lb 12 oz (132.791 kg)  SpO2 97%  Wt Readings from Last 3 Encounters:  09/21/15 292 lb 12 oz (132.791 kg)  08/25/15 296 lb 4 oz (134.378 kg)  07/26/15 293 lb (132.904 kg)    Physical Exam  Constitutional: He is oriented to person, place, and time. He appears well-developed and well-nourished. No distress.  HENT:  Head: Normocephalic.  Cardiovascular: Normal rate, regular rhythm and normal heart sounds.   Pulmonary/Chest: Effort normal and breath sounds normal.  Musculoskeletal: Normal range of motion. He exhibits no edema.  Neurological: He is alert and oriented to person, place, and time.  Skin: Skin is warm and dry. He is not diaphoretic.  Psychiatric: He has a normal mood and affect. His behavior is normal. Judgment and thought content normal.  Nursing note and vitals reviewed.   General: NAD, obese Neck: thick, no JVD, no thyromegaly or thyroid nodule.  Lungs: Clear to auscultation bilaterally with normal respiratory effort. CV: Nondisplaced PMI. Heart regular S1/S2, no S3/S4, no murmur. No peripheral edema. No carotid bruit. Normal pedal pulses.  Abdomen: Soft, nontender, no hepatosplenomegaly, no distention.  Skin: Intact without lesions or rashes.  Neurologic: Alert and oriented x 3.  Psych: Normal affect. Extremities: No clubbing or cyanosis.       Assessment & Plan:   Palpitations - Plan: EKG 12-Lead No Follow-up on file.

## 2015-09-21 NOTE — Patient Instructions (Signed)
Great to see you. Happy Holidays. Please stop by to see Marion on your way out. 

## 2015-09-21 NOTE — Progress Notes (Signed)
Pre visit review using our clinic review tool, if applicable. No additional management support is needed unless otherwise documented below in the visit note. 

## 2015-09-21 NOTE — Assessment & Plan Note (Signed)
Intermittent. Exam and EKG reassuring- NSR, mild bradycardia. Refer back to cardiology for ? Holter monitor. The patient indicates understanding of these issues and agrees with the plan.

## 2015-09-22 ENCOUNTER — Ambulatory Visit: Payer: Self-pay | Admitting: Family Medicine

## 2015-09-23 ENCOUNTER — Encounter: Payer: Self-pay | Admitting: Cardiology

## 2015-09-23 ENCOUNTER — Ambulatory Visit (INDEPENDENT_AMBULATORY_CARE_PROVIDER_SITE_OTHER): Payer: PPO

## 2015-09-23 ENCOUNTER — Ambulatory Visit (INDEPENDENT_AMBULATORY_CARE_PROVIDER_SITE_OTHER): Payer: PPO | Admitting: Cardiology

## 2015-09-23 VITALS — BP 116/68 | HR 57 | Ht 74.0 in

## 2015-09-23 DIAGNOSIS — R002 Palpitations: Secondary | ICD-10-CM

## 2015-09-23 LAB — COMPREHENSIVE METABOLIC PANEL
ALBUMIN: 4.3 g/dL (ref 3.6–5.1)
ALK PHOS: 52 U/L (ref 40–115)
ALT: 77 U/L — AB (ref 9–46)
AST: 54 U/L — AB (ref 10–40)
BILIRUBIN TOTAL: 1.4 mg/dL — AB (ref 0.2–1.2)
BUN: 14 mg/dL (ref 7–25)
CALCIUM: 9.7 mg/dL (ref 8.6–10.3)
CO2: 28 mmol/L (ref 20–31)
Chloride: 101 mmol/L (ref 98–110)
Creat: 1.24 mg/dL (ref 0.60–1.35)
Glucose, Bld: 66 mg/dL (ref 65–99)
POTASSIUM: 4.6 mmol/L (ref 3.5–5.3)
Sodium: 139 mmol/L (ref 135–146)
Total Protein: 7 g/dL (ref 6.1–8.1)

## 2015-09-23 LAB — TSH: TSH: 1.521 u[IU]/mL (ref 0.350–4.500)

## 2015-09-23 NOTE — Progress Notes (Signed)
09/23/2015 Corey Bell   1965/12/20  [id number]  Primary Physician Arnette Norris, MD Primary Cardiologist: Dr. Aundra Dubin   Reason for Visit/CC: PCP referral for Palpitations.   HPI:  49 yo male with h/o COPD, OSA, hypothyroidism on synthroid, HTN, hyperlipidemia with strong family history of CAD. He used to smoke 2 ppd but quit smoking in 2012. He had a brother with an MI at 33 and another brother with PCI at 9. Father also had CAD. He had a normal Lexiscan Cardiolite in 9/14 and an echo showing normal EF and mild LVH. He is followed by Dr. Aundra Dubin.   He presents to clinic today after being referred by his PCP for evaluation of palpitations. Office EKG at PCP office on 09/21/15 showed NSR. He notes a long history of intermittent palpitations but has noticed increased frequency in the last several weeks. He denies associated CP, no significant dyspnea, dizziness, syncope/ near syncope. He admits to regular caffeine intake. He drinks on average 6 cups of coffee daily. He is on daily metoprolol and reports full daily compliance. His HR today is 57. BP is 116/68. He also reports daily compliance with Synthroid. He has not had a recent TSH.      Current Outpatient Prescriptions  Medication Sig Dispense Refill  . aspirin 325 MG tablet Take 650 mg by mouth daily.    Marland Kitchen buPROPion (WELLBUTRIN XL) 300 MG 24 hr tablet Take 300 mg by mouth daily.    . citalopram (CELEXA) 40 MG tablet Take 40 mg by mouth daily.    . diazepam (VALIUM) 5 MG tablet Take 1 tablet (5 mg total) by mouth every 6 (six) hours as needed for anxiety. 30 tablet 0  . erythromycin with ethanol (THERAMYCIN) 2 % external solution Apply topically daily.    Marland Kitchen ezetimibe (ZETIA) 10 MG tablet Take 10 mg by mouth daily.    . fexofenadine (ALLEGRA) 180 MG tablet Take 180 mg by mouth daily.    Marland Kitchen FREESTYLE LITE test strip USE AS DIRECTED 50 each 5  . gabapentin (NEURONTIN) 800 MG tablet Take 800 mg by mouth 4 (four) times daily.     Marland Kitchen  glucose monitoring kit (FREESTYLE) monitoring kit 1 each by Does not apply route as needed for other. Use as directed 1 each 0  . ibuprofen (ADVIL,MOTRIN) 600 MG tablet Take 600 mg by mouth daily.     Marland Kitchen levothyroxine (SYNTHROID, LEVOTHROID) 75 MCG tablet Take 75 mcg by mouth daily before breakfast.    . lisinopril (PRINIVIL,ZESTRIL) 10 MG tablet Take 10 mg by mouth daily.    . metFORMIN (GLUCOPHAGE) 500 MG tablet Take 500 mg by mouth daily with breakfast.    . methocarbamol (ROBAXIN) 500 MG tablet Take 1,000 mg by mouth every 6 (six) hours as needed for muscle spasms.     . metoprolol succinate (TOPROL-XL) 50 MG 24 hr tablet Take 50 mg by mouth daily. Take with or immediately following a meal.    . montelukast (SINGULAIR) 10 MG tablet Take 10 mg by mouth at bedtime.    . ONE TOUCH LANCETS MISC Check blood sugar once daily and as directed. Dx. E11.9 200 each 1  . oxyCODONE-acetaminophen (PERCOCET/ROXICET) 5-325 MG per tablet Take 1 tablet by mouth every 6 (six) hours as needed for severe pain. 30 tablet 0  . ranitidine (ZANTAC) 150 MG tablet Take 150 mg by mouth 2 (two) times daily.    . saw palmetto 80 MG capsule Take 80 mg by mouth  daily.     . traMADol (ULTRAM) 50 MG tablet TAKE ONE TABLET BY MOUTH EVERY 8 HOURS AS NEEDED FOR MODERATE PAIN ORSEVERE PAIN 30 tablet 0  . vitamin B-12 (CYANOCOBALAMIN) 1000 MCG tablet Take 1,000 mcg by mouth daily.     No current facility-administered medications for this visit.    Allergies  Allergen Reactions  . Augmentin [Amoxicillin-Pot Clavulanate] Diarrhea  . Demerol [Meperidine]     "goes crazy and has the strength of 10 men"  . Eggs Or Egg-Derived Products Diarrhea and Nausea And Vomiting  . Sulfamethoxazole Other (See Comments)    Blisters form in mouth  . Statins Rash  . Sulfa Antibiotics Rash  . Sulfacetamide Sodium Rash    Social History   Social History  . Marital Status: Married    Spouse Name: [name]  . Number of Children: 0  . Years  of Education: college   Occupational History  . disabled   .  Unemployed   Social History Main Topics  . Smoking status: Former Smoker -- 1.50 packs/day for 30 years    Types: Cigarettes    Quit date: 10/10/2007  . Smokeless tobacco: Never Used  . Alcohol Use: 0.6 oz/week    1 Cans of beer per week     Comment: rarely  . Drug Use: No  . Sexual Activity: Not on file   Other Topics Concern  . Not on file   Social History Narrative   Married.  [name])  No children. Patient has Best boy.   In process of getting disability for low back pain.   Right handed.   Caffeine- Coffee a pot but not every day.     Review of Systems: General: negative for chills, fever, night sweats or weight changes.  Cardiovascular: negative for chest pain, dyspnea on exertion, edema, orthopnea, palpitations, paroxysmal nocturnal dyspnea or shortness of breath Dermatological: negative for rash Respiratory: negative for cough or wheezing Urologic: negative for hematuria Abdominal: negative for nausea, vomiting, diarrhea, bright red blood per rectum, melena, or hematemesis Neurologic: negative for visual changes, syncope, or dizziness All other systems reviewed and are otherwise negative except as noted above.    Blood pressure 116/68, pulse 57, height _0  (1.88 m).  General appearance: alert, cooperative and no distress Neck: no carotid bruit and no JVD Lungs: clear to auscultation bilaterally Heart: regular rate and rhythm, S1, S2 normal, no murmur, click, rub or gallop Extremities: no LEE Pulses: 2+ and symmetric Skin: warm and dry Neurologic: Grossly normal  EKG NSR.   ASSESSMENT AND PLAN:   1. Palpitations: currently asymptomatic. Symptoms occur intermittently. No CP, dyspnea, syncope / near syncope. Physical exam is benign. Office EKG from PCP office 09/21/15 reviewed and showed NSR. He has a h/o hypothyroidism on synthroid. Will check a TSH to assess thyroid function. Will also  check a CBC and CMP. Will arrange for a 48 hr heart monitor. He is to continue metoprolol. Given resting bradycardia and soft BP, unable to increase dose at this time. Continue 50 mg daily. He was advised to reduce caffeine consumption.   PLAN  If heart monitor shows any significant findings, will call patient to notify to schedule f/u appointment to discuss further work-up/treament. If normal, can continue routine f/u with Dr. Aundra Dubin as directed.   Lyda Jester PA-C 09/23/2015 2:40 PM

## 2015-09-23 NOTE — Patient Instructions (Addendum)
Medication Instructions:  Your physician recommends that you continue on your current medications as directed. Please refer to the Current Medication list given to you today.   Labwork: TODAY:  TSH                 CBC W/DIFF                 CMP  Testing/Procedures: Your physician has recommended that you wear a holter monitor. Holter monitors are medical devices that record the heart's electrical activity. Doctors most often use these monitors to diagnose arrhythmias. Arrhythmias are problems with the speed or rhythm of the heartbeat. The monitor is a small, portable device. You can wear one while you do your normal daily activities. This is usually used to diagnose what is causing palpitations/syncope (passing out).    Follow-Up: Your physician recommends that you follow-up with Dr. Aundra Dubin as planned.  Any Other Special Instructions Will Be Listed Below (If Applicable).  Cardiac Event Monitoring A cardiac event monitor is a small recording device used to help detect abnormal heart rhythms (arrhythmias). The monitor is used to record heart rhythm when noticeable symptoms such as the following occur:  Fast heartbeats (palpitations), such as heart racing or fluttering.  Dizziness.  Fainting or light-headedness.  Unexplained weakness. The monitor is wired to two electrodes placed on your chest. Electrodes are flat, sticky disks that attach to your skin. The monitor can be worn for up to 30 days. You will wear the monitor at all times, except when bathing.  HOW TO USE YOUR CARDIAC EVENT MONITOR A technician will prepare your chest for the electrode placement. The technician will show you how to place the electrodes, how to work the monitor, and how to replace the batteries. Take time to practice using the monitor before you leave the office. Make sure you understand how to send the information from the monitor to your health care provider. This requires a telephone with a landline, not a  cell phone. You need to:  Wear your monitor at all times, except when you are in water:  Do not get the monitor wet.  Take the monitor off when bathing. Do not swim or use a hot tub with it on.  Keep your skin clean. Do not put body lotion or moisturizer on your chest.  Change the electrodes daily or any time they stop sticking to your skin. You might need to use tape to keep them on.  It is possible that your skin under the electrodes could become irritated. To keep this from happening, try to put the electrodes in slightly different places on your chest. However, they must remain in the area under your left breast and in the upper right section of your chest.  Make sure the monitor is safely clipped to your clothing or in a location close to your body that your health care provider recommends.  Press the button to record when you feel symptoms of heart trouble, such as dizziness, weakness, light-headedness, palpitations, thumping, shortness of breath, unexplained weakness, or a fluttering or racing heart. The monitor is always on and records what happened slightly before you pressed the button, so do not worry about being too late to get good information.  Keep a diary of your activities, such as walking, doing chores, and taking medicine. It is especially important to note what you were doing when you pushed the button to record your symptoms. This will help your health care provider determine what  might be contributing to your symptoms. The information stored in your monitor will be reviewed by your health care provider alongside your diary entries.  Send the recorded information as recommended by your health care provider. It is important to understand that it will take some time for your health care provider to process the results.  Change the batteries as recommended by your health care provider. SEEK IMMEDIATE MEDICAL CARE IF:   You have chest pain.  You have extreme difficulty  breathing or shortness of breath.  You develop a very fast heartbeat that persists.  You develop dizziness that does not go away.  You faint or constantly feel you are about to faint.   This information is not intended to replace advice given to you by your health care provider. Make sure you discuss any questions you have with your health care provider.   Document Released: 07/04/2008 Document Revised: 10/16/2014 Document Reviewed: 03/24/2013 Elsevier Interactive Patient Education [employer].    If you need a refill on your cardiac medications before your next appointment, please call your pharmacy.

## 2015-09-24 ENCOUNTER — Telehealth: Payer: Self-pay

## 2015-09-24 LAB — CBC WITH DIFFERENTIAL/PLATELET
Basophils Absolute: 0.1 10*3/uL (ref 0.0–0.1)
Basophils Relative: 1 % (ref 0–1)
EOS PCT: 5 % (ref 0–5)
Eosinophils Absolute: 0.3 10*3/uL (ref 0.0–0.7)
HEMATOCRIT: 44.9 % (ref 39.0–52.0)
Hemoglobin: 15.4 g/dL (ref 13.0–17.0)
LYMPHS PCT: 39 % (ref 12–46)
Lymphs Abs: 2.4 10*3/uL (ref 0.7–4.0)
MCH: 31.5 pg (ref 26.0–34.0)
MCHC: 34.3 g/dL (ref 30.0–36.0)
MCV: 91.8 fL (ref 78.0–100.0)
MONO ABS: 0.6 10*3/uL (ref 0.1–1.0)
MPV: 9.9 fL (ref 8.6–12.4)
Monocytes Relative: 9 % (ref 3–12)
Neutro Abs: 2.9 10*3/uL (ref 1.7–7.7)
Neutrophils Relative %: 46 % (ref 43–77)
Platelets: 231 10*3/uL (ref 150–400)
RBC: 4.89 MIL/uL (ref 4.22–5.81)
RDW: 13.6 % (ref 11.5–15.5)
WBC: 6.2 10*3/uL (ref 4.0–10.5)

## 2015-09-24 NOTE — Telephone Encounter (Signed)
Pt left v/m; pt was seen by Ellen Henri PA and pt was advised liver enzymes were slightly elevated; per result note 12/15/*16 pt to hold zetia and reck LFT's and fasting LP in 1 month. This is FYI to Dr Deborra Medina. Pt does want cb.

## 2015-09-30 ENCOUNTER — Ambulatory Visit (INDEPENDENT_AMBULATORY_CARE_PROVIDER_SITE_OTHER): Payer: PPO | Admitting: Family Medicine

## 2015-09-30 ENCOUNTER — Encounter: Payer: Self-pay | Admitting: Family Medicine

## 2015-09-30 VITALS — BP 118/66 | HR 57 | Temp 98.0°F | Wt 297.5 lb

## 2015-09-30 DIAGNOSIS — G4733 Obstructive sleep apnea (adult) (pediatric): Secondary | ICD-10-CM | POA: Diagnosis not present

## 2015-09-30 NOTE — Progress Notes (Signed)
Subjective:   Patient ID: Corey Bell, male    DOB: December 24, 1965, 49 y.o.   MRN: [id number]  Corey Bell is a pleasant 49 y.o. year old male who presents to clinic today with Follow-up  on 09/30/2015  HPI:  Needs new parts ordered for his CPAP- mask, tubing, cushions, etc.  No longer seeing the allergist in [place] who originally prescribed them.  [name] bring in packaging of what exactly he needs ordered.  Feels this mask size fits well.  Current Outpatient Prescriptions on File Prior to Visit  Medication Sig Dispense Refill  . aspirin 325 MG tablet Take 650 mg by mouth daily.    Marland Kitchen buPROPion (WELLBUTRIN XL) 300 MG 24 hr tablet Take 300 mg by mouth daily.    . citalopram (CELEXA) 40 MG tablet Take 40 mg by mouth daily.    . diazepam (VALIUM) 5 MG tablet Take 1 tablet (5 mg total) by mouth every 6 (six) hours as needed for anxiety. 30 tablet 0  . erythromycin with ethanol (THERAMYCIN) 2 % external solution Apply topically daily.    Marland Kitchen ezetimibe (ZETIA) 10 MG tablet Take 10 mg by mouth daily.    . fexofenadine (ALLEGRA) 180 MG tablet Take 180 mg by mouth daily.    Marland Kitchen FREESTYLE LITE test strip USE AS DIRECTED 50 each 5  . gabapentin (NEURONTIN) 800 MG tablet Take 800 mg by mouth 4 (four) times daily.     Marland Kitchen glucose monitoring kit (FREESTYLE) monitoring kit 1 each by Does not apply route as needed for other. Use as directed 1 each 0  . ibuprofen (ADVIL,MOTRIN) 600 MG tablet Take 600 mg by mouth daily.     Marland Kitchen levothyroxine (SYNTHROID, LEVOTHROID) 75 MCG tablet Take 75 mcg by mouth daily before breakfast.    . lisinopril (PRINIVIL,ZESTRIL) 10 MG tablet Take 10 mg by mouth daily.    . metFORMIN (GLUCOPHAGE) 500 MG tablet Take 500 mg by mouth daily with breakfast.    . methocarbamol (ROBAXIN) 500 MG tablet Take 1,000 mg by mouth every 6 (six) hours as needed for muscle spasms.     . metoprolol succinate (TOPROL-XL) 50 MG 24 hr tablet Take 50 mg by mouth daily. Take with or  immediately following a meal.    . montelukast (SINGULAIR) 10 MG tablet Take 10 mg by mouth at bedtime.    . ONE TOUCH LANCETS MISC Check blood sugar once daily and as directed. Dx. E11.9 200 each 1  . oxyCODONE-acetaminophen (PERCOCET/ROXICET) 5-325 MG per tablet Take 1 tablet by mouth every 6 (six) hours as needed for severe pain. 30 tablet 0  . ranitidine (ZANTAC) 150 MG tablet Take 150 mg by mouth 2 (two) times daily.    . saw palmetto 80 MG capsule Take 80 mg by mouth daily.     . traMADol (ULTRAM) 50 MG tablet TAKE ONE TABLET BY MOUTH EVERY 8 HOURS AS NEEDED FOR MODERATE PAIN ORSEVERE PAIN 30 tablet 0  . vitamin B-12 (CYANOCOBALAMIN) 1000 MCG tablet Take 1,000 mcg by mouth daily.     No current facility-administered medications on file prior to visit.    Allergies  Allergen Reactions  . Augmentin [Amoxicillin-Pot Clavulanate] Diarrhea  . Demerol [Meperidine]     "goes crazy and has the strength of 10 men"  . Eggs Or Egg-Derived Products Diarrhea and Nausea And Vomiting  . Sulfamethoxazole Other (See Comments)    Blisters form in mouth  . Statins Rash  . Sulfa Antibiotics  Rash  . Sulfacetamide Sodium Rash    Past Medical History  Diagnosis Date  . Carpal tunnel syndrome, bilateral   . H/O acute prostatitis   . Unspecified essential hypertension   . Diverticulitis of colon   . Esophageal reflux   . Mixed hyperlipidemia   . 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 Seasonal allergies   . Aphthous ulcer 10-04-12    improving  . Chronic pain syndrome   . Insomnia, unspecified   . Tympanic membrane perforation     history of  . Anxiety   . Lumbar degenerative disc disease   . Hypothyroidism   . COPD (chronic obstructive pulmonary disease) ([place])   . Sleep apnea     uses cpap  . Headache(784.0)     hx of migraines  . Depression     Past Surgical History  Procedure Laterality Date  . Lumbarsacral disc surgery      L5-S1  . Tympanostomy tube placement  1975  .  Meniscus repair Left     torn meniscus  . Nesbit procedure N/A 08/22/2013    Procedure: 16 DOT PLACTATION;  Surgeon: Claybon Jabs, MD;  Location: [facility];  Service: Urology;  Laterality: N/A;  . Vasectomy Bilateral 08/22/2013    Procedure: VASECTOMY;  Surgeon: Claybon Jabs, MD;  Location: [facility];  Service: Urology;  Laterality: Bilateral;    Family History  Problem Relation Age of Onset  . Bladder Cancer Paternal Uncle   . Renal cancer Paternal Uncle     renal cell carcinoma  . Congestive Heart Failure Father   . Heart disease Father   . COPD Father   . Renal cancer Paternal Grandfather     renal call carcinoma  . Heart disease Brother     stents at 31 yo  . Parkinsonism Neg Hx     Social History   Social History  . Marital Status: Married    Spouse Name: [name]  . Number of Children: 0  . Years of Education: college   Occupational History  . disabled   .  Unemployed   Social History Main Topics  . Smoking status: Former Smoker -- 1.50 packs/day for 30 years    Types: Cigarettes    Quit date: 10/10/2007  . Smokeless tobacco: Never Used  . Alcohol Use: 0.6 oz/week    1 Cans of beer per week     Comment: rarely  . Drug Use: No  . Sexual Activity: Not on file   Other Topics Concern  . Not on file   Social History Narrative   Married.  [name])  No children. Patient has Best boy.   In process of getting disability for low back pain.   Right handed.   Caffeine- Coffee a pot but not every day.   The PMH, PSH, Social History, Family History, Medications, and allergies have been reviewed in [facility], and have been updated if relevant.   Review of Systems  Constitutional: Negative.   Respiratory: Negative.   Cardiovascular: Negative.   Musculoskeletal: Negative.   Skin: Negative.   Neurological: Negative.   Psychiatric/Behavioral: Negative.   All other systems reviewed and are negative.      Objective:    BP  118/66 mmHg  Pulse 57  Temp(Src) 98 F (36.7 C) (Oral)  Wt 297 lb 8 oz (134.945 kg)  SpO2 96%   Physical Exam  Constitutional: He is oriented to person, place, and time.  He appears well-developed and well-nourished. No distress.  HENT:  Head: Normocephalic.  Eyes: Conjunctivae are normal.  Cardiovascular: Normal rate.   Pulmonary/Chest: Effort normal.  Neurological: He is alert and oriented to person, place, and time. No cranial nerve deficit.  Skin: Skin is warm and dry. He is not diaphoretic.  Psychiatric: He has a normal mood and affect. His behavior is normal. Judgment and thought content normal.  Nursing note and vitals reviewed.         Assessment & Plan:   Obstructive apnea No Follow-up on file.

## 2015-09-30 NOTE — Progress Notes (Signed)
Pre visit review using our clinic review tool, if applicable. No additional management support is needed unless otherwise documented below in the visit note. 

## 2015-09-30 NOTE — Assessment & Plan Note (Signed)
3 rxs written and given to pt. Advised that he will need referral to pulmonary in near future to see if his CPAP fits or settings need to be changed, etc. The patient indicates understanding of these issues and agrees with the plan.

## 2015-10-06 ENCOUNTER — Telehealth: Payer: Self-pay | Admitting: *Deleted

## 2015-10-06 ENCOUNTER — Telehealth: Payer: Self-pay | Admitting: Family Medicine

## 2015-10-06 DIAGNOSIS — E785 Hyperlipidemia, unspecified: Secondary | ICD-10-CM

## 2015-10-06 NOTE — Telephone Encounter (Signed)
Spoke with pt and informed him of recommendations per Lyda Jester, PA-C. Scheduled pt for labs on 10/25/14. Pt verbalized understanding and was in agreement with this plan.

## 2015-10-06 NOTE — Telephone Encounter (Signed)
Pt called wanting Dr. Deborra Medina to be aware and to advise on what to do. [facility] has been in contact with him stating they are worried about his liver function results and is wanting him to have more labs drawn. Pt is fine with that, but is worried about them taking him off any medication/changing his medication without you being notified. You can reach at at [phone] for further information.

## 2015-10-06 NOTE — Telephone Encounter (Signed)
Spoke to pt and advised per Dr Aron. Pt verbally expressed understanding.  

## 2015-10-06 NOTE — Telephone Encounter (Signed)
Just have him call me if they make any changes.

## 2015-10-06 NOTE — Telephone Encounter (Signed)
-----   Message from Consuelo Pandy, [place] sent at 10/06/2015  4:08 PM EST ----- Ok to continue for now, but still recommend rechecking LFTs in 1 month. If further increase, then would again recommend holding Zetia.   ----- Message -----    From: Loren Racer, LPN    Sent: [id number]   2:46 PM      To: Brittainy Erie Noe, PA-C  Informed pt of lab results. Pt hesitant to stop Zetia because he doesn't want his cholesterol/triglycerides to go back up. Pt states that his liver enzymes were elevated off and on for awhile and that Dr. Deborra Medina has been keeping at eye on those. Advised pt that I would route this information to Lyda Jester, PA-C for review and advisement. Pt will call PCP and have him look over lab work as well.

## 2015-10-11 ENCOUNTER — Other Ambulatory Visit: Payer: Self-pay | Admitting: Family Medicine

## 2015-10-13 DIAGNOSIS — G4733 Obstructive sleep apnea (adult) (pediatric): Secondary | ICD-10-CM | POA: Diagnosis not present

## 2015-10-25 ENCOUNTER — Other Ambulatory Visit: Payer: Self-pay | Admitting: Family Medicine

## 2015-10-25 NOTE — Telephone Encounter (Signed)
Rx called in to requested pharmacy 

## 2015-10-25 NOTE — Telephone Encounter (Signed)
last f/u 08/2015

## 2015-10-26 ENCOUNTER — Other Ambulatory Visit (INDEPENDENT_AMBULATORY_CARE_PROVIDER_SITE_OTHER): Payer: PPO | Admitting: *Deleted

## 2015-10-26 DIAGNOSIS — E785 Hyperlipidemia, unspecified: Secondary | ICD-10-CM

## 2015-10-26 DIAGNOSIS — M791 Myalgia: Secondary | ICD-10-CM | POA: Diagnosis not present

## 2015-10-26 DIAGNOSIS — M5416 Radiculopathy, lumbar region: Secondary | ICD-10-CM | POA: Diagnosis not present

## 2015-10-26 LAB — HEPATIC FUNCTION PANEL
ALT: 70 U/L — AB (ref 9–46)
AST: 40 U/L (ref 10–40)
Albumin: 4.3 g/dL (ref 3.6–5.1)
Alkaline Phosphatase: 42 U/L (ref 40–115)
BILIRUBIN DIRECT: 0.2 mg/dL (ref <=0.2)
BILIRUBIN INDIRECT: 1 mg/dL (ref 0.2–1.2)
TOTAL PROTEIN: 7.1 g/dL (ref 6.1–8.1)
Total Bilirubin: 1.2 mg/dL (ref 0.2–1.2)

## 2015-10-26 LAB — LIPID PANEL
CHOLESTEROL: 143 mg/dL (ref 125–200)
HDL: 29 mg/dL — ABNORMAL LOW (ref >=40)
LDL Cholesterol: 66 mg/dL (ref <130)
TRIGLYCERIDES: 239 mg/dL — AB (ref <150)
Total CHOL/HDL Ratio: 4.9 Ratio (ref <=5.0)
VLDL: 48 mg/dL — AB (ref <30)

## 2015-11-02 ENCOUNTER — Telehealth: Payer: Self-pay | Admitting: Cardiology

## 2015-11-02 NOTE — Telephone Encounter (Signed)
I discussed results with pt, pt aware results have not been reviewed by provider.

## 2015-11-02 NOTE — Telephone Encounter (Signed)
New problem   Pt want to know results of labs he had done 1.17.17.

## 2015-11-03 ENCOUNTER — Other Ambulatory Visit: Payer: Self-pay | Admitting: Family Medicine

## 2015-11-19 NOTE — Telephone Encounter (Signed)
I will forward to [place] for review of lab done 10/26/15.

## 2015-11-29 ENCOUNTER — Telehealth: Payer: Self-pay | Admitting: Cardiology

## 2015-11-29 ENCOUNTER — Encounter: Payer: Self-pay | Admitting: *Deleted

## 2015-11-29 NOTE — Telephone Encounter (Signed)
Informed pt of lab results. Pt verbalized understanding. 

## 2015-11-29 NOTE — Telephone Encounter (Signed)
New message     Returning call back to nurse from Friday

## 2015-12-01 ENCOUNTER — Other Ambulatory Visit: Payer: Self-pay | Admitting: Family Medicine

## 2015-12-06 ENCOUNTER — Ambulatory Visit (INDEPENDENT_AMBULATORY_CARE_PROVIDER_SITE_OTHER): Payer: PPO | Admitting: Family Medicine

## 2015-12-06 ENCOUNTER — Encounter: Payer: Self-pay | Admitting: *Deleted

## 2015-12-06 ENCOUNTER — Encounter: Payer: Self-pay | Admitting: Family Medicine

## 2015-12-06 VITALS — BP 128/74 | HR 51 | Temp 98.0°F | Wt 290.8 lb

## 2015-12-06 DIAGNOSIS — I10 Essential (primary) hypertension: Secondary | ICD-10-CM | POA: Diagnosis not present

## 2015-12-06 DIAGNOSIS — E785 Hyperlipidemia, unspecified: Secondary | ICD-10-CM | POA: Diagnosis not present

## 2015-12-06 DIAGNOSIS — G4733 Obstructive sleep apnea (adult) (pediatric): Secondary | ICD-10-CM

## 2015-12-06 DIAGNOSIS — E119 Type 2 diabetes mellitus without complications: Secondary | ICD-10-CM

## 2015-12-06 DIAGNOSIS — E038 Other specified hypothyroidism: Secondary | ICD-10-CM | POA: Diagnosis not present

## 2015-12-06 DIAGNOSIS — R7989 Other specified abnormal findings of blood chemistry: Secondary | ICD-10-CM

## 2015-12-06 DIAGNOSIS — R945 Abnormal results of liver function studies: Secondary | ICD-10-CM

## 2015-12-06 DIAGNOSIS — D229 Melanocytic nevi, unspecified: Secondary | ICD-10-CM | POA: Insufficient documentation

## 2015-12-06 DIAGNOSIS — D239 Other benign neoplasm of skin, unspecified: Secondary | ICD-10-CM

## 2015-12-06 LAB — COMPREHENSIVE METABOLIC PANEL
ALT: 64 U/L — AB (ref 0–53)
AST: 35 U/L (ref 0–37)
Albumin: 4.4 g/dL (ref 3.5–5.2)
Alkaline Phosphatase: 45 U/L (ref 39–117)
BILIRUBIN TOTAL: 1.2 mg/dL (ref 0.2–1.2)
BUN: 17 mg/dL (ref 6–23)
CALCIUM: 9.5 mg/dL (ref 8.4–10.5)
CO2: 30 meq/L (ref 19–32)
Chloride: 103 mEq/L (ref 96–112)
Creatinine, Ser: 1.22 mg/dL (ref 0.40–1.50)
GFR: 67.07 mL/min (ref >60.00)
GLUCOSE: 139 mg/dL — AB (ref 70–99)
Potassium: 4.8 mEq/L (ref 3.5–5.1)
SODIUM: 138 meq/L (ref 135–145)
Total Protein: 7.4 g/dL (ref 6.0–8.3)

## 2015-12-06 LAB — T4, FREE: FREE T4: 0.76 ng/dL (ref 0.60–1.60)

## 2015-12-06 LAB — HEMOGLOBIN A1C: Hgb A1c MFr Bld: 6.2 % (ref 4.6–6.5)

## 2015-12-06 MED ORDER — TRAMADOL HCL 50 MG PO TABS: ORAL_TABLET | ORAL | Status: DC

## 2015-12-06 NOTE — Progress Notes (Signed)
Subjective:   Patient ID: Corey Bell, male    DOB: 1966/03/29, 50 y.o.   MRN: [id number]  Corey Bell is a pleasant 50 y.o. year old male who presents with his wife, Corey Bell, to clinic today with Follow-up  on 12/06/2015  HPI: DM- diagnosed in August 2015.   Lab Results  Component Value Date   HGBA1C 6.2 06/28/2015   No Family h/o DM.  Has been well controlled.  Brings his log in.  Checking it twice daily.  Has been working on his weight.  Cutting back on snacking.  Started on Metformin 500 mg daily on 06/03/14.   Has been intolerant to multiple statins but he is on zetia- LDL at goal for diabetic. Lab Results  Component Value Date   CHOL 143 10/26/2015   HDL 29* 10/26/2015   LDLCALC 66 10/26/2015   LDLDIRECT 93.0 04/07/2015   TRIG 239* 10/26/2015   CHOLHDL 4.9 10/26/2015    Chronic pain- uses tramadol sparingly.  Sometimes goes a week without needing it.  Asking for refill today.   Hypothyroidism- taking synthroid 75 mcg daily. Denies any symptoms of hypo or hyperthyroidism other than persistent fatigue.  CPAP does seem to help.  Lab Results  Component Value Date   TSH 1.521 09/23/2015   Notice a mole on his left lower arm- changing in size and color for past several weeks.  Current Outpatient Prescriptions on File Prior to Visit  Medication Sig Dispense Refill  . aspirin 325 MG tablet Take 650 mg by mouth daily.    Marland Kitchen buPROPion (WELLBUTRIN XL) 300 MG 24 hr tablet Take 300 mg by mouth daily.    . citalopram (CELEXA) 40 MG tablet Take 40 mg by mouth daily.    . diazepam (VALIUM) 5 MG tablet Take 1 tablet (5 mg total) by mouth every 6 (six) hours as needed for anxiety. 30 tablet 0  . erythromycin with ethanol (THERAMYCIN) 2 % external solution Apply topically daily.    Marland Kitchen ezetimibe (ZETIA) 10 MG tablet Take 10 mg by mouth daily.    . fexofenadine (ALLEGRA) 180 MG tablet Take 180 mg by mouth daily.    Marland Kitchen FREESTYLE LITE test strip USE AS DIRECTED 50 each 5  .  gabapentin (NEURONTIN) 800 MG tablet Take 800 mg by mouth 4 (four) times daily.     Marland Kitchen glucose monitoring kit (FREESTYLE) monitoring kit 1 each by Does not apply route as needed for other. Use as directed 1 each 0  . ibuprofen (ADVIL,MOTRIN) 600 MG tablet Take 600 mg by mouth daily.     Marland Kitchen levothyroxine (SYNTHROID, LEVOTHROID) 75 MCG tablet Take 75 mcg by mouth daily before breakfast.    . lisinopril (PRINIVIL,ZESTRIL) 10 MG tablet TAKE ONE (1) TABLET EACH DAY 30 tablet 5  . metFORMIN (GLUCOPHAGE) 500 MG tablet Take 500 mg by mouth daily with breakfast.    . methocarbamol (ROBAXIN) 500 MG tablet Take 1,000 mg by mouth every 6 (six) hours as needed for muscle spasms.     . metoprolol succinate (TOPROL-XL) 50 MG 24 hr tablet TAKE ONE (1) TABLET EACH DAY 30 tablet 11  . montelukast (SINGULAIR) 10 MG tablet TAKE 1 TABLET BY MOUTH EVERY NIGHT AT BEDTIME. 30 tablet 5  . ONE TOUCH LANCETS MISC Check blood sugar once daily and as directed. Dx. E11.9 200 each 1  . oxyCODONE-acetaminophen (PERCOCET/ROXICET) 5-325 MG per tablet Take 1 tablet by mouth every 6 (six) hours as needed for severe pain. [place]  tablet 0  . ranitidine (ZANTAC) 150 MG tablet Take 150 mg by mouth 2 (two) times daily.    . saw palmetto 80 MG capsule Take 80 mg by mouth daily.     . traMADol (ULTRAM) 50 MG tablet TAKE 1 TABLET BY MOUTH EVERY 8 HOURS AS NEEDED FOR MODERATE TO SEVEREPAIN 30 tablet 0  . vitamin B-12 (CYANOCOBALAMIN) 1000 MCG tablet Take 1,000 mcg by mouth daily.     No current facility-administered medications on file prior to visit.    Allergies  Allergen Reactions  . Augmentin [Amoxicillin-Pot Clavulanate] Diarrhea  . Demerol [Meperidine]     "goes crazy and has the strength of 10 men"  . Eggs Or Egg-Derived Products Diarrhea and Nausea And Vomiting  . Sulfamethoxazole Other (See Comments)    Blisters form in mouth  . Statins Rash  . Sulfa Antibiotics Rash  . Sulfacetamide Sodium Rash    Past Medical History    Diagnosis Date  . Carpal tunnel syndrome, bilateral   . H/O acute prostatitis   . Unspecified essential hypertension   . Diverticulitis of colon   . Esophageal reflux   . Mixed hyperlipidemia   . 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 Seasonal allergies   . Aphthous ulcer 10-04-12    improving  . Chronic pain syndrome   . Insomnia, unspecified   . Tympanic membrane perforation     history of  . Anxiety   . Lumbar degenerative disc disease   . Hypothyroidism   . COPD (chronic obstructive pulmonary disease) ([place])   . Sleep apnea     uses cpap  . Headache(784.0)     hx of migraines  . Depression     Past Surgical History  Procedure Laterality Date  . Lumbarsacral disc surgery      L5-S1  . Tympanostomy tube placement  1975  . Meniscus repair Left     torn meniscus  . Nesbit procedure N/A 08/22/2013    Procedure: 16 DOT PLACTATION;  Surgeon: Claybon Jabs, MD;  Location: [facility];  Service: Urology;  Laterality: N/A;  . Vasectomy Bilateral 08/22/2013    Procedure: VASECTOMY;  Surgeon: Claybon Jabs, MD;  Location[facility];  Service: Urology;  Laterality: Bilateral;    Family History  Problem Relation Age of Onset  . Bladder Cancer Paternal Uncle   . Renal cancer Paternal Uncle     renal cell carcinoma  . Congestive Heart Failure Father   . Heart disease Father   . COPD Father   . Renal cancer Paternal Grandfather     renal call carcinoma  . Heart disease Brother     stents at 67 yo  . Parkinsonism Neg Hx     Social History   Social History  . Marital Status: Married    Spouse Name: Corey Bell  . Number of Children: 0  . Years of Education: college   Occupational History  . disabled   .  Unemployed   Social History Main Topics  . Smoking status: Former Smoker -- 1.50 packs/day for 30 years    Types: Cigarettes    Quit date: 10/10/2007  . Smokeless tobacco: Never Used  . Alcohol Use: 0.6 oz/week    1 Cans of  beer per week     Comment: rarely  . Drug Use: No  . Sexual Activity: Not on file   Other Topics Concern  . Not on file   Social  History Narrative   Married.  Corey Bell)  No children. Patient has Best boy.   In process of getting disability for low back pain.   Right handed.   Caffeine- Coffee a pot but not every day.   The PMH, PSH, Social History, Family History, Medications, and allergies have been reviewed in [facility], and have been updated if relevant.    Review of Systems  Constitutional: Positive for fatigue.  HENT: Negative.   Eyes: Negative.   Respiratory: Negative.   Cardiovascular: Negative.   Gastrointestinal: Negative.   Genitourinary: Negative.   Musculoskeletal: Negative.   Skin: Positive for color change.  Hematological: Negative.   Psychiatric/Behavioral: Negative.   All other systems reviewed and are negative.      Objective:    BP 128/74 mmHg  Pulse 51  Temp(Src) 98 F (36.7 C) (Oral)  Wt 290 lb 12 oz (131.883 kg)  SpO2 97%  Wt Readings from Last 3 Encounters:  12/06/15 290 lb 12 oz (131.883 kg)  09/30/15 297 lb 8 oz (134.945 kg)  09/21/15 292 lb 12 oz (132.791 kg)    Physical Exam  Constitutional: He is oriented to person, place, and time. He appears well-developed and well-nourished. No distress.  HENT:  Head: Normocephalic and atraumatic.  Eyes: Conjunctivae are normal.  Neck: Normal range of motion.  Cardiovascular: Normal rate.   Pulmonary/Chest: Effort normal.  Abdominal: Soft.  Musculoskeletal: He exhibits no edema.  Neurological: He is alert and oriented to person, place, and time. No cranial nerve deficit.  Skin: Skin is warm and dry.     Psychiatric: He has a normal mood and affect. His behavior is normal. Judgment and thought content normal.  Nursing note and vitals reviewed.         Assessment & Plan:   Other specified hypothyroidism  Diabetes mellitus, new onset ([place])  HLD (hyperlipidemia)  Essential  hypertension  Hyperlipidemia No Follow-up on file.

## 2015-12-06 NOTE — Patient Instructions (Signed)
Great to see you. We will call you with your lab results and dermatology referral.

## 2015-12-06 NOTE — Assessment & Plan Note (Signed)
Continue current dose of synthroid. TSH today.

## 2015-12-06 NOTE — Progress Notes (Signed)
Pre visit review using our clinic review tool, if applicable. No additional management support is needed unless otherwise documented below in the visit note. 

## 2015-12-06 NOTE — Assessment & Plan Note (Signed)
Well controlled. Also followed by cardiology. No changes made today.

## 2015-12-06 NOTE — Assessment & Plan Note (Signed)
Has been well controlled. Recheck a1c today. No changes made to rxs today.

## 2015-12-06 NOTE — Assessment & Plan Note (Signed)
Derm referral placed

## 2015-12-06 NOTE — Assessment & Plan Note (Signed)
Compliant with CPAP 

## 2015-12-06 NOTE — Assessment & Plan Note (Signed)
Recheck LFTs today 

## 2015-12-07 ENCOUNTER — Ambulatory Visit: Payer: Self-pay | Admitting: Family Medicine

## 2015-12-13 ENCOUNTER — Other Ambulatory Visit: Payer: Self-pay | Admitting: Family Medicine

## 2015-12-14 DIAGNOSIS — D225 Melanocytic nevi of trunk: Secondary | ICD-10-CM | POA: Diagnosis not present

## 2015-12-14 DIAGNOSIS — L570 Actinic keratosis: Secondary | ICD-10-CM | POA: Diagnosis not present

## 2015-12-14 DIAGNOSIS — D0462 Carcinoma in situ of skin of left upper limb, including shoulder: Secondary | ICD-10-CM | POA: Diagnosis not present

## 2015-12-14 DIAGNOSIS — X32XXXA Exposure to sunlight, initial encounter: Secondary | ICD-10-CM | POA: Diagnosis not present

## 2015-12-27 ENCOUNTER — Other Ambulatory Visit: Payer: Self-pay | Admitting: Family Medicine

## 2015-12-29 ENCOUNTER — Other Ambulatory Visit: Payer: Self-pay | Admitting: Family Medicine

## 2016-01-11 LAB — HM DIABETES EYE EXAM

## 2016-01-13 DIAGNOSIS — Z0100 Encounter for examination of eyes and vision without abnormal findings: Secondary | ICD-10-CM | POA: Diagnosis not present

## 2016-01-13 DIAGNOSIS — H25013 Cortical age-related cataract, bilateral: Secondary | ICD-10-CM | POA: Diagnosis not present

## 2016-01-13 DIAGNOSIS — E119 Type 2 diabetes mellitus without complications: Secondary | ICD-10-CM | POA: Diagnosis not present

## 2016-01-13 DIAGNOSIS — H04123 Dry eye syndrome of bilateral lacrimal glands: Secondary | ICD-10-CM | POA: Diagnosis not present

## 2016-01-18 ENCOUNTER — Other Ambulatory Visit: Payer: Self-pay | Admitting: Family Medicine

## 2016-01-18 DIAGNOSIS — Z85828 Personal history of other malignant neoplasm of skin: Secondary | ICD-10-CM | POA: Diagnosis not present

## 2016-01-18 DIAGNOSIS — L219 Seborrheic dermatitis, unspecified: Secondary | ICD-10-CM | POA: Diagnosis not present

## 2016-01-18 DIAGNOSIS — L570 Actinic keratosis: Secondary | ICD-10-CM | POA: Diagnosis not present

## 2016-01-18 DIAGNOSIS — X32XXXD Exposure to sunlight, subsequent encounter: Secondary | ICD-10-CM | POA: Diagnosis not present

## 2016-01-18 DIAGNOSIS — Z08 Encounter for follow-up examination after completed treatment for malignant neoplasm: Secondary | ICD-10-CM | POA: Diagnosis not present

## 2016-01-18 NOTE — Telephone Encounter (Signed)
Rx called in to requested pharmacy 

## 2016-01-18 NOTE — Telephone Encounter (Signed)
Last f/u 11/2015 

## 2016-01-19 ENCOUNTER — Encounter: Payer: Self-pay | Admitting: Family Medicine

## 2016-02-03 DIAGNOSIS — H906 Mixed conductive and sensorineural hearing loss, bilateral: Secondary | ICD-10-CM | POA: Diagnosis not present

## 2016-02-03 DIAGNOSIS — H903 Sensorineural hearing loss, bilateral: Secondary | ICD-10-CM | POA: Diagnosis not present

## 2016-02-03 DIAGNOSIS — R490 Dysphonia: Secondary | ICD-10-CM | POA: Diagnosis not present

## 2016-02-22 ENCOUNTER — Other Ambulatory Visit: Payer: Self-pay | Admitting: Family Medicine

## 2016-02-22 NOTE — Telephone Encounter (Signed)
Last f/u 11/2015 

## 2016-02-23 NOTE — Telephone Encounter (Signed)
Rx called in to requested pharmacy 

## 2016-02-28 ENCOUNTER — Ambulatory Visit (INDEPENDENT_AMBULATORY_CARE_PROVIDER_SITE_OTHER): Payer: PPO | Admitting: Neurology

## 2016-02-28 ENCOUNTER — Encounter: Payer: Self-pay | Admitting: Neurology

## 2016-02-28 DIAGNOSIS — R251 Tremor, unspecified: Secondary | ICD-10-CM | POA: Diagnosis not present

## 2016-02-28 DIAGNOSIS — E538 Deficiency of other specified B group vitamins: Secondary | ICD-10-CM | POA: Diagnosis not present

## 2016-02-28 DIAGNOSIS — G4719 Other hypersomnia: Secondary | ICD-10-CM | POA: Diagnosis not present

## 2016-02-28 DIAGNOSIS — G4733 Obstructive sleep apnea (adult) (pediatric): Secondary | ICD-10-CM | POA: Diagnosis not present

## 2016-02-28 NOTE — Patient Instructions (Addendum)
Remember to drink plenty of fluid, eat healthy meals and do not skip any meals. Try to eat protein with a every meal and eat a healthy snack such as fruit or nuts in between meals. Try to keep a regular sleep-wake schedule and try to exercise daily, particularly in the form of walking, 20-30 minutes a day, if you can.   As far as diagnostic testing: Lab today  I would like to see you back as needed, sooner if we need to. Please call us with any interim questions, concerns, problems, updates or refill requests.   Our phone number is [phone]. We also have an after hours call service for urgent matters and there is a physician on-call for urgent questions. For any emergencies you know to call 911 or go to the nearest emergency room
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
## 2016-02-29 ENCOUNTER — Telehealth: Payer: Self-pay | Admitting: *Deleted

## 2016-02-29 NOTE — Telephone Encounter (Signed)
I didn't, I forgot to add Vit D. If he wants to come back and have it done  Please place the order. Otherwise he can call his pcp if that is closer for him. Alternatively he can just take 2000IU Vitamin D3 daily. thanks

## 2016-02-29 NOTE — Telephone Encounter (Signed)
Dr Jaynee Eagles- please advise. Did you speak to pt about checking Vit D level?  Called and spoke to pt about lab results per Dr Jaynee Eagles note. He verbalized understanding. He questioned if she checked Vit D. Advised I did not see this lab, but will check with Dr Jaynee Eagles to make sure. He verbalized understanding.  Pt questioning if we got fax about recent sleep study results. He stated he spoke to office who had results yesterday and they stated they were going to fax it to [place]. I advised I will keep an eye out for it.

## 2016-02-29 NOTE — Telephone Encounter (Signed)
-----   Message from Melvenia Beam, MD sent at 02/29/2016  9:01 AM EDT ----- B12 looks great thanks

## 2016-03-01 LAB — B12 AND FOLATE PANEL
Folate: 17.5 ng/mL (ref >3.0)
Vitamin B-12: 1011 pg/mL — ABNORMAL HIGH (ref 211–946)

## 2016-03-01 LAB — METHYLMALONIC ACID, SERUM: METHYLMALONIC ACID: 178 nmol/L (ref 0–378)

## 2016-03-01 NOTE — Telephone Encounter (Signed)
I called pt and relayed the b12 level looked good.  Did not get Vit D.  I relayed that we can do this or his pcp when goes back to see them.  He stated will get pcp to do this. I stated he could take vit d 2000u daily alternatively per Dr. Jaynee Eagles.  He verbalized understanding.

## 2016-03-02 ENCOUNTER — Telehealth: Payer: Self-pay | Admitting: Neurology

## 2016-03-02 NOTE — Telephone Encounter (Signed)
Terrence Dupont, Let him know we reviewed his cpap report and it looks good thanks!

## 2016-03-03 NOTE — Telephone Encounter (Signed)
Called and spoke to wife, [name]. She states pt out mowing the lawn. I relayed Dr Cathren Laine message below. She stated she will let him know.

## 2016-03-14 ENCOUNTER — Other Ambulatory Visit: Payer: Self-pay | Admitting: Family Medicine

## 2016-03-30 ENCOUNTER — Encounter: Payer: Self-pay | Admitting: Family Medicine

## 2016-03-30 ENCOUNTER — Ambulatory Visit (INDEPENDENT_AMBULATORY_CARE_PROVIDER_SITE_OTHER): Payer: PPO | Admitting: Family Medicine

## 2016-03-30 VITALS — BP 116/70 | HR 58 | Temp 97.8°F | Wt 291.8 lb

## 2016-03-30 DIAGNOSIS — H9202 Otalgia, left ear: Secondary | ICD-10-CM

## 2016-03-30 MED ORDER — CYCLOBENZAPRINE HCL 5 MG PO TABS: 5.0000 mg | ORAL_TABLET | Freq: Three times a day (TID) | ORAL | Status: DC | PRN

## 2016-03-30 MED ORDER — AZITHROMYCIN 250 MG PO TABS: ORAL_TABLET | ORAL | Status: DC

## 2016-03-30 NOTE — Patient Instructions (Signed)
Great to see you.  Please take zpack as directed.  Keep me updated.

## 2016-03-30 NOTE — Progress Notes (Deleted)
Subjective:   Patient ID: Corey Bell, male    DOB: [birth year], 50 y.o.   MRN: [id number]  Corey Bell is a pleasant 50 y.o. year old male who presents to clinic today with Ear Fullness and Tinnitus  on 03/30/2016  HPI:      Current Outpatient Prescriptions on File Prior to Visit  Medication Sig Dispense Refill  . aspirin 325 MG tablet Take 325 mg by mouth daily.     Marland Kitchen buPROPion (WELLBUTRIN XL) 300 MG 24 hr tablet TAKE ONE (1) TABLET EACH DAY 30 tablet 5  . cholecalciferol (VITAMIN D) 1000 units tablet Take 1,000 Units by mouth daily.    . citalopram (CELEXA) 40 MG tablet TAKE ONE (1) TABLET EACH DAY 30 tablet 5  . erythromycin with ethanol (THERAMYCIN) 2 % external solution APPLY TWICE A DAY 60 mL 2  . ezetimibe (ZETIA) 10 MG tablet Take 10 mg by mouth daily.    . fexofenadine (ALLEGRA) 180 MG tablet Take 180 mg by mouth daily.    Marland Kitchen FREESTYLE LITE test strip USE AS DIRECTED 50 each 5  . gabapentin (NEURONTIN) 800 MG tablet Take 800 mg by mouth 4 (four) times daily.     Marland Kitchen glucose monitoring kit (FREESTYLE) monitoring kit 1 each by Does not apply route as needed for other. Use as directed 1 each 0  . ibuprofen (ADVIL,MOTRIN) 600 MG tablet Take 600 mg by mouth as needed.     Marland Kitchen levothyroxine (SYNTHROID, LEVOTHROID) 75 MCG tablet Take 75 mcg by mouth daily before breakfast.    . lisinopril (PRINIVIL,ZESTRIL) 10 MG tablet TAKE ONE (1) TABLET EACH DAY 30 tablet 5  . metFORMIN (GLUCOPHAGE) 500 MG tablet TAKE 1 TABLET BY MOUTH EVERY DAY WITH BREAKFAST 90 tablet 1  . methocarbamol (ROBAXIN) 500 MG tablet Take 1,000 mg by mouth every 6 (six) hours as needed for muscle spasms.     . metoprolol succinate (TOPROL-XL) 50 MG 24 hr tablet TAKE ONE (1) TABLET EACH DAY 30 tablet 11  . montelukast (SINGULAIR) 10 MG tablet TAKE 1 TABLET BY MOUTH EVERY NIGHT AT BEDTIME. 30 tablet 5  . ONE TOUCH LANCETS MISC Check blood sugar once daily and as directed. Dx. E11.9 200 each 1  . ranitidine (ZANTAC)  150 MG tablet Take 150 mg by mouth 2 (two) times daily.    . saw palmetto 80 MG capsule Take 80 mg by mouth daily.     . traMADol (ULTRAM) 50 MG tablet TAKE 1 TABLET BY MOUTH EVERY 8 HOURS AS NEEDED FOR MODERATE TO SEVEREPAIN 30 tablet 0  . vitamin B-12 (CYANOCOBALAMIN) 1000 MCG tablet Take 1,000 mcg by mouth daily.     No current facility-administered medications on file prior to visit.    Allergies  Allergen Reactions  . Augmentin [Amoxicillin-Pot Clavulanate] Diarrhea  . Demerol [Meperidine]     "goes crazy and has the strength of 10 men"  . Eggs Or Egg-Derived Products Diarrhea and Nausea And Vomiting  . Sulfamethoxazole Other (See Comments)    Blisters form in mouth  . Statins Rash  . Sulfa Antibiotics Rash  . Sulfacetamide Sodium Rash    Past Medical History  Diagnosis Date  . Carpal tunnel syndrome, bilateral   . H/O acute prostatitis   . Unspecified essential hypertension   . Diverticulitis of colon   . Esophageal reflux   . Mixed hyperlipidemia   . 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 Seasonal allergies   .  Aphthous ulcer 10-04-12    improving  . Chronic pain syndrome   . Insomnia, unspecified   . Tympanic membrane perforation     history of  . Anxiety   . Lumbar degenerative disc disease   . Hypothyroidism   . COPD (chronic obstructive pulmonary disease) ([place])   . Sleep apnea     uses cpap  . Headache(784.0)     hx of migraines  . Depression     Past Surgical History  Procedure Laterality Date  . Lumbarsacral disc surgery      L5-S1  . Tympanostomy tube placement  1975  . Meniscus repair Left     torn meniscus  . Nesbit procedure N/A 08/22/2013    Procedure: 16 DOT PLACTATION;  Surgeon: Claybon Jabs, MD;  Location: [facility];  Service: Urology;  Laterality: N/A;  . Vasectomy Bilateral 08/22/2013    Procedure: VASECTOMY;  Surgeon: Claybon Jabs, MD;  Location: [facility];  Service: Urology;  Laterality:  Bilateral;    Family History  Problem Relation Age of Onset  . Bladder Cancer Paternal Uncle   . Renal cancer Paternal Uncle     renal cell carcinoma  . Congestive Heart Failure Father   . Heart disease Father   . COPD Father   . Renal cancer Paternal Grandfather     renal call carcinoma  . Heart disease Brother     stents at 35 yo  . Parkinsonism Neg Hx     Social History   Social History  . Marital Status: Married    Spouse Name: Corey Bell  . Number of Children: 0  . Years of Education: college   Occupational History  . disabled   .  Unemployed   Social History Main Topics  . Smoking status: Former Smoker -- 1.50 packs/day for 30 years    Types: Cigarettes    Quit date: 10/10/2007  . Smokeless tobacco: Never Used  . Alcohol Use: 0.6 oz/week    1 Cans of beer per week     Comment: rarely  . Drug Use: No  . Sexual Activity: Not on file   Other Topics Concern  . Not on file   Social History Narrative   Married.  Corey Bell)  No children. Patient has Best boy.   In process of getting disability for low back pain.   Right handed.   Caffeine- Coffee a pot but not every day.   The PMH, PSH, Social History, Family History, Medications, and allergies have been reviewed in [facility], and have been updated if relevant.   Review of Systems     Objective:    There were no vitals taken for this visit.   Physical Exam        Assessment & Plan:   Left ear pain No Follow-up on file.

## 2016-03-30 NOTE — Progress Notes (Signed)
SUBJECTIVE: Corey Bell is a 50 y.o. male with 1 week(s) history of pain and pressure in left ear, and post nasal drip. Temperature not measured at home.   Current Outpatient Prescriptions on File Prior to Visit  Medication Sig Dispense Refill  . aspirin 325 MG tablet Take 325 mg by mouth daily.     Marland Kitchen buPROPion (WELLBUTRIN XL) 300 MG 24 hr tablet TAKE ONE (1) TABLET EACH DAY 30 tablet 5  . cholecalciferol (VITAMIN D) 1000 units tablet Take 1,000 Units by mouth daily.    . citalopram (CELEXA) 40 MG tablet TAKE ONE (1) TABLET EACH DAY 30 tablet 5  . erythromycin with ethanol (THERAMYCIN) 2 % external solution APPLY TWICE A DAY 60 mL 2  . ezetimibe (ZETIA) 10 MG tablet Take 10 mg by mouth daily.    . fexofenadine (ALLEGRA) 180 MG tablet Take 180 mg by mouth daily.    Marland Kitchen FREESTYLE LITE test strip USE AS DIRECTED 50 each 5  . gabapentin (NEURONTIN) 800 MG tablet Take 800 mg by mouth 4 (four) times daily.     Marland Kitchen glucose monitoring kit (FREESTYLE) monitoring kit 1 each by Does not apply route as needed for other. Use as directed 1 each 0  . ibuprofen (ADVIL,MOTRIN) 600 MG tablet Take 600 mg by mouth as needed.     Marland Kitchen levothyroxine (SYNTHROID, LEVOTHROID) 75 MCG tablet Take 75 mcg by mouth daily before breakfast.    . lisinopril (PRINIVIL,ZESTRIL) 10 MG tablet TAKE ONE (1) TABLET EACH DAY 30 tablet 5  . metFORMIN (GLUCOPHAGE) 500 MG tablet TAKE 1 TABLET BY MOUTH EVERY DAY WITH BREAKFAST 90 tablet 1  . methocarbamol (ROBAXIN) 500 MG tablet Take 1,000 mg by mouth every 6 (six) hours as needed for muscle spasms.     . metoprolol succinate (TOPROL-XL) 50 MG 24 hr tablet TAKE ONE (1) TABLET EACH DAY 30 tablet 11  . montelukast (SINGULAIR) 10 MG tablet TAKE 1 TABLET BY MOUTH EVERY NIGHT AT BEDTIME. 30 tablet 5  . ONE TOUCH LANCETS MISC Check blood sugar once daily and as directed. Dx. E11.9 200 each 1  . ranitidine (ZANTAC) 150 MG tablet Take 150 mg by mouth 2 (two) times daily.    . saw palmetto 80 MG  capsule Take 80 mg by mouth daily.     . traMADol (ULTRAM) 50 MG tablet TAKE 1 TABLET BY MOUTH EVERY 8 HOURS AS NEEDED FOR MODERATE TO SEVEREPAIN 30 tablet 0  . vitamin B-12 (CYANOCOBALAMIN) 1000 MCG tablet Take 1,000 mcg by mouth daily.     No current facility-administered medications on file prior to visit.    Allergies  Allergen Reactions  . Augmentin [Amoxicillin-Pot Clavulanate] Diarrhea  . Demerol [Meperidine]     "goes crazy and has the strength of 10 men"  . Eggs Or Egg-Derived Products Diarrhea and Nausea And Vomiting  . Sulfamethoxazole Other (See Comments)    Blisters form in mouth  . Statins Rash  . Sulfa Antibiotics Rash  . Sulfacetamide Sodium Rash    Past Medical History  Diagnosis Date  . Carpal tunnel syndrome, bilateral   . H/O acute prostatitis   . Unspecified essential hypertension   . Diverticulitis of colon   . Esophageal reflux   . Mixed hyperlipidemia   . 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 Seasonal allergies   . Aphthous ulcer 10-04-12    improving  . Chronic pain syndrome   . Insomnia, unspecified   . Tympanic  membrane perforation     history of  . Anxiety   . Lumbar degenerative disc disease   . Hypothyroidism   . COPD (chronic obstructive pulmonary disease) ([place])   . Sleep apnea     uses cpap  . Headache(784.0)     hx of migraines  . Depression     Past Surgical History  Procedure Laterality Date  . Lumbarsacral disc surgery      L5-S1  . Tympanostomy tube placement  1975  . Meniscus repair Left     torn meniscus  . Nesbit procedure N/A 08/22/2013    Procedure: 16 DOT PLACTATION;  Surgeon: Claybon Jabs, MD;  Location: [facility];  Service: Urology;  Laterality: N/A;  . Vasectomy Bilateral 08/22/2013    Procedure: VASECTOMY;  Surgeon: Claybon Jabs, MD;  Location: [facility];  Service: Urology;  Laterality: Bilateral;    Family History  Problem Relation Age of Onset  . Bladder Cancer  Paternal Uncle   . Renal cancer Paternal Uncle     renal cell carcinoma  . Congestive Heart Failure Father   . Heart disease Father   . COPD Father   . Renal cancer Paternal Grandfather     renal call carcinoma  . Heart disease Brother     stents at 37 yo  . Parkinsonism Neg Hx     Social History   Social History  . Marital Status: Married    Spouse Name: [name]  . Number of Children: 0  . Years of Education: college   Occupational History  . disabled   .  Unemployed   Social History Main Topics  . Smoking status: Former Smoker -- 1.50 packs/day for 30 years    Types: Cigarettes    Quit date: 10/10/2007  . Smokeless tobacco: Never Used  . Alcohol Use: 0.6 oz/week    1 Cans of beer per week     Comment: rarely  . Drug Use: No  . Sexual Activity: Not on file   Other Topics Concern  . Not on file   Social History Narrative   Married.  [name])  No children. Patient has Best boy.   In process of getting disability for low back pain.   Right handed.   Caffeine- Coffee a pot but not every day.   The PMH, PSH, Social History, Family History, Medications, and allergies have been reviewed in [facility], and have been updated if relevant.  OBJECTIVE: BP 116/70 mmHg  Pulse 58  Temp(Src) 97.8 F (36.6 C) (Oral)  Wt 291 lb 12 oz (132.337 kg)  SpO2 96% General appearance: alert, well appearing, and in no distress and oriented to person, place, and time.   Ears: right ear normal, left TM red, dull, bulging Nose: normal and patent, no erythema, discharge or polyps Oropharynx: mucous membranes moist, pharynx normal without lesions Neck: supple, no significant adenopathy Lungs: clear to auscultation, no wheezes, rales or rhonchi, symmetric air entry  ASSESSMENT: Otitis Media  PLAN: 1) See orders for this visit as documented in the electronic medical record. zpack as directed Allergic to augmentin 2) Symptomatic therapy suggested: use acetaminophen, ibuprofen prn.  3)  Call or return to clinic prn if these symptoms worsen or fail to improve as anticipated.

## 2016-03-30 NOTE — Progress Notes (Signed)
Pre visit review using our clinic review tool, if applicable. No additional management support is needed unless otherwise documented below in the visit note. 

## 2016-04-08 ENCOUNTER — Other Ambulatory Visit: Payer: Self-pay | Admitting: Family Medicine

## 2016-04-10 ENCOUNTER — Encounter: Payer: Self-pay | Admitting: Family Medicine

## 2016-04-10 ENCOUNTER — Ambulatory Visit (INDEPENDENT_AMBULATORY_CARE_PROVIDER_SITE_OTHER): Payer: PPO | Admitting: Family Medicine

## 2016-04-10 VITALS — BP 126/70 | HR 73 | Temp 98.2°F | Wt 292.5 lb

## 2016-04-10 DIAGNOSIS — H9202 Otalgia, left ear: Secondary | ICD-10-CM | POA: Diagnosis not present

## 2016-04-10 MED ORDER — AZITHROMYCIN 250 MG PO TABS: ORAL_TABLET | ORAL | Status: DC

## 2016-04-10 NOTE — Progress Notes (Signed)
Pre visit review using our clinic review tool, if applicable. No additional management support is needed unless otherwise documented below in the visit note. 

## 2016-04-10 NOTE — Progress Notes (Signed)
Subjective:   Patient ID: Corey Bell, male    DOB: 1965/10/31, 50 y.o.   MRN: [id number]  Corey Bell is a pleasant 50 y.o. year old male who presents to clinic today with Ear Pain  on 04/10/2016  HPI:  Saw him on 03/30/16 for left ear pain.  Note reviewed.  TM red, buldging.  Treated with Zpack, allergic to Augmentin.  Here today because symptoms improved dramatically and then over night, pain and pressure returned.  Current Outpatient Prescriptions on File Prior to Visit  Medication Sig Dispense Refill  . aspirin 325 MG tablet Take 325 mg by mouth daily.     Marland Kitchen buPROPion (WELLBUTRIN XL) 300 MG 24 hr tablet TAKE ONE (1) TABLET EACH DAY 30 tablet 5  . cholecalciferol (VITAMIN D) 1000 units tablet Take 1,000 Units by mouth daily.    . citalopram (CELEXA) 40 MG tablet TAKE ONE (1) TABLET EACH DAY 30 tablet 5  . cyclobenzaprine (FLEXERIL) 5 MG tablet Take 1 tablet (5 mg total) by mouth 3 (three) times daily as needed for muscle spasms. 30 tablet 1  . erythromycin with ethanol (THERAMYCIN) 2 % external solution APPLY TWICE A DAY 60 mL 2  . ezetimibe (ZETIA) 10 MG tablet Take 10 mg by mouth daily.    . fexofenadine (ALLEGRA) 180 MG tablet Take 180 mg by mouth daily.    Marland Kitchen FREESTYLE LITE test strip USE AS DIRECTED 50 each 5  . gabapentin (NEURONTIN) 800 MG tablet Take 800 mg by mouth 4 (four) times daily.     Marland Kitchen glucose monitoring kit (FREESTYLE) monitoring kit 1 each by Does not apply route as needed for other. Use as directed 1 each 0  . ibuprofen (ADVIL,MOTRIN) 600 MG tablet Take 600 mg by mouth as needed.     Marland Kitchen levothyroxine (SYNTHROID, LEVOTHROID) 75 MCG tablet Take 75 mcg by mouth daily before breakfast.    . lisinopril (PRINIVIL,ZESTRIL) 10 MG tablet TAKE ONE (1) TABLET EACH DAY 30 tablet 5  . metFORMIN (GLUCOPHAGE) 500 MG tablet TAKE 1 TABLET BY MOUTH EVERY DAY WITH BREAKFAST 90 tablet 1  . methocarbamol (ROBAXIN) 500 MG tablet Take 1,000 mg by mouth every 6 (six) hours as  needed for muscle spasms.     . metoprolol succinate (TOPROL-XL) 50 MG 24 hr tablet TAKE ONE (1) TABLET EACH DAY 30 tablet 11  . montelukast (SINGULAIR) 10 MG tablet TAKE 1 TABLET BY MOUTH EVERY NIGHT AT BEDTIME. 30 tablet 5  . ONE TOUCH LANCETS MISC Check blood sugar once daily and as directed. Dx. E11.9 200 each 1  . ranitidine (ZANTAC) 150 MG tablet Take 150 mg by mouth 2 (two) times daily.    . saw palmetto 80 MG capsule Take 80 mg by mouth daily.     . traMADol (ULTRAM) 50 MG tablet TAKE 1 TABLET BY MOUTH EVERY 8 HOURS AS NEEDED FOR MODERATE TO SEVEREPAIN 30 tablet 0  . vitamin B-12 (CYANOCOBALAMIN) 1000 MCG tablet Take 1,000 mcg by mouth daily.     No current facility-administered medications on file prior to visit.    Allergies  Allergen Reactions  . Augmentin [Amoxicillin-Pot Clavulanate] Diarrhea  . Demerol [Meperidine]     "goes crazy and has the strength of 10 men"  . Eggs Or Egg-Derived Products Diarrhea and Nausea And Vomiting  . Sulfamethoxazole Other (See Comments)    Blisters form in mouth  . Statins Rash  . Sulfa Antibiotics Rash  . Sulfacetamide Sodium Rash  Past Medical History  Diagnosis Date  . Carpal tunnel syndrome, bilateral   . H/O acute prostatitis   . Unspecified essential hypertension   . Diverticulitis of colon   . Esophageal reflux   . Mixed hyperlipidemia   . 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 Seasonal allergies   . Aphthous ulcer 10-04-12    improving  . Chronic pain syndrome   . Insomnia, unspecified   . Tympanic membrane perforation     history of  . Anxiety   . Lumbar degenerative disc disease   . Hypothyroidism   . COPD (chronic obstructive pulmonary disease) ([place])   . Sleep apnea     uses cpap  . Headache(784.0)     hx of migraines  . Depression     Past Surgical History  Procedure Laterality Date  . Lumbarsacral disc surgery      L5-S1  . Tympanostomy tube placement  1975  . Meniscus repair Left     torn  meniscus  . Nesbit procedure N/A 08/22/2013    Procedure: 16 DOT PLACTATION;  Surgeon: Claybon Jabs, MD;  Location: [facility];  Service: Urology;  Laterality: N/A;  . Vasectomy Bilateral 08/22/2013    Procedure: VASECTOMY;  Surgeon: Claybon Jabs, MD;  Location: [facility];  Service: Urology;  Laterality: Bilateral;    Family History  Problem Relation Age of Onset  . Bladder Cancer Paternal Uncle   . Renal cancer Paternal Uncle     renal cell carcinoma  . Congestive Heart Failure Father   . Heart disease Father   . COPD Father   . Renal cancer Paternal Grandfather     renal call carcinoma  . Heart disease Brother     stents at 64 yo  . Parkinsonism Neg Hx     Social History   Social History  . Marital Status: Married    Spouse Name: [name]  . Number of Children: 0  . Years of Education: college   Occupational History  . disabled   .  Unemployed   Social History Main Topics  . Smoking status: Former Smoker -- 1.50 packs/day for 30 years    Types: Cigarettes    Quit date: 10/10/2007  . Smokeless tobacco: Never Used  . Alcohol Use: 0.6 oz/week    1 Cans of beer per week     Comment: rarely  . Drug Use: No  . Sexual Activity: Not on file   Other Topics Concern  . Not on file   Social History Narrative   Married.  [name])  No children. Patient has Best boy.   In process of getting disability for low back pain.   Right handed.   Caffeine- Coffee a pot but not every day.   The PMH, PSH, Social History, Family History, Medications, and allergies have been reviewed in [facility], and have been updated if relevant.  Review of Systems  Constitutional: Negative.   HENT: Positive for ear pain. Negative for ear discharge and tinnitus.   Respiratory: Negative.   Cardiovascular: Negative.   Skin: Negative.   Allergic/Immunologic: Negative.   All other systems reviewed and are negative.      Objective:    BP 126/70 mmHg  Pulse  73  Temp(Src) 98.2 F (36.8 C) (Oral)  Wt 292 lb 8 oz (132.677 kg)  SpO2 96%   Physical Exam  Constitutional: He is oriented to person, place, and time. He appears well-developed and well-nourished.  No distress.  HENT:  Left Ear: No lacerations. No drainage, swelling or tenderness. No mastoid tenderness. Tympanic membrane is injected. Tympanic membrane is not retracted. A middle ear effusion is present. No decreased hearing is noted.  Eyes: Conjunctivae are normal.  Cardiovascular: Normal rate.   Pulmonary/Chest: Effort normal.  Musculoskeletal: Normal range of motion.  Neurological: He is alert and oriented to person, place, and time. No cranial nerve deficit.  Skin: Skin is warm and dry. He is not diaphoretic.  Psychiatric: He has a normal mood and affect. His behavior is normal. Judgment and thought content normal.  Nursing note and vitals reviewed.         Assessment & Plan:   Left ear pain No Follow-up on file.

## 2016-04-10 NOTE — Assessment & Plan Note (Signed)
Otitis media improving on exam but still present. Repeat course of zpack. Call or return to clinic prn if these symptoms worsen or fail to improve as anticipated. The patient indicates understanding of these issues and agrees with the plan.

## 2016-04-21 ENCOUNTER — Other Ambulatory Visit: Payer: Self-pay | Admitting: *Deleted

## 2016-04-21 DIAGNOSIS — M791 Myalgia: Secondary | ICD-10-CM | POA: Diagnosis not present

## 2016-04-21 DIAGNOSIS — M5412 Radiculopathy, cervical region: Secondary | ICD-10-CM | POA: Diagnosis not present

## 2016-04-21 DIAGNOSIS — M5416 Radiculopathy, lumbar region: Secondary | ICD-10-CM | POA: Diagnosis not present

## 2016-04-21 MED ORDER — TRAMADOL HCL 50 MG PO TABS: ORAL_TABLET | ORAL | Status: DC

## 2016-04-21 NOTE — Telephone Encounter (Signed)
Rx called in to requested pharmacy 

## 2016-04-21 NOTE — Telephone Encounter (Signed)
Last f/u 11/2015 

## 2016-05-04 ENCOUNTER — Ambulatory Visit (INDEPENDENT_AMBULATORY_CARE_PROVIDER_SITE_OTHER): Payer: PPO | Admitting: Family Medicine

## 2016-05-04 ENCOUNTER — Encounter: Payer: Self-pay | Admitting: Family Medicine

## 2016-05-04 VITALS — BP 120/82 | HR 52 | Temp 97.7°F | Wt 291.5 lb

## 2016-05-04 DIAGNOSIS — H9202 Otalgia, left ear: Secondary | ICD-10-CM

## 2016-05-04 MED ORDER — PREDNISONE 20 MG PO TABS: ORAL_TABLET | ORAL | 0 refills | Status: DC

## 2016-05-04 NOTE — Assessment & Plan Note (Signed)
Effusion persists, unlikely infectious at this point. Trial of prednisone taper. If symptoms persist, refer to ENT for possible drainage. The patient indicates understanding of these issues and agrees with the plan.

## 2016-05-04 NOTE — Progress Notes (Signed)
Pre visit review using our clinic review tool, if applicable. No additional management support is needed unless otherwise documented below in the visit note. 

## 2016-05-04 NOTE — Patient Instructions (Signed)
Great to see you.  Please take prednisone as directed and keep me updated.

## 2016-05-04 NOTE — Progress Notes (Signed)
Subjective:   Patient ID: Corey Bell, male    DOB: 15-Nov-1965, 50 y.o.   MRN: [id number]  Corey Bell is a pleasant 50 y.o. year old male who presents to clinic today with Ear Pain (L)  on 05/04/2016  HPI:  Left ear pain- recurrent/persistent issue.  I have already seen him twice for this complaint- chart reviewed.  Initially seen on 03/30/16- note reviewed. At that time, complained of left ear pain/pressure x 1 week. Exam consistent with left otitis media- treated with zpack (allergic to augmentin/amox).  Returned to see me on 04/10/16- note reviewed. Symptoms had improved with initial course of zpack but symptoms reoccurred once he finished rx. TM was still a little injected and there was a small effusion behind TM so I treated him with another round of zpack.  Here today because pain has improved but left ear still feels very full and is sensitive to sound.  Current Outpatient Prescriptions on File Prior to Visit  Medication Sig Dispense Refill  . aspirin 325 MG tablet Take 325 mg by mouth daily.     Marland Kitchen buPROPion (WELLBUTRIN XL) 300 MG 24 hr tablet TAKE ONE (1) TABLET EACH DAY 30 tablet 5  . cholecalciferol (VITAMIN D) 1000 units tablet Take 1,000 Units by mouth daily.    . citalopram (CELEXA) 40 MG tablet TAKE ONE (1) TABLET EACH DAY 30 tablet 5  . cyclobenzaprine (FLEXERIL) 5 MG tablet Take 1 tablet (5 mg total) by mouth 3 (three) times daily as needed for muscle spasms. 30 tablet 1  . erythromycin with ethanol (THERAMYCIN) 2 % external solution APPLY TWICE A DAY 60 mL 2  . ezetimibe (ZETIA) 10 MG tablet Take 10 mg by mouth daily.    . fexofenadine (ALLEGRA) 180 MG tablet Take 180 mg by mouth daily.    Marland Kitchen FREESTYLE LITE test strip USE AS DIRECTED 50 each 5  . gabapentin (NEURONTIN) 800 MG tablet Take 800 mg by mouth 4 (four) times daily.     Marland Kitchen glucose monitoring kit (FREESTYLE) monitoring kit 1 each by Does not apply route as needed for other. Use as directed 1 each 0  .  ibuprofen (ADVIL,MOTRIN) 600 MG tablet Take 600 mg by mouth as needed.     Marland Kitchen levothyroxine (SYNTHROID, LEVOTHROID) 75 MCG tablet Take 75 mcg by mouth daily before breakfast.    . lisinopril (PRINIVIL,ZESTRIL) 10 MG tablet TAKE ONE (1) TABLET EACH DAY 30 tablet 5  . metFORMIN (GLUCOPHAGE) 500 MG tablet TAKE 1 TABLET BY MOUTH EVERY DAY WITH BREAKFAST 90 tablet 1  . methocarbamol (ROBAXIN) 500 MG tablet Take 1,000 mg by mouth every 6 (six) hours as needed for muscle spasms.     . metoprolol succinate (TOPROL-XL) 50 MG 24 hr tablet TAKE ONE (1) TABLET EACH DAY 30 tablet 11  . montelukast (SINGULAIR) 10 MG tablet TAKE 1 TABLET BY MOUTH EVERY NIGHT AT BEDTIME. 30 tablet 5  . ONE TOUCH LANCETS MISC Check blood sugar once daily and as directed. Dx. E11.9 200 each 1  . ranitidine (ZANTAC) 150 MG tablet Take 150 mg by mouth 2 (two) times daily.    . saw palmetto 80 MG capsule Take 80 mg by mouth daily.     . traMADol (ULTRAM) 50 MG tablet TAKE 1 TABLET BY MOUTH EVERY 8 HOURS AS NEEDED FOR MODERATE TO SEVEREPAIN 30 tablet 0  . vitamin B-12 (CYANOCOBALAMIN) 1000 MCG tablet Take 1,000 mcg by mouth daily.     No  current facility-administered medications on file prior to visit.     Allergies  Allergen Reactions  . Augmentin [Amoxicillin-Pot Clavulanate] Diarrhea  . Demerol [Meperidine]     "goes crazy and has the strength of 10 men"  . Eggs Or Egg-Derived Products Diarrhea and Nausea And Vomiting  . Sulfamethoxazole Other (See Comments)    Blisters form in mouth  . Statins Rash  . Sulfa Antibiotics Rash  . Sulfacetamide Sodium Rash    Past Medical History:  Diagnosis Date  . Anxiety   . Aphthous ulcer 10-04-12   improving  . Carpal tunnel syndrome, bilateral   . Chronic pain syndrome   . COPD (chronic obstructive pulmonary disease) ([place])   . Depression   . Diverticulitis of colon   . Esophageal reflux   . H/O acute prostatitis   . Headache(784.0)    hx of migraines  . Hypothyroidism   .  Insomnia, unspecified   . Lumbar degenerative disc disease   . Mixed hyperlipidemia   . Peyronie disease 12-20-12   PER uROLOGY  . Seasonal allergies   . Sleep apnea    uses cpap  . Tympanic membrane perforation    history of  . Unspecified essential hypertension     Past Surgical History:  Procedure Laterality Date  . LumbarSacral Disc Surgery     L5-S1  . MENISCUS REPAIR Left    torn meniscus  . NESBIT PROCEDURE N/A 08/22/2013   Procedure: 16 DOT PLACTATION;  Surgeon: Claybon Jabs, MD;  Location: [facility];  Service: Urology;  Laterality: N/A;  . TYMPANOSTOMY TUBE PLACEMENT  1975  . VASECTOMY Bilateral 08/22/2013   Procedure: VASECTOMY;  Surgeon: Claybon Jabs, MD;  Location: [facility];  Service: Urology;  Laterality: Bilateral;    Family History  Problem Relation Age of Onset  . Bladder Cancer Paternal Uncle   . Renal cancer Paternal Uncle     renal cell carcinoma  . Congestive Heart Failure Father   . Heart disease Father   . COPD Father   . Renal cancer Paternal Grandfather     renal call carcinoma  . Heart disease Brother     stents at 35 yo  . Parkinsonism Neg Hx     Social History   Social History  . Marital status: Married    Spouse name: [name]  . Number of children: 0  . Years of education: college   Occupational History  . disabled Nurse, mental health  .  Unemployed   Social History Main Topics  . Smoking status: Former Smoker    Packs/day: 1.50    Years: 30.00    Types: Cigarettes    Quit date: 10/10/2007  . Smokeless tobacco: Never Used  . Alcohol use 0.6 oz/week    1 Cans of beer per week     Comment: rarely  . Drug use: No  . Sexual activity: Not on file   Other Topics Concern  . Not on file   Social History Narrative   Married.  [name])  No children. Patient has Best boy.   In process of getting disability for low back pain.   Right handed.   Caffeine- Coffee a pot but not every day.   The  PMH, PSH, Social History, Family History, Medications, and allergies have been reviewed in [facility], and have been updated if relevant.    Review of Systems  Constitutional: Negative.   HENT: Positive for ear pain. Negative for congestion, rhinorrhea and  sinus pressure.   Respiratory: Negative.   Cardiovascular: Negative.   Neurological: Negative for dizziness.  All other systems reviewed and are negative.      Objective:    BP 120/82   Pulse (!) 52   Temp 97.7 F (36.5 C) (Oral)   Wt 291 lb 8 oz (132.2 kg)   SpO2 97%   BMI 37.43 kg/m    Physical Exam  Constitutional: He is oriented to person, place, and time. He appears well-developed and well-nourished. No distress.  HENT:  Head: Normocephalic.  Left Ear: External ear normal. A middle ear effusion is present.  Eyes: Conjunctivae are normal.  Pulmonary/Chest: Effort normal.  Musculoskeletal: Normal range of motion.  Neurological: He is alert and oriented to person, place, and time. No cranial nerve deficit.  Skin: Skin is warm and dry. He is not diaphoretic.  Psychiatric: He has a normal mood and affect. His behavior is normal. Judgment and thought content normal.  Nursing note and vitals reviewed.         Assessment & Plan:   Left ear pain No Follow-up on file.

## 2016-05-16 DIAGNOSIS — H6982 Other specified disorders of Eustachian tube, left ear: Secondary | ICD-10-CM | POA: Diagnosis not present

## 2016-05-16 DIAGNOSIS — H9202 Otalgia, left ear: Secondary | ICD-10-CM | POA: Diagnosis not present

## 2016-05-29 ENCOUNTER — Other Ambulatory Visit: Payer: Self-pay | Admitting: Family Medicine

## 2016-05-30 DIAGNOSIS — M5416 Radiculopathy, lumbar region: Secondary | ICD-10-CM | POA: Diagnosis not present

## 2016-05-30 DIAGNOSIS — M791 Myalgia: Secondary | ICD-10-CM | POA: Diagnosis not present

## 2016-06-13 ENCOUNTER — Other Ambulatory Visit: Payer: Self-pay | Admitting: Family Medicine

## 2016-06-22 ENCOUNTER — Other Ambulatory Visit: Payer: Self-pay

## 2016-06-22 MED ORDER — OXYCODONE-ACETAMINOPHEN 5-325 MG PO TABS: 1.0000 | ORAL_TABLET | Freq: Four times a day (QID) | ORAL | 0 refills | Status: DC | PRN

## 2016-06-22 NOTE — Telephone Encounter (Signed)
Pt having back pain and request rx for oxycodone apap. Pt said does not have to take often. Call when ready for pick up. Printed last # 30 on 06/26/2014; last f/u 12/06/15.

## 2016-06-23 ENCOUNTER — Telehealth: Payer: Self-pay | Admitting: *Deleted

## 2016-06-23 NOTE — Telephone Encounter (Signed)
Spoke to pt and informed him Rx is available for pickup from the front desk 

## 2016-06-23 NOTE — Telephone Encounter (Signed)
Last f/u 11/2015 

## 2016-06-26 ENCOUNTER — Other Ambulatory Visit: Payer: Self-pay | Admitting: Family Medicine

## 2016-06-29 ENCOUNTER — Other Ambulatory Visit: Payer: Self-pay

## 2016-06-29 MED ORDER — TRAMADOL HCL 50 MG PO TABS: ORAL_TABLET | ORAL | 0 refills | Status: DC

## 2016-06-29 NOTE — Telephone Encounter (Signed)
Rx called in to requested pharmacy 

## 2016-06-29 NOTE — Telephone Encounter (Signed)
Pt said [facility] requested refill last week for tramadol; do not see in pts chart. Pt request refill tramadol to [facility]. Last refilled # 30 on 04/21/16. Pt last f/u appt on 12/06/15 and last acute visit on 05/04/16. Pt request cb when refill done.

## 2016-07-13 DIAGNOSIS — M791 Myalgia: Secondary | ICD-10-CM | POA: Diagnosis not present

## 2016-07-24 ENCOUNTER — Encounter: Payer: Self-pay | Admitting: Family Medicine

## 2016-07-24 ENCOUNTER — Encounter: Payer: Self-pay | Admitting: *Deleted

## 2016-07-24 ENCOUNTER — Ambulatory Visit (INDEPENDENT_AMBULATORY_CARE_PROVIDER_SITE_OTHER): Payer: PPO | Admitting: Family Medicine

## 2016-07-24 ENCOUNTER — Ambulatory Visit (INDEPENDENT_AMBULATORY_CARE_PROVIDER_SITE_OTHER)
Admission: RE | Admit: 2016-07-24 | Discharge: 2016-07-24 | Disposition: A | Discharge status: Home / Self Care | Payer: PPO | Source: Ambulatory Visit | Attending: Family Medicine | Admitting: Family Medicine

## 2016-07-24 VITALS — BP 132/78 | HR 64 | Temp 97.9°F | Wt 288.0 lb

## 2016-07-24 DIAGNOSIS — G4700 Insomnia, unspecified: Secondary | ICD-10-CM | POA: Diagnosis not present

## 2016-07-24 DIAGNOSIS — M79672 Pain in left foot: Secondary | ICD-10-CM

## 2016-07-24 DIAGNOSIS — E1122 Type 2 diabetes mellitus with diabetic chronic kidney disease: Secondary | ICD-10-CM | POA: Diagnosis not present

## 2016-07-24 DIAGNOSIS — M25572 Pain in left ankle and joints of left foot: Secondary | ICD-10-CM

## 2016-07-24 DIAGNOSIS — M79671 Pain in right foot: Secondary | ICD-10-CM | POA: Insufficient documentation

## 2016-07-24 DIAGNOSIS — M25579 Pain in unspecified ankle and joints of unspecified foot: Secondary | ICD-10-CM | POA: Insufficient documentation

## 2016-07-24 DIAGNOSIS — E119 Type 2 diabetes mellitus without complications: Secondary | ICD-10-CM

## 2016-07-24 LAB — HEMOGLOBIN A1C: HEMOGLOBIN A1C: 6.1 % (ref 4.6–6.5)

## 2016-07-24 MED ORDER — EZETIMIBE 10 MG PO TABS: 10.0000 mg | ORAL_TABLET | Freq: Every day | ORAL | 6 refills | Status: DC

## 2016-07-24 NOTE — Patient Instructions (Signed)
Great to see you.  We will call you with your xray results.   

## 2016-07-24 NOTE — Progress Notes (Signed)
Subjective:   Patient ID: Corey Bell, male    DOB: 10/07/1966, 50 y.o.   MRN: [id number]  Corey Bell is a pleasant 50 y.o. year old male who presents to clinic today with Foot Pain (left)  on 07/24/2016  HPI:  Left foot pain-  Intermittent for months.  Lateral side of left foot.  No known trauma.  Certain shoes make it worse. No redness or warmth.  Has not taken anything for it.  He does feel it is getting progressively worse.  Diabetes has been well controlled.  Lab Results  Component Value Date   HGBA1C 6.2 12/06/2015    Current Outpatient Prescriptions on File Prior to Visit  Medication Sig Dispense Refill  . aspirin 325 MG tablet Take 325 mg by mouth daily.     Marland Kitchen buPROPion (WELLBUTRIN XL) 300 MG 24 hr tablet TAKE 1 TABLET BY MOUTH EVERY DAY. 90 tablet 1  . cholecalciferol (VITAMIN D) 1000 units tablet Take 1,000 Units by mouth daily.    . citalopram (CELEXA) 40 MG tablet TAKE 1 TABLET BY MOUTH EVERY DAY. 90 tablet 1  . cyclobenzaprine (FLEXERIL) 5 MG tablet Take 1 tablet (5 mg total) by mouth 3 (three) times daily as needed for muscle spasms. 30 tablet 1  . erythromycin with ethanol (THERAMYCIN) 2 % external solution APPLY TWICE A DAY 60 mL 2  . fexofenadine (ALLEGRA) 180 MG tablet Take 180 mg by mouth daily.    Marland Kitchen FREESTYLE LITE test strip USE AS DIRECTED 50 each 5  . gabapentin (NEURONTIN) 800 MG tablet Take 800 mg by mouth 4 (four) times daily.     Marland Kitchen glucose monitoring kit (FREESTYLE) monitoring kit 1 each by Does not apply route as needed for other. Use as directed 1 each 0  . ibuprofen (ADVIL,MOTRIN) 600 MG tablet Take 600 mg by mouth as needed.     Marland Kitchen levothyroxine (SYNTHROID, LEVOTHROID) 75 MCG tablet TAKE ONE (1) TABLET EACH DAY 30 tablet 5  . lisinopril (PRINIVIL,ZESTRIL) 10 MG tablet TAKE 1 TABLET BY MOUTH EVERY DAY. 90 tablet 1  . metFORMIN (GLUCOPHAGE) 500 MG tablet TAKE ONE TABLET BY MOUTH EVERY DAY WITH BREAKFAST 90 tablet 1  . methocarbamol  (ROBAXIN) 500 MG tablet Take 1,000 mg by mouth every 6 (six) hours as needed for muscle spasms.     . metoprolol succinate (TOPROL-XL) 50 MG 24 hr tablet TAKE ONE (1) TABLET EACH DAY 30 tablet 11  . montelukast (SINGULAIR) 10 MG tablet TAKE 1 TABLET BY MOUTH EVERY NIGHT AT BEDTIME. 30 tablet 5  . ONE TOUCH LANCETS MISC Check blood sugar once daily and as directed. Dx. E11.9 200 each 1  . oxyCODONE-acetaminophen (PERCOCET/ROXICET) 5-325 MG tablet Take 1 tablet by mouth every 6 (six) hours as needed for severe pain. 30 tablet 0  . ranitidine (ZANTAC) 150 MG tablet Take 150 mg by mouth 2 (two) times daily.    . traMADol (ULTRAM) 50 MG tablet TAKE 1 TABLET BY MOUTH EVERY 8 HOURS AS NEEDED FOR MODERATE TO SEVEREPAIN 30 tablet 0  . vitamin B-12 (CYANOCOBALAMIN) 1000 MCG tablet Take 1,000 mcg by mouth daily.     No current facility-administered medications on file prior to visit.     Allergies  Allergen Reactions  . Augmentin [Amoxicillin-Pot Clavulanate] Diarrhea  . Demerol [Meperidine]     "goes crazy and has the strength of 10 men"  . Eggs Or Egg-Derived Products Diarrhea and Nausea And Vomiting  . Sulfamethoxazole Other (See  Comments)    Blisters form in mouth  . Statins Rash  . Sulfa Antibiotics Rash  . Sulfacetamide Sodium Rash    Past Medical History:  Diagnosis Date  . Anxiety   . Aphthous ulcer 10-04-12   improving  . Carpal tunnel syndrome, bilateral   . Chronic pain syndrome   . COPD (chronic obstructive pulmonary disease) ([place])   . Depression   . Diverticulitis of colon   . Esophageal reflux   . H/O acute prostatitis   . Headache(784.0)    hx of migraines  . Hypothyroidism   . Insomnia, unspecified   . Lumbar degenerative disc disease   . Mixed hyperlipidemia   . Peyronie disease 12-20-12   PER uROLOGY  . Seasonal allergies   . Sleep apnea    uses cpap  . Tympanic membrane perforation    history of  . Unspecified essential hypertension     Past Surgical  History:  Procedure Laterality Date  . LumbarSacral Disc Surgery     L5-S1  . MENISCUS REPAIR Left    torn meniscus  . NESBIT PROCEDURE N/A 08/22/2013   Procedure: 16 DOT PLACTATION;  Surgeon: Claybon Jabs, MD;  Location: [facility];  Service: Urology;  Laterality: N/A;  . TYMPANOSTOMY TUBE PLACEMENT  1975  . VASECTOMY Bilateral 08/22/2013   Procedure: VASECTOMY;  Surgeon: Claybon Jabs, MD;  Location: [facility];  Service: Urology;  Laterality: Bilateral;    Family History  Problem Relation Age of Onset  . Bladder Cancer Paternal Uncle   . Renal cancer Paternal Uncle     renal cell carcinoma  . Congestive Heart Failure Father   . Heart disease Father   . COPD Father   . Renal cancer Paternal Grandfather     renal call carcinoma  . Heart disease Brother     stents at 64 yo  . Parkinsonism Neg Hx     Social History   Social History  . Marital status: Married    Spouse name: [name]  . Number of children: 0  . Years of education: college   Occupational History  . disabled Nurse, mental health  .  Unemployed   Social History Main Topics  . Smoking status: Former Smoker    Packs/day: 1.50    Years: 30.00    Types: Cigarettes    Quit date: 10/10/2007  . Smokeless tobacco: Never Used  . Alcohol use 0.6 oz/week    1 Cans of beer per week     Comment: rarely  . Drug use: No  . Sexual activity: Not on file   Other Topics Concern  . Not on file   Social History Narrative   Married.  [name])  No children. Patient has Best boy.   In process of getting disability for low back pain.   Right handed.   Caffeine- Coffee a pot but not every day.   The PMH, PSH, Social History, Family History, Medications, and allergies have been reviewed in [facility], and have been updated if relevant.   Review of Systems  Musculoskeletal: Positive for arthralgias. Negative for back pain, gait problem, joint swelling, myalgias, neck pain and neck stiffness.    Skin: Negative.   All other systems reviewed and are negative.      Objective:    BP 132/78   Pulse 64   Temp 97.9 F (36.6 C) (Oral)   Wt 288 lb (130.6 kg)   SpO2 97%   BMI 36.98 kg/m  Physical Exam  Constitutional: He is oriented to person, place, and time. He appears well-developed and well-nourished. No distress.  HENT:  Head: Normocephalic.  Eyes: Conjunctivae are normal.  Cardiovascular: Normal rate.   Pulmonary/Chest: Effort normal.  Musculoskeletal:       Left foot: There is bony tenderness. There is normal range of motion, no tenderness, no swelling, normal capillary refill, no crepitus, no deformity and no laceration.  Neurological: He is alert and oriented to person, place, and time. No cranial nerve deficit.  Skin: Skin is warm and dry. He is not diaphoretic.  Psychiatric: He has a normal mood and affect. His behavior is normal. Judgment and thought content normal.  Nursing note and vitals reviewed.         Assessment & Plan:   Insomnia, unspecified type  Right foot pain - Plan: CANCELED: DG Foot Complete Right  Left foot pain - Plan: DG Foot Complete Left  Type 2 diabetes mellitus with chronic kidney disease, without long-term current use of insulin, unspecified CKD stage ([place]) - Plan: Hemoglobin A1c No Follow-up on file.

## 2016-07-24 NOTE — Assessment & Plan Note (Signed)
New- progressive.  ?OA, poor shoe fit. Given progressive nature, will get xray today.

## 2016-07-24 NOTE — Progress Notes (Signed)
Pre visit review using our clinic review tool, if applicable. No additional management support is needed unless otherwise documented below in the visit note. 

## 2016-07-24 NOTE — Assessment & Plan Note (Signed)
Well controlled. Due for a1c. Will check today.

## 2016-07-25 ENCOUNTER — Other Ambulatory Visit: Payer: Self-pay | Admitting: Family Medicine

## 2016-07-25 DIAGNOSIS — M25572 Pain in left ankle and joints of left foot: Secondary | ICD-10-CM

## 2016-08-03 ENCOUNTER — Ambulatory Visit (INDEPENDENT_AMBULATORY_CARE_PROVIDER_SITE_OTHER): Payer: PPO | Admitting: Podiatry

## 2016-08-03 ENCOUNTER — Encounter: Payer: Self-pay | Admitting: Podiatry

## 2016-08-03 VITALS — BP 110/56 | HR 55 | Temp 98.2°F | Resp 16 | Ht 74.0 in | Wt 288.0 lb

## 2016-08-03 DIAGNOSIS — M779 Enthesopathy, unspecified: Secondary | ICD-10-CM | POA: Diagnosis not present

## 2016-08-03 DIAGNOSIS — R52 Pain, unspecified: Secondary | ICD-10-CM

## 2016-08-03 NOTE — Patient Instructions (Signed)
Peroneal Tendinitis With Rehab- START THESE IN ABOUT 2 WEEKS AS YOUR PAIN IS DECREASING  Tendonitis is inflammation of a tendon. Inflammation of the tendons on the back of the outer ankle (peroneal tendons) is known as peroneal tendonitis. The peroneal tendons are responsible for connecting the muscles that allow you to stand on your tiptoes to the bones of the ankle. For this reason, peroneal tendonitis often causes pain when trying to complete such motions. Peroneal tendonitis often involves a tear (strain) of the peroneal tendons. Strains are classified into three categories. Grade 1 strains cause pain, but the tendon is not lengthened. Grade 2 strains include a lengthened ligament, due to the ligament being stretched or partially ruptured. With grade 2 strains there is still function, although function may be decreased. Grade 3 strains involve a complete tear of the tendon or muscle, and function is usually impaired. SYMPTOMS   Pain, tenderness, swelling, warmth, or redness over the back of the outer side of the ankle, the outer part of the mid-foot, or the bottom of the arch.  Pain that gets worse with ankle motion (especially when pushing off or pushing down with the front of the foot), or when standing on the ball of the foot or pushing the foot outward.  Crackling sound (crepitation) when the tendon is moved or touched. CAUSES  Peroneal tendinitis occurs when injury to the peroneal tendons causes the body to respond with inflammation. Common causes of injury include:  An overuse injury, in which the groove behind the outer ankle (where the tendon is located) causes wear on the tendon.  A sudden stress placed on the tendon, such as from an increase in the intensity, frequency, or duration of training.  Direct hit (trauma) to the tendon.  Return to activity too soon after a previous ankle injury. RISK INCREASES WITH:  Sports that require sudden, repetitive pushing off of the foot, such as  jumping or quick starts.  Kicking and running sports, especially running down hills or long distances.  Poor strength and flexibility.  Previous injury to the foot, ankle, or leg. PREVENTION  Warm up and stretch properly before activity.  Allow for adequate recovery between workouts.  Maintain physical fitness:  Strength, flexibility, and endurance.  Cardiovascular fitness.  Complete rehabilitation after previous injury. PROGNOSIS  If treated properly, peroneal tendonitis usually heals within 6 weeks.  RELATED COMPLICATIONS  Longer healing time, if not properly treated or if not given enough time to heal.  Recurring symptoms if activity is resumed too soon, with overuse, or when using poor technique.  If untreated, tendinitis may result in tendon rupture, requiring surgery. TREATMENT  Treatment first involves the use of ice and medicine to reduce pain and inflammation. The use of strengthening and stretching exercises may help reduce pain with activity. These exercises may be performed at unsuccessful, surgery to remove the inflamed tendon lining (sheath) may be advised.  MEDICATION   If pain medicine is needed, nonsteroidal anti-inflammatory medicines (aspirin and ibuprofen), or other minor pain relievers (acetaminophen), are often advised.  Do not take pain medicine for 7 days before surgery.  Prescription pain relievers may be given, if your caregiver thinks they are needed. Use only as directed and only as much as you need. HEAT AND COLD  Cold treatment (icing) should be applied for 10 to 15 minutes every 2 to 3 hours for inflammation and pain, and immediately after activity that aggravates your symptoms. Use ice packs or an ice massage.  Heat treatment  may be used before performing stretching and strengthening activities prescribed by your caregiver, physical therapist, or athletic trainer. Use a heat pack or a warm water soak. SEEK MEDICAL CARE IF:  Symptoms get  worse or do not improve in 2 to 4 weeks, despite treatment.  New, unexplained symptoms develop. (Drugs used in treatment may produce side effects.) EXERCISES RANGE OF MOTION (ROM) AND STRETCHING EXERCISES - Peroneal Tendinitis These exercises may help you when beginning to rehabilitate your injury. Your symptoms may resolve with or without further involvement from your physician, physical therapist or athletic trainer. While completing these exercises, remember:   Restoring tissue flexibility helps normal motion to return to the joints. This allows healthier, less painful movement and activity.  An effective stretch should be held for at least 30 seconds.  A stretch should never be painful. You should only feel a gentle lengthening or release in the stretched tissue. RANGE OF MOTION - Ankle Eversion  Sit with your right / left ankle crossed over your opposite knee.  Grip your foot with your opposite hand, placing your thumb on the top of your foot and your fingers across the bottom of your foot.  Gently push your foot downward with a slight rotation, so your littlest toes rise slightly toward the ceiling.  You should feel a gentle stretch on the inside of your ankle. Hold the stretch for __________ seconds. Repeat __________ times. Complete this exercise __________ times per day.  RANGE OF MOTION - Ankle Inversion  Sit with your right / left ankle crossed over your opposite knee.  Grip your foot with your opposite hand, placing your thumb on the bottom of your foot and your fingers across the top of your foot.  Gently pull your foot so the smallest toe comes toward you and your thumb pushes the inside of the ball of your foot away from you.  You should feel a gentle stretch on the outside of your ankle. Hold the stretch for __________ seconds. Repeat __________ times. Complete this exercise __________ times per day.  RANGE OF MOTION - Ankle Plantar Flexion  Sit with your right /  left leg crossed over your opposite knee.  Use your opposite hand to pull the top of your foot and toes toward you.  You should feel a gentle stretch on the top of your foot and ankle. Hold this position for __________ seconds. Repeat __________ times. Complete __________ times per day.  STRETCH - Gastroc, Standing  Place your hands on a wall.  Extend your right / left leg behind you, keeping the front knee somewhat bent.  Slightly point your toes inward on your back foot.  Keeping your right / left heel on the floor and your knee straight, shift your weight toward the wall, not allowing your back to arch.  You should feel a gentle stretch in the calf. Hold this position for __________ seconds. Repeat __________ times. Complete this stretch __________ times per day. STRETCH - Soleus, Standing  Place your hands on a wall.  Extend your right / left leg behind you, keeping the other knee somewhat bent.  Slightly point your toes inward on your back foot.  Keep your heel on the floor, bend your back knee, and slightly shift your weight over the back leg so that you feel a gentle stretch deep in your back calf.  Hold this position for __________ seconds. Repeat __________ times. Complete this stretch __________ times per day. STRETCH - Gastrocsoleus, Standing Note: This exercise  can place a lot of stress on your foot and ankle. Please complete this exercise only if specifically instructed by your caregiver.   Place the ball of your right / left foot on a step, keeping your other foot firmly on the same step.  Hold on to the wall or a rail for balance.  Slowly lift your other foot, allowing your body weight to press your heel down over the edge of the step.  You should feel a stretch in your right / left calf.  Hold this position for __________ seconds.  Repeat this exercise with a slight bend in your knee. Repeat __________ times. Complete this stretch __________ times per day.    STRENGTHENING EXERCISES - Peroneal Tendinitis  These exercises may help you when beginning to rehabilitate your injury. They may resolve your symptoms with or without further involvement from your physician, physical therapist or athletic trainer. While completing these exercises, remember:   Muscles can gain both the endurance and the strength needed for everyday activities through controlled exercises.  Complete these exercises as instructed by your physician, physical therapist or athletic trainer. Increase the resistance and repetitions only as guided by your caregiver. STRENGTH - Dorsiflexors  Secure a rubber exercise band or tubing to a fixed object (table, pole) and loop the other end around your right / left foot.  Sit on the floor facing the fixed object. The band should be slightly tense when your foot is relaxed.  Slowly draw your foot back toward you, using your ankle and toes.  Hold this position for __________ seconds. Slowly release the tension in the band and return your foot to the starting position. Repeat __________ times. Complete this exercise __________ times per day.  STRENGTH - Towel Curls  Sit in a chair, on a non-carpeted surface.  Place your foot on a towel, keeping your heel on the floor.  Pull the towel toward your heel only by curling your toes. Keep your heel on the floor.  If instructed by your physician, physical therapist or athletic trainer, add weight to the end of the towel. Repeat __________ times. Complete this exercise __________ times per day. STRENGTH - Ankle Eversion   Secure one end of a rubber exercise band or tubing to a fixed object (table, pole). Loop the other end around your foot, just before your toes.  Place your fists between your knees. This will focus your strengthening at your ankle.  Drawing the band across your opposite foot, away from the pole, slowly, pull your little toe out and up. Make sure the band is positioned to  resist the entire motion.  Hold this position for __________ seconds.  Have your muscles resist the band, as it slowly pulls your foot back to the starting position. Repeat __________ times. Complete this exercise __________ times per day.    This information is not intended to replace advice given to you by your health care provider. Make sure you discuss any questions you have with your health care provider.   Document Released: 09/25/2005 Document Revised: 02/09/2015 Document Reviewed: 01/07/2009 Elsevier Interactive Patient Education [employer].

## 2016-08-03 NOTE — Progress Notes (Signed)
   Subjective:    Patient ID: Corey Bell, male    DOB: 1966-05-08, 50 y.o.   MRN: [id number]  HPI 50 year old male presents the office today for concerns of left foot pain he points the fifth metatarsal base. His been ongoing for 4-5 months and is worse with doing a lot of walking. He's had no recent treatment other than ice and try to change shoes. He states he has had some swelling to the area with denies any redness. No recent injury or trauma. No other complaints at this time.   Review of Systems  HENT: Positive for sinus pressure.   All other systems reviewed and are negative.      Objective:   Physical Exam General: AAO x3, NAD  Dermatological: Skin is warm, dry and supple bilateral. Nails x 10 are well manicured; remaining integument appears unremarkable at this time. There are no open sores, no preulcerative lesions, no rash or signs of infection present.  Vascular: Dorsalis Pedis artery and Posterior Tibial artery pedal pulses are 2/4 bilateral with immedate capillary fill time. here is no pain with calf compression, swelling, warmth, erythema.   Neruologic: Grossly intact via light touch bilateral. Vibratory intact via tuning fork bilateral. Protective threshold with Semmes Wienstein monofilament intact to all pedal sites bilateral.   Musculoskeletal: There is tenderness the fifth metatarsal base on the left foot and along the distal portion the peroneal tendon on the insertion. The peroneal tendon appears to be intact. This booklet edema to this area without any erythema or increase in warmth. MMT 5/5. Range of motion intact. No other areas of tenderness bilaterally.  Gait: Unassisted, Nonantalgic.     Assessment & Plan:  50 year old male left insertional peroneal tendinitis -Treatment options discussed including all alternatives, risks, and complications -Etiology of symptoms were discussed -X-rays were obtained and reviewed with the patient. No evidence of acute  fracture. -Under sterile conditions a mixture Dexon is a local anesthetic was infiltrated the fifth metatarsal base with care not to inject roughly end of the peroneal tendon. Postinjection care was discussed. Plantar fascial braces applied the medial collateral. Ice to the area. -Follow up as scheduled sooner if needed.  Celesta Gentile, DPM

## 2016-09-06 ENCOUNTER — Telehealth: Payer: Self-pay | Admitting: Family Medicine

## 2016-09-06 NOTE — Telephone Encounter (Signed)
Pt will call back to schedule AWV/CPE with Dr. Deborra Medina. Pt declined to schedule with L. Pinson

## 2016-09-07 ENCOUNTER — Ambulatory Visit: Payer: PPO | Admitting: Podiatry

## 2016-09-18 DIAGNOSIS — M791 Myalgia: Secondary | ICD-10-CM | POA: Diagnosis not present

## 2016-09-18 DIAGNOSIS — M5412 Radiculopathy, cervical region: Secondary | ICD-10-CM | POA: Diagnosis not present

## 2016-09-18 DIAGNOSIS — M5416 Radiculopathy, lumbar region: Secondary | ICD-10-CM | POA: Diagnosis not present

## 2016-10-07 ENCOUNTER — Other Ambulatory Visit: Payer: Self-pay | Admitting: Family Medicine

## 2016-10-10 NOTE — Telephone Encounter (Signed)
Last f/u 07/2016

## 2016-10-10 NOTE — Telephone Encounter (Signed)
Rx called in to requested pharmacy 

## 2016-10-16 ENCOUNTER — Other Ambulatory Visit: Payer: Self-pay | Admitting: Family Medicine

## 2016-11-07 ENCOUNTER — Other Ambulatory Visit: Payer: Self-pay | Admitting: Family Medicine

## 2016-11-21 DIAGNOSIS — G4733 Obstructive sleep apnea (adult) (pediatric): Secondary | ICD-10-CM | POA: Diagnosis not present

## 2016-11-27 ENCOUNTER — Other Ambulatory Visit: Payer: Self-pay | Admitting: Family Medicine

## 2016-11-29 ENCOUNTER — Ambulatory Visit (INDEPENDENT_AMBULATORY_CARE_PROVIDER_SITE_OTHER): Payer: PPO | Admitting: Family Medicine

## 2016-11-29 ENCOUNTER — Encounter: Payer: Self-pay | Admitting: Family Medicine

## 2016-11-29 NOTE — Assessment & Plan Note (Signed)
Referral placed to discuss surgical options. The patient indicates understanding of these issues and agrees with the plan. Orders Placed This Encounter  Procedures  . Ambulatory referral to General Surgery

## 2016-11-29 NOTE — Progress Notes (Signed)
Subjective:   Patient ID: Corey Bell, male    DOB: Apr 30, 1966, 51 y.o.   MRN: [id number]  Corey Bell is a pleasant 52 y.o. year old male who presents to clinic today with Medication Management  on 11/29/2016  HPI:  He has considered bariatric surgery and ready to consider this option.  Feels he has tried "every diet."  Wants to consider gastric sleeve.  Current Outpatient Prescriptions on File Prior to Visit  Medication Sig Dispense Refill  . aspirin 325 MG tablet Take 325 mg by mouth daily. 2 TABS ONCE PO    . buPROPion (WELLBUTRIN XL) 300 MG 24 hr tablet TAKE 1 TABLET BY MOUTH EVERY DAY. 90 tablet 1  . cholecalciferol (VITAMIN D) 1000 units tablet Take 1,000 Units by mouth daily.    . citalopram (CELEXA) 40 MG tablet TAKE 1 TABLET BY MOUTH EVERY DAY. 90 tablet 1  . cyclobenzaprine (FLEXERIL) 5 MG tablet Take 1 tablet (5 mg total) by mouth 3 (three) times daily as needed for muscle spasms. 30 tablet 1  . erythromycin with ethanol (THERAMYCIN) 2 % external solution APPLY TWICE A DAY 60 mL 2  . ezetimibe (ZETIA) 10 MG tablet Take 1 tablet (10 mg total) by mouth daily. 30 tablet 6  . fexofenadine (ALLEGRA) 180 MG tablet Take 180 mg by mouth daily.    Marland Kitchen FREESTYLE LITE test strip USE AS DIRECTED 50 each 5  . gabapentin (NEURONTIN) 800 MG tablet Take 800 mg by mouth 4 (four) times daily.     Marland Kitchen glucose monitoring kit (FREESTYLE) monitoring kit 1 each by Does not apply route as needed for other. Use as directed 1 each 0  . ibuprofen (ADVIL,MOTRIN) 600 MG tablet Take 600 mg by mouth as needed.     Marland Kitchen levothyroxine (SYNTHROID, LEVOTHROID) 75 MCG tablet Take 1 tablet (75 mcg total) by mouth daily. OFFICE VISIT WITH LABS REQUIRED FOR ADDITIONAL REFILLS 30 tablet 0  . lisinopril (PRINIVIL,ZESTRIL) 10 MG tablet TAKE 1 TABLET BY MOUTH EVERY DAY. 90 tablet 1  . metFORMIN (GLUCOPHAGE) 500 MG tablet TAKE ONE TABLET BY MOUTH EVERY DAY WITH BREAKFAST 90 tablet 1  . methocarbamol (ROBAXIN) 500  MG tablet Take 1,000 mg by mouth every 6 (six) hours as needed for muscle spasms.     . metoprolol succinate (TOPROL-XL) 50 MG 24 hr tablet TAKE 1 TABLET BY MOUTH EVERY DAY. 90 tablet 1  . montelukast (SINGULAIR) 10 MG tablet TAKE ONE TABLET BY MOUTH EVERY NIGHT AT BEDTIME 90 tablet 1  . ONE TOUCH LANCETS MISC Check blood sugar once daily and as directed. Dx. E11.9 200 each 1  . oxyCODONE-acetaminophen (PERCOCET/ROXICET) 5-325 MG tablet Take 1 tablet by mouth every 6 (six) hours as needed for severe pain. 30 tablet 0  . ranitidine (ZANTAC) 150 MG tablet Take 150 mg by mouth 2 (two) times daily.    . traMADol (ULTRAM) 50 MG tablet TAKE 1 TABLET BY MOUTH EVERY 8 HOURS AS NEEDED FOR MODERATE TO SEVEREPAIN 30 tablet 0  . vitamin B-12 (CYANOCOBALAMIN) 1000 MCG tablet Take 1,000 mcg by mouth daily.     No current facility-administered medications on file prior to visit.     Allergies  Allergen Reactions  . Augmentin [Amoxicillin-Pot Clavulanate] Diarrhea  . Demerol [Meperidine]     "goes crazy and has the strength of 10 men"  . Eggs Or Egg-Derived Products Diarrhea and Nausea And Vomiting  . Sulfamethoxazole Other (See Comments)    Blisters form  in mouth  . Statins Rash  . Sulfa Antibiotics Rash  . Sulfacetamide Sodium Rash    Past Medical History:  Diagnosis Date  . Anxiety   . Aphthous ulcer 10-04-12   improving  . Carpal tunnel syndrome, bilateral   . Chronic pain syndrome   . COPD (chronic obstructive pulmonary disease) ([place])   . Depression   . Diverticulitis of colon   . Esophageal reflux   . H/O acute prostatitis   . Headache(784.0)    hx of migraines  . Hypothyroidism   . Insomnia, unspecified   . Lumbar degenerative disc disease   . Mixed hyperlipidemia   . Peyronie disease 12-20-12   PER uROLOGY  . Seasonal allergies   . Sleep apnea    uses cpap  . Tympanic membrane perforation    history of  . Unspecified essential hypertension     Past Surgical History:    Procedure Laterality Date  . LumbarSacral Disc Surgery     L5-S1  . MENISCUS REPAIR Left    torn meniscus  . NESBIT PROCEDURE N/A 08/22/2013   Procedure: 16 DOT PLACTATION;  Surgeon: Claybon Jabs, MD;  Location: [facility];  Service: Urology;  Laterality: N/A;  . TYMPANOSTOMY TUBE PLACEMENT  1975  . VASECTOMY Bilateral 08/22/2013   Procedure: VASECTOMY;  Surgeon: Claybon Jabs, MD;  Location: [facility];  Service: Urology;  Laterality: Bilateral;    Family History  Problem Relation Age of Onset  . Bladder Cancer Paternal Uncle   . Renal cancer Paternal Uncle     renal cell carcinoma  . Congestive Heart Failure Father   . Heart disease Father   . COPD Father   . Renal cancer Paternal Grandfather     renal call carcinoma  . Heart disease Brother     stents at 25 yo  . Parkinsonism Neg Hx     Social History   Social History  . Marital status: Married    Spouse name: Corey Bell  . Number of children: 0  . Years of education: college   Occupational History  . disabled Nurse, mental health  .  Unemployed   Social History Main Topics  . Smoking status: Former Smoker    Packs/day: 1.50    Years: 30.00    Types: Cigarettes    Quit date: 10/10/2007  . Smokeless tobacco: Never Used  . Alcohol use 0.6 oz/week    1 Cans of beer per week     Comment: rarely  . Drug use: No  . Sexual activity: Not on file   Other Topics Concern  . Not on file   Social History Narrative   Married.  Corey Bell)  No children. Patient has Best boy.   In process of getting disability for low back pain.   Right handed.   Caffeine- Coffee a pot but not every day.   The PMH, PSH, Social History, Family History, Medications, and allergies have been reviewed in [facility], and have been updated if relevant.   Review of Systems  Cardiovascular: Negative.   Gastrointestinal: Negative.   Psychiatric/Behavioral: Negative.   All other systems reviewed and are negative.       Objective:    BP 122/70   Pulse 62   Wt 296 lb (134.3 kg)   SpO2 97%   BMI 38.00 kg/m   Wt Readings from Last 3 Encounters:  11/29/16 296 lb (134.3 kg)  08/03/16 288 lb (130.6 kg)  07/24/16 288 lb (130.6  kg)     Physical Exam  Constitutional: He is oriented to person, place, and time. He appears well-developed and well-nourished. No distress.  HENT:  Head: Normocephalic and atraumatic.  Eyes: Conjunctivae are normal.  Cardiovascular: Normal rate.   Pulmonary/Chest: Effort normal.  Musculoskeletal: Normal range of motion.  Neurological: He is alert and oriented to person, place, and time. No cranial nerve deficit.  Skin: Skin is warm and dry. He is not diaphoretic.  Psychiatric: He has a normal mood and affect. His behavior is normal. Judgment and thought content normal.  Nursing note and vitals reviewed.         Assessment & Plan:   Morbid obesity ([place]) - Plan: Ambulatory referral to General Surgery No Follow-up on file.

## 2016-11-29 NOTE — Patient Instructions (Signed)
Great to see you. Please stop by to see Corey Bell on your way out.   

## 2016-12-18 ENCOUNTER — Other Ambulatory Visit: Payer: Self-pay | Admitting: Family Medicine

## 2016-12-26 ENCOUNTER — Encounter: Payer: Self-pay | Admitting: Family Medicine

## 2016-12-26 ENCOUNTER — Other Ambulatory Visit: Payer: Self-pay | Admitting: Family Medicine

## 2016-12-26 ENCOUNTER — Ambulatory Visit (INDEPENDENT_AMBULATORY_CARE_PROVIDER_SITE_OTHER): Payer: PPO | Admitting: Family Medicine

## 2016-12-26 VITALS — BP 120/82 | HR 88 | Temp 97.5°F | Ht 74.0 in | Wt 295.0 lb

## 2016-12-26 DIAGNOSIS — H6591 Unspecified nonsuppurative otitis media, right ear: Secondary | ICD-10-CM | POA: Diagnosis not present

## 2016-12-26 MED ORDER — AZITHROMYCIN 250 MG PO TABS: ORAL_TABLET | ORAL | 0 refills | Status: DC

## 2016-12-26 NOTE — Patient Instructions (Signed)
Great to see you. Please take zpack as directed.   

## 2016-12-26 NOTE — Progress Notes (Signed)
SUBJECTIVE:  Corey Bell is a 51 y.o. male who complains of coryza, congestion and sore throat for 7 days. He denies a history of anorexia, chest pain and fatigue and denies a history of asthma. Patient denies smoke cigarettes.   Current Outpatient Prescriptions on File Prior to Visit  Medication Sig Dispense Refill  . aspirin 325 MG tablet Take 325 mg by mouth daily. 2 TABS ONCE PO    . buPROPion (WELLBUTRIN XL) 300 MG 24 hr tablet TAKE 1 TABLET BY MOUTH EVERY DAY. 90 tablet 1  . cholecalciferol (VITAMIN D) 1000 units tablet Take 1,000 Units by mouth daily.    . citalopram (CELEXA) 40 MG tablet TAKE 1 TABLET BY MOUTH EVERY DAY. 90 tablet 0  . cyclobenzaprine (FLEXERIL) 5 MG tablet Take 1 tablet (5 mg total) by mouth 3 (three) times daily as needed for muscle spasms. 30 tablet 1  . erythromycin with ethanol (THERAMYCIN) 2 % external solution APPLY TWICE A DAY 60 mL 2  . ezetimibe (ZETIA) 10 MG tablet Take 1 tablet (10 mg total) by mouth daily. 30 tablet 6  . fexofenadine (ALLEGRA) 180 MG tablet Take 180 mg by mouth daily.    Marland Kitchen FREESTYLE LITE test strip USE AS DIRECTED 50 each 5  . gabapentin (NEURONTIN) 800 MG tablet Take 800 mg by mouth 4 (four) times daily.     Marland Kitchen glucose monitoring kit (FREESTYLE) monitoring kit 1 each by Does not apply route as needed for other. Use as directed 1 each 0  . ibuprofen (ADVIL,MOTRIN) 600 MG tablet Take 600 mg by mouth as needed.     Marland Kitchen levothyroxine (SYNTHROID, LEVOTHROID) 75 MCG tablet Take 1 tablet (75 mcg total) by mouth daily. OFFICE VISIT WITH LABS REQUIRED FOR ADDITIONAL REFILLS 30 tablet 0  . lisinopril (PRINIVIL,ZESTRIL) 10 MG tablet TAKE 1 TABLET BY MOUTH EVERY DAY. 90 tablet 1  . metFORMIN (GLUCOPHAGE) 500 MG tablet TAKE 1 TABLET BY MOUTH EVERY DAY WITH BREAKFAST 90 tablet 0  . methocarbamol (ROBAXIN) 500 MG tablet Take 1,000 mg by mouth every 6 (six) hours as needed for muscle spasms.     . metoprolol succinate (TOPROL-XL) 50 MG 24 hr tablet TAKE 1  TABLET BY MOUTH EVERY DAY. 90 tablet 1  . montelukast (SINGULAIR) 10 MG tablet TAKE ONE TABLET BY MOUTH EVERY NIGHT AT BEDTIME 90 tablet 1  . ONE TOUCH LANCETS MISC Check blood sugar once daily and as directed. Dx. E11.9 200 each 1  . oxyCODONE-acetaminophen (PERCOCET/ROXICET) 5-325 MG tablet Take 1 tablet by mouth every 6 (six) hours as needed for severe pain. 30 tablet 0  . ranitidine (ZANTAC) 150 MG tablet Take 150 mg by mouth 2 (two) times daily.    . traMADol (ULTRAM) 50 MG tablet TAKE 1 TABLET BY MOUTH EVERY 8 HOURS AS NEEDED FOR MODERATE TO SEVEREPAIN 30 tablet 0  . vitamin B-12 (CYANOCOBALAMIN) 1000 MCG tablet Take 1,000 mcg by mouth daily.     No current facility-administered medications on file prior to visit.     Allergies  Allergen Reactions  . Augmentin [Amoxicillin-Pot Clavulanate] Diarrhea  . Demerol [Meperidine]     "goes crazy and has the strength of 10 men"  . Eggs Or Egg-Derived Products Diarrhea and Nausea And Vomiting  . Sulfamethoxazole Other (See Comments)    Blisters form in mouth  . Statins Rash  . Sulfa Antibiotics Rash  . Sulfacetamide Sodium Rash    Past Medical History:  Diagnosis Date  . Anxiety   .  Aphthous ulcer 10-04-12   improving  . Carpal tunnel syndrome, bilateral   . Chronic pain syndrome   . COPD (chronic obstructive pulmonary disease) ([place])   . Depression   . Diverticulitis of colon   . Esophageal reflux   . H/O acute prostatitis   . Headache(784.0)    hx of migraines  . Hypothyroidism   . Insomnia, unspecified   . Lumbar degenerative disc disease   . Mixed hyperlipidemia   . Peyronie disease 12-20-12   PER uROLOGY  . Seasonal allergies   . Sleep apnea    uses cpap  . Tympanic membrane perforation    history of  . Unspecified essential hypertension     Past Surgical History:  Procedure Laterality Date  . LumbarSacral Disc Surgery     L5-S1  . MENISCUS REPAIR Left    torn meniscus  . NESBIT PROCEDURE N/A 08/22/2013    Procedure: 16 DOT PLACTATION;  Surgeon: Claybon Jabs, MD;  Location: [facility];  Service: Urology;  Laterality: N/A;  . TYMPANOSTOMY TUBE PLACEMENT  1975  . VASECTOMY Bilateral 08/22/2013   Procedure: VASECTOMY;  Surgeon: Claybon Jabs, MD;  Location[facility];  Service: Urology;  Laterality: Bilateral;    Family History  Problem Relation Age of Onset  . Bladder Cancer Paternal Uncle   . Renal cancer Paternal Uncle     renal cell carcinoma  . Congestive Heart Failure Father   . Heart disease Father   . COPD Father   . Renal cancer Paternal Grandfather     renal call carcinoma  . Heart disease Brother     stents at 56 yo  . Parkinsonism Neg Hx     Social History   Social History  . Marital status: Married    Spouse name: [name]  . Number of children: 0  . Years of education: college   Occupational History  . disabled Nurse, mental health  .  Unemployed   Social History Main Topics  . Smoking status: Former Smoker    Packs/day: 1.50    Years: 30.00    Types: Cigarettes    Quit date: 10/10/2007  . Smokeless tobacco: Never Used  . Alcohol use 0.6 oz/week    1 Cans of beer per week     Comment: rarely  . Drug use: No  . Sexual activity: Not on file   Other Topics Concern  . Not on file   Social History Narrative   Married.  [name])  No children. Patient has Best boy.   In process of getting disability for low back pain.   Right handed.   Caffeine- Coffee a pot but not every day.   The PMH, PSH, Social History, Family History, Medications, and allergies have been reviewed in [facility], and have been updated if relevant.  OBJECTIVE: BP 120/82   Pulse 88   Temp 97.5 F (36.4 C)   Ht 6' 2"  (1.88 m)   Wt 295 lb (133.8 kg)   SpO2 98%   BMI 37.88 kg/m   He appears well, vital signs are as noted. Right TM bulging, left ear normal.  Throat and pharynx normal.  Neck supple. No adenopathy in the neck. Nose is congested. Sinuses non  tender. The chest is clear, without wheezes or rales.  ASSESSMENT:  Otitis media  PLAN: PCN allergic.  Symptomatic therapy suggested: push fluids, rest and return office visit prn if symptoms persist or worsen. Call or return to clinic prn  if these symptoms worsen or fail to improve as anticipated.

## 2016-12-28 NOTE — Telephone Encounter (Signed)
Pt left v/m requesting status of refills for bupropion,lisinopril and levothyroxine to [facility]. Pt request to be refilled. Pt last f/u appt 12/06/15 but pt has appt 01/25/17 at 10:30 with Dr Deborra Medina.  I spoke with pts wife (DPR signed) and advised refilled to [facility] and pt will keep appt on 01/25/17.

## 2017-01-04 ENCOUNTER — Ambulatory Visit (INDEPENDENT_AMBULATORY_CARE_PROVIDER_SITE_OTHER): Payer: PPO | Admitting: Family Medicine

## 2017-01-04 ENCOUNTER — Encounter: Payer: Self-pay | Admitting: Family Medicine

## 2017-01-04 DIAGNOSIS — H9203 Otalgia, bilateral: Secondary | ICD-10-CM | POA: Diagnosis not present

## 2017-01-04 MED ORDER — METFORMIN HCL 500 MG PO TABS: ORAL_TABLET | ORAL | 6 refills | Status: DC

## 2017-01-04 MED ORDER — LEVOTHYROXINE SODIUM 75 MCG PO TABS
75.0000 ug | ORAL_TABLET | Freq: Every day | ORAL | 6 refills | Status: DC
Start: 2017-01-04 — End: 2017-09-06

## 2017-01-04 MED ORDER — LISINOPRIL 10 MG PO TABS: 10.0000 mg | ORAL_TABLET | Freq: Every day | ORAL | 6 refills | Status: DC

## 2017-01-04 MED ORDER — OXYCODONE-ACETAMINOPHEN 5-325 MG PO TABS: 1.0000 | ORAL_TABLET | Freq: Four times a day (QID) | ORAL | 0 refills | Status: DC | PRN

## 2017-01-04 MED ORDER — CITALOPRAM HYDROBROMIDE 40 MG PO TABS: 40.0000 mg | ORAL_TABLET | Freq: Every day | ORAL | 6 refills | Status: DC

## 2017-01-04 MED ORDER — TRAMADOL HCL 50 MG PO TABS: ORAL_TABLET | ORAL | 0 refills | Status: DC

## 2017-01-04 MED ORDER — BUPROPION HCL ER (XL) 300 MG PO TB24: 300.0000 mg | ORAL_TABLET | Freq: Every day | ORAL | 6 refills | Status: DC

## 2017-01-04 NOTE — Assessment & Plan Note (Signed)
Exam reassuring.  Reassurance provided to pt.  Continue supportive care with as needed decongestants. Call or return to clinic prn if these symptoms worsen or fail to improve as anticipated. The patient indicates understanding of these issues and agrees with the plan.

## 2017-01-04 NOTE — Progress Notes (Signed)
Subjective:   Patient ID: Corey Bell, male    DOB: 1966-03-10, 51 y.o.   MRN: [id number]  Corey Bell is a pleasant 51 y.o. year old male who presents to clinic today with Ear Pain (pt stated still having problem B/L ears especially left ear)  on 01/04/2017  HPI:   Saw patient on 12/26/16 for URI symptoms.  Note reviewed. Placed on zpack (numerous drug allergies including PCNs). Symptoms over all improved significantly but still having bilateral ear pain, left greater than right.      Current Outpatient Prescriptions on File Prior to Visit  Medication Sig Dispense Refill  . aspirin 325 MG tablet Take 325 mg by mouth daily. 2 TABS ONCE PO    . cholecalciferol (VITAMIN D) 1000 units tablet Take 1,000 Units by mouth daily.    . cyclobenzaprine (FLEXERIL) 5 MG tablet Take 1 tablet (5 mg total) by mouth 3 (three) times daily as needed for muscle spasms. 30 tablet 1  . erythromycin with ethanol (THERAMYCIN) 2 % external solution APPLY TWICE A DAY 60 mL 2  . ezetimibe (ZETIA) 10 MG tablet Take 1 tablet (10 mg total) by mouth daily. 30 tablet 6  . fexofenadine (ALLEGRA) 180 MG tablet Take 180 mg by mouth daily.    Marland Kitchen FREESTYLE LITE test strip USE AS DIRECTED 50 each 5  . gabapentin (NEURONTIN) 800 MG tablet Take 800 mg by mouth 4 (four) times daily.     Marland Kitchen glucose monitoring kit (FREESTYLE) monitoring kit 1 each by Does not apply route as needed for other. Use as directed 1 each 0  . ibuprofen (ADVIL,MOTRIN) 600 MG tablet Take 600 mg by mouth as needed.     . methocarbamol (ROBAXIN) 500 MG tablet Take 1,000 mg by mouth every 6 (six) hours as needed for muscle spasms.     . metoprolol succinate (TOPROL-XL) 50 MG 24 hr tablet TAKE 1 TABLET BY MOUTH EVERY DAY. 90 tablet 1  . montelukast (SINGULAIR) 10 MG tablet TAKE ONE TABLET BY MOUTH EVERY NIGHT AT BEDTIME 90 tablet 1  . ONE TOUCH LANCETS MISC Check blood sugar once daily and as directed. Dx. E11.9 200 each 1  . ranitidine (ZANTAC) 150  MG tablet Take 150 mg by mouth 2 (two) times daily.    . vitamin B-12 (CYANOCOBALAMIN) 1000 MCG tablet Take 1,000 mcg by mouth daily.     No current facility-administered medications on file prior to visit.     Allergies  Allergen Reactions  . Augmentin [Amoxicillin-Pot Clavulanate] Diarrhea  . Demerol [Meperidine]     "goes crazy and has the strength of 10 men"  . Eggs Or Egg-Derived Products Diarrhea and Nausea And Vomiting  . Sulfamethoxazole Other (See Comments)    Blisters form in mouth  . Statins Rash  . Sulfa Antibiotics Rash  . Sulfacetamide Sodium Rash    Past Medical History:  Diagnosis Date  . Anxiety   . Aphthous ulcer 10-04-12   improving  . Carpal tunnel syndrome, bilateral   . Chronic pain syndrome   . COPD (chronic obstructive pulmonary disease) ([place])   . Depression   . Diverticulitis of colon   . Esophageal reflux   . H/O acute prostatitis   . Headache(784.0)    hx of migraines  . Hypothyroidism   . Insomnia, unspecified   . Lumbar degenerative disc disease   . Mixed hyperlipidemia   . Peyronie disease 12-20-12   PER uROLOGY  . Seasonal allergies   .  Sleep apnea    uses cpap  . Tympanic membrane perforation    history of  . Unspecified essential hypertension     Past Surgical History:  Procedure Laterality Date  . LumbarSacral Disc Surgery     L5-S1  . MENISCUS REPAIR Left    torn meniscus  . NESBIT PROCEDURE N/A 08/22/2013   Procedure: 16 DOT PLACTATION;  Surgeon: Claybon Jabs, MD;  Location: [facility];  Service: Urology;  Laterality: N/A;  . TYMPANOSTOMY TUBE PLACEMENT  1975  . VASECTOMY Bilateral 08/22/2013   Procedure: VASECTOMY;  Surgeon: Claybon Jabs, MD;  [facility];  Service: Urology;  Laterality: Bilateral;    Family History  Problem Relation Age of Onset  . Bladder Cancer Paternal Uncle   . Renal cancer Paternal Uncle     renal cell carcinoma  . Congestive Heart Failure Father     . Heart disease Father   . COPD Father   . Renal cancer Paternal Grandfather     renal call carcinoma  . Heart disease Brother     stents at 75 yo  . Parkinsonism Neg Hx     Social History   Social History  . Marital status: Married    Spouse name: [name]  . Number of children: 0  . Years of education: college   Occupational History  . disabled Nurse, mental health  .  Unemployed   Social History Main Topics  . Smoking status: Former Smoker    Packs/day: 1.50    Years: 30.00    Types: Cigarettes    Quit date: 10/10/2007  . Smokeless tobacco: Never Used  . Alcohol use 0.6 oz/week    1 Cans of beer per week     Comment: rarely  . Drug use: No  . Sexual activity: Not on file   Other Topics Concern  . Not on file   Social History Narrative   Married.  [name])  No children. Patient has Best boy.   In process of getting disability for low back pain.   Right handed.   Caffeine- Coffee a pot but not every day.   The PMH, PSH, Social History, Family History, Medications, and allergies have been reviewed in [facility], and have been updated if relevant.   Review of Systems  Constitutional: Negative.   HENT: Positive for ear pain. Negative for congestion, ear discharge, facial swelling, hearing loss, mouth sores, nosebleeds, rhinorrhea, sinus pain, sinus pressure, sneezing, sore throat, tinnitus, trouble swallowing and voice change.   Respiratory: Negative.   Cardiovascular: Negative.   Gastrointestinal: Negative.   Musculoskeletal: Negative.   Neurological: Negative.   All other systems reviewed and are negative.      Objective:    BP 128/88   Pulse 88   Temp 98.6 F (37 C)   Wt 298 lb (135.2 kg)   SpO2 99%   BMI 38.26 kg/m    Physical Exam  Constitutional: He is oriented to person, place, and time. He appears well-developed and well-nourished. No distress.  HENT:  Head: Normocephalic and atraumatic.  Left TM retracted, otherwise unremarkable exam  Eyes:  Conjunctivae are normal.  Cardiovascular: Normal rate.   Pulmonary/Chest: Effort normal.  Musculoskeletal: Normal range of motion.  Neurological: He is alert and oriented to person, place, and time.  Skin: Skin is warm and dry. He is not diaphoretic.  Psychiatric: He has a normal mood and affect. His behavior is normal. Thought content normal.  Nursing note and  vitals reviewed.         Assessment & Plan:   Ear pain, bilateral No Follow-up on file.

## 2017-01-11 DIAGNOSIS — E1169 Type 2 diabetes mellitus with other specified complication: Secondary | ICD-10-CM | POA: Diagnosis not present

## 2017-01-11 DIAGNOSIS — E669 Obesity, unspecified: Secondary | ICD-10-CM | POA: Diagnosis not present

## 2017-01-11 DIAGNOSIS — Z9989 Dependence on other enabling machines and devices: Secondary | ICD-10-CM | POA: Diagnosis not present

## 2017-01-11 DIAGNOSIS — G4733 Obstructive sleep apnea (adult) (pediatric): Secondary | ICD-10-CM | POA: Diagnosis not present

## 2017-01-15 ENCOUNTER — Other Ambulatory Visit (HOSPITAL_COMMUNITY): Payer: Self-pay | Admitting: Surgery

## 2017-01-15 DIAGNOSIS — H2513 Age-related nuclear cataract, bilateral: Secondary | ICD-10-CM | POA: Diagnosis not present

## 2017-01-15 DIAGNOSIS — H5213 Myopia, bilateral: Secondary | ICD-10-CM | POA: Diagnosis not present

## 2017-01-15 DIAGNOSIS — E119 Type 2 diabetes mellitus without complications: Secondary | ICD-10-CM | POA: Diagnosis not present

## 2017-01-15 LAB — HM DIABETES EYE EXAM

## 2017-01-16 ENCOUNTER — Telehealth: Payer: Self-pay | Admitting: Family Medicine

## 2017-01-16 NOTE — Telephone Encounter (Signed)
Lm on pts vm and advised ok to schedule lab appt

## 2017-01-16 NOTE — Telephone Encounter (Signed)
Pt called back and sch labs on 4/12 @ 9:05 am, needs orders.

## 2017-01-16 NOTE — Telephone Encounter (Signed)
Patient has a 2 mth follow up with Dr.Aron on 01/25/17.  Patient wants to know if Dr. Deborra Medina wants him to have lab work done before the appointment. Patient said a detailed message can be left on his voice mail.

## 2017-01-16 NOTE — Telephone Encounter (Signed)
Yes ok to schedule labs prior to appt.

## 2017-01-17 DIAGNOSIS — M5412 Radiculopathy, cervical region: Secondary | ICD-10-CM | POA: Diagnosis not present

## 2017-01-17 DIAGNOSIS — M791 Myalgia: Secondary | ICD-10-CM | POA: Diagnosis not present

## 2017-01-18 ENCOUNTER — Other Ambulatory Visit (INDEPENDENT_AMBULATORY_CARE_PROVIDER_SITE_OTHER): Payer: PPO

## 2017-01-18 ENCOUNTER — Other Ambulatory Visit: Payer: Self-pay | Admitting: Family Medicine

## 2017-01-18 DIAGNOSIS — Z0289 Encounter for other administrative examinations: Secondary | ICD-10-CM

## 2017-01-18 DIAGNOSIS — E119 Type 2 diabetes mellitus without complications: Secondary | ICD-10-CM | POA: Diagnosis not present

## 2017-01-18 LAB — TSH: TSH: 1.56 u[IU]/mL (ref 0.35–4.50)

## 2017-01-18 LAB — HEMOGLOBIN A1C: Hgb A1c MFr Bld: 6.5 % (ref 4.6–6.5)

## 2017-01-23 DIAGNOSIS — L720 Epidermal cyst: Secondary | ICD-10-CM | POA: Diagnosis not present

## 2017-01-23 DIAGNOSIS — D225 Melanocytic nevi of trunk: Secondary | ICD-10-CM | POA: Diagnosis not present

## 2017-01-23 DIAGNOSIS — L821 Other seborrheic keratosis: Secondary | ICD-10-CM | POA: Diagnosis not present

## 2017-01-25 ENCOUNTER — Ambulatory Visit (INDEPENDENT_AMBULATORY_CARE_PROVIDER_SITE_OTHER): Payer: PPO | Admitting: Family Medicine

## 2017-01-25 ENCOUNTER — Encounter: Payer: Self-pay | Admitting: Family Medicine

## 2017-01-25 VITALS — BP 128/72 | HR 65 | Temp 98.0°F | Wt 293.0 lb

## 2017-01-25 DIAGNOSIS — E785 Hyperlipidemia, unspecified: Secondary | ICD-10-CM

## 2017-01-25 DIAGNOSIS — I10 Essential (primary) hypertension: Secondary | ICD-10-CM

## 2017-01-25 DIAGNOSIS — E119 Type 2 diabetes mellitus without complications: Secondary | ICD-10-CM | POA: Diagnosis not present

## 2017-01-25 DIAGNOSIS — E038 Other specified hypothyroidism: Secondary | ICD-10-CM | POA: Diagnosis not present

## 2017-01-25 NOTE — Progress Notes (Deleted)
Subjective:   Patient ID: Corey Bell, male    DOB: 1966/03/05, 51 y.o.   MRN: [id number]  Corey Bell is a pleasant 51 y.o. year old male who presents with his wife, Corey Bell, to clinic today with Follow-up (Diabetes. Average blood sugar at home low 100s fasting)  on 01/25/2017  HPI: DM- diagnosed in August 2015.   Lab Results  Component Value Date   HGBA1C 6.5 01/18/2017   No Family h/o DM.  Has been well controlled.  Brings his log in again today.  Checking it twice daily.  Has been working on his weight.  Also starting the process for bariatric surgery.  Has an appointment with a nutritionist and psychologist with bariatric program. Has been trying to do more physical activity and has lost a few pounds since I saw him.  Wt Readings from Last 3 Encounters:  01/25/17 293 lb (132.9 kg)  01/04/17 298 lb (135.2 kg)  12/26/16 295 lb (133.8 kg)     Started on Metformin 500 mg daily on 06/03/14.   Has been intolerant to multiple statins but he is on zetia- LDL at goal for diabetic. Lab Results  Component Value Date   CHOL 143 10/26/2015   HDL 29 (L) 10/26/2015   LDLCALC 66 10/26/2015   LDLDIRECT 93.0 04/07/2015   TRIG 239 (H) 10/26/2015   CHOLHDL 4.9 10/26/2015    Hypothyroidism- taking synthroid 75 mcg daily. Denies any symptoms of hypo or hyperthyroidism other than persistent fatigue.  CPAP does seem to help.  Lab Results  Component Value Date   TSH 1.56 01/18/2017     Current Outpatient Prescriptions on File Prior to Visit  Medication Sig Dispense Refill  . aspirin 325 MG tablet Take 325 mg by mouth daily. 2 TABS ONCE PO    . buPROPion (WELLBUTRIN XL) 300 MG 24 hr tablet Take 1 tablet (300 mg total) by mouth daily. 30 tablet 6  . cholecalciferol (VITAMIN D) 1000 units tablet Take 1,000 Units by mouth daily.    . citalopram (CELEXA) 40 MG tablet Take 1 tablet (40 mg total) by mouth daily. 30 tablet 6  . cyclobenzaprine (FLEXERIL) 5 MG tablet Take 1 tablet (5 mg  total) by mouth 3 (three) times daily as needed for muscle spasms. 30 tablet 1  . erythromycin with ethanol (THERAMYCIN) 2 % external solution APPLY TWICE A DAY 60 mL 2  . ezetimibe (ZETIA) 10 MG tablet Take 1 tablet (10 mg total) by mouth daily. 30 tablet 6  . fexofenadine (ALLEGRA) 180 MG tablet Take 180 mg by mouth daily.    Marland Kitchen FREESTYLE LITE test strip USE AS DIRECTED 50 each 5  . gabapentin (NEURONTIN) 800 MG tablet Take 800 mg by mouth 4 (four) times daily.     Marland Kitchen glucose monitoring kit (FREESTYLE) monitoring kit 1 each by Does not apply route as needed for other. Use as directed 1 each 0  . ibuprofen (ADVIL,MOTRIN) 600 MG tablet Take 600 mg by mouth as needed.     Marland Kitchen levothyroxine (SYNTHROID, LEVOTHROID) 75 MCG tablet Take 1 tablet (75 mcg total) by mouth daily. 30 tablet 6  . lisinopril (PRINIVIL,ZESTRIL) 10 MG tablet Take 1 tablet (10 mg total) by mouth daily. 30 tablet 6  . metFORMIN (GLUCOPHAGE) 500 MG tablet TAKE 1 TABLET BY MOUTH EVERY DAY WITH BREAKFAST 30 tablet 6  . methocarbamol (ROBAXIN) 500 MG tablet Take 1,000 mg by mouth every 6 (six) hours as needed for muscle spasms.     Marland Kitchen  metoprolol succinate (TOPROL-XL) 50 MG 24 hr tablet TAKE 1 TABLET BY MOUTH EVERY DAY. 90 tablet 1  . montelukast (SINGULAIR) 10 MG tablet TAKE ONE TABLET BY MOUTH EVERY NIGHT AT BEDTIME 90 tablet 1  . ONE TOUCH LANCETS MISC Check blood sugar once daily and as directed. Dx. E11.9 200 each 1  . oxyCODONE-acetaminophen (PERCOCET/ROXICET) 5-325 MG tablet Take 1 tablet by mouth every 6 (six) hours as needed for severe pain. 30 tablet 0  . ranitidine (ZANTAC) 150 MG tablet Take 150 mg by mouth 2 (two) times daily.    . traMADol (ULTRAM) 50 MG tablet TAKE 1 TABLET BY MOUTH EVERY 8 HOURS AS NEEDED FOR MODERATE TO SEVEREPAIN 30 tablet 0  . vitamin B-12 (CYANOCOBALAMIN) 1000 MCG tablet Take 1,000 mcg by mouth daily.     No current facility-administered medications on file prior to visit.     Allergies  Allergen  Reactions  . Augmentin [Amoxicillin-Pot Clavulanate] Diarrhea  . Demerol [Meperidine]     "goes crazy and has the strength of 10 men"  . Eggs Or Egg-Derived Products Diarrhea and Nausea And Vomiting  . Sulfamethoxazole Other (See Comments)    Blisters form in mouth  . Statins Rash  . Sulfa Antibiotics Rash  . Sulfacetamide Sodium Rash    Past Medical History:  Diagnosis Date  . Anxiety   . Aphthous ulcer 10-04-12   improving  . Carpal tunnel syndrome, bilateral   . Chronic pain syndrome   . COPD (chronic obstructive pulmonary disease) ([place])   . Depression   . Diverticulitis of colon   . Esophageal reflux   . H/O acute prostatitis   . Headache(784.0)    hx of migraines  . Hypothyroidism   . Insomnia, unspecified   . Lumbar degenerative disc disease   . Mixed hyperlipidemia   . Peyronie disease 12-20-12   PER uROLOGY  . Seasonal allergies   . Sleep apnea    uses cpap  . Tympanic membrane perforation    history of  . Unspecified essential hypertension     Past Surgical History:  Procedure Laterality Date  . LumbarSacral Disc Surgery     L5-S1  . MENISCUS REPAIR Left    torn meniscus  . NESBIT PROCEDURE N/A 08/22/2013   Procedure: 16 DOT PLACTATION;  Surgeon: Claybon Jabs, MD;  Location: [facility];  Service: Urology;  Laterality: N/A;  . TYMPANOSTOMY TUBE PLACEMENT  1975  . VASECTOMY Bilateral 08/22/2013   Procedure: VASECTOMY;  Surgeon: Claybon Jabs, MD;  Location: [facility];  Service: Urology;  Laterality: Bilateral;    Family History  Problem Relation Age of Onset  . Bladder Cancer Paternal Uncle   . Renal cancer Paternal Uncle     renal cell carcinoma  . Congestive Heart Failure Father   . Heart disease Father   . COPD Father   . Renal cancer Paternal Grandfather     renal call carcinoma  . Heart disease Brother     stents at 35 yo  . Parkinsonism Neg Hx     Social History   Social History  . Marital status:  Married    Spouse name: Corey Bell  . Number of children: 0  . Years of education: college   Occupational History  . disabled Nurse, mental health  .  Unemployed   Social History Main Topics  . Smoking status: Former Smoker    Packs/day: 1.50    Years: 30.00    Types: Cigarettes  Quit date: 10/10/2007  . Smokeless tobacco: Never Used  . Alcohol use 0.6 oz/week    1 Cans of beer per week     Comment: rarely  . Drug use: No  . Sexual activity: Not on file   Other Topics Concern  . Not on file   Social History Narrative   Married.  Corey Bell)  No children. Patient has Best boy.   In process of getting disability for low back pain.   Right handed.   Caffeine- Coffee a pot but not every day.   The PMH, PSH, Social History, Family History, Medications, and allergies have been reviewed in [facility], and have been updated if relevant.    Review of Systems  Constitutional: Positive for fatigue.  HENT: Negative.   Eyes: Negative.   Respiratory: Negative.   Cardiovascular: Negative.   Gastrointestinal: Negative.   Genitourinary: Negative.   Musculoskeletal: Negative.   Skin: Positive for color change.  Hematological: Negative.   Psychiatric/Behavioral: Negative.   All other systems reviewed and are negative.      Objective:    BP 128/72 (BP Location: Left Arm, Patient Position: Sitting, Cuff Size: Large)   Pulse 65   Temp 98 F (36.7 C) (Oral)   Wt 293 lb (132.9 kg)   SpO2 96%   BMI 37.62 kg/m   Wt Readings from Last 3 Encounters:  01/25/17 293 lb (132.9 kg)  01/04/17 298 lb (135.2 kg)  12/26/16 295 lb (133.8 kg)    Physical Exam  Constitutional: He is oriented to person, place, and time. He appears well-developed and well-nourished. No distress.  HENT:  Head: Normocephalic and atraumatic.  Eyes: Conjunctivae are normal.  Neck: Normal range of motion.  Cardiovascular: Normal rate.   Pulmonary/Chest: Effort normal.  Abdominal: Soft.  Musculoskeletal: He exhibits  no edema.  Neurological: He is alert and oriented to person, place, and time. No cranial nerve deficit.  Skin: Skin is warm and dry.     Psychiatric: He has a normal mood and affect. His behavior is normal. Judgment and thought content normal.  Nursing note and vitals reviewed.         Assessment & Plan:   Essential hypertension  Diabetes mellitus, new onset ([place]) No Follow-up on file.

## 2017-01-25 NOTE — Progress Notes (Signed)
Subjective:   Patient ID: Corey Bell, male    DOB: 1965/12/24, 51 y.o.   MRN: [id number]  Corey Bell is a pleasant 51 y.o. year old male who presents to clinic today with Follow-up (Diabetes. Average blood sugar at home low 100s fasting)  on 01/25/2017  HPI: DM- diagnosed in August 2015.    Lab Results  Component Value Date   HGBA1C 6.5 01/18/2017    No Family h/o DM.  Has been well controlled.  Brings his log.  Checking it twice daily.  Fasting FBS usually around 100.  Denies any symptoms or episodes of hypoglycemia. Started on Metformin 500 mg daily on 06/03/14.   Has been intolerant to multiple statins but he is on zetia- LDL at goal for diabetic.  Has been working on his weight.  Cutting back on snacking. Started the process for bariatric surgery- has appt scheduled with their nutritionist and psychologist scheduled.    Hypothyroidism- taking synthroid 75 mcg daily. Denies any symptoms of hypo or hyperthyroidism other than persistent fatigue.  CPAP does seem to help.   Lab Results  Component Value Date   TSH 1.56 01/18/2017    Current Outpatient Prescriptions on File Prior to Visit  Medication Sig Dispense Refill  . aspirin 325 MG tablet Take 325 mg by mouth daily. 2 TABS ONCE PO    . buPROPion (WELLBUTRIN XL) 300 MG 24 hr tablet Take 1 tablet (300 mg total) by mouth daily. 30 tablet 6  . cholecalciferol (VITAMIN D) 1000 units tablet Take 1,000 Units by mouth daily.    . citalopram (CELEXA) 40 MG tablet Take 1 tablet (40 mg total) by mouth daily. 30 tablet 6  . cyclobenzaprine (FLEXERIL) 5 MG tablet Take 1 tablet (5 mg total) by mouth 3 (three) times daily as needed for muscle spasms. 30 tablet 1  . erythromycin with ethanol (THERAMYCIN) 2 % external solution APPLY TWICE A DAY 60 mL 2  . ezetimibe (ZETIA) 10 MG tablet Take 1 tablet (10 mg total) by mouth daily. 30 tablet 6  . fexofenadine (ALLEGRA) 180 MG tablet Take 180 mg by mouth daily.    Marland Kitchen FREESTYLE  LITE test strip USE AS DIRECTED 50 each 5  . gabapentin (NEURONTIN) 800 MG tablet Take 800 mg by mouth 4 (four) times daily.     Marland Kitchen glucose monitoring kit (FREESTYLE) monitoring kit 1 each by Does not apply route as needed for other. Use as directed 1 each 0  . ibuprofen (ADVIL,MOTRIN) 600 MG tablet Take 600 mg by mouth as needed.     Marland Kitchen levothyroxine (SYNTHROID, LEVOTHROID) 75 MCG tablet Take 1 tablet (75 mcg total) by mouth daily. 30 tablet 6  . lisinopril (PRINIVIL,ZESTRIL) 10 MG tablet Take 1 tablet (10 mg total) by mouth daily. 30 tablet 6  . metFORMIN (GLUCOPHAGE) 500 MG tablet TAKE 1 TABLET BY MOUTH EVERY DAY WITH BREAKFAST 30 tablet 6  . methocarbamol (ROBAXIN) 500 MG tablet Take 1,000 mg by mouth every 6 (six) hours as needed for muscle spasms.     . metoprolol succinate (TOPROL-XL) 50 MG 24 hr tablet TAKE 1 TABLET BY MOUTH EVERY DAY. 90 tablet 1  . montelukast (SINGULAIR) 10 MG tablet TAKE ONE TABLET BY MOUTH EVERY NIGHT AT BEDTIME 90 tablet 1  . ONE TOUCH LANCETS MISC Check blood sugar once daily and as directed. Dx. E11.9 200 each 1  . oxyCODONE-acetaminophen (PERCOCET/ROXICET) 5-325 MG tablet Take 1 tablet by mouth every 6 (six) hours as  needed for severe pain. 30 tablet 0  . ranitidine (ZANTAC) 150 MG tablet Take 150 mg by mouth 2 (two) times daily.    . traMADol (ULTRAM) 50 MG tablet TAKE 1 TABLET BY MOUTH EVERY 8 HOURS AS NEEDED FOR MODERATE TO SEVEREPAIN 30 tablet 0  . vitamin B-12 (CYANOCOBALAMIN) 1000 MCG tablet Take 1,000 mcg by mouth daily.     No current facility-administered medications on file prior to visit.     Allergies  Allergen Reactions  . Augmentin [Amoxicillin-Pot Clavulanate] Diarrhea  . Demerol [Meperidine]     "goes crazy and has the strength of 10 men"  . Eggs Or Egg-Derived Products Diarrhea and Nausea And Vomiting  . Sulfamethoxazole Other (See Comments)    Blisters form in mouth  . Statins Rash  . Sulfa Antibiotics Rash  . Sulfacetamide Sodium Rash     Past Medical History:  Diagnosis Date  . Anxiety   . Aphthous ulcer 10-04-12   improving  . Carpal tunnel syndrome, bilateral   . Chronic pain syndrome   . COPD (chronic obstructive pulmonary disease) ([place])   . Depression   . Diverticulitis of colon   . Esophageal reflux   . H/O acute prostatitis   . Headache(784.0)    hx of migraines  . Hypothyroidism   . Insomnia, unspecified   . Lumbar degenerative disc disease   . Mixed hyperlipidemia   . Peyronie disease 12-20-12   PER uROLOGY  . Seasonal allergies   . Sleep apnea    uses cpap  . Tympanic membrane perforation    history of  . Unspecified essential hypertension     Past Surgical History:  Procedure Laterality Date  . LumbarSacral Disc Surgery     L5-S1  . MENISCUS REPAIR Left    torn meniscus  . NESBIT PROCEDURE N/A 08/22/2013   Procedure: 16 DOT PLACTATION;  Surgeon: Claybon Jabs, MD;  Location: [facility];  Service: Urology;  Laterality: N/A;  . TYMPANOSTOMY TUBE PLACEMENT  1975  . VASECTOMY Bilateral 08/22/2013   Procedure: VASECTOMY;  Surgeon: Claybon Jabs, MD;  Location: [facility];  Service: Urology;  Laterality: Bilateral;    Family History  Problem Relation Age of Onset  . Bladder Cancer Paternal Uncle   . Renal cancer Paternal Uncle     renal cell carcinoma  . Congestive Heart Failure Father   . Heart disease Father   . COPD Father   . Renal cancer Paternal Grandfather     renal call carcinoma  . Heart disease Brother     stents at 29 yo  . Parkinsonism Neg Hx     Social History   Social History  . Marital status: Married    Spouse name: [name]  . Number of children: 0  . Years of education: college   Occupational History  . disabled Nurse, mental health  .  Unemployed   Social History Main Topics  . Smoking status: Former Smoker    Packs/day: 1.50    Years: 30.00    Types: Cigarettes    Quit date: 10/10/2007  . Smokeless tobacco: Never Used  .  Alcohol use 0.6 oz/week    1 Cans of beer per week     Comment: rarely  . Drug use: No  . Sexual activity: Not on file   Other Topics Concern  . Not on file   Social History Narrative   Married.  [name])  No children. Patient has Best boy.  In process of getting disability for low back pain.   Right handed.   Caffeine- Coffee a pot but not every day.   The PMH, PSH, Social History, Family History, Medications, and allergies have been reviewed in [facility], and have been updated if relevant.   Review of Systems  Constitutional: Negative.   HENT: Negative.   Respiratory: Negative.   Cardiovascular: Negative.   Gastrointestinal: Negative.   Endocrine: Negative.   Genitourinary: Negative.   Musculoskeletal: Negative.   Allergic/Immunologic: Negative.   Neurological: Negative.   Hematological: Negative.   Psychiatric/Behavioral: Negative.   All other systems reviewed and are negative.      Objective:    BP 128/72 (BP Location: Left Arm, Patient Position: Sitting, Cuff Size: Large)   Pulse 65   Temp 98 F (36.7 C) (Oral)   Wt 293 lb (132.9 kg)   SpO2 96%   BMI 37.62 kg/m    Physical Exam General:  pleasant male in no acute distress Eyes:  PERRL Ears:  External ear exam shows no significant lesions or deformities.  TMs normal bilaterally Hearing is grossly normal bilaterally. Nose:  External nasal examination shows no deformity or inflammation. Nasal mucosa are pink and moist without lesions or exudates. Mouth:  Oral mucosa and oropharynx without lesions or exudates.  Teeth in good repair. Neck:  no carotid bruit or thyromegaly no cervical or supraclavicular lymphadenopathy  Lungs:  Normal respiratory effort, chest expands symmetrically. Lungs are clear to auscultation, no crackles or wheezes. Heart:  Normal rate and regular rhythm. S1 and S2 normal without gallop, murmur, click, rub or other extra sounds. Abdomen:  Bowel sounds positive,abdomen soft and  non-tender without masses, organomegaly or hernias noted. Pulses:  R and L posterior tibial pulses are full and equal bilaterally  Extremities:  no edema  Psych:  Good eye contact, not anxious or depressed appearing        Assessment & Plan:   Essential hypertension - Plan: Comprehensive metabolic panel  Diabetes mellitus, new onset ([place]) - Plan: Lipid panel No Follow-up on file.

## 2017-01-25 NOTE — Assessment & Plan Note (Signed)
a1c deteriorated but still at goal and FSBS log is excellent. No changes made to rxs today. Continue process for approval of bariatric surgery. The patient indicates understanding of these issues and agrees with the plan.

## 2017-01-25 NOTE — Patient Instructions (Signed)
Great to see you.  Please follow up with me in 6 months.

## 2017-01-25 NOTE — Progress Notes (Signed)
Pre visit review using our clinic review tool, if applicable. No additional management support is needed unless otherwise documented below in the visit note. 

## 2017-01-25 NOTE — Assessment & Plan Note (Signed)
Euthyroid on current rx. No changes made.

## 2017-01-25 NOTE — Assessment & Plan Note (Signed)
Continue zetia. Has been at goal for a diabetic.

## 2017-01-31 ENCOUNTER — Encounter: Payer: Self-pay | Admitting: Family Medicine

## 2017-01-31 ENCOUNTER — Ambulatory Visit: Payer: Self-pay | Admitting: Registered"

## 2017-01-31 DIAGNOSIS — Z1211 Encounter for screening for malignant neoplasm of colon: Secondary | ICD-10-CM | POA: Diagnosis not present

## 2017-01-31 DIAGNOSIS — Z1212 Encounter for screening for malignant neoplasm of rectum: Secondary | ICD-10-CM | POA: Diagnosis not present

## 2017-02-02 ENCOUNTER — Encounter: Payer: PPO | Attending: Surgery | Admitting: Registered"

## 2017-02-02 ENCOUNTER — Encounter: Payer: Self-pay | Admitting: Registered"

## 2017-02-02 DIAGNOSIS — Z713 Dietary counseling and surveillance: Secondary | ICD-10-CM | POA: Insufficient documentation

## 2017-02-02 DIAGNOSIS — E669 Obesity, unspecified: Secondary | ICD-10-CM

## 2017-02-02 NOTE — Patient Instructions (Addendum)
-   Add protein source with breakfast: peanut butter & toast, greek yogurt, protein shake  - Increase physical activity with arm exercises to 15 min/day   - Track blood sugars throughout day and monitor for low blood sugar numbers at 70 or below

## 2017-02-02 NOTE — Progress Notes (Signed)
Pre-Op Assessment Visit:  Pre-Operative Sleeve Gastrectomy or RYGB Surgery  Medical Nutrition Therapy:  Appt start time: 10:05  End time:  11:20  Patient was seen on 02/02/2017 for Pre-Operative Nutrition Assessment. Assessment and letter of approval faxed to [facility] Bariatric Surgery Program coordinator on 02/02/2017.   Pt expectation of surgery: to lose weight in stomach area to be able to wear belt buckle in front, quit taking a lot of medication, get rid of sleep apnea machine  Pt expectation of Dietitian: to be prepared for pre/post surgery  Start weight at N[facility]: 289.9 BMI: 37.73  Food allergy: eggs  Pt states he is unsure of which surgery procedure he wants, either sleeve gastrectomy or RYGB. Pt states he's hoping to get enough education to not need surgery and be able to lose weight on his own. Pt states he checks his blood sugar once or twice a day and his fasting numbers are usually 112-120. Pt reports his last A1c 6.5 about 2 wks ago. Pt reports that he sometimes experiences low blood sugar values of 60 around 11am, especially when he's doing things around his house.   Pt states he had back surgery years ago and has limited ability as a result. Pt states he really likes peanuts.   Pt states that he will contact [facility] to verify amount of nutrition visits required prior to surgery and will contact us.     24 hr Dietary Recall: First Meal: banana, yogurt Snack: apples, peanut butter crackers, banana Second Meal: can of beans, can of chili Snack: sometimes, apple Third Meal: pasta, chicken nuggets, french fries, pizza, salad, vinaigrette dressing Snack: sometimes, 2-3 pcs of sugarless candy Beverages: water, diet soda, unsweeteend tea w/ splenda, black coffee  Encouraged to engage in 150 minutes of moderate physical activity including cardiovascular and weight baring weekly  Handouts given during visit include:  . Pre-Op Goals . Bariatric Surgery Protein  Shakes . Vitamin and Mineral Supplements . Diabetes Plate . Arm chair exercises  During the appointment today the following Pre-Op Goals were reviewed with the patient: . Maintain or lose weight as instructed by your surgeon . Make healthy food choices . Begin to limit portion sizes . Limited concentrated sugars and fried foods . Keep fat/sugar in the single digits per serving on          food labels . Practice CHEWING your food  (aim for 30 chews per bite or until applesauce consistency) . Practice not drinking 15 minutes before, during, and 30 minutes after each meal/snack . Avoid all carbonated beverages  . Avoid/limit caffeinated beverages  . Avoid all sugar-sweetened beverages . Consume 3 meals per day; eat every 3-5 hours . Make a list of non-food related activities . Aim for 64-100 ounces of FLUID daily  . Aim for at least 60-80 grams of PROTEIN daily . Look for a liquid protein source that contain ?15 g protein and ?5 g carbohydrate  (ex: shakes, drinks, shots)  Goals: - Add protein source with breakfast: peanut butter & toast, greek yogurt, protein shake - Increase physical activity with arm exercises to 15 min/day  - Track blood sugars throughout day and monitor for low blood sugar numbers at 70 or below  Follow diet recommendations listed below  Energy and Macronutrient Recomendations: Calories: 1800 Carbohydrate: 200 Protein: 135 Fat: 50  Demonstrated degree of understanding via:  Teach Back  Teaching Method Utilized:  Visual Auditory Hands on  Barriers to learning/adherence to lifestyle change: none  Patient to call the Nutrition and Diabetes Education Services to enroll in Pre-Op and Post-Op Nutrition Education when surgery date is scheduled.

## 2017-02-08 LAB — COLOGUARD: Cologuard: NEGATIVE

## 2017-02-15 ENCOUNTER — Encounter: Payer: Self-pay | Admitting: Registered"

## 2017-02-15 ENCOUNTER — Encounter: Payer: PPO | Attending: Surgery | Admitting: Registered"

## 2017-02-15 DIAGNOSIS — Z713 Dietary counseling and surveillance: Secondary | ICD-10-CM | POA: Insufficient documentation

## 2017-02-15 DIAGNOSIS — E669 Obesity, unspecified: Secondary | ICD-10-CM

## 2017-02-15 NOTE — Patient Instructions (Addendum)
-   Steam vegetables in microwave to increase vegetable intake  - Find a liquid protein source that you enjoy with 15g or greater of protein and 5g or less of carbohydrates per serving.   - Continue chewing at least 30 times per bite or to applesauce consistency.

## 2017-02-15 NOTE — Progress Notes (Signed)
Appt start time: 7:55 end time: 8:15.  Assessment: 1st SWL Appointment.   Start Wt at NDES: 289.9 Wt: 293.9 BMI: 38.25  Food allergy: eggs  Pt arrives having gained 4 lbs from previous visit. Pt states his wife is currently out of town and he's been the "eating like a bachelor". Pt states he no longer experiences low blood sugars mid-morning since adding a protein source with carbohydrate during breakfast. Pt states he has increased his physical activity with arm exercises and can tolerate them without back pain. Pt is disappointed about weight gain and states he likes food. Pt was encouraged to include non-starchy vegetables with meals.    Per assessment appointment, pt states he is unsure of which surgery procedure he wants, either sleeve gastrectomy or RYGB. Pt states he's hoping to get enough education to not need surgery and be able to lose weight on his own. Pt needs 6 SWL visits with [place] prior to surgery.   Goals:  - Steam vegetables in microwave to increase vegetable intake - Find a liquid protein source that you enjoy with 15g or greater of protein and 5g or less of carbohydrates per serving.  - Continue chewing at least 30 times per bite or to applesauce consistency.   Preferred Learning Style:   Auditory  Visual  Hands on  No preference indicated   Learning Readiness:   Ready  Change in progress  MEDICATIONS: See list   DIETARY INTAKE:  24-hr recall:  B ( AM): banana, peanut butter toast, greek yogurt Snk ( AM): peanut butter crackers (sometimes)  L ( PM): chili  Snk ( PM): apple D ( PM): chili Snk ( PM): none Beverages: coffee, water, unsweetened tea (w/ splenda)  Usual physical activity: 15-20 min of arm exercises  Diet to Follow: 1800 calories 200 g carbohydrates 135 g protein 50 g fat   Nutritional Diagnosis:  [place]-3.3 Overweight/obesity related to past poor dietary habits and physical inactivity as evidenced by patient w/ upcoming RYGB/sleeve  gastrectomy surgery following dietary guidelines for continued weight loss.    Intervention:  Nutrition counseling for upcoming Bariatric Surgery.  Teaching Method Utilized:  Visual Auditory Hands on  Handouts given during visit include:  Protein Shakes  Barriers to learning/adherence to lifestyle change: none  Demonstrated degree of understanding via:  Teach Back   Monitoring/Evaluation:  Dietary intake, exercise, and body weight in 1 month(s).

## 2017-02-22 ENCOUNTER — Telehealth: Payer: Self-pay | Admitting: Family Medicine

## 2017-02-22 NOTE — Telephone Encounter (Signed)
See results scanned into chart-it was negative.  Please apologize to patient that he was not informed.  Repeat in 3 years.

## 2017-02-22 NOTE — Telephone Encounter (Signed)
Patient's calling to get his Cologuard results.  Patient said he sent it in a month ago.

## 2017-02-23 NOTE — Telephone Encounter (Signed)
Pt was notified of results and instructions, pt verbalized understanding.   

## 2017-02-28 ENCOUNTER — Encounter: Payer: Self-pay | Admitting: Neurology

## 2017-02-28 ENCOUNTER — Ambulatory Visit (INDEPENDENT_AMBULATORY_CARE_PROVIDER_SITE_OTHER): Payer: PPO | Admitting: Neurology

## 2017-02-28 VITALS — BP 126/78 | HR 60 | Ht 74.0 in | Wt 298.0 lb

## 2017-02-28 DIAGNOSIS — R419 Unspecified symptoms and signs involving cognitive functions and awareness: Secondary | ICD-10-CM | POA: Diagnosis not present

## 2017-02-28 DIAGNOSIS — R251 Tremor, unspecified: Secondary | ICD-10-CM | POA: Diagnosis not present
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
## 2017-02-28 NOTE — Patient Instructions (Signed)
Remember to drink plenty of fluid, eat healthy meals and do not skip any meals. Try to eat protein with a every meal and eat a healthy snack such as fruit or nuts in between meals. Try to keep a regular sleep-wake schedule and try to exercise daily, particularly in the form of walking, 20-30 minutes a day, if you can.   [facility] [phone]  I would like to see you back as needed, sooner if we need to. Please call us with any interim questions, concerns, problems, updates or refill requests.    Our phone number is [phone]. We also have an after hours call service for urgent matters and there is a physician on-call for urgent questions. For any emergencies you know to call 911 or go to the nearest emergency room

## 2017-03-16 DIAGNOSIS — L738 Other specified follicular disorders: Secondary | ICD-10-CM | POA: Diagnosis not present

## 2017-03-16 DIAGNOSIS — L301 Dyshidrosis [pompholyx]: Secondary | ICD-10-CM | POA: Diagnosis not present

## 2017-03-17 DIAGNOSIS — S0502XA Injury of conjunctiva and corneal abrasion without foreign body, left eye, initial encounter: Secondary | ICD-10-CM | POA: Diagnosis not present

## 2017-03-21 ENCOUNTER — Encounter: Payer: PPO | Attending: Surgery | Admitting: Registered"

## 2017-03-21 DIAGNOSIS — Z713 Dietary counseling and surveillance: Secondary | ICD-10-CM | POA: Diagnosis not present

## 2017-03-21 DIAGNOSIS — E119 Type 2 diabetes mellitus without complications: Secondary | ICD-10-CM

## 2017-03-21 NOTE — Progress Notes (Signed)
Appt start time: 8:00 end time: 8:20  Assessment: 2nd SWL Appointment.   Start Wt at NDES: 289.9 Wt: 296.2 BMI: 38.03  Food allergy: eggs  Pt arrives having gained 2.3 lbs from previous visit. Pt states he has been spending a lot of time building and preparing his garage for '[employer] that will arrive next Sunday. Pt states he has been eating frozen steamed vegetables. Pt states he checks his BS once a day: FBS (110-119) unless he experiences hypoglycemic signs and symptoms. Pt states he had a low BS (68) last week when he ate some twizzlers as mid morning snack followed by lunch of roast and mashed potatoes. Last months goal was to find a liquid protein source that you enjoy with 15g or greater of protein and 5g or less of carbohydrates per serving. Pt states he wants to wait until closer to surgery date around month 4.    Per assessment appointment, pt states he is unsure of which surgery procedure he wants, either sleeve gastrectomy or RYGB. Pt states he's hoping to get enough education to not need surgery and be able to lose weight on his own. Pt needs 6 SWL visits with [place] prior to surgery.   Goals:  - Check BS sugars at least twice a day: fasting and 2 hrs after lunch or dinner. Track numbers.  - Aim for arm exercises 15-20 min/day 5 times a week.   Preferred Learning Style:   Auditory  Visual  Hands on  No preference indicated   Learning Readiness:   Ready  Change in progress  MEDICATIONS: See list   DIETARY INTAKE:  24-hr recall:  B ( AM): banana, peanut butter toast, greek yogurt Snk ( AM): peanut butter crackers (sometimes)  L ( PM): chili  Snk ( PM): apple D ( PM): chili Snk ( PM): none Beverages: coffee, water, unsweetened tea (w/ splenda)  Usual physical activity: 15-20 min of arm exercises 3-4 times a week  Diet to Follow: 1800 calories 200 g carbohydrates 135 g protein 50 g fat   Nutritional Diagnosis:  [place]-3.3 Overweight/obesity related to past  poor dietary habits and physical inactivity as evidenced by patient w/ upcoming RYGB/sleeve gastrectomy surgery following dietary guidelines for continued weight loss.    Intervention:  Nutrition counseling for upcoming Bariatric Surgery.  Teaching Method Utilized:  Visual Auditory Hands on  Handouts given during visit include:  none  Barriers to learning/adherence to lifestyle change: none  Demonstrated degree of understanding via:  Teach Back   Monitoring/Evaluation:  Dietary intake, exercise, and body weight in 1 month(s).

## 2017-03-21 NOTE — Patient Instructions (Addendum)
-   Check BS sugars at least twice a day: fasting and 2 hrs after lunch or dinner. Track numbers.   - Aim for arm exercises 15-20 min/day 5 times a week.

## 2017-03-22 ENCOUNTER — Other Ambulatory Visit: Payer: Self-pay | Admitting: Family Medicine

## 2017-03-22 NOTE — Telephone Encounter (Signed)
Last refill 01/04/17 Last OV 01/25/17 Ok to refill?

## 2017-03-22 NOTE — Telephone Encounter (Signed)
Faxed to  [facility], [place] [facility] [address]  [place] [address]  Phone: [phone] Fax: [phone]

## 2017-04-10 ENCOUNTER — Encounter: Payer: PPO | Attending: Surgery | Admitting: Registered"

## 2017-04-10 ENCOUNTER — Encounter: Payer: Self-pay | Admitting: Registered"

## 2017-04-10 DIAGNOSIS — Z713 Dietary counseling and surveillance: Secondary | ICD-10-CM | POA: Insufficient documentation

## 2017-04-10 DIAGNOSIS — E119 Type 2 diabetes mellitus without complications: Secondary | ICD-10-CM

## 2017-04-10 NOTE — Progress Notes (Signed)
Appt start time: 8:30 end time: 8:55  Assessment: 3rd SWL Appointment.   Start Wt at NDES: 289.9 Wt: 293.7 BMI: 37.71  Food allergy: eggs  Pt arrives having lost 2.5 lbs from previous visit. Pt states he is "more interested in sleeve gastrectomy at this point because it is less invasive". Pt reports he is working on trying to not eat when full. Pt reports checking BS 1-2x day: FBS (107-109). Pt states his physical activity has increased lately because he is in the process of priming and sanding cabinets in home kitchen; about  3 hrs/day 3-4 times/week. Pt states he has heard that spicy food has no adverse affects. Pt states he drinks half and half coffee in the morning.   Per assessment appointment, pt states he is unsure of which surgery procedure he wants, either sleeve gastrectomy or RYGB. Pt states he's hoping to get enough education to not need surgery and be able to lose weight on his own. Pt needs 6 SWL visits with [place] prior to surgery.    Goals:  - Increase non-starchy vegetable intake during your day.  - Aim to chew food thoroughly, at least 30 times per bite or to applesauce consistency.   Preferred Learning Style:   Auditory  Visual  Hands on  No preference indicated   Learning Readiness:   Ready  Change in progress  MEDICATIONS: See list   DIETARY INTAKE:  24-hr recall:  B ( AM): banana, peanut butter toast, greek yogurt Snk ( AM): peanut butter crackers (sometimes)  L ( PM): hot dogs Snk ( PM): apple D ( PM): grilled pork chops, grilled vegetables or salad with homemade dressing Snk ( PM): popcorn or 2-3 cookies  Beverages: 6 cups coffee, water, unsweetened tea (w/ splenda)  Usual physical activity: 15-20 min of arm exercises 3-4 times a week  Diet to Follow: 1800 calories 200 g carbohydrates 135 g protein 50 g fat   Nutritional Diagnosis:  -3.3 Overweight/obesity related to past poor dietary habits and physical inactivity as evidenced by patient  w/ upcoming RYGB/sleeve gastrectomy surgery following dietary guidelines for continued weight loss.    Intervention:  Nutrition counseling for upcoming Bariatric Surgery.  Teaching Method Utilized:  Visual Auditory Hands on  Handouts given during visit include:  none  Barriers to learning/adherence to lifestyle change: none  Demonstrated degree of understanding via:  Teach Back   Monitoring/Evaluation:  Dietary intake, exercise, and body weight in 1 month(s).

## 2017-04-10 NOTE — Patient Instructions (Addendum)
-   Increase non-starchy vegetable intake during your day.   - Aim to chew food thoroughly, at least 30 times per bite or to applesauce consistency.

## 2017-04-12 ENCOUNTER — Other Ambulatory Visit: Payer: Self-pay | Admitting: Family Medicine

## 2017-04-26 ENCOUNTER — Ambulatory Visit (HOSPITAL_COMMUNITY)
Admission: RE | Admit: 2017-04-26 | Discharge: 2017-04-26 | Disposition: A | Discharge status: Home / Self Care | Payer: PPO | Source: Ambulatory Visit | Attending: Surgery | Admitting: Surgery

## 2017-04-26 DIAGNOSIS — K8020 Calculus of gallbladder without cholecystitis without obstruction: Secondary | ICD-10-CM | POA: Diagnosis not present

## 2017-04-26 DIAGNOSIS — K224 Dyskinesia of esophagus: Secondary | ICD-10-CM | POA: Diagnosis not present

## 2017-05-09 ENCOUNTER — Ambulatory Visit: Payer: Self-pay | Admitting: Registered"

## 2017-05-11 ENCOUNTER — Encounter: Payer: PPO | Attending: Surgery | Admitting: Registered"

## 2017-05-11 ENCOUNTER — Encounter: Payer: Self-pay | Admitting: Registered"

## 2017-05-11 DIAGNOSIS — Z713 Dietary counseling and surveillance: Secondary | ICD-10-CM | POA: Insufficient documentation

## 2017-05-11 DIAGNOSIS — E119 Type 2 diabetes mellitus without complications: Secondary | ICD-10-CM

## 2017-05-11 NOTE — Progress Notes (Signed)
Appt start time: 10:05 end time: 10:35  Assessment: 4th SWL Appointment.   Start Wt at NDES: 289.9 Wt: 289.7 BMI: 37.20  Food allergy: eggs  Pt arrives having lost 4 lbs from previous visit. Pt states chewing is going well.    Pt states he is "more interested in sleeve gastrectomy at this point because it is less invasive". Pt reports checking BS 1-2x day: FBS (105). Pt states his physical activity has increased lately because he is in the process of priming and sanding cabinets in home kitchen; about  3 hrs/day 3-4 times/week. Pt states he has heard that spicy food has no adverse affects. Pt states he drinks half and half coffee in the morning.   Pt states he's hoping to get enough education to not need surgery and be able to lose weight on his own. Pt needs 6 SWL visits with [place] prior to surgery. Pt states remaining SWL visits have been scheduled.   Preferred Learning Style:   No preference indicated   Learning Readiness:   Ready  Change in progress  MEDICATIONS: See list   DIETARY INTAKE:  24-hr recall:  B ( AM): banana, peanut butter toast, greek yogurt Snk ( AM): peanut butter crackers (sometimes)  L ( PM): hot dogs Snk ( PM): apple D ( PM): grilled pork chops, grilled vegetables or salad with homemade dressing Snk ( PM): low-fat yogurt, glass of milk  Beverages: 6 cups coffee, water, unsweetened tea (w/ splenda)  Usual physical activity: 15-20 min of arm exercises 3-4 times a week  Diet to Follow: 1800 calories 200 g carbohydrates 135 g protein 50 g fat   Nutritional Diagnosis:  [place]-3.3 Overweight/obesity related to past poor dietary habits and physical inactivity as evidenced by patient w/ upcoming RYGB/sleeve gastrectomy surgery following dietary guidelines for continued weight loss.    Intervention:  Nutrition counseling for upcoming Bariatric Surgery. Goals: - Aim to not drink 15 min before eating, not while eating, and waiting 30 min after eating. -  Replace bedtime snack with greek yogurt containing fat/sugar grams within single digits. - Find multiple flavors of protein shakes/drinks that you enjoy containing at least 15g of protein or more and 5g of carbohydrates or less.   Teaching Method Utilized:  Visual Auditory Hands on  Handouts given during visit include:  none  Barriers to learning/adherence to lifestyle change: none  Demonstrated degree of understanding via:  Teach Back   Monitoring/Evaluation:  Dietary intake, exercise, and body weight in 1 month(s).

## 2017-05-11 NOTE — Patient Instructions (Addendum)
-   Aim to not drink 15 min before eating, not while eating, and waiting 30 min after eating.  - Replace bedtime snack with greek yogurt containing fat/sugar grams within single digits.  - Find multiple flavors of protein shakes/drinks that you enjoy containing at least 15g of protein or more and 5g of carbohydrates or less.

## 2017-05-21 ENCOUNTER — Other Ambulatory Visit: Payer: Self-pay | Admitting: Family Medicine

## 2017-05-23 NOTE — Telephone Encounter (Signed)
Last filled 03-22-17 #30 Last OV 01-25-17 Next OV 07-30-17

## 2017-05-24 NOTE — Telephone Encounter (Signed)
Verbal refill given to Brie at pharmacy

## 2017-05-30 DIAGNOSIS — B078 Other viral warts: Secondary | ICD-10-CM | POA: Diagnosis not present

## 2017-05-30 DIAGNOSIS — L301 Dyshidrosis [pompholyx]: Secondary | ICD-10-CM | POA: Diagnosis not present

## 2017-06-05 ENCOUNTER — Telehealth: Payer: Self-pay

## 2017-06-05 MED ORDER — GLUCOSE BLOOD VI STRP: ORAL_STRIP | 5 refills | Status: DC

## 2017-06-05 NOTE — Telephone Encounter (Signed)
Corey Bell at [facility] wants clarification on instructions for test strips; ins will not allow use as directed. I spoke with pt and he is testing one - two times daily. Pharmacist at [employer] said put bid for ins coverage.

## 2017-06-06 ENCOUNTER — Ambulatory Visit (INDEPENDENT_AMBULATORY_CARE_PROVIDER_SITE_OTHER): Payer: PPO | Admitting: Family Medicine

## 2017-06-06 ENCOUNTER — Encounter: Payer: Self-pay | Admitting: Family Medicine

## 2017-06-06 VITALS — BP 124/84 | Ht 74.0 in | Wt 294.0 lb

## 2017-06-06 DIAGNOSIS — E119 Type 2 diabetes mellitus without complications: Secondary | ICD-10-CM | POA: Diagnosis not present

## 2017-06-06 MED ORDER — CYCLOBENZAPRINE HCL 10 MG PO TABS: 10.0000 mg | ORAL_TABLET | Freq: Every day | ORAL | 3 refills | Status: DC

## 2017-06-06 MED ORDER — METHOCARBAMOL 500 MG PO TABS: 1000.0000 mg | ORAL_TABLET | Freq: Four times a day (QID) | ORAL | 3 refills | Status: DC | PRN

## 2017-06-06 MED ORDER — EZETIMIBE 10 MG PO TABS: 10.0000 mg | ORAL_TABLET | Freq: Every day | ORAL | 3 refills | Status: DC

## 2017-06-06 NOTE — Assessment & Plan Note (Signed)
Well controlled. Continue metformin- once daily with breakfast. Follow up in 6 months. The patient indicates understanding of these issues and agrees with the plan.

## 2017-06-06 NOTE — Progress Notes (Signed)
Subjective:   Patient ID: Corey Bell, male    DOB: 04/26/1966, 51 y.o.   MRN: [id number]  Corey Bell is a pleasant 51 y.o. year old male who presents to clinic today with Office Visit (Insurance Forms)  on 06/06/2017  HPI:  Initially here for me to fill out life insurance waiver forms which I explained to pt I cannot fill out- they are in regards to his back surgery so his neurosurgeon would need to complete them.  DM- has been under good control.  Brings in his fasting FBS log from home- ranging 96-120. Denies any episodes of hypoglycemia. Compliant with Metformin 500 mg daily.  In the process of getting bariatric surgery approved and hopes his diabetes will be diet controlled after surgery. Lab Results  Component Value Date   HGBA1C 6.5 01/18/2017      Current Outpatient Prescriptions on File Prior to Visit  Medication Sig Dispense Refill  . aspirin 325 MG tablet Take 325 mg by mouth daily. 2 TABS ONCE PO    . buPROPion (WELLBUTRIN XL) 300 MG 24 hr tablet Take 1 tablet (300 mg total) by mouth daily. 30 tablet 6  . cholecalciferol (VITAMIN D) 1000 units tablet Take 1,000 Units by mouth daily.    . citalopram (CELEXA) 40 MG tablet Take 1 tablet (40 mg total) by mouth daily. 30 tablet 6  . erythromycin with ethanol (THERAMYCIN) 2 % external solution APPLY TWICE A DAY 60 mL 2  . fexofenadine (ALLEGRA) 180 MG tablet Take 180 mg by mouth daily.    Marland Kitchen gabapentin (NEURONTIN) 800 MG tablet Take 800 mg by mouth 4 (four) times daily.     Marland Kitchen glucose blood (FREESTYLE LITE) test strip Ck blood sugar twice a day. Dx E11.9 100 each 5  . glucose monitoring kit (FREESTYLE) monitoring kit 1 each by Does not apply route as needed for other. Use as directed 1 each 0  . ibuprofen (ADVIL,MOTRIN) 600 MG tablet Take 600 mg by mouth as needed.     Marland Kitchen levothyroxine (SYNTHROID, LEVOTHROID) 75 MCG tablet Take 1 tablet (75 mcg total) by mouth daily. 30 tablet 6  . lisinopril (PRINIVIL,ZESTRIL) 10 MG  tablet Take 1 tablet (10 mg total) by mouth daily. 30 tablet 6  . metFORMIN (GLUCOPHAGE) 500 MG tablet TAKE 1 TABLET BY MOUTH EVERY DAY WITH BREAKFAST 30 tablet 6  . metoprolol succinate (TOPROL-XL) 50 MG 24 hr tablet TAKE 1 TABLET BY MOUTH EVERY DAY 90 tablet 3  . montelukast (SINGULAIR) 10 MG tablet TAKE ONE TABLET BY MOUTH EVERY NIGHT AT BEDTIME 90 tablet 3  . ONE TOUCH LANCETS MISC Check blood sugar once daily and as directed. Dx. E11.9 200 each 1  . oxyCODONE-acetaminophen (PERCOCET/ROXICET) 5-325 MG tablet Take 1 tablet by mouth every 6 (six) hours as needed for severe pain. 30 tablet 0  . ranitidine (ZANTAC) 150 MG tablet Take 150 mg by mouth 2 (two) times daily.    . traMADol (ULTRAM) 50 MG tablet TAKE 1 TABLET BY MOUTH EVERY 8 HOURS AS NEEDED FOR MODERATE TO SEVEREPAIN 30 tablet 0  . vitamin B-12 (CYANOCOBALAMIN) 1000 MCG tablet Take 1,000 mcg by mouth daily.     No current facility-administered medications on file prior to visit.     Allergies  Allergen Reactions  . Augmentin [Amoxicillin-Pot Clavulanate] Diarrhea  . Demerol [Meperidine]     "goes crazy and has the strength of 10 men"  . Eggs Or Egg-Derived Products Diarrhea and Nausea And  Vomiting  . Sulfamethoxazole Other (See Comments)    Blisters form in mouth  . Statins Rash  . Sulfa Antibiotics Rash  . Sulfacetamide Sodium Rash    Past Medical History:  Diagnosis Date  . Anxiety   . Aphthous ulcer 10-04-12   improving  . Carpal tunnel syndrome, bilateral   . Chronic pain syndrome   . COPD (chronic obstructive pulmonary disease) ([place])   . Depression   . Diabetes mellitus without complication ([place])   . Diverticulitis of colon   . Esophageal reflux   . H/O acute prostatitis   . Headache(784.0)    hx of migraines  . Hypothyroidism   . Insomnia, unspecified   . Lumbar degenerative disc disease   . Mixed hyperlipidemia   . Peyronie disease 12-20-12   PER uROLOGY  . Seasonal allergies   . Sleep apnea    uses  cpap  . Tympanic membrane perforation    history of  . Unspecified essential hypertension     Past Surgical History:  Procedure Laterality Date  . LumbarSacral Disc Surgery     L5-S1  . MENISCUS REPAIR Left    torn meniscus  . NESBIT PROCEDURE N/A 08/22/2013   Procedure: 16 DOT PLACTATION;  Surgeon: Claybon Jabs, MD;  Location: [facility];  Service: Urology;  Laterality: N/A;  . TYMPANOSTOMY TUBE PLACEMENT  1975  . VASECTOMY Bilateral 08/22/2013   Procedure: VASECTOMY;  Surgeon: Claybon Jabs, MD;  Location: [facility];  Service: Urology;  Laterality: Bilateral;    Family History  Problem Relation Age of Onset  . Congestive Heart Failure Father   . Heart disease Father   . COPD Father   . Renal cancer Paternal Grandfather        renal call carcinoma  . Heart disease Brother        stents at 14 yo  . Bladder Cancer Paternal Uncle   . Renal cancer Paternal Uncle        renal cell carcinoma  . Heart disease Other   . Parkinsonism Neg Hx     Social History   Social History  . Marital status: Married    Spouse name: [name]  . Number of children: 0  . Years of education: college   Occupational History  . disabled Nurse, mental health  .  Unemployed   Social History Main Topics  . Smoking status: Former Smoker    Packs/day: 1.50    Years: 30.00    Types: Cigarettes    Quit date: 10/10/2007  . Smokeless tobacco: Never Used  . Alcohol use 0.6 oz/week    1 Cans of beer per week     Comment: rarely  . Drug use: No  . Sexual activity: Not on file   Other Topics Concern  . Not on file   Social History Narrative   Married.  [name])  No children. Patient has Best boy.   In process of getting disability for low back pain.   Right handed.   Caffeine- Coffee a pot but not every day.   The PMH, PSH, Social History, Family History, Medications, and allergies have been reviewed in [facility], and have been updated if relevant.   Review of  Systems  Constitutional: Negative.   Endocrine: Negative.   Musculoskeletal: Negative.   Neurological: Negative.   Psychiatric/Behavioral: Negative.   All other systems reviewed and are negative.      Objective:    BP 124/84   Ht  6' 2"  (1.88 m)   Wt 294 lb (133.4 kg)   BMI 37.75 kg/m    Physical Exam  Constitutional: He is oriented to person, place, and time. He appears well-developed and well-nourished. No distress.  HENT:  Head: Normocephalic and atraumatic.  Eyes: Conjunctivae are normal.  Cardiovascular: Normal rate.   Pulmonary/Chest: Effort normal.  Musculoskeletal: Normal range of motion. He exhibits no edema.  Neurological: He is alert and oriented to person, place, and time. No cranial nerve deficit.  Skin: Skin is warm and dry. He is not diaphoretic.  Psychiatric: He has a normal mood and affect. His behavior is normal. Judgment and thought content normal.  Nursing note and vitals reviewed.         Assessment & Plan:   Diabetes mellitus, new onset ([place]) No Follow-up on file.

## 2017-06-13 ENCOUNTER — Encounter: Payer: PPO | Attending: Surgery | Admitting: Registered"

## 2017-06-13 ENCOUNTER — Encounter: Payer: Self-pay | Admitting: Registered"

## 2017-06-13 DIAGNOSIS — E119 Type 2 diabetes mellitus without complications: Secondary | ICD-10-CM

## 2017-06-13 DIAGNOSIS — Z713 Dietary counseling and surveillance: Secondary | ICD-10-CM | POA: Diagnosis not present

## 2017-06-13 NOTE — Patient Instructions (Signed)
-   Aim to not drink 15 min before eating, not while eating, and waiting 30 min after eating.  - Begin to reduce caffeine intake as we approach surgery date.

## 2017-06-13 NOTE — Progress Notes (Signed)
Appt start time: 8:00 end time: 8:15  Assessment: 5th SWL Appointment.   Start Wt at [facility]: 289.9 Wt: 292.9 BMI: 37.61  Food allergy: eggs  Pt arrives having gained 2 lbs from previous visit. Pt states he has decided on the sleeve gastrectomy. Pt states he is still working on not drinking with meals. Pt states he has added in greek yogurt at bedtime. Pt states his BS are doing great. Pt reports checking BS 1-2x day: FBS (105). Pt he has been remodeling front porch as physical activity. Pt states he has found Equate version of protein shake with 15 g of protein and 3g carbohydrates (chocolate); enjoys.   Pt states he's hoping to get enough education to not need surgery and be able to lose weight on his own. Pt needs 6 SWL visits with [place] prior to surgery. Pt states remaining SWL visits have been scheduled.   Preferred Learning Style:   No preference indicated   Learning Readiness:   Ready  Change in progress  MEDICATIONS: See list   DIETARY INTAKE:  24-hr recall:  B ( AM): banana, peanut butter toast, greek yogurt Snk ( AM): peanut butter crackers (sometimes)  L ( PM): hot dogs Snk ( PM): apple D ( PM): grilled pork chops, grilled vegetables or salad with homemade dressing Snk ( PM): low-fat yogurt, glass of milk  Beverages: 6 cups coffee, water, unsweetened tea (w/ splenda)  Usual physical activity: 15-20 min of arm exercises 3-4 times a week, remodeling his front proch  Diet to Follow: 1800 calories 200 g carbohydrates 135 g protein 50 g fat   Nutritional Diagnosis:  [place]-3.3 Overweight/obesity related to past poor dietary habits and physical inactivity as evidenced by patient w/ upcoming RYGB/sleeve gastrectomy surgery following dietary guidelines for continued weight loss.    Intervention:  Nutrition counseling for upcoming Bariatric Surgery. Goals: - Aim to not drink 15 min before eating, not while eating, and waiting 30 min after eating. - Begin to reduce caffeine  intake as we approach surgery date.    Teaching Method Utilized:  Visual Auditory Hands on  Handouts given during visit include:  none  Barriers to learning/adherence to lifestyle change: none  Demonstrated degree of understanding via:  Teach Back   Monitoring/Evaluation:  Dietary intake, exercise, and body weight in 1 month(s).

## 2017-06-19 ENCOUNTER — Ambulatory Visit (INDEPENDENT_AMBULATORY_CARE_PROVIDER_SITE_OTHER): Payer: PPO | Admitting: Psychiatry

## 2017-06-19 DIAGNOSIS — F509 Eating disorder, unspecified: Secondary | ICD-10-CM | POA: Diagnosis not present

## 2017-07-05 ENCOUNTER — Ambulatory Visit (INDEPENDENT_AMBULATORY_CARE_PROVIDER_SITE_OTHER): Payer: PPO | Admitting: Psychiatry

## 2017-07-05 DIAGNOSIS — F509 Eating disorder, unspecified: Secondary | ICD-10-CM

## 2017-07-09 ENCOUNTER — Encounter: Payer: PPO | Attending: Surgery | Admitting: Registered"

## 2017-07-09 ENCOUNTER — Telehealth: Payer: Self-pay | Admitting: Family Medicine

## 2017-07-09 ENCOUNTER — Encounter: Payer: Self-pay | Admitting: Registered"

## 2017-07-09 ENCOUNTER — Encounter: Payer: PPO | Admitting: Registered"

## 2017-07-09 DIAGNOSIS — Z713 Dietary counseling and surveillance: Secondary | ICD-10-CM | POA: Diagnosis not present

## 2017-07-09 DIAGNOSIS — E119 Type 2 diabetes mellitus without complications: Secondary | ICD-10-CM

## 2017-07-09 NOTE — Progress Notes (Signed)
Appt start time: 10:10 end time: 10:30  Assessment: 6th SWL Appointment.   Start Wt at [facility]: 289.9 Wt: 295.6 BMI: 37.95   Food allergy: eggs  Pt arrives having gained ~3 lbs from previous visit. Pt states he is drinking decaf coffee only at this point.  Pt reports checking BS 1-2x day: FBS (105-123). Pt states he is still working on not drinking with meals. Pt states he has found Equate version of protein shake with 15 g of protein and 3g carbohydrates (chocolate) and Atkins (strawberry and vanilla); enjoys. Pt is doing well and making great behavioral lifestyle changes.   Pt states he has decided on the sleeve gastrectomy. This is pt's last SWL visit with [place].     Preferred Learning Style:   No preference indicated   Learning Readiness:   Ready  Change in progress  MEDICATIONS: See list   DIETARY INTAKE:  24-hr recall:  B ( AM): banana, peanut butter toast, greek yogurt Snk ( AM): peanut butter crackers (sometimes)  L ( PM): hot dogs Snk ( PM): apple D ( PM): grilled pork chops, grilled vegetables or salad with homemade dressing Snk ( PM): low-fat yogurt, glass of milk  Beverages: 2% milk, 6 cups coffee, water, unsweetened tea (w/ splenda)  Usual physical activity: 15-20 min of arm exercises 3-4 times a week, remodeling his front proch  Diet to Follow: 1800 calories 200 g carbohydrates 135 g protein 50 g fat   Nutritional Diagnosis:  [place]-3.3 Overweight/obesity related to past poor dietary habits and physical inactivity as evidenced by patient w/ upcoming RYGB/sleeve gastrectomy surgery following dietary guidelines for continued weight loss.    Intervention:  Nutrition counseling for upcoming Bariatric Surgery. Goals: - Try Baritastic app on your tablet.  - Continue to aim to not drink 15 min before, not during, and waiting 30 min after eating.  - Take multivitamin complete with iron and Vitamin D 5000 IU up until surgery.    Teaching Method Utilized:   Visual Auditory  Handouts given during visit include:  none  Barriers to learning/adherence to lifestyle change: none  Demonstrated degree of understanding via:  Teach Back   Monitoring/Evaluation:  Dietary intake, exercise, and body weight prn.

## 2017-07-09 NOTE — Telephone Encounter (Signed)
Patient scheduled appointment on 07/16/17 for 6 mth follow up for diabetes and to discuss his upcoming bariatric surgery.  Patient wants to know if he will need lab work done before his 07/16/17 appointment.  If patient can't be reached, a detailed message can be left on his voice mail.

## 2017-07-09 NOTE — Telephone Encounter (Signed)
Yes please schedule a lab visit prior.

## 2017-07-09 NOTE — Telephone Encounter (Signed)
I spoke to patient's wife and she said she'll let patient know that he needs to schedule a lab appointment.

## 2017-07-09 NOTE — Progress Notes (Signed)
  Pre-Operative Nutrition Class:  Appt start time: 8:15   End time:  9:15  Patient was seen on 07/09/2017 for Pre-Operative Bariatric Surgery [facility].   Surgery date: 07/31/2017 Surgery type: Sleeve gastrectomy Start weight at [facility]: 289.9 Weight today: 295.6   Samples given per MNT protocol. Patient educated on appropriate usage: Bariatric Advantage Multivitamin Lot # [id number] Exp: 04/2018  Bariatric Advantage Calcium Citrate (lemon) Lot # [id number] Exp: 06/21/2018  Bariatric Advantage Calcium Citrate (peanut butter chocolate) Lot # 08/09/2017 Exp: [id number]  Renee Pain Protein Shake Lot # [id number] Exp: 03/04/2018   The following the learning objectives were met by the patient during this course:  Identify Pre-Op Dietary Goals and will begin 2 weeks pre-operatively  Identify appropriate sources of fluids and proteins   State protein recommendations and appropriate sources pre and post-operatively  Identify Post-Operative Dietary Goals and will follow for 2 weeks post-operatively  Identify appropriate multivitamin and calcium sources  Describe the need for physical activity post-operatively and will follow MD recommendations  State when to call healthcare provider regarding medication questions or post-operative complications  Handouts given during class include:  Pre-Op Bariatric Surgery Diet Handout  Protein Shake Handout  Post-Op Bariatric Surgery Nutrition Handout  BELT Program Information Flyer  Support Group Information Flyer  [facility] Bariatric Supplements Price List  Follow-Up Plan: Patient will follow-up at [facility] 2 weeks post operatively for diet advancement per MD.

## 2017-07-09 NOTE — Patient Instructions (Addendum)
-   Try Baritastic app on your tablet.   - Continue to aim to not drink 15 min before, not during, and waiting 30 min after eating.   - Take multivitamin complete with iron and Vitamin D 5000 IU up until surgery.

## 2017-07-11 ENCOUNTER — Ambulatory Visit: Payer: Self-pay | Admitting: Skilled Nursing Facility1

## 2017-07-11 ENCOUNTER — Other Ambulatory Visit (INDEPENDENT_AMBULATORY_CARE_PROVIDER_SITE_OTHER): Payer: PPO

## 2017-07-11 ENCOUNTER — Ambulatory Visit: Payer: Self-pay | Admitting: Registered"

## 2017-07-11 DIAGNOSIS — E119 Type 2 diabetes mellitus without complications: Secondary | ICD-10-CM | POA: Diagnosis not present

## 2017-07-11 DIAGNOSIS — I10 Essential (primary) hypertension: Secondary | ICD-10-CM | POA: Diagnosis not present

## 2017-07-11 LAB — COMPREHENSIVE METABOLIC PANEL
ALBUMIN: 4.3 g/dL (ref 3.5–5.2)
ALK PHOS: 43 U/L (ref 39–117)
ALT: 69 U/L — AB (ref 0–53)
AST: 46 U/L — ABNORMAL HIGH (ref 0–37)
BILIRUBIN TOTAL: 1.3 mg/dL — AB (ref 0.2–1.2)
BUN: 16 mg/dL (ref 6–23)
CALCIUM: 9.7 mg/dL (ref 8.4–10.5)
CO2: 30 mEq/L (ref 19–32)
Chloride: 102 mEq/L (ref 96–112)
Creatinine, Ser: 1.12 mg/dL (ref 0.40–1.50)
GFR: 73.54 mL/min (ref >60.00)
GLUCOSE: 126 mg/dL — AB (ref 70–99)
POTASSIUM: 4.9 meq/L (ref 3.5–5.1)
Sodium: 138 mEq/L (ref 135–145)
TOTAL PROTEIN: 7.5 g/dL (ref 6.0–8.3)

## 2017-07-11 LAB — LIPID PANEL
CHOLESTEROL: 162 mg/dL (ref 0–200)
HDL: 32.3 mg/dL — AB (ref >39.00)
LDL Cholesterol: 96 mg/dL (ref 0–99)
NONHDL: 129.48
Total CHOL/HDL Ratio: 5
Triglycerides: 166 mg/dL — ABNORMAL HIGH (ref 0.0–149.0)
VLDL: 33.2 mg/dL (ref 0.0–40.0)

## 2017-07-16 ENCOUNTER — Encounter: Payer: Self-pay | Admitting: Family Medicine

## 2017-07-16 ENCOUNTER — Ambulatory Visit (INDEPENDENT_AMBULATORY_CARE_PROVIDER_SITE_OTHER): Payer: PPO | Admitting: Family Medicine

## 2017-07-16 VITALS — BP 128/64 | HR 76 | Temp 98.1°F | Wt 296.0 lb

## 2017-07-16 DIAGNOSIS — E119 Type 2 diabetes mellitus without complications: Secondary | ICD-10-CM

## 2017-07-16 NOTE — Assessment & Plan Note (Signed)
>  25 minutes spent in face to face time with patient, >50% spent in counselling or coordination of care and discussing his upcoming surgery and diabetes. No changes made to rx today.

## 2017-07-16 NOTE — Progress Notes (Signed)
Subjective:   Patient ID: Corey Bell, male    DOB: 06/04/1966, 51 y.o.   MRN: [id number]  Corey Bell is a pleasant 51 y.o. year old male who presents to clinic today with Follow-up (discuss surgery)  on 07/16/2017  HPI:  Scheduled for bariatric surgery two weeks from tomorrow. He's excited but nervous.  DM- has been under good control.  Brings in his fasting FBS log from home- ranging 95-120s. Denies any episodes of hypoglycemia. Compliant with Metformin 500 mg daily.  In the process of getting bariatric surgery approved and hopes his diabetes will be diet controlled after surgery. Lab Results  Component Value Date   HGBA1C 6.5 01/18/2017    Lab Results  Component Value Date   CHOL 162 07/11/2017   HDL 32.30 (L) 07/11/2017   LDLCALC 96 07/11/2017   LDLDIRECT 93.0 04/07/2015   TRIG 166.0 (H) 07/11/2017   CHOLHDL 5 07/11/2017   Lab Results  Component Value Date   NA 138 07/11/2017   K 4.9 07/11/2017   CL 102 07/11/2017   CO2 30 07/11/2017   Lab Results  Component Value Date   CREATININE 1.12 07/11/2017     Current Outpatient Prescriptions on File Prior to Visit  Medication Sig Dispense Refill  . aspirin 325 MG tablet Take 325 mg by mouth daily. 2 TABS ONCE PO    . buPROPion (WELLBUTRIN XL) 300 MG 24 hr tablet Take 1 tablet (300 mg total) by mouth daily. 30 tablet 6  . cholecalciferol (VITAMIN D) 1000 units tablet Take 1,000 Units by mouth daily.    . citalopram (CELEXA) 40 MG tablet Take 1 tablet (40 mg total) by mouth daily. 30 tablet 6  . cyclobenzaprine (FLEXERIL) 10 MG tablet Take 1 tablet (10 mg total) by mouth at bedtime. 30 tablet 3  . erythromycin with ethanol (THERAMYCIN) 2 % external solution APPLY TWICE A DAY 60 mL 2  . ezetimibe (ZETIA) 10 MG tablet Take 1 tablet (10 mg total) by mouth daily. 90 tablet 3  . fexofenadine (ALLEGRA) 180 MG tablet Take 180 mg by mouth daily.    Marland Kitchen gabapentin (NEURONTIN) 800 MG tablet Take 800 mg by mouth 4 (four)  times daily.     Marland Kitchen glucose blood (FREESTYLE LITE) test strip Ck blood sugar twice a day. Dx E11.9 100 each 5  . glucose monitoring kit (FREESTYLE) monitoring kit 1 each by Does not apply route as needed for other. Use as directed 1 each 0  . ibuprofen (ADVIL,MOTRIN) 600 MG tablet Take 600 mg by mouth as needed.     Marland Kitchen levothyroxine (SYNTHROID, LEVOTHROID) 75 MCG tablet Take 1 tablet (75 mcg total) by mouth daily. 30 tablet 6  . lisinopril (PRINIVIL,ZESTRIL) 10 MG tablet Take 1 tablet (10 mg total) by mouth daily. 30 tablet 6  . metFORMIN (GLUCOPHAGE) 500 MG tablet TAKE 1 TABLET BY MOUTH EVERY DAY WITH BREAKFAST 30 tablet 6  . methocarbamol (ROBAXIN) 500 MG tablet Take 2 tablets (1,000 mg total) by mouth every 6 (six) hours as needed for muscle spasms. 60 tablet 3  . metoprolol succinate (TOPROL-XL) 50 MG 24 hr tablet TAKE 1 TABLET BY MOUTH EVERY DAY 90 tablet 3  . montelukast (SINGULAIR) 10 MG tablet TAKE ONE TABLET BY MOUTH EVERY NIGHT AT BEDTIME 90 tablet 3  . ONE TOUCH LANCETS MISC Check blood sugar once daily and as directed. Dx. E11.9 200 each 1  . oxyCODONE-acetaminophen (PERCOCET/ROXICET) 5-325 MG tablet Take 1 tablet by mouth  every 6 (six) hours as needed for severe pain. 30 tablet 0  . ranitidine (ZANTAC) 150 MG tablet Take 150 mg by mouth 2 (two) times daily.    . traMADol (ULTRAM) 50 MG tablet TAKE 1 TABLET BY MOUTH EVERY 8 HOURS AS NEEDED FOR MODERATE TO SEVEREPAIN 30 tablet 0  . vitamin B-12 (CYANOCOBALAMIN) 1000 MCG tablet Take 1,000 mcg by mouth daily.     No current facility-administered medications on file prior to visit.     Allergies  Allergen Reactions  . Augmentin [Amoxicillin-Pot Clavulanate] Diarrhea  . Demerol [Meperidine]     "goes crazy and has the strength of 10 men"  . Eggs Or Egg-Derived Products Diarrhea and Nausea And Vomiting  . Sulfamethoxazole Other (See Comments)    Blisters form in mouth  . Statins Rash  . Sulfa Antibiotics Rash  . Sulfacetamide Sodium  Rash    Past Medical History:  Diagnosis Date  . Anxiety   . Aphthous ulcer 10-04-12   improving  . Carpal tunnel syndrome, bilateral   . Chronic pain syndrome   . COPD (chronic obstructive pulmonary disease) ([place])   . Depression   . Diabetes mellitus without complication ([place])   . Diverticulitis of colon   . Esophageal reflux   . H/O acute prostatitis   . Headache(784.0)    hx of migraines  . Hypothyroidism   . Insomnia, unspecified   . Lumbar degenerative disc disease   . Mixed hyperlipidemia   . Peyronie disease 12-20-12   PER uROLOGY  . Seasonal allergies   . Sleep apnea    uses cpap  . Tympanic membrane perforation    history of  . Unspecified essential hypertension     Past Surgical History:  Procedure Laterality Date  . LumbarSacral Disc Surgery     L5-S1  . MENISCUS REPAIR Left    torn meniscus  . NESBIT PROCEDURE N/A 08/22/2013   Procedure: 16 DOT PLACTATION;  Surgeon: Claybon Jabs, MD;  Location: [facility];  Service: Urology;  Laterality: N/A;  . TYMPANOSTOMY TUBE PLACEMENT  1975  . VASECTOMY Bilateral 08/22/2013   Procedure: VASECTOMY;  Surgeon: Claybon Jabs, MD;  Location: [facility];  Service: Urology;  Laterality: Bilateral;    Family History  Problem Relation Age of Onset  . Congestive Heart Failure Father   . Heart disease Father   . COPD Father   . Renal cancer Paternal Grandfather        renal call carcinoma  . Heart disease Brother        stents at 66 yo  . Bladder Cancer Paternal Uncle   . Renal cancer Paternal Uncle        renal cell carcinoma  . Heart disease Other   . Parkinsonism Neg Hx     Social History   Social History  . Marital status: Married    Spouse name: [name]  . Number of children: 0  . Years of education: college   Occupational History  . disabled Nurse, mental health  .  Unemployed   Social History Main Topics  . Smoking status: Former Smoker    Packs/day: 1.50    Years:  30.00    Types: Cigarettes    Quit date: 10/10/2007  . Smokeless tobacco: Never Used  . Alcohol use 0.6 oz/week    1 Cans of beer per week     Comment: rarely  . Drug use: No  . Sexual activity: Not on file  Other Topics Concern  . Not on file   Social History Narrative   Married.  [name])  No children. Patient has Best boy.   In process of getting disability for low back pain.   Right handed.   Caffeine- Coffee a pot but not every day.   The PMH, PSH, Social History, Family History, Medications, and allergies have been reviewed in [facility], and have been updated if relevant.   Review of Systems  Constitutional: Negative.   Endocrine: Negative.   Musculoskeletal: Negative.   Neurological: Negative.   Psychiatric/Behavioral: Negative.   All other systems reviewed and are negative.      Objective:    BP 128/64   Pulse 76   Temp 98.1 F (36.7 C) (Oral)   Wt 296 lb (134.3 kg)   BMI 38.00 kg/m    Physical Exam  Constitutional: He is oriented to person, place, and time. He appears well-developed and well-nourished. No distress.  HENT:  Head: Normocephalic and atraumatic.  Eyes: Conjunctivae are normal.  Cardiovascular: Normal rate.   Pulmonary/Chest: Effort normal.  Musculoskeletal: Normal range of motion. He exhibits no edema.  Neurological: He is alert and oriented to person, place, and time. No cranial nerve deficit.  Skin: Skin is warm and dry. He is not diaphoretic.  Psychiatric: He has a normal mood and affect. His behavior is normal. Judgment and thought content normal.  Nursing note and vitals reviewed.         Assessment & Plan:   No diagnosis found. No Follow-up on file.

## 2017-07-20 DIAGNOSIS — E1169 Type 2 diabetes mellitus with other specified complication: Secondary | ICD-10-CM | POA: Diagnosis not present

## 2017-07-20 DIAGNOSIS — Z9989 Dependence on other enabling machines and devices: Secondary | ICD-10-CM | POA: Diagnosis not present

## 2017-07-20 DIAGNOSIS — K8020 Calculus of gallbladder without cholecystitis without obstruction: Secondary | ICD-10-CM | POA: Diagnosis not present

## 2017-07-20 DIAGNOSIS — E669 Obesity, unspecified: Secondary | ICD-10-CM | POA: Diagnosis not present

## 2017-07-20 DIAGNOSIS — G4733 Obstructive sleep apnea (adult) (pediatric): Secondary | ICD-10-CM | POA: Diagnosis not present

## 2017-07-21 ENCOUNTER — Other Ambulatory Visit: Payer: Self-pay | Admitting: Surgery

## 2017-07-26 NOTE — Progress Notes (Signed)
Labs 07/11/17 cmp in epic

## 2017-07-26 NOTE — Patient Instructions (Signed)
Corey Bell  07/26/2017   Your procedure is scheduled on: 07/31/17  Report to [facility] Main  Entrance Take Alfred  elevators to 3rd floor to  [facility] at     [phone].    Call this number if you have problems the morning of surgery [phone]    Remember: ONLY 1 PERSON MAY GO WITH YOU TO SHORT STAY TO GET  READY MORNING OF YOUR SURGERY.  Do not eat food or drink liquids :After Midnight.     Take these medicines the morning of surgery with A SIP OF WATER: Zantac, protonix, metoprolol  If it is your day, synthroid, gabapentin, celexa, wellbutrin DO NOT TAKE ANY DIABETIC MEDICATIONS DAY OF YOUR SURGERY                               You may not have any metal on your body including hair pins and              piercings  Do not wear jewelry,  lotions, powders or perfumes, deodorant                      Men may shave face and neck.   Do not bring valuables to the hospital. [place].  Contacts, dentures or bridgework may not be worn into surgery.  Leave suitcase in the car. After surgery it may be brought to your room.                Please read over the following fact sheets you were given: _____________________________________________________________________          [facility] - Preparing for Surgery Before surgery, you can play an important role.  Because skin is not sterile, your skin needs to be as free of germs as possible.  You can reduce the number of germs on your skin by washing with CHG (chlorahexidine gluconate) soap before surgery.  CHG is an antiseptic cleaner which kills germs and bonds with the skin to continue killing germs even after washing. Please DO NOT use if you have an allergy to CHG or antibacterial soaps.  If your skin becomes reddened/irritated stop using the CHG and inform your nurse when you arrive at Short Stay. Do not shave (including legs and underarms) for at least 48  hours prior to the first CHG shower.  You may shave your face/neck. Please follow these instructions carefully:  1.  Shower with CHG Soap the night before surgery and the  morning of Surgery.  2.  If you choose to wash your hair, wash your hair first as usual with your  normal  shampoo.  3.  After you shampoo, rinse your hair and body thoroughly to remove the  shampoo.                           4.  Use CHG as you would any other liquid soap.  You can apply chg directly  to the skin and wash                       Gently with a scrungie or clean washcloth.  5.  Apply  the CHG Soap to your body ONLY FROM THE NECK DOWN.   Do not use on face/ open                           Wound or open sores. Avoid contact with eyes, ears mouth and genitals (private parts).                       Wash face,  Genitals (private parts) with your normal soap.             6.  Wash thoroughly, paying special attention to the area where your surgery  will be performed.  7.  Thoroughly rinse your body with warm water from the neck down.  8.  DO NOT shower/wash with your normal soap after using and rinsing off  the CHG Soap.                9.  Pat yourself dry with a clean towel.            10.  Wear clean pajamas.            11.  Place clean sheets on your bed the night of your first shower and do not  sleep with pets. Day of Surgery : Do not apply any lotions/deodorants the morning of surgery.  Please wear clean clothes to the hospital/surgery center.  FAILURE TO FOLLOW THESE INSTRUCTIONS MAY RESULT IN THE CANCELLATION OF YOUR SURGERY PATIENT SIGNATURE_________________________________  NURSE SIGNATURE__________________________________  ________________________________________________________________________   Corey Bell  An incentive spirometer is a tool that can help keep your lungs clear and active. This tool measures how well you are filling your lungs with each breath. Taking long deep breaths may help  reverse or decrease the chance of developing breathing (pulmonary) problems (especially infection) following:  A long period of time when you are unable to move or be active. BEFORE THE PROCEDURE   If the spirometer includes an indicator to show your best effort, your nurse or respiratory therapist will set it to a desired goal.  If possible, sit up straight or lean slightly forward. Try not to slouch.  Hold the incentive spirometer in an upright position. INSTRUCTIONS FOR USE  1. Sit on the edge of your bed if possible, or sit up as far as you can in bed or on a chair. 2. Hold the incentive spirometer in an upright position. 3. Breathe out normally. 4. Place the mouthpiece in your mouth and seal your lips tightly around it. 5. Breathe in slowly and as deeply as possible, raising the piston or the ball toward the top of the column. 6. Hold your breath for 3-5 seconds or for as long as possible. Allow the piston or ball to fall to the bottom of the column. 7. Remove the mouthpiece from your mouth and breathe out normally. 8. Rest for a few seconds and repeat Steps 1 through 7 at least 10 times every 1-2 hours when you are awake. Take your time and take a few normal breaths between deep breaths. 9. The spirometer may include an indicator to show your best effort. Use the indicator as a goal to work toward during each repetition. 10. After each set of 10 deep breaths, practice coughing to be sure your lungs are clear. If you have an incision (the cut made at the time of surgery), support your incision when coughing by placing a pillow or rolled  up towels firmly against it. Once you are able to get out of bed, walk around indoors and cough well. You may stop using the incentive spirometer when instructed by your caregiver.  RISKS AND COMPLICATIONS  Take your time so you do not get dizzy or light-headed.  If you are in pain, you may need to take or ask for pain medication before doing incentive  spirometry. It is harder to take a deep breath if you are having pain. AFTER USE  Rest and breathe slowly and easily.  It can be helpful to keep track of a log of your progress. Your caregiver can provide you with a simple table to help with this. If you are using the spirometer at home, follow these instructions: [place] IF:   You are having difficultly using the spirometer.  You have trouble using the spirometer as often as instructed.  Your pain medication is not giving enough relief while using the spirometer.  You develop fever of 100.5 F (38.1 C) or higher. SEEK IMMEDIATE MEDICAL CARE IF:   You cough up bloody sputum that had not been present before.  You develop fever of 102 F (38.9 C) or greater.  You develop worsening pain at or near the incision site. MAKE SURE YOU:   Understand these instructions.  Will watch your condition.  Will get help right away if you are not doing well or get worse. Document Released: 02/05/2007 Document Revised: 12/18/2011 Document Reviewed: 04/08/2007 [facility] Patient Information 2014 [place], [place].   ________________________________________________________________________

## 2017-07-27 ENCOUNTER — Encounter (HOSPITAL_COMMUNITY)
Admission: RE | Admit: 2017-07-27 | Discharge: 2017-07-27 | Disposition: A | Discharge status: Home / Self Care | Payer: PPO | Source: Ambulatory Visit | Attending: Surgery | Admitting: Surgery

## 2017-07-27 ENCOUNTER — Encounter (HOSPITAL_COMMUNITY): Payer: Self-pay

## 2017-07-27 DIAGNOSIS — Z01812 Encounter for preprocedural laboratory examination: Secondary | ICD-10-CM | POA: Insufficient documentation

## 2017-07-27 DIAGNOSIS — E119 Type 2 diabetes mellitus without complications: Secondary | ICD-10-CM | POA: Insufficient documentation

## 2017-07-27 LAB — HEMOGLOBIN A1C
Hgb A1c MFr Bld: 6.1 % — ABNORMAL HIGH (ref 4.8–5.6)
MEAN PLASMA GLUCOSE: 128.37 mg/dL

## 2017-07-27 LAB — CBC WITH DIFFERENTIAL/PLATELET
BASOS ABS: 0.1 10*3/uL (ref 0.0–0.1)
BASOS PCT: 1 %
Eosinophils Absolute: 0.2 10*3/uL (ref 0.0–0.7)
Eosinophils Relative: 4 %
HEMATOCRIT: 43.8 % (ref 39.0–52.0)
Hemoglobin: 15.3 g/dL (ref 13.0–17.0)
LYMPHS PCT: 37 %
Lymphs Abs: 2.2 10*3/uL (ref 0.7–4.0)
MCH: 31.9 pg (ref 26.0–34.0)
MCHC: 34.9 g/dL (ref 30.0–36.0)
MCV: 91.3 fL (ref 78.0–100.0)
MONO ABS: 0.4 10*3/uL (ref 0.1–1.0)
Monocytes Relative: 8 %
NEUTROS ABS: 3 10*3/uL (ref 1.7–7.7)
Neutrophils Relative %: 50 %
Platelets: 192 10*3/uL (ref 150–400)
RBC: 4.8 MIL/uL (ref 4.22–5.81)
RDW: 12.7 % (ref 11.5–15.5)
WBC: 5.8 10*3/uL (ref 4.0–10.5)

## 2017-07-27 LAB — GLUCOSE, CAPILLARY: GLUCOSE-CAPILLARY: 121 mg/dL — AB (ref 65–99)

## 2017-07-28 ENCOUNTER — Other Ambulatory Visit: Payer: Self-pay | Admitting: Family Medicine

## 2017-07-29 NOTE — H&P (Signed)
[place]"  Location: Surgery [facility] Patient #: [id number] DOB: Mar 16, 1966 Married / Language: English / Race: White Male  History of Present Illness   The patient is a 51 year old male who presents with a complaint of wigth loss surgery.  The PCP is Dr. Arnette Norris.  The patient was referred by Dr. Tanja Port. He is here with his wife.  He has completed all her preop bariatric evaluation. He is ready to go ahead with lap sleeve gastrectomy. He has a tentative surgery date of 31 July 2017. I reviewed his labs with him. A significantly, he has gallstones. These are asymptomatic. I gave him literature on gallbladder disease. 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 Many of the protein drinks have access port as a formula. He is using Atkins protein drinks which worked for him. He also expressed concern about voiding and needing a catheter. This was a problem after his back surgery. He knows to bring his sleep apnea mask with him. We talked about NSAIDs after surgery. And I discussed the risk of NSAIDs after weight loss surgery. I went over with him again the operative procedure, the hospital stay, and recovery.  UGI - 04/26/2017 - mild nonspecific esophageal motility disorder [place] - 04/26/2017 - gall stones Psych eval - 06/19/2017 - Myrtie Cruise  HIstory of obesity: He listened to our online information session. He has tried a diabetic diet. He cannot tolerate eggs because this leads to belching and diarrhea. He's not been on any oral weight loss medicines because of the potential side effects.  He knows of a friend whose had a bypass in her friend whose had a sleeve. So he has some knowledge of the operations.  Per the [place], the patient is a candidate for bariatric surgery. The patient attended our initial information session and  reviewed the types of bariatric surgery.  The patient is trying to decide between the sleeve gastrectomy vs the gastric bypass. I discussed with the patient the indications and risks of bariatric surgery. The potential risks of surgery include, but are not limited to, bleeding, infection, leak from the bowel, DVT and PE, open surgery, long term nutrition consequences, and death. The patient understands the importance of compliance and long term follow-up with our group after surgery.  Past Medical History: 1. DM since 2013 2. OSA x 2years on CPAP 3. DJD - back 4. Neurologic issues post back surgery - still has pain down left leg He had a L5-S1 disk by Dr. Rolena Infante in Feb 2012. Dr. Donnetta Hutching did the exposure through an anterior approach. 5. GERD 6. HTN x 15 years He is seen by Dr. Aundra Dubin x 9 years. Dr. Aundra Dubin took care of his father. 7. history of diverticulosis 8. HIstory of kidney stones He has had prostate infection He has had Peyronie's disease Has seen Dr. Karsten Ro 9. hypothyroid On synthroid since 2016 10. Left knee - Dr. Theda Sers 11. He is on chronic disability.  12. Pain meds - he takes Tramadol 4/7 days a week. He gets a presciption of hydrocodone (#30) and it last a year  Social History: He is accompanied by his wife, [name]. He is on chronic disability. They have no children.   Allergies (Tanisha A. Owens Shark, RMA; 07/20/2017 4:19 PM) Augmentin *PENICILLINS*  Diarrhea. Demerol *ANALGESICS - OPIOID*  Eggs  Diarrhea, Nausea, Vomiting. Sulfamethoxazole *SULFONAMIDES*  Statins Depletion *DIETARY PRODUCTS/DIETARY MANAGEMENT PRODUCTS*  Rash. Sulfa Antibiotics  Rash.  SULFACETAMIDE SODIUM  Rash. Allergies Reconciled   Medication History (Tanisha A. Owens Shark, [place]; 07/20/2017 4:20 PM) Wellbutrin XL (300MG  Tablet ER 24HR, Oral daily) Active. Vitamin D (1000UNIT Tablet, Oral daily) Active. CeleXA (40MG  Tablet, Oral daily)  Active. Cyanocobalamin (1000MCG Tablet, Oral daily) Active. Erythromycin (2% Solution, External twice a day) Active. Ezetimibe-Atorvastatin (10-10MG  Tablet, Oral daily) Active. Gabapentin (800MG  Tablet, Oral four times daily) Active. FreeStyle Lite Test (In Vitro) Active. Ibuprofen (600MG  Tablet, Oral as needed) Active. Lancets Misc. Active. Levothyroxine Sodium (75MCG Tablet, Oral daily) Active. Lisinopril (10MG  Tablet, Oral daily) Active. MetFORMIN HCl (500MG  Tablet, Oral daily) Active. Robaxin (500MG  Tablet, 2 tabs Oral every six hours) Active. Metoprolol Succinate (50MG  Tablet ER, Oral daily) Active. Singulair (10MG  Tablet, 1 tab Oral at bedtime) Active. Oxycodone-Acetaminophen (5-325MG  Tablet, 1 tab Oral every 6 hours as needed for pain) Active. RaNITidine HCl (150MG  Tablet, Oral two times daily) Active. TraMADol HCl (50MG  Tablet, 1 tab Oral every eight hours as needed) Active. Aspirin (325MG  Tablet, 2 tabs Oral daily) Active. Blood Glucose Monitoring Suppl Active. Medications Reconciled  Vitals (Tanisha A. Brown RMA; 07/20/2017 4:19 PM) 07/20/2017 4:18 PM Weight: 293.4 lb Height: 74in Body Surface Area: 2.56 m Body Mass Index: 37.67 kg/m  Temp.: 97.71F  Pulse: 61 (Regular)  BP: 124/82 (Sitting, Left Arm, Standard)    Physical Exam  General: Obese WM who is alert and generally healthy appearing. Skin: Inspection and palpation of the skin unremarkable.  Eyes: Conjunctivae white, pupils equal. Face, ears, nose, mouth, and throat: Face - normal. Normal ears and nose. Lips and teeth normal.  Neck: Supple. No mass. Trachea midline. No thyroid mass.  Lymph Nodes: No supraclavicular or cervical adenopathy. No axillary adenopathy.  Lungs: Normal respiratory effort. Clear to auscultation and symmetric breath sounds. Cardiovascular: Regular rate and rythm. Normal auscultation of the heart. No murmur or rub.  Abdomen: Soft. No mass.  Liver and spleen not palpable. No tenderness. No hernia. Normal bowel sounds.  He has a transverse LLQ scar from the back exposure surgery He is more apple than pear. Rectal: Not done.  Musculoskeletal/extremities: Normal gait. Good strength and ROM in upper and lower extremities.   Neurologic: Grossly intact to motor and sensory function.   Psychiatric: Has normal mood and affect. Judgement and insight appear normal.    Assessment & Plan  1.  MORBID OBESITY (E66.01)  Plan:   1) Sleeve gastrectomy scheduled for 07/31/2017   2) Sign consent   3) Meds for surgery    Started OxyCODONE HCl 5MG /5ML, 5-10 Milliliter every four hours, as needed, 120 Milliliter, 07/20/2017, No Refill.    Started Zofran 4MG , 1 (one) Tablet every eight hours, as needed, #15, 07/20/2017, Ref. x1.    Started Protonix 40MG , 1 (one) Tablet daily, #30, 07/20/2017, Ref. X1.  2. DIABETES MELLITUS TYPE 2 IN OBESE (E11.69)  Since 2013 3.  OBSTRUCTIVE SLEEP APNEA ON CPAP (G47.33) 4.  GALL STONES (K80.20)  4. DJD - back 5. Neurologic issues post back surgery - still has pain down left leg He had a L5-S1 disk by Dr. Rolena Infante in Feb 2012. Dr. Donnetta Hutching did the exposure through an anterior approach. 6. GERD 7. HTN x 15 years  He is seen by Dr. Aundra Dubin x 9 years. Dr. Aundra Dubin took care of his father. 8. history of diverticulosis 9. HIstory of kidney stones He has had prostate infection He has had Peyronie's disease Has seen Dr. Karsten Ro 10. hypothyroid On synthroid since 2016 11. Left knee - Dr. Theda Sers 12. He  is on chronic disability.  13. Pain meds - he takes Tramadol 4/7 days a week. He gets a presciption of hydrocodone (#30) and it last a year   Alphonsa Overall, MD, [facility] Surgery Pager: [phone] Office phone:  [phone]

## 2017-07-30 ENCOUNTER — Ambulatory Visit: Payer: Self-pay | Admitting: Family Medicine

## 2017-07-31 ENCOUNTER — Encounter (HOSPITAL_COMMUNITY)
Admission: RE | Disposition: A | Discharge status: Home / Self Care | Payer: Self-pay | Source: Ambulatory Visit | Attending: Surgery

## 2017-07-31 ENCOUNTER — Inpatient Hospital Stay (HOSPITAL_COMMUNITY)
Admission: RE | Admit: 2017-07-31 | Discharge: 2017-08-01 | DRG: 621 | Disposition: A | Discharge status: Home / Self Care | Payer: PPO | Source: Ambulatory Visit | Attending: Surgery | Admitting: Surgery

## 2017-07-31 ENCOUNTER — Inpatient Hospital Stay (HOSPITAL_COMMUNITY): Payer: PPO | Admitting: Anesthesiology

## 2017-07-31 ENCOUNTER — Encounter (HOSPITAL_COMMUNITY): Payer: Self-pay | Admitting: *Deleted

## 2017-07-31 DIAGNOSIS — J449 Chronic obstructive pulmonary disease, unspecified: Secondary | ICD-10-CM | POA: Diagnosis present

## 2017-07-31 DIAGNOSIS — F419 Anxiety disorder, unspecified: Secondary | ICD-10-CM | POA: Diagnosis not present

## 2017-07-31 DIAGNOSIS — K219 Gastro-esophageal reflux disease without esophagitis: Secondary | ICD-10-CM | POA: Diagnosis present

## 2017-07-31 DIAGNOSIS — Z882 Allergy status to sulfonamides status: Secondary | ICD-10-CM

## 2017-07-31 DIAGNOSIS — E119 Type 2 diabetes mellitus without complications: Secondary | ICD-10-CM | POA: Insufficient documentation

## 2017-07-31 DIAGNOSIS — K8020 Calculus of gallbladder without cholecystitis without obstruction: Secondary | ICD-10-CM | POA: Diagnosis present

## 2017-07-31 DIAGNOSIS — N486 Induration penis plastica: Secondary | ICD-10-CM | POA: Diagnosis not present

## 2017-07-31 DIAGNOSIS — E039 Hypothyroidism, unspecified: Secondary | ICD-10-CM | POA: Diagnosis present

## 2017-07-31 DIAGNOSIS — E1122 Type 2 diabetes mellitus with diabetic chronic kidney disease: Secondary | ICD-10-CM

## 2017-07-31 DIAGNOSIS — Z7984 Long term (current) use of oral hypoglycemic drugs: Secondary | ICD-10-CM

## 2017-07-31 DIAGNOSIS — Z87442 Personal history of urinary calculi: Secondary | ICD-10-CM

## 2017-07-31 DIAGNOSIS — Z885 Allergy status to narcotic agent status: Secondary | ICD-10-CM

## 2017-07-31 DIAGNOSIS — M479 Spondylosis, unspecified: Secondary | ICD-10-CM | POA: Diagnosis present

## 2017-07-31 DIAGNOSIS — Z91012 Allergy to eggs: Secondary | ICD-10-CM

## 2017-07-31 DIAGNOSIS — Z6837 Body mass index (BMI) 37.0-37.9, adult: Secondary | ICD-10-CM | POA: Diagnosis not present

## 2017-07-31 DIAGNOSIS — Z881 Allergy status to other antibiotic agents status: Secondary | ICD-10-CM

## 2017-07-31 DIAGNOSIS — Z87891 Personal history of nicotine dependence: Secondary | ICD-10-CM

## 2017-07-31 DIAGNOSIS — G4733 Obstructive sleep apnea (adult) (pediatric): Secondary | ICD-10-CM | POA: Diagnosis present

## 2017-07-31 DIAGNOSIS — F329 Major depressive disorder, single episode, unspecified: Secondary | ICD-10-CM | POA: Diagnosis not present

## 2017-07-31 DIAGNOSIS — I10 Essential (primary) hypertension: Secondary | ICD-10-CM | POA: Diagnosis present

## 2017-07-31 DIAGNOSIS — Z79899 Other long term (current) drug therapy: Secondary | ICD-10-CM

## 2017-07-31 HISTORY — PX: LAPAROSCOPIC GASTRIC SLEEVE RESECTION: SHX5895

## 2017-07-31 LAB — GLUCOSE, CAPILLARY
GLUCOSE-CAPILLARY: 134 mg/dL — AB (ref 65–99)
GLUCOSE-CAPILLARY: 157 mg/dL — AB (ref 65–99)
Glucose-Capillary: 121 mg/dL — ABNORMAL HIGH (ref 65–99)
Glucose-Capillary: 132 mg/dL — ABNORMAL HIGH (ref 65–99)
Glucose-Capillary: 146 mg/dL — ABNORMAL HIGH (ref 65–99)

## 2017-07-31 LAB — HEMOGLOBIN AND HEMATOCRIT, BLOOD
HCT: 41.6 % (ref 39.0–52.0)
Hemoglobin: 14.4 g/dL (ref 13.0–17.0)

## 2017-07-31 SURGERY — GASTRECTOMY, SLEEVE, LAPAROSCOPIC
Anesthesia: General | Site: Abdomen

## 2017-07-31 MED ORDER — PHENYLEPHRINE 40 MCG/ML (10ML) SYRINGE FOR IV PUSH (FOR BLOOD PRESSURE SUPPORT)
PREFILLED_SYRINGE | INTRAVENOUS | Status: AC
  Filled 2017-07-31: qty 10

## 2017-07-31 MED ORDER — DEXAMETHASONE SODIUM PHOSPHATE 10 MG/ML IJ SOLN
INTRAMUSCULAR | Status: DC | PRN
  Administered 2017-07-31: 8 mg via INTRAVENOUS

## 2017-07-31 MED ORDER — PROPOFOL 10 MG/ML IV BOLUS
INTRAVENOUS | Status: AC
  Filled 2017-07-31: qty 40

## 2017-07-31 MED ORDER — 0.9 % SODIUM CHLORIDE (POUR BTL) OPTIME
TOPICAL | Status: DC | PRN
  Administered 2017-07-31: 1000 mL

## 2017-07-31 MED ORDER — TISSEEL VH 10 ML EX KIT
PACK | CUTANEOUS | Status: AC
  Filled 2017-07-31: qty 1

## 2017-07-31 MED ORDER — LACTATED RINGERS IV SOLN
INTRAVENOUS | Status: DC
  Administered 2017-07-31 (×3): via INTRAVENOUS

## 2017-07-31 MED ORDER — CHLORHEXIDINE GLUCONATE 4 % EX LIQD: 60.0000 mL | Freq: Once | CUTANEOUS | Status: DC

## 2017-07-31 MED ORDER — SCOPOLAMINE 1 MG/3DAYS TD PT72
1.0000 | MEDICATED_PATCH | TRANSDERMAL | Status: DC
  Administered 2017-07-31: 1.5 mg via TRANSDERMAL
  Filled 2017-07-31: qty 1

## 2017-07-31 MED ORDER — ONDANSETRON HCL 4 MG/2ML IJ SOLN
INTRAMUSCULAR | Status: DC | PRN
  Administered 2017-07-31: 4 mg via INTRAVENOUS

## 2017-07-31 MED ORDER — ROCURONIUM BROMIDE 10 MG/ML (PF) SYRINGE
PREFILLED_SYRINGE | INTRAVENOUS | Status: DC | PRN
  Administered 2017-07-31: 20 mg via INTRAVENOUS
  Administered 2017-07-31: 50 mg via INTRAVENOUS
  Administered 2017-07-31 (×2): 15 mg via INTRAVENOUS

## 2017-07-31 MED ORDER — ROCURONIUM BROMIDE 50 MG/5ML IV SOSY
PREFILLED_SYRINGE | INTRAVENOUS | Status: AC
  Filled 2017-07-31: qty 5

## 2017-07-31 MED ORDER — PREMIER PROTEIN SHAKE
2.0000 [oz_av] | ORAL | Status: DC
  Administered 2017-08-01: 2 [oz_av] via ORAL

## 2017-07-31 MED ORDER — HYDROMORPHONE HCL 1 MG/ML IJ SOLN
INTRAMUSCULAR | Status: AC
  Filled 2017-07-31: qty 2

## 2017-07-31 MED ORDER — METOCLOPRAMIDE HCL 5 MG/ML IJ SOLN
INTRAMUSCULAR | Status: AC
  Filled 2017-07-31: qty 2

## 2017-07-31 MED ORDER — FENTANYL CITRATE (PF) 100 MCG/2ML IJ SOLN
INTRAMUSCULAR | Status: DC | PRN
  Administered 2017-07-31: 50 ug via INTRAVENOUS
  Administered 2017-07-31 (×2): 25 ug via INTRAVENOUS

## 2017-07-31 MED ORDER — PANTOPRAZOLE SODIUM 40 MG IV SOLR
40.0000 mg | Freq: Every day | INTRAVENOUS | Status: DC
  Administered 2017-07-31: 40 mg via INTRAVENOUS
  Filled 2017-07-31: qty 40

## 2017-07-31 MED ORDER — DEXAMETHASONE SODIUM PHOSPHATE 10 MG/ML IJ SOLN
INTRAMUSCULAR | Status: AC
  Filled 2017-07-31: qty 4

## 2017-07-31 MED ORDER — OXYCODONE HCL 5 MG/5ML PO SOLN
5.0000 mg | ORAL | Status: DC | PRN
  Administered 2017-07-31 – 2017-08-01 (×3): 5 mg via ORAL
  Filled 2017-07-31 (×3): qty 5

## 2017-07-31 MED ORDER — HEPARIN SODIUM (PORCINE) 5000 UNIT/ML IJ SOLN
5000.0000 [IU] | Freq: Three times a day (TID) | INTRAMUSCULAR | Status: DC
  Administered 2017-07-31 – 2017-08-01 (×2): 5000 [IU] via SUBCUTANEOUS
  Filled 2017-07-31: qty 1

## 2017-07-31 MED ORDER — STERILE WATER FOR IRRIGATION IR SOLN
Status: DC | PRN
  Administered 2017-07-31: 2000 mL

## 2017-07-31 MED ORDER — GLYCOPYRROLATE 0.2 MG/ML IV SOSY
PREFILLED_SYRINGE | INTRAVENOUS | Status: DC | PRN
  Administered 2017-07-31 (×2): .2 mg via INTRAVENOUS

## 2017-07-31 MED ORDER — BUPIVACAINE-EPINEPHRINE (PF) 0.25% -1:200000 IJ SOLN
INTRAMUSCULAR | Status: AC
  Filled 2017-07-31: qty 30

## 2017-07-31 MED ORDER — PHENYLEPHRINE 40 MCG/ML (10ML) SYRINGE FOR IV PUSH (FOR BLOOD PRESSURE SUPPORT)
PREFILLED_SYRINGE | INTRAVENOUS | Status: DC | PRN
  Administered 2017-07-31 (×4): 80 ug via INTRAVENOUS
  Administered 2017-07-31: 120 ug via INTRAVENOUS
  Administered 2017-07-31: 80 ug via INTRAVENOUS

## 2017-07-31 MED ORDER — ACETAMINOPHEN 160 MG/5ML PO SOLN: 650.0000 mg | ORAL | Status: DC | PRN

## 2017-07-31 MED ORDER — HEPARIN SODIUM (PORCINE) 5000 UNIT/ML IJ SOLN
5000.0000 [IU] | INTRAMUSCULAR | Status: AC
  Administered 2017-07-31: 5000 [IU] via SUBCUTANEOUS
  Filled 2017-07-31: qty 1

## 2017-07-31 MED ORDER — SUGAMMADEX SODIUM 200 MG/2ML IV SOLN
INTRAVENOUS | Status: AC
  Filled 2017-07-31: qty 2

## 2017-07-31 MED ORDER — PROPOFOL 10 MG/ML IV BOLUS
INTRAVENOUS | Status: DC | PRN
  Administered 2017-07-31: 200 mg via INTRAVENOUS

## 2017-07-31 MED ORDER — MORPHINE SULFATE (PF) 4 MG/ML IV SOLN
INTRAVENOUS | Status: AC
  Filled 2017-07-31: qty 1

## 2017-07-31 MED ORDER — BUPIVACAINE LIPOSOME 1.3 % IJ SUSP
20.0000 mL | Freq: Once | INTRAMUSCULAR | Status: AC
  Administered 2017-07-31: 20 mL
  Filled 2017-07-31: qty 20

## 2017-07-31 MED ORDER — MORPHINE SULFATE (PF) 4 MG/ML IV SOLN
1.0000 mg | INTRAVENOUS | Status: DC | PRN
  Administered 2017-07-31: 3 mg via INTRAVENOUS
  Administered 2017-07-31: 1 mg via INTRAVENOUS
  Filled 2017-07-31: qty 1

## 2017-07-31 MED ORDER — LIDOCAINE HCL 2 % IJ SOLN
INTRAMUSCULAR | Status: AC
  Filled 2017-07-31: qty 20

## 2017-07-31 MED ORDER — EPHEDRINE SULFATE-NACL 50-0.9 MG/10ML-% IV SOSY
PREFILLED_SYRINGE | INTRAVENOUS | Status: DC | PRN
  Administered 2017-07-31: 5 mg via INTRAVENOUS
  Administered 2017-07-31: 10 mg via INTRAVENOUS
  Administered 2017-07-31: 5 mg via INTRAVENOUS
  Administered 2017-07-31 (×3): 10 mg via INTRAVENOUS

## 2017-07-31 MED ORDER — LACTATED RINGERS IR SOLN
Status: DC | PRN
  Administered 2017-07-31: 1000 mL

## 2017-07-31 MED ORDER — EPHEDRINE 5 MG/ML INJ
INTRAVENOUS | Status: AC
  Filled 2017-07-31: qty 20

## 2017-07-31 MED ORDER — HYDROMORPHONE HCL 1 MG/ML IJ SOLN
0.2500 mg | INTRAMUSCULAR | Status: DC | PRN
  Administered 2017-07-31: 0.5 mg via INTRAVENOUS

## 2017-07-31 MED ORDER — GABAPENTIN 300 MG PO CAPS
300.0000 mg | ORAL_CAPSULE | ORAL | Status: DC
  Filled 2017-07-31: qty 1

## 2017-07-31 MED ORDER — PHENYLEPHRINE HCL 10 MG/ML IJ SOLN
INTRAMUSCULAR | Status: AC
  Filled 2017-07-31: qty 1

## 2017-07-31 MED ORDER — APREPITANT 40 MG PO CAPS
40.0000 mg | ORAL_CAPSULE | ORAL | Status: AC
  Administered 2017-07-31: 40 mg via ORAL
  Filled 2017-07-31: qty 1

## 2017-07-31 MED ORDER — PHENYLEPHRINE HCL 10 MG/ML IJ SOLN
INTRAMUSCULAR | Status: DC | PRN
  Administered 2017-07-31: 25 ug/min via INTRAVENOUS

## 2017-07-31 MED ORDER — KCL IN DEXTROSE-NACL 20-5-0.45 MEQ/L-%-% IV SOLN
INTRAVENOUS | Status: DC
  Administered 2017-07-31: 17:00:00 via INTRAVENOUS
  Administered 2017-08-01: 125 mL/h via INTRAVENOUS
  Filled 2017-07-31 (×2): qty 1000

## 2017-07-31 MED ORDER — LIDOCAINE 2% (20 MG/ML) 5 ML SYRINGE
INTRAMUSCULAR | Status: DC | PRN
  Administered 2017-07-31: 1.5 mg/kg/h via INTRAVENOUS

## 2017-07-31 MED ORDER — SUGAMMADEX SODIUM 500 MG/5ML IV SOLN
INTRAVENOUS | Status: DC | PRN
  Administered 2017-07-31: 300 mg via INTRAVENOUS

## 2017-07-31 MED ORDER — ACETAMINOPHEN 500 MG PO TABS
1000.0000 mg | ORAL_TABLET | ORAL | Status: AC
Start: 2017-07-31 — End: 2017-07-31
  Administered 2017-07-31: 1000 mg via ORAL
  Filled 2017-07-31: qty 2

## 2017-07-31 MED ORDER — BUPIVACAINE-EPINEPHRINE (PF) 0.25% -1:200000 IJ SOLN
INTRAMUSCULAR | Status: DC | PRN
  Administered 2017-07-31: 30 mL

## 2017-07-31 MED ORDER — KETAMINE HCL 10 MG/ML IJ SOLN
INTRAMUSCULAR | Status: DC | PRN
  Administered 2017-07-31: 30 mg via INTRAVENOUS

## 2017-07-31 MED ORDER — INSULIN ASPART 100 UNIT/ML ~~LOC~~ SOLN
0.0000 [IU] | SUBCUTANEOUS | Status: DC
  Administered 2017-07-31 (×2): 3 [IU] via SUBCUTANEOUS
  Administered 2017-07-31: 4 [IU] via SUBCUTANEOUS
  Administered 2017-08-01 (×2): 3 [IU] via SUBCUTANEOUS

## 2017-07-31 MED ORDER — CEFOTETAN DISODIUM-DEXTROSE 2-2.08 GM-%(50ML) IV SOLR
2.0000 g | INTRAVENOUS | Status: AC
  Administered 2017-07-31: 2 g via INTRAVENOUS
  Filled 2017-07-31: qty 50

## 2017-07-31 MED ORDER — METOCLOPRAMIDE HCL 5 MG/ML IJ SOLN: 10.0000 mg | Freq: Once | INTRAMUSCULAR | Status: DC | PRN

## 2017-07-31 MED ORDER — ONDANSETRON HCL 4 MG/2ML IJ SOLN
INTRAMUSCULAR | Status: AC
  Filled 2017-07-31: qty 4

## 2017-07-31 MED ORDER — MIDAZOLAM HCL 5 MG/5ML IJ SOLN
INTRAMUSCULAR | Status: DC | PRN
  Administered 2017-07-31: 1 mg via INTRAVENOUS

## 2017-07-31 MED ORDER — ONDANSETRON HCL 4 MG/2ML IJ SOLN: 4.0000 mg | INTRAMUSCULAR | Status: DC | PRN

## 2017-07-31 MED ORDER — LIDOCAINE 2% (20 MG/ML) 5 ML SYRINGE
INTRAMUSCULAR | Status: DC | PRN
  Administered 2017-07-31: 100 mg via INTRAVENOUS

## 2017-07-31 MED ORDER — LIDOCAINE 2% (20 MG/ML) 5 ML SYRINGE
INTRAMUSCULAR | Status: AC
  Filled 2017-07-31: qty 5

## 2017-07-31 MED ORDER — PHENYLEPHRINE 40 MCG/ML (10ML) SYRINGE FOR IV PUSH (FOR BLOOD PRESSURE SUPPORT)
PREFILLED_SYRINGE | INTRAVENOUS | Status: AC
  Filled 2017-07-31: qty 30

## 2017-07-31 MED ORDER — MIDAZOLAM HCL 2 MG/2ML IJ SOLN
INTRAMUSCULAR | Status: AC
  Filled 2017-07-31: qty 2

## 2017-07-31 MED ORDER — KETAMINE HCL-SODIUM CHLORIDE 100-0.9 MG/10ML-% IV SOSY
PREFILLED_SYRINGE | INTRAVENOUS | Status: AC
  Filled 2017-07-31: qty 10

## 2017-07-31 MED ORDER — SUGAMMADEX SODIUM 500 MG/5ML IV SOLN
INTRAVENOUS | Status: AC
  Filled 2017-07-31: qty 5

## 2017-07-31 MED ORDER — ALBUMIN HUMAN 5 % IV SOLN
INTRAVENOUS | Status: AC
  Filled 2017-07-31: qty 500

## 2017-07-31 MED ORDER — FENTANYL CITRATE (PF) 100 MCG/2ML IJ SOLN
INTRAMUSCULAR | Status: AC
  Filled 2017-07-31: qty 2

## 2017-07-31 SURGICAL SUPPLY — 60 items
APPLICATOR COTTON TIP 6IN STRL (MISCELLANEOUS) IMPLANT
APPLIER CLIP ROT 10 11.4 M/L (STAPLE)
APPLIER CLIP ROT 13.4 12 LRG (CLIP)
BLADE SURG 15 STRL LF DISP TIS (BLADE) ×1 IMPLANT
BLADE SURG 15 STRL SS (BLADE) ×2
CABLE HIGH FREQUENCY MONO STRZ (ELECTRODE) IMPLANT
CHLORAPREP W/TINT 26ML (MISCELLANEOUS) ×3 IMPLANT
CLIP APPLIE ROT 10 11.4 M/L (STAPLE) IMPLANT
CLIP APPLIE ROT 13.4 12 LRG (CLIP) IMPLANT
DERMABOND ADVANCED (GAUZE/BANDAGES/DRESSINGS) ×2
DERMABOND ADVANCED .7 DNX12 (GAUZE/BANDAGES/DRESSINGS) ×1 IMPLANT
DEVICE SUT QUICK LOAD TK 5 (STAPLE) IMPLANT
DEVICE SUT TI-KNOT TK 5X26 (MISCELLANEOUS) IMPLANT
DEVICE SUTURE ENDOST 10MM (ENDOMECHANICALS) IMPLANT
DEVICE TI KNOT TK5 (MISCELLANEOUS)
DEVICE TROCAR PUNCTURE CLOSURE (ENDOMECHANICALS) IMPLANT
DISSECTOR BLUNT TIP ENDO 5MM (MISCELLANEOUS) IMPLANT
DRAPE UTILITY XL STRL (DRAPES) ×6 IMPLANT
ELECT REM PT RETURN 15FT ADLT (MISCELLANEOUS) ×3 IMPLANT
GAUZE SPONGE 4X4 12PLY STRL (GAUZE/BANDAGES/DRESSINGS) IMPLANT
GLOVE SURG SIGNA 7.5 PF LTX (GLOVE) ×3 IMPLANT
GOWN STRL REUS W/TWL XL LVL3 (GOWN DISPOSABLE) ×9 IMPLANT
HOVERMATT SINGLE USE (MISCELLANEOUS) ×3 IMPLANT
KIT BASIN OR (CUSTOM PROCEDURE TRAY) ×3 IMPLANT
MARKER SKIN DUAL TIP RULER LAB (MISCELLANEOUS) ×3 IMPLANT
NEEDLE SPNL 22GX3.5 QUINCKE BK (NEEDLE) ×3 IMPLANT
PACK UNIVERSAL I (CUSTOM PROCEDURE TRAY) ×3 IMPLANT
PENCIL BUTTON HOLSTER BLD 10FT (ELECTRODE) IMPLANT
QUICK LOAD TK 5 (STAPLE)
RELOAD STAPLER BLUE 60MM (STAPLE) IMPLANT
RELOAD STAPLER GOLD 60MM (STAPLE) IMPLANT
RELOAD STAPLER GREEN 60MM (STAPLE) ×2 IMPLANT
SCISSORS LAP 5X35 DISP (ENDOMECHANICALS) ×3 IMPLANT
SEALANT SURGICAL APPL DUAL CAN (MISCELLANEOUS) ×3 IMPLANT
SET IRRIG TUBING LAPAROSCOPIC (IRRIGATION / IRRIGATOR) ×3 IMPLANT
SHEARS HARMONIC ACE PLUS 45CM (MISCELLANEOUS) ×3 IMPLANT
SLEEVE ADV FIXATION 5X100MM (TROCAR) ×6 IMPLANT
SLEEVE GASTRECTOMY 36FR VISIGI (MISCELLANEOUS) ×3 IMPLANT
SOLUTION ANTI FOG 6CC (MISCELLANEOUS) ×3 IMPLANT
SPONGE LAP 18X18 X RAY DECT (DISPOSABLE) ×3 IMPLANT
STAPLER ECHELON LONG 60 440 (INSTRUMENTS) ×3 IMPLANT
STAPLER RELOAD BLUE 60MM (STAPLE)
STAPLER RELOAD GOLD 60MM (STAPLE)
STAPLER RELOAD GREEN 60MM (STAPLE) ×6
SUT MNCRL AB 4-0 PS2 18 (SUTURE) ×3 IMPLANT
SUT SURGIDAC NAB ES-9 0 48 120 (SUTURE) IMPLANT
SUT VICRYL 0 TIES 12 18 (SUTURE) IMPLANT
SYR 10ML ECCENTRIC (SYRINGE) ×3 IMPLANT
SYR CONTROL 10ML LL (SYRINGE) ×3 IMPLANT
TOWEL OR 17X26 10 PK STRL BLUE (TOWEL DISPOSABLE) ×3 IMPLANT
TOWEL OR NON WOVEN STRL DISP B (DISPOSABLE) ×3 IMPLANT
TROCAR ADV FIXATION 12X100MM (TROCAR) ×3 IMPLANT
TROCAR ADV FIXATION 5X100MM (TROCAR) ×3 IMPLANT
TROCAR BLADELESS 15MM (ENDOMECHANICALS) ×3 IMPLANT
TROCAR BLADELESS OPT 5 100 (ENDOMECHANICALS) ×3 IMPLANT
TUBING CONNECTING 10 (TUBING) ×4 IMPLANT
TUBING CONNECTING 10' (TUBING) ×2
TUBING ENDO SMARTCAP PENTAX (MISCELLANEOUS) ×3 IMPLANT
TUBING INSUF HEATED (TUBING) ×3 IMPLANT
VALVE SET DISP 3 PC AWS (VALVE) ×3 IMPLANT

## 2017-07-31 NOTE — Anesthesia Procedure Notes (Signed)
Procedure Name: Intubation Date/Time: 07/31/2017 10:05 AM Performed by: Yahsir Wickens, Virgel Gess Pre-anesthesia Checklist: Patient identified, Emergency Drugs available, Suction available and Patient being monitored Patient Re-evaluated:Patient Re-evaluated prior to induction Oxygen Delivery Method: Circle system utilized Preoxygenation: Pre-oxygenation with 100% oxygen Induction Type: IV induction Ventilation: Oral airway inserted - appropriate to patient size and Mask ventilation without difficulty Laryngoscope Size: Mac and 4 Grade View: Grade I Tube size: 7.5 mm Number of attempts: 1 Airway Equipment and Method: Stylet Placement Confirmation: ETT inserted through vocal cords under direct vision,  positive ETCO2 and breath sounds checked- equal and bilateral Secured at: 22 cm Tube secured with: Tape

## 2017-07-31 NOTE — Op Note (Signed)
PATIENT:   EUNICE OLDAKER DOB:   April 07, 1966 MRN:   [id number]  DATE OF PROCEDURE: 07/31/2017                   FACILITY:  [facility]  OPERATIVE REPORT  PREOPERATIVE DIAGNOSIS:  Morbid obesity.  POSTOPERATIVE DIAGNOSIS:  Morbid obesity (weight 293, BMI of 37.7).  PROCEDURE:  Laparoscopic Sleeve gastrectomy (intraoperative upper endoscopy by Dr. Georgiann Cocker) [photos in paper chart]  SURGEON:  Fenton Malling. Lucia Gaskins, MD  FIRST ASSISTANTGeorgiann Cocker, MD  ANESTHESIA:  General endotracheal.  Anesthesiologist: Josephine Igo, MD CRNA: Lavina Hamman, CRNA; Glory Buff, CRNA  General  ESTIMATED BLOOD LOSS:  Minimal.  LOCAL ANESTHESIA:  30 cc of 1/4% Marcaine + 20 cc of Exparel  COMPLICATIONS:  None.  INDICATION FOR SURGERY:  NORA ROOKE is a 51 y.o. white male who sees Lucille Passy, MD as her primary care doctor.  She has completed our preoperative bariatric program and now comes for a laparoscopic sleeve gastrectomy.  The indications, potential complications of surgery were explained to the patient.  Potential complications of the surgery include, but are not limited to, bleeding, infection, DVT, open surgery, and long-term nutritional consequences.  OPERATIVE NOTE:  The patient taken to room #1 at [facility] where Ms. Sarita Haver underwent a general endotracheal anesthetic, supervised by Anesthesiologist: Josephine Igo, MD CRNA: Lavina Hamman, CRNA; Glory Buff, CRNA.  The patient was given 2 g of cefotetan at the beginning of the procedure.  A time-out was held and surgical checklist run.  I accessed her abdominal cavity through the left upper quadrant with a 5 mm Optiview. I did an abdominal exploration.   Her omentum and bowel were unremarkable. The right and left lobes of the liver showed evidence of a fatty liver. Gallbladder looked normal (he had stones on [place]). Her stomach was unremarkable.   I placed a total of 6 trocars. I placed a 5 mm left lateral trocar, a 5 mm  left paramedian trocar (for the scope), a 12 mm right paramedian torcar, a 5 mm right subcostal trocar that I converted to a 15 mm to extract the stomach and 5 mm subxiphoid trocar for the liver retractor.  I placed a abdominal wall anesthetic block using a mixture of 1/4% Marcaine and Exparel.  I used 20 cc per side, for a total of 40 cc.  I started out taking down the greater curvature attachments of the stomach. I measured approximately 6 cm proximal from the pylorus and mobilized the greater curvature of the stomach with the Harmonic Scalpel. I took this dissection cranially around the greater curvature of her stomach to the angle of His and the left crus.   After I had mobilized the greater curvature of the stomach, I then passed the 36 French ViSiGi bougie which was used to suck the stomach up against the lesser curvature and placed into the antrum. During the staple firing,  I tried to give the [place] a cuff at least about 1 cm. I tried to avoid narrowing the incisura. I used a total of 7 staple firings.  From antrum to the angle of His, I used 2 green, 3 gold and 2 blue [place] 60 mm Ethicon staplers. I did not use staple line reinforcement.   At each firing of the EndoGIA stapler, I inspected the stomach, anterior wall of the stomach, and underneath to make sure there was no compromise or impingement on to the ViSiGi bougie.  The staple line seemed linear without any corkscrewing of the stomach. Hemostasis was good. I did not use any reinforcement. She did have at least 1 area of bleeding which I used clips to clip on the new greater curvature of the stomach.  Because I thought we had a good staple line, I then had the ViSiGi was converted to insufflate the pouch. The new stomach pouch was placed under water. There was no bubbling or leak noted.   At this point, Dr. Kae Heller broke scrub and passed an upper endoscope down through the esophagus into the stomach pouch. The stomach was tubular.  There was no narrowing of the stomach pouch or angulation. We were easily able to pass the endoscope into the antrum and again put air pressure on the staple line. I irrigated the upper abdomen with saline. There was no bubbling or evidence of air leak. The mucosa looked viable. Dr. Kae Heller decompressed the stomach with the endoscopy.   I converted to right subcostal trocar to a 15 trocar and extracted the stomach remnant through this intact and sent this to Pathology. I then placed 10 cc of Tisseel along the new greater curvature staple line and covered the entire staple line with the Tisseel.  I aspirated out the saline that I had irrigated because I thought the staple line looked viable and complete. There was no evidence of leak. I did not leave a drain in place.   I have a surgeon as a first assist to retract, expose, and assist on this difficult operation.  Then, I closed the trocar sites. I placed 0 Vicryl sutures at the 15-mm port site in the right upper quadrant. The other port sites seemed smaller not requiring sutures. I closed the skin at each site with a 4-0 Monocryl, painted each wound with LiquiBand.   The patient was transported to recovery room in good condition. Sponge and needle count were correct at the end of the case.    Alphonsa Overall, MD, [facility] Surgery Pager: [phone] Office phone:  [phone]

## 2017-07-31 NOTE — Anesthesia Postprocedure Evaluation (Signed)
Anesthesia Post Note  Patient: Corey Bell  Procedure(s) Performed: LAPAROSCOPIC GASTRIC SLEEVE RESECTION WITH UPPER ENDOSCOPY (N/A Abdomen)     Patient location during evaluation: PACU Anesthesia Type: General Level of consciousness: awake and alert and oriented Pain management: pain level controlled Vital Signs Assessment: post-procedure vital signs reviewed and stable Respiratory status: spontaneous breathing, nonlabored ventilation, respiratory function stable and patient connected to nasal cannula oxygen Cardiovascular status: blood pressure returned to baseline and stable Postop Assessment: no apparent nausea or vomiting Anesthetic complications: no    Last Vitals:  Vitals:   07/31/17 1245 07/31/17 1300  BP: (!) 109/57 117/60  Pulse: (!) 59 60  Resp: 15 13  Temp:    SpO2: 97% 97%    Last Pain:  Vitals:   07/31/17 1300  TempSrc:   PainSc: Asleep                 [name] A.

## 2017-07-31 NOTE — Transfer of Care (Signed)
Immediate Anesthesia Transfer of Care Note  Patient: Corey Bell  Procedure(s) Performed: Procedure(s): LAPAROSCOPIC GASTRIC SLEEVE RESECTION WITH UPPER ENDOSCOPY (N/A)  Patient Location: PACU  Anesthesia Type:General  Level of Consciousness:  sedated, patient cooperative and responds to stimulation  Airway & Oxygen Therapy:Patient Spontanous Breathing and Patient connected to face mask oxgen  Post-op Assessment:  Report given to PACU RN and Post -op Vital signs reviewed and stable  Post vital signs:  Reviewed and stable  Last Vitals:  Vitals:   07/31/17 0741  BP: 129/69  Pulse: (!) 55  Resp: 18  Temp: 36.6 C  SpO2: 95%    Complications: No apparent anesthesia complications

## 2017-07-31 NOTE — Op Note (Signed)
Preoperative diagnosis: laparoscopic sleeve gastrectomy  Postoperative diagnosis: Same   Procedure: Upper endoscopy   Surgeon: Clovis Riley, M.D.  Anesthesia: Gen.   Indications for procedure: This patient was undergoing a laparoscopic sleeve gastrectomy.   Description of procedure: The endoscopy was placed in the mouth and into the oropharynx and under endoscopic vision it was advanced to the esophagogastric junction. The pouch was insufflated and no bleeding or bubbles were seen. The GEJ was identified at 44cm from the teeth. There was no undue angulation or narrowing of the lumen. No bleeding or leaks were detected. The scope was withdrawn without difficulty.   Clovis Riley, M.D. General, Bariatric, & Minimally Invasive Surgery [facility]

## 2017-07-31 NOTE — Progress Notes (Signed)
Discussed post op day goals with patient including ambulation, IS, diet progression, pain, and nausea control.  Questions answered. 

## 2017-07-31 NOTE — Interval H&P Note (Signed)
History and Physical Interval Note:  07/31/2017 9:29 AM  Corey Bell  has presented today for surgery, with the diagnosis of MORBID OBESITY  The various methods of treatment have been discussed with the patient and family.   Wife at the bedside.  After consideration of risks, benefits and other options for treatment, the patient has consented to  Procedure(s): LAPAROSCOPIC GASTRIC SLEEVE RESECTION WITH UPPER ENDO (N/A) as a surgical intervention .  The patient's history has been reviewed, patient examined, no change in status, stable for surgery.  I have reviewed the patient's chart and labs.  Questions were answered to the patient's satisfaction.     Isabellarose Kope H

## 2017-07-31 NOTE — Anesthesia Preprocedure Evaluation (Signed)
Anesthesia Evaluation  Patient identified by MRN, date of birth, ID band Patient awake    Reviewed: Allergy & Precautions, NPO status , Patient's Chart, lab work & pertinent test results  Airway Mallampati: III  TM Distance: >3 FB Neck ROM: Full    Dental no notable dental hx. (+) Teeth Intact   Pulmonary asthma , sleep apnea and Continuous Positive Airway Pressure Ventilation , COPD,  COPD inhaler, former smoker,    Pulmonary exam normal breath sounds clear to auscultation       Cardiovascular hypertension, Pt. on medications Normal cardiovascular exam Rhythm:Regular Rate:Normal     Neuro/Psych  Headaches, PSYCHIATRIC DISORDERS Anxiety Depression  Neuromuscular disease    GI/Hepatic Neg liver ROS, GERD  Medicated and Controlled,  Endo/Other  diabetes, Well Controlled, Type 2, Oral Hypoglycemic AgentsHypothyroidism Morbid obesityHyperlipidemia  Renal/GU Renal InsufficiencyRenal disease  negative genitourinary   Musculoskeletal  (+) Arthritis , Osteoarthritis,    Abdominal (+) + obese,   Peds  Hematology   Anesthesia Other Findings   Reproductive/Obstetrics                             Anesthesia Physical Anesthesia Plan  ASA: III  Anesthesia Plan: General   Post-op Pain Management:    Induction: Intravenous  PONV Risk Score and Plan: 4 or greater and Ondansetron, Dexamethasone, Midazolam, Scopolamine patch - Pre-op and Propofol infusion  Airway Management Planned: Oral ETT  Additional Equipment:   Intra-op Plan:   Post-operative Plan: Extubation in OR  Informed Consent: I have reviewed the patients History and Physical, chart, labs and discussed the procedure including the risks, benefits and alternatives for the proposed anesthesia with the patient or authorized representative who has indicated his/her understanding and acceptance.   Dental advisory given  Plan Discussed with:  CRNA, Anesthesiologist and Surgeon  Anesthesia Plan Comments:         Anesthesia Quick Evaluation

## 2017-07-31 NOTE — Discharge Instructions (Signed)
° ° ° °GASTRIC BYPASS/SLEEVE ° Home Care Instructions ° ° These instructions are to help you care for yourself when you go home. ° °Call: If you have any problems. °• Call [phone] and ask for the surgeon on call °• If you need immediate assistance come to the ER at [facility]. Tell the ER staff you are a new post-op gastric bypass or gastric sleeve patient  °Signs and symptoms to report: • Severe  vomiting or nausea °o If you cannot handle clear liquids for longer than 1 day, call your surgeon °• Abdominal pain which does not get better after taking your pain medication °• Fever greater than 100.4°  F and chills °• Heart rate over 100 beats a minute °• Trouble breathing °• Chest pain °• Redness,  swelling, drainage, or foul odor at incision (surgical) sites °• If your incisions open or pull apart °• Swelling or pain in calf (lower leg) °• Diarrhea (Loose bowel movements that happen often), frequent watery, uncontrolled bowel movements °• Constipation, (no bowel movements for 3 days) if this happens: °o Take Milk of Magnesia, 2 tablespoons by mouth, 3 times a day for 2 days if needed °o Stop taking Milk of Magnesia once you have had a bowel movement °o Call your doctor if constipation continues °Or °o Take Miralax  (instead of Milk of Magnesia) following the label instructions °o Stop taking Miralax once you have had a bowel movement °o Call your doctor if constipation continues °• Anything you think is “abnormal for you” °  °Normal side effects after surgery: • Unable to sleep at night or unable to concentrate °• Irritability °• Being tearful (crying) or depressed ° °These are common complaints, possibly related to your anesthesia, stress of surgery, and change in lifestyle, that usually go away a few weeks after surgery. If these feelings continue, call your medical doctor.  °Wound Care: You may have surgical glue, steri-strips, or staples over your incisions after surgery °• Surgical glue: Looks like clear  film over your incisions and will wear off a little at a time °• Steri-strips: Adhesive strips of tape over your incisions. You may notice a yellowish color on skin under the steri-strips. This is used to make the steri-strips stick better. Do not pull the steri-strips off - let them fall off °• Staples: Staples may be removed before you leave the hospital °o If you go home with staples, call [facility] for an appointment with your surgeon’s nurse to have staples removed 10 days after surgery, [phone] °• Showering: You may shower two (2) days after your surgery unless your surgeon tells you differently °o Wash gently around incisions with warm soapy water, rinse well, and gently pat dry °o If you have a drain (tube from your incision), you may need someone to hold this while you shower °o No tub baths until staples are removed and incisions are healed °  °Medications: • Medications should be liquid or crushed if larger than the size of a dime °• Extended release pills (medication that releases a little bit at a time through the  day) should not be crushed °• Depending on the size and number of medications you take, you may need to space (take a few throughout the day)/change the time you take your medications so that you do not over-fill your pouch (smaller stomach) °• Make sure you follow-up with you primary care physician to make medication changes needed during rapid weight loss and life -style changes °•   If you have diabetes, follow up with your doctor that orders your diabetes medication(s) within one week after surgery and check your blood sugar regularly ° °• Do not drive while taking narcotics (pain medications) ° °• Do not take acetaminophen (Tylenol) and Roxicet or Lortab Elixir at the same time since these pain medications contain acetaminophen °  °Diet:  °First 2 Weeks You will see the nutritionist about two (2) weeks after your surgery. The nutritionist will increase the types of  foods you can eat if you are handling liquids well: °• If you have severe vomiting or nausea and cannot handle clear liquids lasting longer than 1 day call your surgeon °Protein Shake °• Drink at least 2 ounces of shake 5-6 times per day °• Each serving of protein shakes (usually 8-12 ounces) should have a minimum of: °o 15 grams of protein °o And no more than 5 grams of carbohydrate °• Goal for protein each day: °o Men = 80 grams per day °o Women = 60 grams per day °  ° • Protein powder may be added to fluids such as non-fat milk or Lactaid milk or Soy milk (limit to 35 grams added protein powder per serving) ° °Hydration °• Slowly increase the amount of water and other clear liquids as tolerated (See Acceptable Fluids) °• Slowly increase the amount of protein shake as tolerated °• Sip fluids slowly and throughout the day °• May use sugar substitutes in small amounts (no more than 6-8 packets per day; i.e. Splenda) ° °Fluid Goal °• The first goal is to drink at least 8 ounces of protein shake/drink per day (or as directed by the nutritionist); some examples of protein shakes are Syntrax Nectar, Adkins Advantage, EAS Edge HP, and Unjury. - See handout from pre-op Bariatric Education Class: °o Slowly increase the amount of protein shake you drink as tolerated °o You may find it easier to slowly sip shakes throughout the day °o It is important to get your proteins in first °• Your fluid goal is to drink 64-100 ounces of fluid daily °o It may take a few weeks to build up to this  °• 32 oz. (or more) should be clear liquids °And °• 32 oz. (or more) should be full liquids (see below for examples) °• Liquids should not contain sugar, caffeine, or carbonation ° °Clear Liquids: °• Water of Sugar-free flavored water (i.e. Fruit H²O, Propel) °• Decaffeinated coffee or tea (sugar-free) °• Crystal lite, Wyler’s Lite, Minute Maid Lite °• Sugar-free Jell-O °• Bouillon or broth °• Sugar-free Popsicle:    - Less than 20 calories  each; Limit 1 per day ° °Full Liquids: °                  Protein Shakes/Drinks + 2 choices per day of other full liquids °• Full liquids must be: °o No More Than 12 grams of Carbs per serving °o No More Than 3 grams of Fat per serving °• Strained low-fat cream soup °• Non-Fat milk °• Fat-free Lactaid Milk °• Sugar-free yogurt (Dannon Lite & Fit, Greek yogurt) ° °  °Vitamins and Minerals • Start 1 day after surgery unless otherwise directed by your surgeon °• 2 Chewable Bariatric Multivitamin / Multimineral Supplement with iron °• Chewable Calcium Citrate with Vitamin D-3 °(Example: 3 Chewable Calcium  Plus 600 with Vitamin D-3) °o Take 500 mg three (3) times a day for a total of 1500 mg each day °o Do not take all 3 doses of calcium   at one time as it may cause constipation, and you can only absorb 500 mg at a time °o Do not mix multivitamins containing iron with calcium supplements;  take 2 hours apart °• Menstruating women and those at risk for anemia ( a blood disease that causes weakness) may need extra iron °o Talk to your doctor to see if you need more iron °• If you need extra iron: Total daily Iron recommendation (including Vitamins) is 50 to 100 mg Iron/day °• Do not stop taking or change any vitamins or minerals until you talk to your nutritionist or surgeon °• Your nutritionist and/or surgeon must approve all vitamin and mineral supplements °  °Activity and Exercise: It is important to continue walking at home. Limit your physical activity as instructed by your doctor. During this time, use these guidelines: °• Do not lift anything greater than ten  (10) pounds for at least two (2) weeks °• Do not go back to work or drive until your surgeon says you can °• You may have sex when you feel comfortable °o It is VERY important for male patients to use a reliable birth control method; fertility often increase after surgery °o Do not get pregnant for at least 18 months °• Start exercising as soon as your  doctor tells you that you can °o Make sure your doctor approves any physical activity °• Start with a simple walking program °• Walk 5-15 minutes each day, 7 days per week °• Slowly increase until you are walking 30-45 minutes per day °• Consider joining our BELT program. [phone] or email [email or web address] °  °Special Instructions Things to remember: °• Use your CPAP when sleeping if this applies to you °• Consider buying a medical alert bracelet that says you had lap-band surgery °  °  You will likely have your first fill (fluid added to your band) 6 - 8 weeks after surgery °• [facility] has a free Bariatric Surgery Support Group that meets monthly, the 3rd Thursday, 6pm.  Education Center Classrooms. You can see classes online at [email or web address] °• It is very important to keep all follow up appointments with your surgeon, nutritionist, primary care physician, and behavioral health practitioner °o After the first year, please follow up with your bariatric surgeon and nutritionist at least once a year in order to maintain best weight loss results °      °             [facility]:  [phone] ° °             [facility]: [phone] ° °             Bariatric Nurse Coordinator: [phone]  °Gastric Bypass/Sleeve Home Care Instructions  Rev. 11/2012    ° °                                                    Reviewed and Endorsed °                                                   by  Patient Education Committee, Jan, 2014 ° ° ° ° ° ° ° ° ° °

## 2017-08-01 LAB — CBC WITH DIFFERENTIAL/PLATELET
BASOS ABS: 0 10*3/uL (ref 0.0–0.1)
BASOS PCT: 0 %
EOS ABS: 0 10*3/uL (ref 0.0–0.7)
EOS PCT: 0 %
HCT: 40.4 % (ref 39.0–52.0)
Hemoglobin: 14.1 g/dL (ref 13.0–17.0)
Lymphocytes Relative: 9 %
Lymphs Abs: 1.1 10*3/uL (ref 0.7–4.0)
MCH: 32 pg (ref 26.0–34.0)
MCHC: 34.9 g/dL (ref 30.0–36.0)
MCV: 91.6 fL (ref 78.0–100.0)
MONO ABS: 0.8 10*3/uL (ref 0.1–1.0)
Monocytes Relative: 7 %
Neutro Abs: 10.5 10*3/uL — ABNORMAL HIGH (ref 1.7–7.7)
Neutrophils Relative %: 84 %
PLATELETS: 196 10*3/uL (ref 150–400)
RBC: 4.41 MIL/uL (ref 4.22–5.81)
RDW: 13 % (ref 11.5–15.5)
WBC: 12.4 10*3/uL — ABNORMAL HIGH (ref 4.0–10.5)

## 2017-08-01 LAB — GLUCOSE, CAPILLARY
GLUCOSE-CAPILLARY: 132 mg/dL — AB (ref 65–99)
GLUCOSE-CAPILLARY: 124 mg/dL — AB (ref 65–99)

## 2017-08-01 NOTE — Plan of Care (Signed)
Problem: Food- and Nutrition-Related Knowledge Deficit (NB-1.1) Goal: Nutrition education Formal process to instruct or train a patient/client in a skill or to impart knowledge to help patients/clients voluntarily manage or modify food choices and eating behavior to maintain or improve health. Outcome: Completed/Met Date Met: 08/01/17 Nutrition Education Note  Received consult for diet education per DROP protocol.   Discussed 2 week post op diet with pt. Emphasized that liquids must be non carbonated, non caffeinated, and sugar free. Fluid goals discussed. Pt to follow up with outpatient bariatric RD for further diet progression after 2 weeks. Multivitamins and minerals also reviewed. Teach back method used, pt expressed understanding, expect good compliance.   Diet: First 2 Weeks  You will see the nutritionist about two (2) weeks after your surgery. The nutritionist will increase the types of foods you can eat if you are handling liquids well:  If you have severe vomiting or nausea and cannot handle clear liquids lasting longer than 1 day, call your surgeon  Protein Shake  Drink at least 2 ounces of shake 5-6 times per day  Each serving of protein shakes (usually 8 - 12 ounces) should have a minimum of:  15 grams of protein  And no more than 5 grams of carbohydrate  Goal for protein each day:  Men = 80 grams per day  Women = 60 grams per day  Protein powder may be added to fluids such as non-fat milk or Lactaid milk or Soy milk (limit to 35 grams added protein powder per serving)   Hydration  Slowly increase the amount of water and other clear liquids as tolerated (See Acceptable Fluids)  Slowly increase the amount of protein shake as tolerated  Sip fluids slowly and throughout the day  May use sugar substitutes in small amounts (no more than 6 - 8 packets per day; i.e. Splenda)   Fluid Goal  The first goal is to drink at least 8 ounces of protein shake/drink per day (or as directed  by the nutritionist); some examples of protein shakes are Premier Protein, Syntrax Nectar, Adkins Advantage, EAS Edge HP, and Unjury. See handout from pre-op Bariatric Education Class:  Slowly increase the amount of protein shake you drink as tolerated  You may find it easier to slowly sip shakes throughout the day  It is important to get your proteins in first  Your fluid goal is to drink 64 - 100 ounces of fluid daily  It may take a few weeks to build up to this  32 oz (or more) should be clear liquids  And  32 oz (or more) should be full liquids (see below for examples)  Liquids should not contain sugar, caffeine, or carbonation   Clear Liquids:  Water or Sugar-free flavored water (i.e. Fruit H2O, Propel)  Decaffeinated coffee or tea (sugar-free)  Crystal Lite, Wyler's Lite, Minute Maid Lite  Sugar-free Jell-O  Bouillon or broth  Sugar-free Popsicle: *Less than 20 calories each; Limit 1 per day   Full Liquids:  Protein Shakes/Drinks + 2 choices per day of other full liquids  Full liquids must be:  No More Than 12 grams of Carbs per serving  No More Than 3 grams of Fat per serving  Strained low-fat cream soup  Non-Fat milk  Fat-free Lactaid Milk  Sugar-free yogurt (Dannon Lite & Fit, Greek yogurt, Oikos Zero)    , MS, RD, LDN [place] [facility] Clinical Dietitian Pager: [phone] After Hours Pager: [phone]     

## 2017-08-01 NOTE — Progress Notes (Signed)
Discharge and medication instructions reviewed with patient; questions answered and patient denies further questions. Spouse is here to drive patient home No prescriptions given to patient. Donne Hazel, RN

## 2017-08-01 NOTE — Discharge Summary (Signed)
Physician Discharge Summary  Patient ID:  Corey Bell  MRN: [id number]  DOB/AGE: 50-15-1967 50 y.o.  Admit date: 07/31/2017 Discharge date: 08/01/2017  Discharge Diagnoses:  1.  MORBID OBESITY (E66.01)  Weight - 293, BMI - 37.7  2. DIABETES MELLITUS TYPE 2 IN OBESE (E11.69)             Since 2013 3.  OBSTRUCTIVE SLEEP APNEA ON CPAP (G47.33) 4.  GALL STONES (K80.20)  4. DJD - back 5. Neurologic issues post back surgery - still has pain down left leg He had a L5-S1 disk by Dr. Rolena Infante in Feb 2012. Dr. Donnetta Hutching did the exposure through an anterior approach. 6. GERD 7. HTN x 15 years         He is seen by Dr. Aundra Dubin x 9 years. Dr. Aundra Dubin took care of his father. 8. history of diverticulosis 9. HIstory of kidney stones He has had prostate infection He has had Peyronie's disease Has seen Dr. Karsten Ro 10. hypothyroid  On synthroid since 2016 11. He is on chronic disability.  12. Pain meds - he takes Tramadol 4/7 days a week. He gets a presciption of hydrocodone (#30) and it last a year   Active Problems:   Morbid obesity ([place])  Operation: Procedure(s): LAPAROSCOPIC GASTRIC SLEEVE RESECTION WITH UPPER ENDOSCOPY on 07/31/2017 - D. Lucia Gaskins  Discharged Condition: good  Hospital Course: Corey Bell is an 51 y.o. male whose primary care physician is Lucille Passy, MD and who was admitted 07/31/2017 with a chief complaint of Morbid Obesity.   He was brought to the operating room on 07/31/2017 and underwent  LAPAROSCOPIC GASTRIC SLEEVE RESECTION WITH UPPER ENDOSCOPY.   He is now one day post op.  He is doing well.  He is taking liquids well. His wife is in the room with him.  He is ready for discharge. The discharge instructions were reviewed with the patient.  Consults: None  Significant Diagnostic Studies: Results for orders placed or performed during the hospital encounter of 07/31/17  Glucose, capillary  Result Value Ref Range   Glucose-Capillary 132 (H) 65 - 99 mg/dL  Glucose, capillary  Result Value Ref Range   Glucose-Capillary 121 (H) 65 - 99 mg/dL  Hemoglobin and hematocrit, blood  Result Value Ref Range   Hemoglobin 14.4 13.0 - 17.0 g/dL   HCT 41.6 39.0 - 52.0 %  Glucose, capillary  Result Value Ref Range   Glucose-Capillary 134 (H) 65 - 99 mg/dL  CBC WITH DIFFERENTIAL  Result Value Ref Range   WBC 12.4 (H) 4.0 - 10.5 K/uL   RBC 4.41 4.22 - 5.81 MIL/uL   Hemoglobin 14.1 13.0 - 17.0 g/dL   HCT 40.4 39.0 - 52.0 %   MCV 91.6 78.0 - 100.0 fL   MCH 32.0 26.0 - 34.0 pg   MCHC 34.9 30.0 - 36.0 g/dL   RDW 13.0 11.5 - 15.5 %   Platelets 196 150 - 400 K/uL   Neutrophils Relative % 84 %   Neutro Abs 10.5 (H) 1.7 - 7.7 K/uL   Lymphocytes Relative 9 %   Lymphs Abs 1.1 0.7 - 4.0 K/uL   Monocytes Relative 7 %   Monocytes Absolute 0.8 0.1 - 1.0 K/uL   Eosinophils Relative 0 %   Eosinophils Absolute 0.0 0.0 - 0.7 K/uL   Basophils Relative 0 %   Basophils Absolute 0.0 0.0 - 0.1 K/uL  Glucose, capillary  Result Value Ref Range   Glucose-Capillary 157 (H) 65 - 99  mg/dL  Glucose, capillary  Result Value Ref Range   Glucose-Capillary 146 (H) 65 - 99 mg/dL  Glucose, capillary  Result Value Ref Range   Glucose-Capillary 132 (H) 65 - 99 mg/dL  Glucose, capillary  Result Value Ref Range   Glucose-Capillary 124 (H) 65 - 99 mg/dL    No results found.  Discharge Exam:  Vitals:   08/01/17 0143 08/01/17 0500  BP: (!) 118/50 127/69  Pulse: 67 (!) 54  Resp: 18 18  Temp: 98.3 F (36.8 C) 98.5 F (36.9 C)  SpO2: 98% 95%    General: Obese WM who is alert and generally healthy appearing.  Lungs: Clear to auscultation and symmetric breath sounds. Heart:  RRR. No murmur or rub. Abdomen: Soft. No mass. Has bowel sounds. Incisions look good.  Discharge Medications:   Allergies as of 08/01/2017      Reactions   Augmentin [amoxicillin-pot Clavulanate] Diarrhea   Demerol [meperidine] Other (See Comments)    "goes crazy and has the strength of 10 men"   Eggs Or Egg-derived Products Diarrhea, Nausea And Vomiting   Sulfamethoxazole Other (See Comments)   Blisters form in mouth   Statins Rash   Sulfa Antibiotics Rash   Sulfacetamide Sodium Rash      Medication List    STOP taking these medications   aspirin 325 MG tablet     TAKE these medications   ASPERCREME EX Apply 1 application topically as needed (for pain).   buPROPion 300 MG 24 hr tablet Commonly known as:  WELLBUTRIN XL Take 1 tablet (300 mg total) by mouth daily.   cholecalciferol 1000 units tablet Commonly known as:  VITAMIN D Take 1,000 Units by mouth daily.   citalopram 40 MG tablet Commonly known as:  CELEXA Take 1 tablet (40 mg total) by mouth daily.   cyclobenzaprine 10 MG tablet Commonly known as:  FLEXERIL Take 1 tablet (10 mg total) by mouth at bedtime.   diphenhydrAMINE 25 mg capsule Commonly known as:  BENADRYL Take 50 mg by mouth at bedtime.   erythromycin with ethanol 2 % external solution Commonly known as:  THERAMYCIN APPLY TWICE A DAY What changed:  See the new instructions.   ezetimibe 10 MG tablet Commonly known as:  ZETIA Take 1 tablet (10 mg total) by mouth daily.   fexofenadine 180 MG tablet Commonly known as:  ALLEGRA Take 180 mg by mouth daily.   FREESTYLE LITE test strip Generic drug:  glucose blood TEST TWICE A DAY   gabapentin 800 MG tablet Commonly known as:  NEURONTIN Take 800 mg by mouth 4 (four) times daily.   glucose monitoring kit monitoring kit 1 each by Does not apply route as needed for other. Use as directed   ibuprofen 600 MG tablet Commonly known as:  ADVIL,MOTRIN Take 600 mg by mouth daily. Notes to patient:  Avoid NSAIDs for 6-8 weeks after surgery   levothyroxine 75 MCG tablet Commonly known as:  SYNTHROID, LEVOTHROID Take 1 tablet (75 mcg total) by mouth daily.   LIQUID TEARS OP Place 1 drop into both eyes as needed (for dry eyes).   lisinopril 10  MG tablet Commonly known as:  PRINIVIL,ZESTRIL Take 1 tablet (10 mg total) by mouth daily. Notes to patient:  Monitor Blood Pressure Daily and keep a log for primary care physician.  You may need to make changes to your medications with rapid weight loss.     metFORMIN 500 MG tablet Commonly known as:  GLUCOPHAGE TAKE 1  TABLET BY MOUTH EVERY DAY WITH BREAKFAST What changed:  how much to take  how to take this  when to take this  additional instructions   methocarbamol 500 MG tablet Commonly known as:  ROBAXIN Take 2 tablets (1,000 mg total) by mouth every 6 (six) hours as needed for muscle spasms.   metoprolol succinate 50 MG 24 hr tablet Commonly known as:  TOPROL-XL TAKE 1 TABLET BY MOUTH EVERY DAY What changed:  See the new instructions.   montelukast 10 MG tablet Commonly known as:  SINGULAIR TAKE ONE TABLET BY MOUTH EVERY NIGHT AT BEDTIME What changed:  See the new instructions.   ONE TOUCH LANCETS Misc Check blood sugar once daily and as directed. Dx. E11.9   oxyCODONE-acetaminophen 5-325 MG tablet Commonly known as:  PERCOCET/ROXICET Take 1 tablet by mouth every 6 (six) hours as needed for severe pain.   pantoprazole 40 MG tablet Commonly known as:  PROTONIX Take 40 mg by mouth daily.   ranitidine 150 MG tablet Commonly known as:  ZANTAC Take 150 mg by mouth daily.   traMADol 50 MG tablet Commonly known as:  ULTRAM TAKE 1 TABLET BY MOUTH EVERY 8 HOURS AS NEEDED FOR MODERATE TO SEVEREPAIN What changed:  See the new instructions.   vitamin B-12 1000 MCG tablet Commonly known as:  CYANOCOBALAMIN Take 1,000 mcg by mouth daily.       Disposition: 01-Home or Self Care  Discharge Instructions    Ambulate hourly while awake    Complete by:  As directed    Call MD for:  difficulty breathing, headache or visual disturbances    Complete by:  As directed    Call MD for:  persistant dizziness or light-headedness    Complete by:  As directed    Call MD  for:  persistant nausea and vomiting    Complete by:  As directed    Call MD for:  redness, tenderness, or signs of infection (pain, swelling, redness, odor or green/yellow discharge around incision site)    Complete by:  As directed    Call MD for:  severe uncontrolled pain    Complete by:  As directed    Call MD for:  temperature >101 F    Complete by:  As directed    Diet bariatric full liquid    Complete by:  As directed    Incentive spirometry    Complete by:  As directed    Perform hourly while awake      Follow-up Information    Alphonsa Overall, MD. Go on 08/23/2017.   Specialty:  General Surgery Why:  at [place] information: [address] [place] [id number] [phone]        Alphonsa Overall, MD Follow up.   Specialty:  General Surgery Contact information: [place] [id number] [phone]            Signed: Alphonsa Overall, M.D., [facility] Surgery Office:  [phone]  08/01/2017, 8:16 AM

## 2017-08-01 NOTE — Progress Notes (Signed)
Patient alert and oriented, Post op day 1.  Provided support and encouragement.  Encouraged pulmonary toilet, ambulation and small sips of liquids.  Completed 12 ounces of clear fluid and a protein shake.  All questions answered.  Will continue to monitor.

## 2017-08-03 ENCOUNTER — Telehealth (HOSPITAL_COMMUNITY): Payer: Self-pay

## 2017-08-03 NOTE — Telephone Encounter (Signed)
Follow up with bariatric surgical patient to discuss post discharge questions.  No answer at this time voicemail left for patient along with contact information to discuss the following questions.  1.  Are you having any pain not relieved by pain medication?No taking Tylenol  2.  How much fluid total fluid intake have you had in the last 24/48 hours?  52 total ounces of fluid  3.  How much protein intake have you had in the last 24/48 hours?30 grams of protein  4.  Have you had any trouble making urine?No problems with volume a little slow for stream to start  5.  Have you had nausea that has not been relieved by nausea medication?nonausea today, some yesterday but it passed  6.  Are you ambulating every hour?yes  7.  Are you passing gas or had a BM?No passing gas has taken some medication  8.  Do you know how to contact [place]? [facility]? [facility]?yes  9.  Are you taking your vitamins and calcium without difficulty?Yes but the multivitamins are nasty  10. Tell me how your incision looks?  Any redness, open incision, or drainage?they look good some bruising

## 2017-08-07 ENCOUNTER — Ambulatory Visit: Payer: Self-pay | Admitting: Family Medicine

## 2017-08-13 ENCOUNTER — Encounter: Payer: Self-pay | Admitting: Family Medicine

## 2017-08-13 ENCOUNTER — Ambulatory Visit: Payer: PPO | Admitting: Family Medicine

## 2017-08-13 DIAGNOSIS — E1122 Type 2 diabetes mellitus with diabetic chronic kidney disease: Secondary | ICD-10-CM

## 2017-08-13 DIAGNOSIS — I10 Essential (primary) hypertension: Secondary | ICD-10-CM | POA: Diagnosis not present

## 2017-08-13 DIAGNOSIS — Z9884 Bariatric surgery status: Secondary | ICD-10-CM | POA: Diagnosis not present

## 2017-08-13 MED ORDER — METOPROLOL SUCCINATE ER 25 MG PO TB24: 25.0000 mg | ORAL_TABLET | Freq: Every day | ORAL | 3 refills | Status: DC

## 2017-08-13 NOTE — Patient Instructions (Signed)
Great to see you.  PLEASE do NOT restart metformin.  STOP taking Toprol XL 50 mg daily and start taking Toprol XL 25 mg daily.  Follow up with me in 1 month.

## 2017-08-13 NOTE — Progress Notes (Signed)
Subjective:   Patient ID: Corey Bell, male    DOB: 02-03-1966, 51 y.o.   MRN: [id number]  Corey Bell is a pleasant 51 y.o. year old male who presents to clinic today with Follow-up (Patient is here today to F/U bariatric surgery.  On 10.23.2018 had Gastric Sleve procedure completed.)  on 08/13/2017  HPI:  Gastric sleeve on 07/31/17- doing well.  Still on liquids.  Has follow up with nutritionist tomorrow.  DM- Stopped taking Metformin on Thursday. Brings in FSBS fasting- running 96-104.  Lab Results  Component Value Date   HGBA1C 6.1 (H) 07/27/2017    HTN- has been dizzy when standing from a seated position.  On lisinopril 10 mg daily and Toprol XL 50 mg daily.  Current Outpatient Medications on File Prior to Visit  Medication Sig Dispense Refill  . buPROPion (WELLBUTRIN XL) 300 MG 24 hr tablet Take 1 tablet (300 mg total) by mouth daily. 30 tablet 6  . cholecalciferol (VITAMIN D) 1000 units tablet Take 1,000 Units by mouth daily.    . citalopram (CELEXA) 40 MG tablet Take 1 tablet (40 mg total) by mouth daily. 30 tablet 6  . cyclobenzaprine (FLEXERIL) 10 MG tablet Take 1 tablet (10 mg total) by mouth at bedtime. 30 tablet 3  . diphenhydrAMINE (BENADRYL) 25 mg capsule Take 50 mg by mouth at bedtime.    Marland Kitchen erythromycin with ethanol (THERAMYCIN) 2 % external solution APPLY TWICE A DAY (Patient taking differently: Apply to affected area once daily) 60 mL 2  . ezetimibe (ZETIA) 10 MG tablet Take 1 tablet (10 mg total) by mouth daily. 90 tablet 3  . fexofenadine (ALLEGRA) 180 MG tablet Take 180 mg by mouth daily.    Marland Kitchen FREESTYLE LITE test strip TEST TWICE A DAY 100 each 11  . gabapentin (NEURONTIN) 800 MG tablet Take 800 mg by mouth 4 (four) times daily.     Marland Kitchen glucose monitoring kit (FREESTYLE) monitoring kit 1 each by Does not apply route as needed for other. Use as directed 1 each 0  . ibuprofen (ADVIL,MOTRIN) 600 MG tablet Take 600 mg by mouth daily.     Marland Kitchen levothyroxine  (SYNTHROID, LEVOTHROID) 75 MCG tablet Take 1 tablet (75 mcg total) by mouth daily. 30 tablet 6  . lisinopril (PRINIVIL,ZESTRIL) 10 MG tablet Take 1 tablet (10 mg total) by mouth daily. 30 tablet 6  . metFORMIN (GLUCOPHAGE) 500 MG tablet TAKE 1 TABLET BY MOUTH EVERY DAY WITH BREAKFAST (Patient taking differently: Take 500 mg by mouth daily with breakfast. ) 30 tablet 6  . methocarbamol (ROBAXIN) 500 MG tablet Take 2 tablets (1,000 mg total) by mouth every 6 (six) hours as needed for muscle spasms. 60 tablet 3  . metoprolol succinate (TOPROL-XL) 50 MG 24 hr tablet TAKE 1 TABLET BY MOUTH EVERY DAY (Patient taking differently: Take 50 mg by mouth every day) 90 tablet 3  . montelukast (SINGULAIR) 10 MG tablet TAKE ONE TABLET BY MOUTH EVERY NIGHT AT BEDTIME (Patient taking differently: Take 10 mg by mouth at bedtime) 90 tablet 3  . ONE TOUCH LANCETS MISC Check blood sugar once daily and as directed. Dx. E11.9 200 each 1  . oxyCODONE-acetaminophen (PERCOCET/ROXICET) 5-325 MG tablet Take 1 tablet by mouth every 6 (six) hours as needed for severe pain. 30 tablet 0  . pantoprazole (PROTONIX) 40 MG tablet Take 40 mg by mouth daily.    . Polyvinyl Alcohol (LIQUID TEARS OP) Place 1 drop into both eyes as needed (  for dry eyes).    . traMADol (ULTRAM) 50 MG tablet TAKE 1 TABLET BY MOUTH EVERY 8 HOURS AS NEEDED FOR MODERATE TO SEVEREPAIN (Patient taking differently: TAKE 50 MG BY MOUTH EVERY 8 HOURS AS NEEDED FOR MODERATE TO SEVEREPAIN) 30 tablet 0  . Trolamine Salicylate (ASPERCREME EX) Apply 1 application topically as needed (for pain).    . vitamin B-12 (CYANOCOBALAMIN) 1000 MCG tablet Take 1,000 mcg by mouth daily.    . ranitidine (ZANTAC) 150 MG tablet Take 150 mg by mouth daily.      No current facility-administered medications on file prior to visit.     Allergies  Allergen Reactions  . Augmentin [Amoxicillin-Pot Clavulanate] Diarrhea  . Demerol [Meperidine] Other (See Comments)    "goes crazy and has  the strength of 10 men"  . Eggs Or Egg-Derived Products Diarrhea and Nausea And Vomiting  . Sulfamethoxazole Other (See Comments)    Blisters form in mouth  . Statins Rash  . Sulfa Antibiotics Rash  . Sulfacetamide Sodium Rash    Past Medical History:  Diagnosis Date  . Anxiety   . Aphthous ulcer 10-04-12   improving  . Carpal tunnel syndrome, bilateral    damaged nerve in neck  . Chronic pain syndrome   . COPD (chronic obstructive pulmonary disease) (H[facility])    mild  . Depression   . Diabetes mellitus without complication ([place])    type 2  . Diverticulitis of colon   . Esophageal reflux   . H/O acute prostatitis   . Headache(784.0)    hx of migraines  . Hypothyroidism   . Insomnia, unspecified   . Mixed hyperlipidemia   . Peyronie disease 12-20-12   PER uROLOGY  . Seasonal allergies   . Sleep apnea    uses cpap  . Tympanic membrane perforation    history of  . Unspecified essential hypertension     Past Surgical History:  Procedure Laterality Date  . LumbarSacral Disc Surgery     L5-S1  . MENISCUS REPAIR Left    torn meniscus  . TYMPANOSTOMY TUBE PLACEMENT  1975    Family History  Problem Relation Age of Onset  . Congestive Heart Failure Father   . Heart disease Father   . COPD Father   . Renal cancer Paternal Grandfather        renal call carcinoma  . Heart disease Brother        stents at 93 yo  . Bladder Cancer Paternal Uncle   . Renal cancer Paternal Uncle        renal cell carcinoma  . Heart disease Other   . Parkinsonism Neg Hx     Social History   Socioeconomic History  . Marital status: Married    Spouse name: [name]  . Number of children: 0  . Years of education: college  . Highest education level: Not on file  Social Needs  . Financial resource strain: Not on file  . Food insecurity - worry: Not on file  . Food insecurity - inability: Not on file  . Transportation needs - medical: Not on file  . Transportation needs - non-medical: Not  on file  Occupational History  . 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 Smoking status: Former Smoker    Packs/day: 1.50    Years: 30.00    Pack years: 45.00    Types: Cigarettes    Last attempt to quit:  10/10/2007    Years since quitting: 9.8  . Smokeless tobacco: Never Used  Substance and Sexual Activity  . Alcohol use: Yes    Alcohol/week: 0.6 oz    Types: 1 Cans of beer per week    Comment: rarely  . Drug use: No  . Sexual activity: Yes  Other Topics Concern  . Not on file  Social History Narrative   Married.  [name])  No children. Patient has Best boy.   In process of getting disability for low back pain.   Right handed.   Caffeine- Coffee a pot but not every day.   The PMH, PSH, Social History, Family History, Medications, and allergies have been reviewed in [facility], and have been updated if relevant.   Review of Systems  Constitutional: Negative.   Respiratory: Negative.   Cardiovascular: Negative.   Gastrointestinal: Negative.   Neurological: Positive for dizziness. Negative for tremors, seizures, syncope, facial asymmetry, speech difficulty, weakness, light-headedness, numbness and headaches.  All other systems reviewed and are negative.      Objective:    BP 124/66 (BP Location: Right Arm, Patient Position: Sitting, Cuff Size: Large)   Pulse 60   Temp 98 F (36.7 C) (Oral)   Ht _0  (1.88 m)   Wt 278 lb 6.4 oz (126.3 kg)   SpO2 98%   BMI 35.74 kg/m   Wt Readings from Last 3 Encounters:  08/13/17 278 lb 6.4 oz (126.3 kg)  08/01/17 289 lb (131.1 kg)  07/27/17 292 lb (132.5 kg)     Physical Exam  General:  pleasant male in no acute distress Eyes:  PERRL Ears:  External ear exam shows no significant lesions or deformities.  TMs normal bilaterally Hearing is grossly normal bilaterally. Nose:  External nasal examination shows no deformity or inflammation. Nasal mucosa are pink and moist without  lesions or exudates. Mouth:  Oral mucosa and oropharynx without lesions or exudates.  Teeth in good repair. Neck:  no carotid bruit or thyromegaly no cervical or supraclavicular lymphadenopathy  Lungs:  Normal respiratory effort, chest expands symmetrically. Lungs are clear to auscultation, no crackles or wheezes. Heart:  Normal rate and regular rhythm. S1 and S2 normal without gallop, murmur, click, rub or other extra sounds. Abdomen:  Bowel sounds positive,abdomen soft and non-tender without masses, organomegaly or hernias noted. Pulses:  R and L posterior tibial pulses are full and equal bilaterally  Extremities:  no edema  Psych:  Good eye contact, not anxious or depressed appearing       Assessment & Plan:   Morbid obesity ([place])  Type 2 diabetes mellitus with chronic kidney disease, without long-term current use of insulin, unspecified CKD stage ([place])  S/P bariatric surgery No Follow-up on file.

## 2017-08-13 NOTE — Assessment & Plan Note (Signed)
Agree with stopping Metformin. Advised not to restart it. Continue check FSBS daily.

## 2017-08-13 NOTE — Assessment & Plan Note (Signed)
Now with weight loss, showing signs and symptoms of orthostasis. Decrease Toprol XL to 25 mg daily. Follow up in 1 month.

## 2017-08-13 NOTE — Assessment & Plan Note (Signed)
Doing well, keep follow up appt with nutritionist to advance diet.

## 2017-08-14 ENCOUNTER — Ambulatory Visit: Payer: Self-pay | Admitting: Registered"

## 2017-08-14 ENCOUNTER — Telehealth: Payer: Self-pay | Admitting: Registered"

## 2017-08-14 NOTE — Telephone Encounter (Signed)
Good morning Mr. Corey Bell,  I hope you're doing well. Ok, thank you for letting me know ahead of time that you are unable come in today. Our receptionist will contact you to reschedule your appointment. We are unable to have appointments by phone. I look forward to seeing you soon. Have a great day!  Thank you, Isidore Moos, RD, [place] Registered [place] [facility] Direct Dial: [phone]  Fax: [phone] Website: [email or web address]    From: Rolland Bimler [email or web address]  Sent: Tuesday, August 14, 2017 7:00 AM To: Tyrone Nine, Allisen Pidgeon [email or web address]> Subject: [External Email]Appointment  *Caution - External Email* Good morning,  I am not going to be able to make my appointment today. Is there any way we could do it by phone? Possibly earlier? Could you email any written material? I went to my PCP yesterday,  and my weight was 278 lb.  Thank you  [name] [phone]

## 2017-08-15 ENCOUNTER — Encounter: Payer: PPO | Attending: Surgery | Admitting: Registered"

## 2017-08-15 DIAGNOSIS — Z713 Dietary counseling and surveillance: Secondary | ICD-10-CM | POA: Insufficient documentation

## 2017-08-15 DIAGNOSIS — E669 Obesity, unspecified: Secondary | ICD-10-CM

## 2017-08-15 NOTE — Progress Notes (Signed)
Bariatric Class:  Appt start time: 8:30 end time:  9:15  2 Week Post-Operative Nutrition Class  Patient was seen on 08/15/2017 for Post-[facility]ative Nutrition [facility]   Surgery date: Sleeve gastrectomy Surgery type: 07/31/2017 Start weight at [facility]: 289.9 Weight today: 276.0 Weight change: 13.9 lbs loss  Pt states he has had some changes to vision. Pt states he was having some lightheadedness then stopped taking blood pressure medications. Pt states he had some diarrhea with Premier Protein shakes a day after taking half of a laxative. Pt states he has had a chicken biscuit from [employer] as well as some deli meat. Pt states he checks BS 1x/day: FBS (96-104). Pt states he does well with taking MVI, but sometimes forgets to take 3 calcium supplements.   TANITA  BODY COMP RESULTS  08/15/2017   BMI (kg/m^2) 35.4   Fat Mass (lbs) 114.2   Fat Free Mass (lbs) 161.8   Total Body Water (lbs) 121.8   The following the learning objectives were met by the patient during this course:  Identifies Phase 3A (Soft, High Proteins) Dietary Goals and will begin from 2 weeks post-operatively to 2 months post-operatively  Identifies appropriate sources of fluids and proteins   States protein recommendations and appropriate sources post-operatively  Identifies the need for appropriate texture modifications, mastication, and bite sizes when consuming solids  Identifies appropriate multivitamin and calcium sources post-operatively  Describes the need for physical activity post-operatively and will follow MD recommendations  States when to call healthcare provider regarding medication questions or post-operative complications  Handouts given during class include:  Phase 3A: Soft, High Protein Diet Handout  Follow-Up Plan: Patient will follow-up at [facility] in 6 weeks for 2 month post-op nutrition visit for diet advancement per MD.

## 2017-08-16 ENCOUNTER — Other Ambulatory Visit: Payer: Self-pay | Admitting: Family Medicine

## 2017-08-22 ENCOUNTER — Telehealth: Payer: Self-pay | Admitting: Registered"

## 2017-08-22 NOTE — Telephone Encounter (Signed)
Pt is 3 weeks post-op bariatric surgery and called to see if it was ok to have salad. Pt states he is meeting his fluid and protein goals.   RD reminds pt non-starchy vegetables will be introduced at next diet phase at his next appointment 2 months post-op.

## 2017-08-24 ENCOUNTER — Encounter: Payer: Self-pay | Admitting: Family Medicine

## 2017-08-24 ENCOUNTER — Ambulatory Visit: Payer: PPO | Admitting: Family Medicine

## 2017-08-24 VITALS — BP 110/66 | HR 67 | Temp 98.1°F | Wt 276.5 lb

## 2017-08-24 DIAGNOSIS — B9789 Other viral agents as the cause of diseases classified elsewhere: Secondary | ICD-10-CM

## 2017-08-24 DIAGNOSIS — J329 Chronic sinusitis, unspecified: Secondary | ICD-10-CM

## 2017-08-24 MED ORDER — PREDNISONE 20 MG PO TABS: 20.0000 mg | ORAL_TABLET | Freq: Every day | ORAL | 0 refills | Status: DC

## 2017-08-24 NOTE — Progress Notes (Signed)
Subjective:    Patient ID: Corey Bell, male    DOB: [birth year], 51 y.o.   MRN: [id number]  HPI This is a 51 yo male who presents today with pain under eyes for 3 days. Has been taking afrin and sudafed x 3 days with some relief. Taking benadryl at bedtime. Alternating nasal drainage, clear, with congestion. Low grade fever, no muscle aches. No cough, wheeze or SOB. No known sick contacts.   Past Medical History:  Diagnosis Date  . Anxiety   . Aphthous ulcer 10-04-12   improving  . Carpal tunnel syndrome, bilateral    damaged nerve in neck  . Chronic pain syndrome   . COPD (chronic obstructive pulmonary disease) (HCC)    mild  . Depression   . Diabetes mellitus without complication ([place])    type 2  . Diverticulitis of colon   . Esophageal reflux   . H/O acute prostatitis   . Headache(784.0)    hx of migraines  . Hypothyroidism   . Insomnia, unspecified   . Mixed hyperlipidemia   . Peyronie disease 12-20-12   PER uROLOGY  . Seasonal allergies   . Sleep apnea    uses cpap  . Tympanic membrane perforation    history of  . Unspecified essential hypertension    Past Surgical History:  Procedure Laterality Date  . 16 DOT PLACTATION N/A 08/22/2013   Performed by Kathie Rhodes, MD at [facility]  . LAPAROSCOPIC GASTRIC SLEEVE RESECTION WITH UPPER ENDOSCOPY N/A 07/31/2017   Performed by Alphonsa Overall, MD at [facility] ORS  . LumbarSacral Disc Surgery     L5-S1  . MENISCUS REPAIR Left    torn meniscus  . TYMPANOSTOMY TUBE PLACEMENT  1975  . VASECTOMY Bilateral 08/22/2013   Performed by Kathie Rhodes, MD at [facility]   Family History  Problem Relation Age of Onset  . Congestive Heart Failure Father   . Heart disease Father   . COPD Father   . Renal cancer Paternal Grandfather        renal call carcinoma  . Heart disease Brother        stents at 56 yo  . Bladder Cancer Paternal Uncle   . Renal cancer Paternal Uncle        renal cell  carcinoma  . Heart disease Other   . Parkinsonism Neg Hx    Social History   Tobacco Use  . Smoking status: Former Smoker    Packs/day: 1.50    Years: 30.00    Pack years: 45.00    Types: Cigarettes    Last attempt to quit: 10/10/2007    Years since quitting: 9.8  . Smokeless tobacco: Never Used  Substance Use Topics  . Alcohol use: Yes    Alcohol/week: 0.6 oz    Types: 1 Cans of beer per week    Comment: rarely  . Drug use: No      Review of Systems Per HPI    Objective:   Physical Exam  Constitutional: He is oriented to person, place, and time. He appears well-developed and well-nourished. No distress.  HENT:  Head: Normocephalic and atraumatic.  Right Ear: Tympanic membrane, external ear and ear canal normal.  Left Ear: Tympanic membrane, external ear and ear canal normal.  Nose: Mucosal edema and rhinorrhea present. Right sinus exhibits maxillary sinus tenderness. Right sinus exhibits no frontal sinus tenderness. Left sinus exhibits maxillary sinus tenderness. Left sinus exhibits no frontal sinus tenderness.  Mouth/Throat: Uvula is midline, oropharynx is clear and moist and mucous membranes are normal.  Eyes: Conjunctivae are normal.  Neck: Normal range of motion. Neck supple.  Cardiovascular: Normal rate, regular rhythm and normal heart sounds.  Lymphadenopathy:    He has no cervical adenopathy.  Neurological: He is alert and oriented to person, place, and time.  Skin: Skin is warm and dry. He is not diaphoretic.  Psychiatric: He has a normal mood and affect. His behavior is normal. Judgment and thought content normal.  Vitals reviewed.     BP 110/66 (BP Location: Right Arm, Patient Position: Sitting, Cuff Size: Large)   Pulse 67   Temp 98.1 F (36.7 C) (Oral)   Wt 276 lb 8 oz (125.4 kg)   SpO2 98%   BMI 35.50 kg/m  Wt Readings from Last 3 Encounters:  08/24/17 276 lb 8 oz (125.4 kg)  08/15/17 276 lb (125.2 kg)  08/13/17 278 lb 6.4 oz (126.3 kg)        Assessment & Plan:  1. Viral sinusitis - Provided written and verbal information regarding diagnosis and treatment. - given severity of congestion on decongestant and Afrin, will do short course of prednisone - RTC precautions reviewed - predniSONE (DELTASONE) 20 MG tablet; Take 1 tablet (20 mg total) daily with breakfast by mouth.  Dispense: 5 tablet; Refill: 0- discussed potential side effects   Clarene Reamer, FNP-BC   Primary Care at [facility]  08/26/2017 5:12 PM

## 2017-08-24 NOTE — Patient Instructions (Signed)

## 2017-08-26 ENCOUNTER — Encounter: Payer: Self-pay | Admitting: Family Medicine

## 2017-08-29 ENCOUNTER — Encounter: Payer: Self-pay | Admitting: Family Medicine

## 2017-09-06 ENCOUNTER — Other Ambulatory Visit: Payer: Self-pay | Admitting: Family Medicine

## 2017-09-12 ENCOUNTER — Ambulatory Visit: Payer: Self-pay | Admitting: Family Medicine

## 2017-09-13 ENCOUNTER — Ambulatory Visit: Payer: PPO | Admitting: Family Medicine

## 2017-09-13 ENCOUNTER — Encounter: Payer: Self-pay | Admitting: Family Medicine

## 2017-09-13 VITALS — BP 116/82 | HR 83 | Temp 98.7°F | Ht 74.0 in | Wt 259.4 lb

## 2017-09-13 DIAGNOSIS — E1122 Type 2 diabetes mellitus with diabetic chronic kidney disease: Secondary | ICD-10-CM | POA: Diagnosis not present

## 2017-09-13 DIAGNOSIS — I10 Essential (primary) hypertension: Secondary | ICD-10-CM

## 2017-09-13 DIAGNOSIS — Z9884 Bariatric surgery status: Secondary | ICD-10-CM | POA: Diagnosis not present

## 2017-09-13 MED ORDER — GABAPENTIN 800 MG PO TABS: 800.0000 mg | ORAL_TABLET | Freq: Four times a day (QID) | ORAL | 0 refills | Status: DC

## 2017-09-13 MED ORDER — LISINOPRIL 2.5 MG PO TABS: 2.5000 mg | ORAL_TABLET | Freq: Every day | ORAL | 3 refills | Status: DC

## 2017-09-13 NOTE — Assessment & Plan Note (Signed)
Doing well and continues to lose weight.

## 2017-09-13 NOTE — Progress Notes (Signed)
Subjective:   Patient ID: Corey Bell, male    DOB: [birth year], 51 y.o.   MRN: [id number]  Corey Bell is a pleasant 51 y.o. year old male who presents to clinic today with Follow-up (Patient is here today to F/U with medications as he has had a gastric sleve and to be sure medications are still appropriate.)  on 09/13/2017  HPI:  Gastric sleeve on 07/31/17- doing well.  Has advanced his diet with the help of his nutritionist and is doing well.    DM- Stopped taking Metformin.  Still checking his FSBS Brings in FSBS fasting- running 80s- low 100s. Lab Results  Component Value Date   HGBA1C 6.1 (H) 07/27/2017    HTN- still has been dizzy when standing from a seated position.  On lisinopril 10 mg daily and Toprol XL 25  mg daily.(we decreased his toprol to 25 mg daily from 50 mg daily at his last OV).  Current Outpatient Medications on File Prior to Visit  Medication Sig Dispense Refill  . buPROPion (WELLBUTRIN XL) 300 MG 24 hr tablet TAKE 1 TABLET BY MOUTH EVERY DAY 30 tablet 1  . Cholecalciferol (VITAMIN D3) 5000 units TABS Take 1 tablet by mouth daily.    . citalopram (CELEXA) 40 MG tablet Take 1 tablet (40 mg total) by mouth daily. 30 tablet 6  . cyclobenzaprine (FLEXERIL) 10 MG tablet Take 1 tablet (10 mg total) by mouth at bedtime. 30 tablet 3  . diphenhydrAMINE (BENADRYL) 25 mg capsule Take 50 mg by mouth at bedtime.    Marland Kitchen erythromycin with ethanol (THERAMYCIN) 2 % external solution APPLY TWICE A DAY (Patient taking differently: Apply to affected area once daily) 60 mL 2  . ezetimibe (ZETIA) 10 MG tablet Take 1 tablet (10 mg total) by mouth daily. 90 tablet 3  . fexofenadine (ALLEGRA) 180 MG tablet Take 180 mg by mouth daily.    Marland Kitchen FREESTYLE LITE test strip TEST TWICE A DAY 100 each 11  . glucose monitoring kit (FREESTYLE) monitoring kit 1 each by Does not apply route as needed for other. Use as directed 1 each 0  . ibuprofen (ADVIL,MOTRIN) 600 MG tablet Take 600 mg by  mouth daily.     Marland Kitchen levothyroxine (SYNTHROID, LEVOTHROID) 75 MCG tablet TAKE 1 TABLET BY MOUTH EVERY DAY 30 tablet 1  . methocarbamol (ROBAXIN) 500 MG tablet Take 2 tablets (1,000 mg total) by mouth every 6 (six) hours as needed for muscle spasms. 60 tablet 3  . metoprolol succinate (TOPROL XL) 25 MG 24 hr tablet Take 1 tablet (25 mg total) daily by mouth. 30 tablet 3  . montelukast (SINGULAIR) 10 MG tablet TAKE ONE TABLET BY MOUTH EVERY NIGHT AT BEDTIME (Patient taking differently: Take 10 mg by mouth at bedtime) 90 tablet 3  . ONE TOUCH LANCETS MISC Check blood sugar once daily and as directed. Dx. E11.9 200 each 1  . oxyCODONE-acetaminophen (PERCOCET/ROXICET) 5-325 MG tablet Take 1 tablet by mouth every 6 (six) hours as needed for severe pain. 30 tablet 0  . pantoprazole (PROTONIX) 40 MG tablet Take 40 mg by mouth daily.    . Polyvinyl Alcohol (LIQUID TEARS OP) Place 1 drop into both eyes as needed (for dry eyes).    . predniSONE (DELTASONE) 20 MG tablet Take 1 tablet (20 mg total) daily with breakfast by mouth. 5 tablet 0  . traMADol (ULTRAM) 50 MG tablet TAKE 1 TABLET BY MOUTH EVERY 8 HOURS AS NEEDED FOR MODERATE TO  SEVEREPAIN (Patient taking differently: TAKE 50 MG BY MOUTH EVERY 8 HOURS AS NEEDED FOR MODERATE TO SEVEREPAIN) 30 tablet 0  . Trolamine Salicylate (ASPERCREME EX) Apply 1 application topically as needed (for pain).    . vitamin B-12 (CYANOCOBALAMIN) 1000 MCG tablet Take 1 tablet by mouth daily.     No current facility-administered medications on file prior to visit.     Allergies  Allergen Reactions  . Augmentin [Amoxicillin-Pot Clavulanate] Diarrhea  . Demerol [Meperidine] Other (See Comments)    "goes crazy and has the strength of 10 men"  . Eggs Or Egg-Derived Products Diarrhea and Nausea And Vomiting  . Sulfamethoxazole Other (See Comments)    Blisters form in mouth  . Statins Rash  . Sulfa Antibiotics Rash  . Sulfacetamide Sodium Rash    Past Medical History:    Diagnosis Date  . Anxiety   . Aphthous ulcer 10-04-12   improving  . Carpal tunnel syndrome, bilateral    damaged nerve in neck  . Chronic pain syndrome   . COPD (chronic obstructive pulmonary disease) (HCC)    mild  . Depression   . Diabetes mellitus without complication ([place])    type 2  . Diverticulitis of colon   . Esophageal reflux   . H/O acute prostatitis   . Headache(784.0)    hx of migraines  . Hypothyroidism   . Insomnia, unspecified   . Mixed hyperlipidemia   . Peyronie disease 12-20-12   PER uROLOGY  . Seasonal allergies   . Sleep apnea    uses cpap  . Tympanic membrane perforation    history of  . Unspecified essential hypertension     Past Surgical History:  Procedure Laterality Date  . LAPAROSCOPIC GASTRIC SLEEVE RESECTION N/A 07/31/2017   Procedure: LAPAROSCOPIC GASTRIC SLEEVE RESECTION WITH UPPER ENDOSCOPY;  Surgeon: Alphonsa Overall, MD;  Location: [facility];  Service: General;  Laterality: N/A;  . LumbarSacral Disc Surgery     L5-S1  . MENISCUS REPAIR Left    torn meniscus  . NESBIT PROCEDURE N/A 08/22/2013   Procedure: 16 DOT PLACTATION;  Surgeon: Claybon Jabs, MD;  Location: [facility];  Service: Urology;  Laterality: N/A;  . TYMPANOSTOMY TUBE PLACEMENT  1975  . VASECTOMY Bilateral 08/22/2013   Procedure: VASECTOMY;  Surgeon: Claybon Jabs, MD;  Location: [facility];  Service: Urology;  Laterality: Bilateral;    Family History  Problem Relation Age of Onset  . Congestive Heart Failure Father   . Heart disease Father   . COPD Father   . Renal cancer Paternal Grandfather        renal call carcinoma  . Heart disease Brother        stents at 4 yo  . Bladder Cancer Paternal Uncle   . Renal cancer Paternal Uncle        renal cell carcinoma  . Heart disease Other   . Parkinsonism Neg Hx     Social History   Socioeconomic History  . Marital status: Married    Spouse name: Corey Bell  . Number of children: 0  .  Years of education: college  . Highest education level: Not on file  Social Needs  . Financial resource strain: Not on file  . Food insecurity - worry: Not on file  . Food insecurity - inability: Not on file  . Transportation needs - medical: Not on file  . Transportation needs - non-medical: Not on file  Occupational History  .  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 Smoking status: Former Smoker    Packs/day: 1.50    Years: 30.00    Pack years: 45.00    Types: Cigarettes    Last attempt to quit: 10/10/2007    Years since quitting: 9.9  . Smokeless tobacco: Never Used  Substance and Sexual Activity  . Alcohol use: Yes    Alcohol/week: 0.6 oz    Types: 1 Cans of beer per week    Comment: rarely  . Drug use: No  . Sexual activity: Yes  Other Topics Concern  . Not on file  Social History Narrative   Married.  Corey Bell)  No children. Patient has Best boy.   In process of getting disability for low back pain.   Right handed.   Caffeine- Coffee a pot but not every day.   The PMH, PSH, Social History, Family History, Medications, and allergies have been reviewed in [facility], and have been updated if relevant.   Review of Systems  Constitutional: Negative.   Respiratory: Negative.   Cardiovascular: Negative.   Gastrointestinal: Negative.   Neurological: Positive for dizziness. Negative for tremors, seizures, syncope, facial asymmetry, speech difficulty, weakness, light-headedness, numbness and headaches.  All other systems reviewed and are negative.      Objective:    BP 116/82 (BP Location: Left Arm, Patient Position: Sitting, Cuff Size: Normal)   Pulse 83   Temp 98.7 F (37.1 C) (Oral)   Ht 6' 2" (1.88 m)   Wt 259 lb 6.4 oz (117.7 kg)   SpO2 98%   BMI 33.30 kg/m   Wt Readings from Last 3 Encounters:  09/13/17 259 lb 6.4 oz (117.7 kg)  08/24/17 276 lb 8 oz (125.4 kg)  08/15/17 276 lb (125.2 kg)     Physical  Exam  Constitutional: He is oriented to person, place, and time. He appears well-developed and well-nourished. No distress.  HENT:  Head: Normocephalic and atraumatic.  Eyes: Conjunctivae are normal.  Cardiovascular: Normal rate and regular rhythm.  Pulmonary/Chest: Effort normal and breath sounds normal.  Musculoskeletal: Normal range of motion. He exhibits no edema.  Neurological: He is alert and oriented to person, place, and time. No cranial nerve deficit.  Skin: Skin is warm and dry. He is not diaphoretic.  Psychiatric: He has a normal mood and affect. His behavior is normal. Judgment and thought content normal.  Nursing note and vitals reviewed.        Assessment & Plan:   Type 2 diabetes mellitus with chronic kidney disease, without long-term current use of insulin, unspecified CKD stage (HCC)  S/P bariatric surgery  Essential hypertension No Follow-up on file.

## 2017-09-13 NOTE — Assessment & Plan Note (Signed)
Continues to have symptoms of orthostasis with continued weight loss. Will change lisinopril from 10 mg daily to 2.5 mg daily for renal protection.  Continue current dose of Toprol.  Keep checking BP at home. Call or return to clinic prn if these symptoms worsen or fail to improve as anticipated. The patient indicates understanding of these issues and agrees with the plan.

## 2017-09-13 NOTE — Patient Instructions (Addendum)
Great to see you.  STOP taking lisinopril 10 mg daily, start Lisinopril 2.5 mg daily. Keep an eye on your blood pressure for me.  Happy Holidays!

## 2017-09-13 NOTE — Assessment & Plan Note (Signed)
Doing well off rxs.

## 2017-09-20 ENCOUNTER — Ambulatory Visit: Payer: PPO | Admitting: Family Medicine

## 2017-09-20 ENCOUNTER — Ambulatory Visit (HOSPITAL_BASED_OUTPATIENT_CLINIC_OR_DEPARTMENT_OTHER)
Admission: RE | Admit: 2017-09-20 | Discharge: 2017-09-20 | Disposition: A | Discharge status: Home / Self Care | Payer: PPO | Source: Ambulatory Visit | Attending: Family Medicine | Admitting: Family Medicine

## 2017-09-20 ENCOUNTER — Ambulatory Visit: Payer: Self-pay | Admitting: Family Medicine

## 2017-09-20 VITALS — BP 120/78 | HR 62 | Temp 98.6°F | Ht 74.0 in | Wt 242.4 lb

## 2017-09-20 DIAGNOSIS — M899 Disorder of bone, unspecified: Secondary | ICD-10-CM | POA: Insufficient documentation

## 2017-09-20 DIAGNOSIS — M898X8 Other specified disorders of bone, other site: Secondary | ICD-10-CM

## 2017-09-20 DIAGNOSIS — R222 Localized swelling, mass and lump, trunk: Secondary | ICD-10-CM | POA: Diagnosis not present

## 2017-09-20 NOTE — Progress Notes (Signed)
Subjective:   Patient ID: Corey Bell, male    DOB: [birth year], 51 y.o.   MRN: [id number]  Corey Bell is a pleasant 51 y.o. year old male who presents to clinic today with Cyst (pt. reports a knot below the center of breast bone; minimal discomfort, but no pain; first notice the presence of the knot a week ago)  on 09/20/2017  HPI:  Knot below his breast bone- first noticed this a week ago. Minimal discomfort but not painful.  It's right above one of his surgical scars from recent bariatric surgery-he has already lost over 40 pounds.  Wt Readings from Last 3 Encounters:  09/20/17 242 lb 6.4 oz (110 kg)  09/13/17 259 lb 6.4 oz (117.7 kg)  08/24/17 276 lb 8 oz (125.4 kg)   Knot is a little discomfort if you press hard on it but no pain with deep inspiration of valsalva. No redness or warmth.  Current Outpatient Medications on File Prior to Visit  Medication Sig Dispense Refill  . buPROPion (WELLBUTRIN XL) 300 MG 24 hr tablet TAKE 1 TABLET BY MOUTH EVERY DAY 30 tablet 1  . Cholecalciferol (VITAMIN D3) 5000 units TABS Take 1 tablet by mouth daily.    . citalopram (CELEXA) 40 MG tablet Take 1 tablet (40 mg total) by mouth daily. 30 tablet 6  . cyclobenzaprine (FLEXERIL) 10 MG tablet Take 1 tablet (10 mg total) by mouth at bedtime. 30 tablet 3  . diphenhydrAMINE (BENADRYL) 25 mg capsule Take 50 mg by mouth at bedtime.    Marland Kitchen erythromycin with ethanol (THERAMYCIN) 2 % external solution APPLY TWICE A DAY (Patient taking differently: Apply to affected area once daily) 60 mL 2  . ezetimibe (ZETIA) 10 MG tablet Take 1 tablet (10 mg total) by mouth daily. 90 tablet 3  . fexofenadine (ALLEGRA) 180 MG tablet Take 180 mg by mouth daily.    Marland Kitchen FREESTYLE LITE test strip TEST TWICE A DAY 100 each 11  . gabapentin (NEURONTIN) 800 MG tablet Take 1 tablet (800 mg total) by mouth 4 (four) times daily. 360 tablet 0  . glucose monitoring kit (FREESTYLE) monitoring kit 1 each by Does not apply route  as needed for other. Use as directed 1 each 0  . ibuprofen (ADVIL,MOTRIN) 600 MG tablet Take 600 mg by mouth daily.     Marland Kitchen levothyroxine (SYNTHROID, LEVOTHROID) 75 MCG tablet TAKE 1 TABLET BY MOUTH EVERY DAY 30 tablet 1  . lisinopril (ZESTRIL) 2.5 MG tablet Take 1 tablet (2.5 mg total) by mouth daily. 30 tablet 3  . methocarbamol (ROBAXIN) 500 MG tablet Take 2 tablets (1,000 mg total) by mouth every 6 (six) hours as needed for muscle spasms. 60 tablet 3  . metoprolol succinate (TOPROL XL) 25 MG 24 hr tablet Take 1 tablet (25 mg total) daily by mouth. 30 tablet 3  . montelukast (SINGULAIR) 10 MG tablet TAKE ONE TABLET BY MOUTH EVERY NIGHT AT BEDTIME (Patient taking differently: Take 10 mg by mouth at bedtime) 90 tablet 3  . ONE TOUCH LANCETS MISC Check blood sugar once daily and as directed. Dx. E11.9 200 each 1  . oxyCODONE-acetaminophen (PERCOCET/ROXICET) 5-325 MG tablet Take 1 tablet by mouth every 6 (six) hours as needed for severe pain. 30 tablet 0  . pantoprazole (PROTONIX) 40 MG tablet Take 40 mg by mouth daily.    . Polyvinyl Alcohol (LIQUID TEARS OP) Place 1 drop into both eyes as needed (for dry eyes).    Marland Kitchen  traMADol (ULTRAM) 50 MG tablet TAKE 1 TABLET BY MOUTH EVERY 8 HOURS AS NEEDED FOR MODERATE TO SEVEREPAIN (Patient taking differently: TAKE 50 MG BY MOUTH EVERY 8 HOURS AS NEEDED FOR MODERATE TO SEVEREPAIN) 30 tablet 0  . Trolamine Salicylate (ASPERCREME EX) Apply 1 application topically as needed (for pain).    . vitamin B-12 (CYANOCOBALAMIN) 1000 MCG tablet Take 1 tablet by mouth daily.     No current facility-administered medications on file prior to visit.     Allergies  Allergen Reactions  . Augmentin [Amoxicillin-Pot Clavulanate] Diarrhea  . Demerol [Meperidine] Other (See Comments)    "goes crazy and has the strength of 10 men"  . Eggs Or Egg-Derived Products Diarrhea and Nausea And Vomiting  . Sulfamethoxazole Other (See Comments)    Blisters form in mouth  . Statins Rash    . Sulfa Antibiotics Rash  . Sulfacetamide Sodium Rash    Past Medical History:  Diagnosis Date  . Anxiety   . Aphthous ulcer 10-04-12   improving  . Carpal tunnel syndrome, bilateral    damaged nerve in neck  . Chronic pain syndrome   . COPD (chronic obstructive pulmonary disease) (HCC)    mild  . Depression   . Diabetes mellitus without complication ([place])    type 2  . Diverticulitis of colon   . Esophageal reflux   . H/O acute prostatitis   . Headache(784.0)    hx of migraines  . Hypothyroidism   . Insomnia, unspecified   . Mixed hyperlipidemia   . Peyronie disease 12-20-12   PER uROLOGY  . Seasonal allergies   . Sleep apnea    uses cpap  . Tympanic membrane perforation    history of  . Unspecified essential hypertension     Past Surgical History:  Procedure Laterality Date  . LAPAROSCOPIC GASTRIC SLEEVE RESECTION N/A 07/31/2017   Procedure: LAPAROSCOPIC GASTRIC SLEEVE RESECTION WITH UPPER ENDOSCOPY;  Surgeon: Alphonsa Overall, MD;  Location: [facility];  Service: General;  Laterality: N/A;  . LumbarSacral Disc Surgery     L5-S1  . MENISCUS REPAIR Left    torn meniscus  . NESBIT PROCEDURE N/A 08/22/2013   Procedure: 16 DOT PLACTATION;  Surgeon: Claybon Jabs, MD;  Location: [facility];  Service: Urology;  Laterality: N/A;  . TYMPANOSTOMY TUBE PLACEMENT  1975  . VASECTOMY Bilateral 08/22/2013   Procedure: VASECTOMY;  Surgeon: Claybon Jabs, MD;  Location: [facility];  Service: Urology;  Laterality: Bilateral;    Family History  Problem Relation Age of Onset  . Congestive Heart Failure Father   . Heart disease Father   . COPD Father   . Renal cancer Paternal Grandfather        renal call carcinoma  . Heart disease Brother        stents at 71 yo  . Bladder Cancer Paternal Uncle   . Renal cancer Paternal Uncle        renal cell carcinoma  . Heart disease Other   . Parkinsonism Neg Hx     Social History   Socioeconomic  History  . Marital status: Married    Spouse name: [name]  . Number of children: 0  . Years of education: college  . Highest education level: Not on file  Social Needs  . Financial resource strain: Not on file  . Food insecurity - worry: Not on file  . Food insecurity - inability: Not on file  . Transportation needs -  medical: Not on file  . Transportation needs - non-medical: Not on file  Occupational History  . 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 Smoking status: Former Smoker    Packs/day: 1.50    Years: 30.00    Pack years: 45.00    Types: Cigarettes    Last attempt to quit: 10/10/2007    Years since quitting: 9.9  . Smokeless tobacco: Never Used  Substance and Sexual Activity  . Alcohol use: Yes    Alcohol/week: 0.6 oz    Types: 1 Cans of beer per week    Comment: rarely  . Drug use: No  . Sexual activity: Yes  Other Topics Concern  . Not on file  Social History Narrative   Married.  [name])  No children. Patient has Best boy.   In process of getting disability for low back pain.   Right handed.   Caffeine- Coffee a pot but not every day.   The PMH, PSH, Social History, Family History, Medications, and allergies have been reviewed in [facility], and have been updated if relevant.   Review of Systems  Respiratory: Negative.   Cardiovascular: Negative.   Musculoskeletal: Negative.   All other systems reviewed and are negative.      Objective:    BP 120/78 (BP Location: Left Arm, Cuff Size: Large)   Pulse 62   Temp 98.6 F (37 C) (Oral)   Ht _0  (1.88 m)   Wt 242 lb 6.4 oz (110 kg)   SpO2 98%   BMI 31.12 kg/m    Physical Exam  Constitutional: He appears well-developed and well-nourished. No distress.  HENT:  Head: Normocephalic and atraumatic.  Eyes: Conjunctivae are normal.  Cardiovascular: Normal rate.  Pulmonary/Chest: Effort normal. He exhibits mass.    Abdominal: Soft.   Musculoskeletal: Normal range of motion.  Neurological: He is alert. No cranial nerve deficit.  Skin: Skin is warm and dry. He is not diaphoretic.  Psychiatric: He has a normal mood and affect. His behavior is normal. Judgment and thought content normal.  Nursing note and vitals reviewed.         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

## 2017-09-20 NOTE — Assessment & Plan Note (Signed)
Likely normal anatomy that is now more prominent with weight loss. Will get KUB for further evaluation. The patient indicates understanding of these issues and agrees with the plan.

## 2017-09-20 NOTE — Patient Instructions (Signed)
Great to see you. Happy Holidays and Happy birthday.  Please stop by to see [facility] on your way out.

## 2017-09-26 ENCOUNTER — Encounter: Payer: Self-pay | Admitting: Registered"

## 2017-09-26 ENCOUNTER — Encounter: Payer: PPO | Attending: Surgery | Admitting: Registered"

## 2017-09-26 DIAGNOSIS — Z713 Dietary counseling and surveillance: Secondary | ICD-10-CM | POA: Insufficient documentation

## 2017-09-26 DIAGNOSIS — E119 Type 2 diabetes mellitus without complications: Secondary | ICD-10-CM

## 2017-09-26 NOTE — Progress Notes (Signed)
Follow-up visit:  8 Weeks Post-Operative Sleeve gastrectomy Surgery  Medical Nutrition Therapy:  Appt start time: 8:00 end time:  8:40.  Primary concerns today: Post-operative Bariatric Surgery Nutrition Management.  Non scale victories: clothes are getting bigger, from 3XL to 2XL in shirts, sleeping better, not wanting to sleep as much  Surgery date: 07/31/2017 Surgery type: Sleeve gastrectomy Start weight at [facility]: 289.9 lbs Weight today: 261.9 lbs Weight change: 15 lbs loss from 276.0 on 08/15/2017 Total weight loss: 28 lbs loss  Weight loss goal: ~230-235 lbs because his wife likes him at that weight  TANITA  BODY COMP RESULTS  08/15/2017 09/26/2017   BMI (kg/m^2) 35.4 Pt declined   Fat Mass (lbs) 114.2    Fat Free Mass (lbs) 161.8    Total Body Water (lbs) 121.8     Pt states he wanted vegetables for Thanksgiving and spoke with his surgeon about adjusting his diet to includes green beans, mashed potatoes, and [place]. Pt states he can't afford taking bariatric multivitamins; reports they are expensive at the pharmacy. Pt states his PCP told him he does not need to take bariatric vitamins. Pt states he checks BS 1-2x/day: (95-105). Pt states he is only taking thaimin, Vitamin D, iron, Vitamin B12, and calcium. Pt states surgeon wants him to walk an hour an day. Pt says its not going to happen. Pt states he plans to increase physical activity but has other things to do before then like preparing his garden for spring and doing other household chores. Pt states he has gotten to the point where he does not like to go out places. Pt states he will check with insurance and see what is covered and will call back for follow-up appts. Pt was encouraged to at least come back and check-in around 6 months post-op and one year post-op. Pt is not taking bariatric vitamins or any over-the-counter multivitamin. Pt states he didn't take a multivitamin before having surgery either. Pt states he will have  labs done within a month.    Preferred Learning Style:   No preference indicated   Learning Readiness:   Not ready  Contemplating  Ready  Change in progress  24-hr recall: B (AM): greek yogurt (15g) Snk (AM): glass of milk (8g) L (PM): chili or beans (14g) Snk (PM): ham and cheese (21g)  D (PM): 2 oz chicken (14g), spoonfuls of vegetables Snk (PM): sometimes yogurt (4g)  Fluid intake: water, Natures Twist, iced tea, milk, 6 cups of caffeinated coffee; 64+ oz Estimated total protein intake: ~76 grams  Medications: No longer taking metformin, decreased BP meds Supplementation: Thiamin, Vit D, iron, Vitamin B12 + 3 TUMS  CBG monitoring: 1-2x/day Average CBG per patient: 95-105 Last patient reported A1c: 6.1-6.2  Using straws: NO Drinking while eating: a little Having you been chewing well: yes Chewing/swallowing difficulties: no Changes in vision: no Changes to mood/headaches: no Hair loss/Changes to skin/Changes to nails: no, no, no Any difficulty focusing or concentrating: improved Sweating: no Dizziness/Lightheaded: at times-decreased blood pressure meds Palpitations: no Carbonated beverages: no N/V/D/C/GAS: a few times, no, no, no, yes Abdominal Pain: no Dumping syndrome: no Last Lap-Band fill: N/A  Recent physical activity:  Walking 15 min, 2-3x/week  Progress Towards Goal(s):  In progress.  Handouts given during visit include:  Phase 3B: High protein + NS vegetables   Nutritional Diagnosis:  Balm-3.3 Overweight/obesity related to past poor dietary habits and physical inactivity as evidenced by patient w/ recent sleeve gastrectomy surgery following dietary guidelines  for continued weight loss.     Intervention:  Nutrition education and counseling.  Goals:  Follow Phase 3B: High Protein + Non-Starchy Vegetables  Eat 3-6 small meals/snacks, every 3-5 hrs  Increase lean protein foods to meet 80g goal  Increase fluid intake to 64oz +  Avoid  drinking 15 minutes before, during and 30 minutes after eating  Aim for >30 min of physical activity daily - Aim to get bariatric-specific multivitamins and take along calcium supplements.   Teaching Method Utilized:  Visual Auditory Hands on  Barriers to learning/adherence to lifestyle change: pre-contemplative stage of change  Demonstrated degree of understanding via:  Teach Back   Monitoring/Evaluation:  Dietary intake, exercise, lap band fills, and body weight. Follow up in prn for bariatric post-up visit per pt's request.

## 2017-09-26 NOTE — Patient Instructions (Signed)
Goals:  Follow Phase 3B: High Protein + Non-Starchy Vegetables  Eat 3-6 small meals/snacks, every 3-5 hrs  Increase lean protein foods to meet 80g goal  Increase fluid intake to 64oz +  Avoid drinking 15 minutes before, during and 30 minutes after eating  Aim for >30 min of physical activity daily  - Aim to get bariatric-specific multivitamins and take along calcium supplements.

## 2017-10-10 ENCOUNTER — Other Ambulatory Visit: Payer: Self-pay | Admitting: Family Medicine

## 2017-10-14 NOTE — Progress Notes (Signed)
Cardiology Office Note  Date:  10/16/2017   ID:  Corey Bell, DOB 04-03-1966, MRN [id number]  PCP:  Lucille Passy, MD   Chief Complaint  Patient presents with  . Other     LS 2016  has been seen by Dr. Aundra Dubin. Patient denies chest pain and SOB. Meds reviewed verbally with patient.     HPI:  Last office visit with cardiology 09/23/2015 Mr. Corey Bell is a 52 yo male with h/o  COPD,  OSA, morbid obesity hypothyroidism on synthroid,  HTN,  hyperlipidemia with strong family history of CAD.  smoke 2 ppd but quit smoking in 2012.  Who presents for follow-up of his shortness of breath, coronary disease risk factors  brother with an MI at 60 another brother with PCI at 35. Father also had CAD.  normal Lexiscan Cardiolite in 9/14  echo showing normal EF and mild LVH.   History of palpitations.  Found to have PVCs on Holter   He denies associated CP, no significant dyspnea, dizziness, syncope/ near syncope.   bariatric surgery- los 30 pounds.07/31/2017 No longer following diabetic diet Sugars running in the 90s  Metoprolol dose decreased 50 to 25 last week for low blood pressure  CT ABD 2015 reviewed with him in detail: no PAD/aortic atherosclerosis  Overall feels well with no complaints EKG personally reviewed by myself on todays visit Shows sinus bradycardia rate 53 bpm no significant ST or T wave changes  PMH:   has a past medical history of Anxiety, Aphthous ulcer (10-04-12), Carpal tunnel syndrome, bilateral, Chronic pain syndrome, COPD (chronic obstructive pulmonary disease) ([place]), Depression, Diabetes mellitus without complication ([place]), Diverticulitis of colon, Esophageal reflux, H/O acute prostatitis, Headache(784.0), Hypothyroidism, Insomnia, unspecified, Mixed hyperlipidemia, Peyronie disease (12-20-12), Seasonal allergies, Sleep apnea, Tympanic membrane perforation, and Unspecified essential hypertension.  PSH:    Past Surgical History:  Procedure Laterality Date   . LAPAROSCOPIC GASTRIC SLEEVE RESECTION N/A 07/31/2017   Procedure: LAPAROSCOPIC GASTRIC SLEEVE RESECTION WITH UPPER ENDOSCOPY;  Surgeon: Alphonsa Overall, MD;  Location: [facility];  Service: General;  Laterality: N/A;  . LumbarSacral Disc Surgery     L5-S1  . MENISCUS REPAIR Left    torn meniscus  . NESBIT PROCEDURE N/A 08/22/2013   Procedure: 16 DOT PLACTATION;  Surgeon: Claybon Jabs, MD;  Location: [facility];  Service: Urology;  Laterality: N/A;  . TYMPANOSTOMY TUBE PLACEMENT  1975  . VASECTOMY Bilateral 08/22/2013   Procedure: VASECTOMY;  Surgeon: Claybon Jabs, MD;  Location: [facility];  Service: Urology;  Laterality: Bilateral;    Current Outpatient Medications  Medication Sig Dispense Refill  . buPROPion (WELLBUTRIN XL) 300 MG 24 hr tablet TAKE 1 TABLET BY MOUTH EVERY DAY 30 tablet 1  . Cholecalciferol (VITAMIN D3) 5000 units TABS Take 1 tablet by mouth daily.    . citalopram (CELEXA) 40 MG tablet Take 1 tablet (40 mg total) by mouth daily. 30 tablet 6  . cyclobenzaprine (FLEXERIL) 10 MG tablet Take 1 tablet (10 mg total) by mouth at bedtime. 30 tablet 3  . diphenhydrAMINE (BENADRYL) 25 mg capsule Take 50 mg by mouth at bedtime.    Marland Kitchen erythromycin with ethanol (THERAMYCIN) 2 % external solution APPLY TWICE A DAY (Patient taking differently: Apply to affected area once daily) 60 mL 2  . ezetimibe (ZETIA) 10 MG tablet Take 1 tablet (10 mg total) by mouth daily. 90 tablet 3  . fexofenadine (ALLEGRA) 180 MG tablet Take 180 mg by mouth daily.    Marland Kitchen  FREESTYLE LITE test strip TEST TWICE A DAY 100 each 11  . gabapentin (NEURONTIN) 800 MG tablet Take 1 tablet (800 mg total) by mouth 4 (four) times daily. 360 tablet 0  . glucose monitoring kit (FREESTYLE) monitoring kit 1 each by Does not apply route as needed for other. Use as directed 1 each 0  . ibuprofen (ADVIL,MOTRIN) 600 MG tablet Take 600 mg by mouth daily.     Marland Kitchen levothyroxine (SYNTHROID, LEVOTHROID) 75  MCG tablet TAKE 1 TABLET BY MOUTH EVERY DAY 30 tablet 1  . lisinopril (ZESTRIL) 2.5 MG tablet Take 1 tablet (2.5 mg total) by mouth daily. 30 tablet 3  . methocarbamol (ROBAXIN) 500 MG tablet Take 2 tablets (1,000 mg total) by mouth every 6 (six) hours as needed for muscle spasms. 60 tablet 3  . metoprolol succinate (TOPROL XL) 25 MG 24 hr tablet Take 1 tablet (25 mg total) daily by mouth. 30 tablet 3  . montelukast (SINGULAIR) 10 MG tablet TAKE ONE TABLET BY MOUTH EVERY NIGHT AT BEDTIME (Patient taking differently: Take 10 mg by mouth at bedtime) 90 tablet 3  . ONE TOUCH LANCETS MISC Check blood sugar once daily and as directed. Dx. E11.9 200 each 1  . oxyCODONE-acetaminophen (PERCOCET/ROXICET) 5-325 MG tablet Take 1 tablet by mouth every 6 (six) hours as needed for severe pain. 30 tablet 0  . pantoprazole (PROTONIX) 40 MG tablet Take 40 mg by mouth daily.    . Polyvinyl Alcohol (LIQUID TEARS OP) Place 1 drop into both eyes as needed (for dry eyes).    . traMADol (ULTRAM) 50 MG tablet TAKE 1 TABLET BY MOUTH EVERY 8 HOURS AS NEEDED FOR MODERATE TO SEVEREPAIN 30 tablet 2  . Trolamine Salicylate (ASPERCREME EX) Apply 1 application topically as needed (for pain).    . vitamin B-12 (CYANOCOBALAMIN) 1000 MCG tablet Take 1 tablet by mouth daily.     No current facility-administered medications for this visit.      Allergies:   Augmentin [amoxicillin-pot clavulanate]; Demerol [meperidine]; Eggs or egg-derived products; Sulfamethoxazole; Statins; Sulfa antibiotics; and Sulfacetamide sodium   Social History:  The patient  reports that he quit smoking about 10 years ago. His smoking use included cigarettes. He has a 45.00 pack-year smoking history. he has never used smokeless tobacco. He reports that he drinks about 0.6 oz of alcohol per week. He reports that he does not use drugs.   Family History:   family history includes Bladder Cancer in his paternal uncle; COPD in his father; Congestive Heart  Failure in his father; Heart disease in his brother, father, and other; Renal cancer in his paternal grandfather and paternal uncle.    Review of Systems: Review of Systems  Constitutional: Negative.   Respiratory: Negative.   Cardiovascular: Negative.   Gastrointestinal: Negative.   Musculoskeletal: Negative.   Neurological: Negative.   Psychiatric/Behavioral: Negative.   All other systems reviewed and are negative.    PHYSICAL EXAM: VS:  BP 132/81 (BP Location: Left Arm, Patient Position: Sitting, Cuff Size: Normal)   Pulse (!) 53   Ht 6' 2"  (1.88 m)   Wt 261 lb 8 oz (118.6 kg)   BMI 33.57 kg/m  , BMI Body mass index is 33.57 kg/m. GEN: Well nourished, well developed, in no acute distress,  obese HEENT: normal  Neck: no JVD, carotid bruits, or masses Cardiac: RRR; no murmurs, rubs, or gallops,no edema  Respiratory:  clear to auscultation bilaterally, normal work of breathing GI: soft, nontender, nondistended, +  BS MS: no deformity or atrophy  Skin: warm and dry, no rash Neuro:  Strength and sensation are intact Psych: euthymic mood, full affect    Recent Labs: 01/18/2017: TSH 1.56 07/11/2017: ALT 69; BUN 16; Creatinine, Ser 1.12; Potassium 4.9; Sodium 138 08/01/2017: Hemoglobin 14.1; Platelets 196    Lipid Panel Lab Results  Component Value Date   CHOL 162 07/11/2017   HDL 32.30 (L) 07/11/2017   LDLCALC 96 07/11/2017   TRIG 166.0 (H) 07/11/2017      Wt Readings from Last 3 Encounters:  10/16/17 261 lb 8 oz (118.6 kg)  09/26/17 261 lb 14.4 oz (118.8 kg)  09/20/17 242 lb 6.4 oz (110 kg)       ASSESSMENT AND PLAN:  Diabetes mellitus, new onset ([place]) - Plan: EKG 12-Lead Sugars running normal without any medications Reports he is no longer following diabetic diet  Morbid obesity ([place]) - Plan: EKG 12-Lead Recent bariatric surgery, 30 pound weight loss Recommend he increase his exercise program for additional weight loss  Exertional dyspnea - Plan: EKG  12-Lead Breathing improved with weight loss No clinical signs of heart failure  Mixed hyperlipidemia - Plan: EKG 12-Lead We will continue Zetia for now If numbers may drop with rapid weight loss Will discuss with him again in follow-up whether he needs to stay on Zetia Previous CT scan 2015 with no aortic or iliac atherosclerosis  Essential hypertension - Plan: EKG 12-Lead Blood pressure dropping with weight loss Also with asymptomatic bradycardia May need to drop metoprolol down to 12.5 mg daily He will play with a dosing depending on how he feels  CAFL (chronic airflow limitation) ([place]) - Plan: EKG 12-Lead Breathing has improved with weight loss  S/P bariatric surgery Will followed by [place] surgery Lab work pending  Disposition:   F/U  12 months as needed   Total encounter time more than 25 minutes  Greater than 50% was spent in counseling and coordination of care with the patient    Orders Placed This Encounter  Procedures  . EKG 12-Lead     Signed, Esmond Plants, M.D., Ph.D. 10/16/2017  [place], [place]

## 2017-10-16 ENCOUNTER — Ambulatory Visit (INDEPENDENT_AMBULATORY_CARE_PROVIDER_SITE_OTHER): Payer: PPO | Admitting: Cardiovascular Disease

## 2017-10-16 ENCOUNTER — Encounter: Payer: Self-pay | Admitting: Cardiovascular Disease

## 2017-10-16 VITALS — BP 132/81 | HR 53 | Ht 74.0 in | Wt 261.5 lb

## 2017-10-16 DIAGNOSIS — I10 Essential (primary) hypertension: Secondary | ICD-10-CM

## 2017-10-16 DIAGNOSIS — E782 Mixed hyperlipidemia: Secondary | ICD-10-CM | POA: Diagnosis not present

## 2017-10-16 DIAGNOSIS — E1169 Type 2 diabetes mellitus with other specified complication: Secondary | ICD-10-CM | POA: Diagnosis not present

## 2017-10-16 DIAGNOSIS — R0609 Other forms of dyspnea: Secondary | ICD-10-CM

## 2017-10-16 DIAGNOSIS — Z9884 Bariatric surgery status: Secondary | ICD-10-CM

## 2017-10-16 DIAGNOSIS — E119 Type 2 diabetes mellitus without complications: Secondary | ICD-10-CM

## 2017-10-16 DIAGNOSIS — J449 Chronic obstructive pulmonary disease, unspecified: Secondary | ICD-10-CM

## 2017-10-16 NOTE — Patient Instructions (Addendum)

## 2017-10-17 ENCOUNTER — Other Ambulatory Visit: Payer: Self-pay | Admitting: Family Medicine

## 2017-10-19 LAB — HEPATIC FUNCTION PANEL
ALT: 30 (ref 10–40)
AST: 27 (ref 14–40)
Alkaline Phosphatase: 60 (ref 25–125)
BILIRUBIN, TOTAL: 1.4

## 2017-10-19 LAB — BASIC METABOLIC PANEL
BUN: 17 (ref 4–21)
Creatinine: 1.3 (ref 0.6–1.3)
GLUCOSE: 91
POTASSIUM: 4.5 (ref 3.4–5.3)
SODIUM: 140 (ref 137–147)

## 2017-10-19 LAB — CBC AND DIFFERENTIAL
HEMATOCRIT: 43 (ref 41–53)
Hemoglobin: 15 (ref 13.5–17.5)
Neutrophils Absolute: 2
Platelets: 195 (ref 150–399)
WBC: 4.7

## 2017-10-19 LAB — VITAMIN B12: Vitamin B-12: 1835

## 2017-10-19 LAB — HEMOGLOBIN A1C: Hemoglobin A1C: 5.8

## 2017-10-25 ENCOUNTER — Other Ambulatory Visit: Payer: Self-pay | Admitting: Family Medicine

## 2017-11-05 ENCOUNTER — Other Ambulatory Visit: Payer: Self-pay | Admitting: Family Medicine

## 2017-11-05 MED ORDER — LEVOTHYROXINE SODIUM 75 MCG PO TABS: 75.0000 ug | ORAL_TABLET | Freq: Every day | ORAL | 3 refills | Status: DC

## 2017-11-14 DIAGNOSIS — T148XXA Other injury of unspecified body region, initial encounter: Secondary | ICD-10-CM | POA: Diagnosis not present

## 2017-11-26 ENCOUNTER — Encounter: Payer: Self-pay | Admitting: Family Medicine

## 2017-12-13 ENCOUNTER — Encounter: Payer: Self-pay | Admitting: Family Medicine

## 2017-12-13 ENCOUNTER — Ambulatory Visit (INDEPENDENT_AMBULATORY_CARE_PROVIDER_SITE_OTHER): Payer: PPO | Admitting: Family Medicine

## 2017-12-13 VITALS — BP 114/64 | HR 63 | Temp 98.8°F | Ht 74.0 in | Wt 252.0 lb

## 2017-12-13 DIAGNOSIS — Z23 Encounter for immunization: Secondary | ICD-10-CM | POA: Diagnosis not present

## 2017-12-13 DIAGNOSIS — E1122 Type 2 diabetes mellitus with diabetic chronic kidney disease: Secondary | ICD-10-CM | POA: Diagnosis not present

## 2017-12-13 DIAGNOSIS — Z9884 Bariatric surgery status: Secondary | ICD-10-CM | POA: Diagnosis not present

## 2017-12-13 DIAGNOSIS — I10 Essential (primary) hypertension: Secondary | ICD-10-CM

## 2017-12-13 DIAGNOSIS — E038 Other specified hypothyroidism: Secondary | ICD-10-CM | POA: Diagnosis not present

## 2017-12-13 DIAGNOSIS — E782 Mixed hyperlipidemia: Secondary | ICD-10-CM | POA: Diagnosis not present

## 2017-12-13 DIAGNOSIS — R3911 Hesitancy of micturition: Secondary | ICD-10-CM | POA: Insufficient documentation

## 2017-12-13 LAB — POCT URINALYSIS DIPSTICK
BILIRUBIN UA: NEGATIVE
Glucose, UA: NEGATIVE
KETONES UA: NEGATIVE
Leukocytes, UA: NEGATIVE
NITRITE UA: NEGATIVE
PH UA: 6 (ref 5.0–8.0)
PROTEIN UA: NEGATIVE
RBC UA: NEGATIVE
Spec Grav, UA: 1.025 (ref 1.010–1.025)
UROBILINOGEN UA: 0.2 U/dL

## 2017-12-13 LAB — TSH: TSH: 1.5 u[IU]/mL (ref 0.35–4.50)

## 2017-12-13 LAB — POCT GLYCOSYLATED HEMOGLOBIN (HGB A1C): Hemoglobin A1C: 6

## 2017-12-13 MED ORDER — PANTOPRAZOLE SODIUM 40 MG PO TBEC: 40.0000 mg | DELAYED_RELEASE_TABLET | Freq: Every day | ORAL | 1 refills | Status: DC

## 2017-12-13 NOTE — Assessment & Plan Note (Signed)
POC a1c today is 6.0 Diet controlled now with weight loss.

## 2017-12-13 NOTE — Assessment & Plan Note (Signed)
Continue current dose of synthroid. Check TSH today.

## 2017-12-13 NOTE — Progress Notes (Signed)
Subjective:   Patient ID: Corey Bell, male    DOB: 1966/04/29, 52 y.o.   MRN: [id number]  Corey Bell is a pleasant 52 y.o. year old male who presents to clinic today with Diabetes (Patient is here today for a 44-month-F/U DM.  He was advised to stop taking his Metformin so we will see what his A1C is.  He has lost some weight.  FYI: Dr. Lucia Gaskins will be doing labs in April.) and Urinary Hesitancy (Patient is also C/O a new Sx of urinary hesitancy.  He states that this has been going on for a couple of weeks.  He stands there to urinate and will take a while to get started and on occas it will just be a dribble. Denies any dysuria and does not feel like past prostatitis Dx.)  on 12/13/2017  HPI:  Gastric sleeve on 07/31/17- doing well.   Has been doing well, continues to lose weight. Wt Readings from Last 3 Encounters:  12/13/17 252 lb (114.3 kg)  10/16/17 261 lb 8 oz (118.6 kg)  09/26/17 261 lb 14.4 oz (118.8 kg)     DM- Stopped taking Metformin.  Still checking his FSBS Brings in FSBS fasting- running 80s- low 100s.   HTN-  with continued weight loss, was experiencing symptoms of orthostasis so we have been weaning down his bp medications.  Currently taking lisinopril 2.5 mg daily and Metoprolol 12.5 mg daily.  Dizzy spells have resolved.  Saw his cardiologist, Dr. Rockey Situ on 10/16/17. Note reviewed. Advised to continue Zetia.  Hypothyroidism- currently taking synthroid 75 mcg daily. Denies any symptoms of hypo or hyperthyroidism. Lab Results  Component Value Date   TSH 1.56 01/18/2017   Urinary hesitancy- Ongoing for a couple of weeks.  Feels symptoms are different than when he had prostatitis. Denies dysuria.  Current Outpatient Medications on File Prior to Visit  Medication Sig Dispense Refill  . buPROPion (WELLBUTRIN XL) 300 MG 24 hr tablet TAKE 1 TABLET BY MOUTH DAILY 30 tablet 2  . Cholecalciferol (VITAMIN D3) 5000 units TABS Take 1 tablet by mouth daily.    .  citalopram (CELEXA) 40 MG tablet TAKE 1 TABLET BY MOUTH EVERY DAY 30 tablet 2  . cyclobenzaprine (FLEXERIL) 10 MG tablet Take 1 tablet (10 mg total) by mouth at bedtime. 30 tablet 3  . diphenhydrAMINE (BENADRYL) 25 mg capsule Take 50 mg by mouth at bedtime.    Marland Kitchen erythromycin with ethanol (THERAMYCIN) 2 % external solution APPLY TWICE A DAY (Patient taking differently: Apply to affected area once daily) 60 mL 2  . ezetimibe (ZETIA) 10 MG tablet Take 1 tablet (10 mg total) by mouth daily. 90 tablet 3  . fexofenadine (ALLEGRA) 180 MG tablet Take 180 mg by mouth daily.    Marland Kitchen gabapentin (NEURONTIN) 800 MG tablet Take 1 tablet (800 mg total) by mouth 4 (four) times daily. 360 tablet 0  . ibuprofen (ADVIL,MOTRIN) 600 MG tablet Take 600 mg by mouth daily.     Marland Kitchen levothyroxine (SYNTHROID, LEVOTHROID) 75 MCG tablet Take 1 tablet (75 mcg total) by mouth daily. 30 tablet 3  . lisinopril (ZESTRIL) 2.5 MG tablet Take 1 tablet (2.5 mg total) by mouth daily. 30 tablet 3  . methocarbamol (ROBAXIN) 500 MG tablet TAKE 2 TABLETS BY MOUTH EVERY 6 HOURS ASNEEDED FOR MUSCLE SPASMS 60 tablet 2  . metoprolol succinate (TOPROL XL) 25 MG 24 hr tablet Take 1 tablet (25 mg total) daily by mouth. 30 tablet 3  .  montelukast (SINGULAIR) 10 MG tablet TAKE ONE TABLET BY MOUTH EVERY NIGHT AT BEDTIME (Patient taking differently: Take 10 mg by mouth at bedtime) 90 tablet 3  . oxyCODONE-acetaminophen (PERCOCET/ROXICET) 5-325 MG tablet Take 1 tablet by mouth every 6 (six) hours as needed for severe pain. 30 tablet 0  . Polyvinyl Alcohol (LIQUID TEARS OP) Place 1 drop into both eyes as needed (for dry eyes).    . traMADol (ULTRAM) 50 MG tablet TAKE 1 TABLET BY MOUTH EVERY 8 HOURS AS NEEDED FOR MODERATE TO SEVEREPAIN 30 tablet 2  . Trolamine Salicylate (ASPERCREME EX) Apply 1 application topically as needed (for pain).    . vitamin B-12 (CYANOCOBALAMIN) 1000 MCG tablet Take 1 tablet by mouth daily.     No current facility-administered  medications on file prior to visit.     Allergies  Allergen Reactions  . Augmentin [Amoxicillin-Pot Clavulanate] Diarrhea  . Demerol [Meperidine] Other (See Comments)    "goes crazy and has the strength of 10 men"  . Eggs Or Egg-Derived Products Diarrhea and Nausea And Vomiting  . Sulfamethoxazole Other (See Comments)    Blisters form in mouth  . Statins Rash  . Sulfa Antibiotics Rash  . Sulfacetamide Sodium Rash    Past Medical History:  Diagnosis Date  . Anxiety   . Aphthous ulcer 10-04-12   improving  . Carpal tunnel syndrome, bilateral    damaged nerve in neck  . Chronic pain syndrome   . COPD (chronic obstructive pulmonary disease) ([facility])    mild  . Depression   . Diabetes mellitus without complication ([place])    type 2  . Diverticulitis of colon   . Esophageal reflux   . H/O acute prostatitis   . Headache(784.0)    hx of migraines  . Hypothyroidism   . Insomnia, unspecified   . Mixed hyperlipidemia   . Peyronie disease 12-20-12   PER uROLOGY  . Seasonal allergies   . Sleep apnea    uses cpap  . Tympanic membrane perforation    history of  . Unspecified essential hypertension     Past Surgical History:  Procedure Laterality Date  . LAPAROSCOPIC GASTRIC SLEEVE RESECTION N/A 07/31/2017   Procedure: LAPAROSCOPIC GASTRIC SLEEVE RESECTION WITH UPPER ENDOSCOPY;  Surgeon: Alphonsa Overall, MD;  Location: [facility];  Service: General;  Laterality: N/A;  . LumbarSacral Disc Surgery     L5-S1  . MENISCUS REPAIR Left    torn meniscus  . NESBIT PROCEDURE N/A 08/22/2013   Procedure: 16 DOT PLACTATION;  Surgeon: Claybon Jabs, MD;  Location: [facility];  Service: Urology;  Laterality: N/A;  . TYMPANOSTOMY TUBE PLACEMENT  1975  . VASECTOMY Bilateral 08/22/2013   Procedure: VASECTOMY;  Surgeon: Claybon Jabs, MD;  Location[facility];  Service: Urology;  Laterality: Bilateral;    Family History  Problem Relation Age of Onset  . Congestive  Heart Failure Father   . Heart disease Father   . COPD Father   . Renal cancer Paternal Grandfather        renal call carcinoma  . Heart disease Brother        stents at 8 yo  . Bladder Cancer Paternal Uncle   . Renal cancer Paternal Uncle        renal cell carcinoma  . Heart disease Other   . Parkinsonism Neg Hx     Social History   Socioeconomic History  . Marital status: Married    Spouse name:  [name]  . Number of children: 0  . Years of education: college  . Highest education level: Not on file  Social Needs  . Financial resource strain: Not on file  . Food insecurity - worry: Not on file  . Food insecurity - inability: Not on file  . Transportation needs - medical: Not on file  . Transportation needs - non-medical: Not on file  Occupational History  . 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 Smoking status: Former Smoker    Packs/day: 1.50    Years: 30.00    Pack years: 45.00    Types: Cigarettes    Last attempt to quit: 10/10/2007    Years since quitting: 10.1  . Smokeless tobacco: Never Used  Substance and Sexual Activity  . Alcohol use: Yes    Alcohol/week: 0.6 oz    Types: 1 Cans of beer per week    Comment: rarely  . Drug use: No  . Sexual activity: Yes  Other Topics Concern  . Not on file  Social History Narrative   Married.  [name])  No children. Patient has Best boy.   In process of getting disability for low back pain.   Right handed.   Caffeine- Coffee a pot but not every day.   The PMH, PSH, Social History, Family History, Medications, and allergies have been reviewed in [facility], and have been updated if relevant.   Review of Systems  HENT: Negative.   Eyes: Negative.   Respiratory: Negative.   Cardiovascular: Negative.   Gastrointestinal: Negative.   Endocrine: Negative.   Genitourinary: Positive for difficulty urinating. Negative for decreased urine volume, discharge, dysuria,  enuresis, flank pain, frequency, genital sores, hematuria, penile pain, penile swelling, scrotal swelling, testicular pain and urgency.  Musculoskeletal: Negative.   Skin: Negative.   Allergic/Immunologic: Negative.   Neurological: Negative.   Hematological: Negative.   Psychiatric/Behavioral: Negative.   All other systems reviewed and are negative.      Objective:    BP 114/64 (BP Location: Left Arm, Patient Position: Sitting, Cuff Size: Normal)   Pulse 63   Temp 98.8 F (37.1 C) (Oral)   Ht 6\' 2"  (1.88 m)   Wt 252 lb (114.3 kg)   SpO2 97%   BMI 32.35 kg/m    Physical Exam  General:  pleasant male in no acute distress Eyes:  PERRL Ears:  External ear exam shows no significant lesions or deformities.  TMs normal bilaterally Hearing is grossly normal bilaterally. Nose:  External nasal examination shows no deformity or inflammation. Nasal mucosa are pink and moist without lesions or exudates. Mouth:  Oral mucosa and oropharynx without lesions or exudates.  Teeth in good repair. Neck:  no carotid bruit or thyromegaly no cervical or supraclavicular lymphadenopathy  Lungs:  Normal respiratory effort, chest expands symmetrically. Lungs are clear to auscultation, no crackles or wheezes. Heart:  Normal rate and regular rhythm. S1 and S2 normal without gallop, murmur, click, rub or other extra sounds. Abdomen:  Bowel sounds positive,abdomen soft and non-tender without masses, organomegaly or hernias noted. Pulses:  R and L posterior tibial pulses are full and equal bilaterally  Extremities:  no edema  Psych:  Good eye contact, not anxious or depressed appearing      Assessment & Plan:   Type 2 diabetes mellitus with chronic kidney disease, without long-term current use of insulin, unspecified CKD stage ([place]) - Plan: POCT HgB A1C  S/P bariatric  surgery  Mixed hyperlipidemia  Other specified hypothyroidism  Urinary hesitancy  Need for Tdap vaccination - Plan: Tdap vaccine  greater than or equal to 7yo IM No Follow-up on file.

## 2017-12-13 NOTE — Assessment & Plan Note (Signed)
UA neg- no signs of UTI. Less likely prostatitis given description of symptoms. Will get PSA today, has follow up scheduled with urology.

## 2017-12-13 NOTE — Assessment & Plan Note (Signed)
Well controlled on current rx, no symptoms of orthostasis at this time.

## 2017-12-13 NOTE — Progress Notes (Signed)
Folate 7.9 Abstracted labs completed by Dr. Sherolyn Buba dmf

## 2018-01-03 ENCOUNTER — Other Ambulatory Visit: Payer: Self-pay | Admitting: Family Medicine

## 2018-01-06 DIAGNOSIS — S61204A Unspecified open wound of right ring finger without damage to nail, initial encounter: Secondary | ICD-10-CM | POA: Diagnosis not present

## 2018-01-18 ENCOUNTER — Other Ambulatory Visit: Payer: Self-pay | Admitting: Family Medicine

## 2018-01-23 DIAGNOSIS — E1169 Type 2 diabetes mellitus with other specified complication: Secondary | ICD-10-CM | POA: Diagnosis not present

## 2018-01-24 ENCOUNTER — Other Ambulatory Visit: Payer: Self-pay | Admitting: Family Medicine

## 2018-01-24 DIAGNOSIS — R3911 Hesitancy of micturition: Secondary | ICD-10-CM | POA: Diagnosis not present

## 2018-01-24 DIAGNOSIS — N401 Enlarged prostate with lower urinary tract symptoms: Secondary | ICD-10-CM | POA: Diagnosis not present

## 2018-01-27 LAB — BASIC METABOLIC PANEL
BUN: 19 (ref 4–21)
Creatinine: 1.4 — AB (ref 0.6–1.3)
Glucose: 126
Potassium: 4.3 (ref 3.4–5.3)
Sodium: 142 (ref 137–147)

## 2018-01-27 LAB — HEPATIC FUNCTION PANEL
ALT: 18 (ref 10–40)
AST: 17 (ref 14–40)
Alkaline Phosphatase: 68 (ref 25–125)
Bilirubin, Total: 1.2

## 2018-01-27 LAB — VITAMIN B12: VITAMIN B 12: 1970

## 2018-01-27 LAB — CBC AND DIFFERENTIAL
HEMATOCRIT: 43 (ref 41–53)
Hemoglobin: 14.6 (ref 13.5–17.5)
Neutrophils Absolute: 2907
PLATELETS: 188 (ref 150–399)
WBC: 5.2

## 2018-02-13 DIAGNOSIS — G4733 Obstructive sleep apnea (adult) (pediatric): Secondary | ICD-10-CM | POA: Diagnosis not present

## 2018-03-07 ENCOUNTER — Telehealth: Payer: Self-pay | Admitting: Family Medicine

## 2018-03-07 NOTE — Telephone Encounter (Signed)
Received a CRM stating patient wanted office to refile TDAP vaccine with his medicare Part D benefit. Called patient and explained that Medicare Part D is his pharmacy benefit and the office cannot file claims under Part D. Patient understood. I also explained to patient the medicare part d benefit and patient was well aware of the benefit. Pt stated he was told by his insurance company to pay the bill and submit through [employer] for reimbursement. Patient will do this option once he receive bill and will contact billing to get a receipt and itemized bill to have for his record.

## 2018-03-11 ENCOUNTER — Other Ambulatory Visit: Payer: Self-pay | Admitting: Family Medicine

## 2018-03-15 LAB — HM DIABETES EYE EXAM

## 2018-03-19 ENCOUNTER — Encounter: Payer: Self-pay | Admitting: Family Medicine

## 2018-03-21 ENCOUNTER — Encounter: Payer: Self-pay | Admitting: Family Medicine

## 2018-03-21 ENCOUNTER — Ambulatory Visit (INDEPENDENT_AMBULATORY_CARE_PROVIDER_SITE_OTHER): Payer: PPO | Admitting: Family Medicine

## 2018-03-21 VITALS — BP 112/70 | HR 50 | Temp 98.5°F | Ht 74.0 in | Wt 242.6 lb

## 2018-03-21 DIAGNOSIS — E1122 Type 2 diabetes mellitus with diabetic chronic kidney disease: Secondary | ICD-10-CM

## 2018-03-21 DIAGNOSIS — Z9884 Bariatric surgery status: Secondary | ICD-10-CM | POA: Diagnosis not present

## 2018-03-21 DIAGNOSIS — E119 Type 2 diabetes mellitus without complications: Secondary | ICD-10-CM | POA: Diagnosis not present

## 2018-03-21 DIAGNOSIS — I10 Essential (primary) hypertension: Secondary | ICD-10-CM | POA: Diagnosis not present

## 2018-03-21 DIAGNOSIS — Z23 Encounter for immunization: Secondary | ICD-10-CM | POA: Diagnosis not present

## 2018-03-21 MED ORDER — METHOCARBAMOL 500 MG PO TABS
ORAL_TABLET | ORAL | 5 refills | Status: DC
Start: 2018-03-21 — End: 2018-08-12

## 2018-03-21 MED ORDER — OXYCODONE-ACETAMINOPHEN 5-325 MG PO TABS: 1.0000 | ORAL_TABLET | Freq: Four times a day (QID) | ORAL | 0 refills | Status: DC | PRN

## 2018-03-21 MED ORDER — PANTOPRAZOLE SODIUM 40 MG PO TBEC: 40.0000 mg | DELAYED_RELEASE_TABLET | Freq: Every day | ORAL | 3 refills | Status: DC

## 2018-03-21 MED ORDER — MONTELUKAST SODIUM 10 MG PO TABS: 10.0000 mg | ORAL_TABLET | Freq: Every day | ORAL | 3 refills | Status: DC

## 2018-03-21 MED ORDER — GABAPENTIN 800 MG PO TABS: 800.0000 mg | ORAL_TABLET | Freq: Four times a day (QID) | ORAL | 1 refills | Status: DC

## 2018-03-21 NOTE — Progress Notes (Signed)
Corey Bell. Lucia Gaskins, MD

## 2018-03-21 NOTE — Assessment & Plan Note (Signed)
Diet controlled. Has been doing well.  Has lost an additional 10 pounds since last OV.

## 2018-03-21 NOTE — Patient Instructions (Addendum)
Great to see you.  We are stopping your lisinopril. Please check your blood pressure over the next weeks.  Call me or send me a mychart message in 2 weeks with your readings.  Please come see me in 3 months.

## 2018-03-21 NOTE — Assessment & Plan Note (Signed)
Now with symptoms of orthostasis. Still having SVT so cardiology would like for him to continue beta blocker.  Since diabetes is controlled, will d/c ACEI.  Continue betablocker. He will update me in 2 weeks with his BP and pulse.

## 2018-03-21 NOTE — Assessment & Plan Note (Signed)
Doing very well

## 2018-03-21 NOTE — Progress Notes (Signed)
Subjective:   Patient ID: Corey Bell, male    DOB: 09-04-1966, 52 y.o.   MRN: [id number]  JAMIE BELGER is a pleasant 52 y.o. year old male who presents to clinic today with Follow-up (Patient is here today for a 90-month-F/U. He would like to get the Shingrix today and ins said that ins told him it is covered at 100%.  He states that he feels like he is going to pass out when he stands and he thinks it is due to the Metoprolol. )  on 03/21/2018  HPI:  Gastric sleeve on 07/31/17- doing well.   Has been doing well, continues to lose weight. Wt Readings from Last 3 Encounters:  03/21/18 242 lb 9.6 oz (110 kg)  12/13/17 252 lb (114.3 kg)  10/16/17 261 lb 8 oz (118.6 kg)     DM- Stopped taking Metformin.  Still checking his FSBS Brings in FSBS fasting- running 80s- low 100s.  Lab Results  Component Value Date   HGBA1C 6.0 12/13/2017     HTN-  with continued weight loss, was experiencing symptoms of orthostasis so we have been weaning down his bp medications.  Currently taking lisinopril 2.5 mg daily and Metoprolol 12.5 mg daily.  Again he is starting to feel dizzy when standing.  This initially resolved when we last titrated down his BP medications.  Saw his cardiologist, Dr. Rockey Situ on 10/16/17. Note reviewed. Advised to continue Zetia.  Lab Results  Component Value Date   CHOL 162 07/11/2017   HDL 32.30 (L) 07/11/2017   LDLCALC 96 07/11/2017   LDLDIRECT 93.0 04/07/2015   TRIG 166.0 (H) 07/11/2017   CHOLHDL 5 07/11/2017    Hypothyroidism- currently taking synthroid 75 mcg daily. Denies any symptoms of hypo or hyperthyroidism. Lab Results  Component Value Date   TSH 1.50 12/13/2017    Current Outpatient Medications on File Prior to Visit  Medication Sig Dispense Refill  . buPROPion (WELLBUTRIN XL) 300 MG 24 hr tablet TAKE 1 TABLET BY MOUTH DAILY 30 tablet 2  . Cholecalciferol (VITAMIN D3) 5000 units TABS Take 1 tablet by mouth daily.    . citalopram (CELEXA) 40  MG tablet TAKE 1 TABLET BY MOUTH DAILY 30 tablet 3  . cyclobenzaprine (FLEXERIL) 10 MG tablet Take 1 tablet (10 mg total) by mouth at bedtime. 30 tablet 3  . diphenhydrAMINE (BENADRYL) 25 mg capsule Take 50 mg by mouth at bedtime.    Marland Kitchen erythromycin with ethanol (THERAMYCIN) 2 % external solution APPLY TWICE A DAY (Patient taking differently: Apply to affected area once daily) 60 mL 2  . ezetimibe (ZETIA) 10 MG tablet Take 1 tablet (10 mg total) by mouth daily. 90 tablet 3  . fexofenadine (ALLEGRA) 180 MG tablet Take 180 mg by mouth daily.    Marland Kitchen ibuprofen (ADVIL,MOTRIN) 600 MG tablet Take 600 mg by mouth daily.     Marland Kitchen levothyroxine (SYNTHROID, LEVOTHROID) 75 MCG tablet TAKE 1 TABLET (75 MCG TOTAL) BY MOUTH DAILY 30 tablet 3  . metoprolol succinate (TOPROL XL) 25 MG 24 hr tablet Take 1 tablet (25 mg total) daily by mouth. (Patient taking differently: Take 12.5 mg by mouth daily. ) 30 tablet 3  . Polyvinyl Alcohol (LIQUID TEARS OP) Place 1 drop into both eyes as needed (for dry eyes).    . tamsulosin (FLOMAX) 0.4 MG CAPS capsule Take 1 capsule by mouth at bedtime.    . traMADol (ULTRAM) 50 MG tablet TAKE 1 TABLET BY MOUTH EVERY 8 HOURS  AS NEEDED FOR MODERATE TO SEVEREPAIN 30 tablet 2  . Trolamine Salicylate (ASPERCREME EX) Apply 1 application topically as needed (for pain).    . vitamin B-12 (CYANOCOBALAMIN) 1000 MCG tablet Take 1 tablet by mouth daily.     No current facility-administered medications on file prior to visit.     Allergies  Allergen Reactions  . Augmentin [Amoxicillin-Pot Clavulanate] Diarrhea  . Demerol [Meperidine] Other (See Comments)    "goes crazy and has the strength of 10 men"  . Eggs Or Egg-Derived Products Diarrhea and Nausea And Vomiting  . Sulfamethoxazole Other (See Comments)    Blisters form in mouth  . Statins Rash  . Sulfa Antibiotics Rash  . Sulfacetamide Sodium Rash    Past Medical History:  Diagnosis Date  . Anxiety   . Aphthous ulcer 10-04-12    improving  . Carpal tunnel syndrome, bilateral    damaged nerve in neck  . Chronic pain syndrome   . COPD (chronic obstructive pulmonary disease) (HCC)    mild  . Depression   . Diabetes mellitus without complication ([place])    type 2  . Diverticulitis of colon   . Esophageal reflux   . H/O acute prostatitis   . Headache(784.0)    hx of migraines  . Hypothyroidism   . Insomnia, unspecified   . Mixed hyperlipidemia   . Peyronie disease 12-20-12   PER uROLOGY  . Seasonal allergies   . Sleep apnea    uses cpap  . Tympanic membrane perforation    history of  . Unspecified essential hypertension     Past Surgical History:  Procedure Laterality Date  . LAPAROSCOPIC GASTRIC SLEEVE RESECTION N/A 07/31/2017   Procedure: LAPAROSCOPIC GASTRIC SLEEVE RESECTION WITH UPPER ENDOSCOPY;  Surgeon: Alphonsa Overall, MD;  Location: [facility];  Service: General;  Laterality: N/A;  . LumbarSacral Disc Surgery     L5-S1  . MENISCUS REPAIR Left    torn meniscus  . NESBIT PROCEDURE N/A 08/22/2013   Procedure: 16 DOT PLACTATION;  Surgeon: Claybon Jabs, MD;  Location: [facility];  Service: Urology;  Laterality: N/A;  . TYMPANOSTOMY TUBE PLACEMENT  1975  . VASECTOMY Bilateral 08/22/2013   Procedure: VASECTOMY;  Surgeon: Claybon Jabs, MD;  Location[facility];  Service: Urology;  Laterality: Bilateral;    Family History  Problem Relation Age of Onset  . Congestive Heart Failure Father   . Heart disease Father   . COPD Father   . Renal cancer Paternal Grandfather        renal call carcinoma  . Heart disease Brother        stents at 51 yo  . Bladder Cancer Paternal Uncle   . Renal cancer Paternal Uncle        renal cell carcinoma  . Heart disease Other   . Parkinsonism Neg Hx     Social History   Socioeconomic History  . Marital status: Married    Spouse name: [name]  . Number of children: 0  . Years of education: college  . Highest education level: Not on  file  Occupational History  . 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 Financial resource strain: Not on file  . Food insecurity:    Worry: Not on file    Inability: Not on file  . Transportation needs:    Medical: Not on file    Non-medical: Not on file  Tobacco Use  . Smoking status: Former Smoker    Packs/day: 1.50    Years: 30.00    Pack years: 45.00    Types: Cigarettes    Last attempt to quit: 10/10/2007    Years since quitting: 10.4  . Smokeless tobacco: Never Used  Substance and Sexual Activity  . Alcohol use: Yes    Alcohol/week: 0.6 oz    Types: 1 Cans of beer per week    Comment: rarely  . Drug use: No  . Sexual activity: Yes  Lifestyle  . Physical activity:    Days per week: Not on file    Minutes per session: Not on file  . Stress: Not on file  Relationships  . Social connections:    Talks on phone: Not on file    Gets together: Not on file    Attends religious service: Not on file    Active member of club or organization: Not on file    Attends meetings of clubs or organizations: Not on file    Relationship status: Not on file  . Intimate partner violence:    Fear of current or ex partner: Not on file    Emotionally abused: Not on file    Physically abused: Not on file    Forced sexual activity: Not on file  Other Topics Concern  . Not on file  Social History Narrative   Married.  [name])  No children. Patient has Best boy.   In process of getting disability for low back pain.   Right handed.   Caffeine- Coffee a pot but not every day.   The PMH, PSH, Social History, Family History, Medications, and allergies have been reviewed in [facility], and have been updated if relevant.   Review of Systems  HENT: Negative.   Eyes: Negative.   Respiratory: Negative.   Cardiovascular: Negative.   Gastrointestinal: Negative.   Endocrine: Negative.   Genitourinary: Negative.  Negative for decreased urine  volume, discharge, dysuria, enuresis, flank pain, frequency, genital sores, hematuria, penile pain, penile swelling, scrotal swelling, testicular pain and urgency.  Musculoskeletal: Negative.   Skin: Negative.   Allergic/Immunologic: Negative.   Neurological: Positive for dizziness. Negative for tremors, seizures, syncope, facial asymmetry, speech difficulty, weakness, light-headedness, numbness and headaches.  Hematological: Negative.   Psychiatric/Behavioral: Negative.   All other systems reviewed and are negative.      Objective:    BP 112/70 (BP Location: Left Arm, Patient Position: Sitting, Cuff Size: Normal)   Pulse (!) 50   Temp 98.5 F (36.9 C) (Oral)   Ht 6\' 2"  (1.88 m)   Wt 242 lb 9.6 oz (110 kg)   SpO2 96%   BMI 31.15 kg/m   Wt Readings from Last 3 Encounters:  03/21/18 242 lb 9.6 oz (110 kg)  12/13/17 252 lb (114.3 kg)  10/16/17 261 lb 8 oz (118.6 kg)    Physical Exam   General:  Well-developed,well-nourished,in no acute distress; alert,appropriate and cooperative throughout examination Head:  normocephalic and atraumatic.   Eyes:  vision grossly intact, PERRL Ears:  R ear normal and L ear normal externally, TMs clear bilaterally Nose:  no external deformity.   Mouth:  good dentition.   Neck:  No deformities, masses, or tenderness noted. Breasts:  No mass, nodules, thickening, tenderness, bulging, retraction, inflamation, nipple discharge or skin changes noted.   Lungs:  Normal respiratory effort, chest expands symmetrically. Lungs are clear to auscultation, no crackles or wheezes. Heart:  Normal rate and regular  rhythm. S1 and S2 normal without gallop, murmur, click, rub or other extra sounds. Msk:  No deformity or scoliosis noted of thoracic or lumbar spine.   Extremities:  No clubbing, cyanosis, edema, or deformity noted with normal full range of motion of all joints.   Neurologic:  alert & oriented X3 and gait normal.   Skin:  Intact without suspicious  lesions or rashes Psych:  Cognition and judgment appear intact. Alert and cooperative with normal attention span and concentration. No apparent delusions, illusions, hallucinations       Assessment & Plan:   Type 2 diabetes mellitus with chronic kidney disease, without long-term current use of insulin, unspecified CKD stage (HCC) - Plan: CANCELED: POCT HgB A1C  Need for zoster vaccine - Plan: Varicella-zoster vaccine IM  Essential hypertension  Diabetes mellitus, new onset ([place])  S/P bariatric surgery No follow-ups on file.

## 2018-03-26 ENCOUNTER — Encounter: Payer: Self-pay | Admitting: Family Medicine

## 2018-04-01 ENCOUNTER — Other Ambulatory Visit: Payer: Self-pay | Admitting: Family Medicine

## 2018-04-04 ENCOUNTER — Encounter: Payer: Self-pay | Admitting: Family Medicine

## 2018-04-25 ENCOUNTER — Other Ambulatory Visit: Payer: Self-pay | Admitting: Family Medicine

## 2018-04-30 ENCOUNTER — Other Ambulatory Visit: Payer: Self-pay | Admitting: Family Medicine

## 2018-05-09 ENCOUNTER — Other Ambulatory Visit: Payer: Self-pay | Admitting: Family Medicine

## 2018-05-21 ENCOUNTER — Ambulatory Visit (INDEPENDENT_AMBULATORY_CARE_PROVIDER_SITE_OTHER): Payer: PPO

## 2018-05-21 DIAGNOSIS — Z23 Encounter for immunization: Secondary | ICD-10-CM | POA: Diagnosis not present

## 2018-05-21 NOTE — Progress Notes (Addendum)
Pt presented for shingles vaccination #2. Verbal order given by Dr. Raeford Razor. IM injection given in left deltoid.Pt tolerated well. Pt showed no S/S of reaction before check out.

## 2018-05-27 ENCOUNTER — Other Ambulatory Visit: Payer: Self-pay | Admitting: Family Medicine

## 2018-06-27 ENCOUNTER — Ambulatory Visit: Payer: Self-pay | Admitting: Family Medicine

## 2018-07-09 ENCOUNTER — Other Ambulatory Visit: Payer: Self-pay | Admitting: Family Medicine

## 2018-07-15 ENCOUNTER — Ambulatory Visit (INDEPENDENT_AMBULATORY_CARE_PROVIDER_SITE_OTHER): Payer: PPO | Admitting: Family Medicine

## 2018-07-15 VITALS — BP 112/60 | HR 56 | Temp 98.3°F | Ht 74.0 in | Wt 241.4 lb

## 2018-07-15 DIAGNOSIS — Z8249 Family history of ischemic heart disease and other diseases of the circulatory system: Secondary | ICD-10-CM

## 2018-07-15 DIAGNOSIS — R42 Dizziness and giddiness: Secondary | ICD-10-CM

## 2018-07-15 DIAGNOSIS — R070 Pain in throat: Secondary | ICD-10-CM | POA: Insufficient documentation

## 2018-07-15 DIAGNOSIS — E038 Other specified hypothyroidism: Secondary | ICD-10-CM

## 2018-07-15 DIAGNOSIS — R3911 Hesitancy of micturition: Secondary | ICD-10-CM

## 2018-07-15 DIAGNOSIS — I10 Essential (primary) hypertension: Secondary | ICD-10-CM | POA: Diagnosis not present

## 2018-07-15 DIAGNOSIS — E119 Type 2 diabetes mellitus without complications: Secondary | ICD-10-CM

## 2018-07-15 LAB — COMPREHENSIVE METABOLIC PANEL
ALBUMIN: 4.2 g/dL (ref 3.5–5.2)
ALK PHOS: 64 U/L (ref 39–117)
ALT: 16 U/L (ref 0–53)
AST: 16 U/L (ref 0–37)
BILIRUBIN TOTAL: 1.2 mg/dL (ref 0.2–1.2)
BUN: 20 mg/dL (ref 6–23)
CALCIUM: 9.3 mg/dL (ref 8.4–10.5)
CO2: 29 mEq/L (ref 19–32)
Chloride: 103 mEq/L (ref 96–112)
Creatinine, Ser: 1.25 mg/dL (ref 0.40–1.50)
GFR: 64.53 mL/min (ref >60.00)
GLUCOSE: 102 mg/dL — AB (ref 70–99)
POTASSIUM: 4.5 meq/L (ref 3.5–5.1)
Sodium: 139 mEq/L (ref 135–145)
Total Protein: 6.7 g/dL (ref 6.0–8.3)

## 2018-07-15 LAB — TSH: TSH: 2.3 u[IU]/mL (ref 0.35–4.50)

## 2018-07-15 LAB — PSA: PSA: 0.3 ng/mL (ref 0.10–4.00)

## 2018-07-15 LAB — T4, FREE: Free T4: 0.83 ng/dL (ref 0.60–1.60)

## 2018-07-15 LAB — HEMOGLOBIN A1C: HEMOGLOBIN A1C: 5.9 % (ref 4.6–6.5)

## 2018-07-15 MED ORDER — METOPROLOL TARTRATE 25 MG PO TABS: 12.5000 mg | ORAL_TABLET | Freq: Every day | ORAL | 3 refills | Status: DC

## 2018-07-15 MED ORDER — LEVOTHYROXINE SODIUM 75 MCG PO TABS: 75.0000 ug | ORAL_TABLET | Freq: Every day | ORAL | 30 refills | Status: DC

## 2018-07-15 MED ORDER — EZETIMIBE 10 MG PO TABS: 10.0000 mg | ORAL_TABLET | Freq: Every day | ORAL | 3 refills | Status: DC

## 2018-07-15 MED ORDER — BUPROPION HCL ER (XL) 300 MG PO TB24: 300.0000 mg | ORAL_TABLET | Freq: Every day | ORAL | 2 refills | Status: DC

## 2018-07-15 NOTE — Assessment & Plan Note (Addendum)
New- intermittent but persistent. Unlikely infectious given duration of symptoms. ?PND but he does need to see ENT for laryngoscopy to follow up given duration of symptoms and smoking pack year history.  He sees Dr. Tami Ribas and he would like to call directly to schedule an appointment. Check TSH today.

## 2018-07-15 NOTE — Assessment & Plan Note (Signed)
See below- now hypotensive. Decrease metoprolol to 12.5 mg daily. Follow up in 2 weeks. The patient indicates understanding of these issues and agrees with the plan.

## 2018-07-15 NOTE — Progress Notes (Signed)
Subjective:   Patient ID: Corey Bell, male    DOB: 12/07/1965, 52 y.o.   MRN: [id number]  Corey Bell is a pleasant 52 y.o. year old male who presents to clinic today with Sore Throat (sore throat for a few months, also has a possible wart on left foot. Has had a few dizzy spells last month as well, maybe low blood sugar (he doesn't check).  He has a 45-Pack-Year history and quit date of 1.1.09 with 1.5ppd.) and Follow-up  on 07/15/2018  HPI:  Dizzy spells- over the past month.  Sporadic.  He does not feel it has anything to do with standing from a seated position or change in any body position.  Usually feels it when he hasn't eaten in awhile.  Gets better when he drinks some OJ or eats a cracker or two.   Has not checked his FSBS or blood pressure when it happens.  No syncopal episodes.  Wt Readings from Last 3 Encounters:  07/15/18 241 lb 6.4 oz (109.5 kg)  03/21/18 242 lb 9.6 oz (110 kg)  12/13/17 252 lb (114.3 kg)   BP Readings from Last 3 Encounters:  07/15/18 112/60  03/21/18 112/70  12/13/17 114/64   Diabetes has been diet controlled since bariatric surgery. Lab Results  Component Value Date   HGBA1C 6.0 12/13/2017   Hypothyroidism- on synthroid 75 mcg daily. Lab Results  Component Value Date   TSH 1.50 12/13/2017   Throat pain- intermittent for past couple of months.  Hurts to swallow but foods and liquids not getting stuck.  Hurts more on left side than right. Former smoker.  HTN- we have been weaning his blood pressure medications and diabetes medications down as he has lost weight.  Currently taking Toprol XL 25 mg daily only for HTN.  No longer taking any rxs for diabetes. He has occasional PVCs and a strong FH of premature CAD.  Urinary hesitancy followed by Dr. Consuella Lose.  Improved when he started him on flomax but over the past couple month, symptoms have returned.  No dysuria or other urinary symptoms.  Lab Results  Component Value Date   PSA 0.31  07/01/2013    Current Outpatient Medications on File Prior to Visit  Medication Sig Dispense Refill  . Cholecalciferol (VITAMIN D3) 5000 units TABS Take 1 tablet by mouth daily.    . citalopram (CELEXA) 40 MG tablet TAKE 1 TABLET BY MOUTH DAILY 30 tablet 3  . cyclobenzaprine (FLEXERIL) 10 MG tablet Take 1 tablet (10 mg total) by mouth at bedtime. 30 tablet 3  . diphenhydrAMINE (BENADRYL) 25 mg capsule Take 50 mg by mouth at bedtime.    Marland Kitchen erythromycin with ethanol (THERAMYCIN) 2 % external solution APPLY TOPICALLY TWICE A DAY AS NEEDED 60 mL 2  . fexofenadine (ALLEGRA) 180 MG tablet Take 180 mg by mouth daily.    Marland Kitchen gabapentin (NEURONTIN) 800 MG tablet Take 1 tablet (800 mg total) by mouth 4 (four) times daily. 360 tablet 1  . ibuprofen (ADVIL,MOTRIN) 600 MG tablet Take 600 mg by mouth daily.     . methocarbamol (ROBAXIN) 500 MG tablet TAKE 2 TABLETS BY MOUTH EVERY 6 HOURS ASNEEDED FOR MUSCLE SPASMS 60 tablet 5  . montelukast (SINGULAIR) 10 MG tablet Take 1 tablet (10 mg total) by mouth at bedtime. 90 tablet 3  . oxyCODONE-acetaminophen (PERCOCET/ROXICET) 5-325 MG tablet Take 1 tablet by mouth every 6 (six) hours as needed for severe pain. 30 tablet 0  . pantoprazole (  PROTONIX) 40 MG tablet Take 1 tablet (40 mg total) by mouth daily. 90 tablet 3  . Polyvinyl Alcohol (LIQUID TEARS OP) Place 1 drop into both eyes as needed (for dry eyes).    . tamsulosin (FLOMAX) 0.4 MG CAPS capsule Take 1 capsule by mouth at bedtime.    . traMADol (ULTRAM) 50 MG tablet TAKE 1 TABLET BY MOUTH EVERY 8 HOURS AS NEEDED FOR MODERATE TO SEVEREPAIN 30 tablet 2  . Trolamine Salicylate (ASPERCREME EX) Apply 1 application topically as needed (for pain).    . vitamin B-12 (CYANOCOBALAMIN) 1000 MCG tablet Take 1 tablet by mouth daily.     No current facility-administered medications on file prior to visit.     Allergies  Allergen Reactions  . Augmentin [Amoxicillin-Pot Clavulanate] Diarrhea  . Demerol [Meperidine]  Other (See Comments)    "goes crazy and has the strength of 10 men"  . Eggs Or Egg-Derived Products Diarrhea and Nausea And Vomiting  . Sulfamethoxazole Other (See Comments)    Blisters form in mouth  . Statins Rash  . Sulfa Antibiotics Rash  . Sulfacetamide Sodium Rash    Past Medical History:  Diagnosis Date  . Anxiety   . Aphthous ulcer 10-04-12   improving  . Carpal tunnel syndrome, bilateral    damaged nerve in neck  . Chronic pain syndrome   . COPD (chronic obstructive pulmonary disease) (HCC)    mild  . Depression   . Diabetes mellitus without complication ([place])    type 2  . Diverticulitis of colon   . Esophageal reflux   . H/O acute prostatitis   . Headache(784.0)    hx of migraines  . Hypothyroidism   . Insomnia, unspecified   . Mixed hyperlipidemia   . Peyronie disease 12-20-12   PER uROLOGY  . Seasonal allergies   . Sleep apnea    uses cpap  . Tympanic membrane perforation    history of  . Unspecified essential hypertension     Past Surgical History:  Procedure Laterality Date  . LAPAROSCOPIC GASTRIC SLEEVE RESECTION N/A 07/31/2017   Procedure: LAPAROSCOPIC GASTRIC SLEEVE RESECTION WITH UPPER ENDOSCOPY;  Surgeon: Alphonsa Overall, MD;  Location: [facility];  Service: General;  Laterality: N/A;  . LumbarSacral Disc Surgery     L5-S1  . MENISCUS REPAIR Left    torn meniscus  . NESBIT PROCEDURE N/A 08/22/2013   Procedure: 16 DOT PLACTATION;  Surgeon: Claybon Jabs, MD;  Location: [facility];  Service: Urology;  Laterality: N/A;  . TYMPANOSTOMY TUBE PLACEMENT  1975  . VASECTOMY Bilateral 08/22/2013   Procedure: VASECTOMY;  Surgeon: Claybon Jabs, MD;  Location: [facility];  Service: Urology;  Laterality: Bilateral;    Family History  Problem Relation Age of Onset  . Congestive Heart Failure Father   . Heart disease Father   . COPD Father   . Renal cancer Paternal Grandfather        renal call carcinoma  . Heart disease  Brother        stents at 51 yo  . Bladder Cancer Paternal Uncle   . Renal cancer Paternal Uncle        renal cell carcinoma  . Heart disease Other   . Parkinsonism Neg Hx     Social History   Socioeconomic History  . Marital status: Married    Spouse name: [name]  . Number of children: 0  . Years of education: college  . Highest education level:  Not on file  Occupational History  . 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 Financial resource strain: Not on file  . Food insecurity:    Worry: Not on file    Inability: Not on file  . Transportation needs:    Medical: Not on file    Non-medical: Not on file  Tobacco Use  . Smoking status: Former Smoker    Packs/day: 1.50    Years: 30.00    Pack years: 45.00    Types: Cigarettes    Last attempt to quit: 10/10/2007    Years since quitting: 10.7  . Smokeless tobacco: Never Used  Substance and Sexual Activity  . Alcohol use: Yes    Alcohol/week: 1.0 standard drinks    Types: 1 Cans of beer per week    Comment: rarely  . Drug use: No  . Sexual activity: Yes  Lifestyle  . Physical activity:    Days per week: Not on file    Minutes per session: Not on file  . Stress: Not on file  Relationships  . Social connections:    Talks on phone: Not on file    Gets together: Not on file    Attends religious service: Not on file    Active member of club or organization: Not on file    Attends meetings of clubs or organizations: Not on file    Relationship status: Not on file  . Intimate partner violence:    Fear of current or ex partner: Not on file    Emotionally abused: Not on file    Physically abused: Not on file    Forced sexual activity: Not on file  Other Topics Concern  . Not on file  Social History Narrative   Married.  [name])  No children. Patient has Best boy.   In process of getting disability for low back pain.   Right handed.   Caffeine- Coffee a pot but  not every day.   The PMH, PSH, Social History, Family History, Medications, and allergies have been reviewed in [facility], and have been updated if relevant.   Review of Systems  Constitutional: Positive for fatigue.  Eyes: Negative.   Respiratory: Negative.   Cardiovascular: Negative.   Gastrointestinal: Negative.   Endocrine: Negative.   Genitourinary: Positive for difficulty urinating. Negative for decreased urine volume, dysuria, enuresis, flank pain, frequency, genital sores, penile swelling, scrotal swelling, testicular pain and urgency.  Musculoskeletal: Negative.   Neurological: Positive for dizziness. Negative for tremors, seizures, syncope, facial asymmetry, speech difficulty, light-headedness, numbness and headaches.  Psychiatric/Behavioral: Negative.   All other systems reviewed and are negative.      Objective:    BP 112/60 (BP Location: Left Arm, Patient Position: Sitting, Cuff Size: Normal)   Pulse (!) 56   Temp 98.3 F (36.8 C) (Oral)   Ht 6\' 2"  (1.88 m)   Wt 241 lb 6.4 oz (109.5 kg)   SpO2 95%   BMI 30.99 kg/m    Physical Exam  Constitutional: He is oriented to person, place, and time. He appears well-developed and well-nourished.  Non-toxic appearance. He does not appear ill. No distress.  HENT:  Head: Normocephalic and atraumatic.  Mouth/Throat: No oral lesions. No uvula swelling. Posterior oropharyngeal erythema present. No oropharyngeal exudate or posterior oropharyngeal edema.  Eyes: Pupils are equal, round, and reactive to light. EOM are normal.  Neck: Normal range of motion. No thyromegaly present.  Cardiovascular:  Bradycardia present.  Pulmonary/Chest: Effort normal and breath sounds normal.  Neurological: He is alert and oriented to person, place, and time.  Skin: Skin is warm and dry.  Psychiatric: He has a normal mood and affect. His behavior is normal.  Nursing note and vitals reviewed.         Assessment & Plan:   Dizziness - Plan:  Comprehensive metabolic panel  Throat pain  Family history of heart disease  Diet-controlled diabetes mellitus ([place]) - Plan: Comprehensive metabolic panel, Hemoglobin A1c  Other specified hypothyroidism - Plan: TSH, T4, free  Urinary hesitancy - Plan: PSA Return in about 2 weeks (around 07/29/2018) for blood pressure recheck.Marland Kitchen

## 2018-07-15 NOTE — Assessment & Plan Note (Signed)
I suspect this is due to orthostasis.  Will also check an a1c today and I did advise him to snack between meals and to check FSBS and BP if he has another dizzy spell. Decrease metoprolol to 12.5 mg daily. Follow up in 2 weeks. The patient indicates understanding of these issues and agrees with the plan.

## 2018-07-15 NOTE — Patient Instructions (Addendum)
Great to see you. Please STOP taking Toprol XL 25 mg daily. Start taking metoprolol 12.5 mg daily (1/2 of 25 mg tablet).  Please come see me in 2 weeks.  I will call you with your lab results from today and you can view them online.   Please make an appointment with Dr. Tami Ribas.

## 2018-07-15 NOTE — Assessment & Plan Note (Signed)
Deteriorated again.  Was improved on flomax.  Check PSA today, advised to follow up with Dr. Consuella Lose.  Pt will call him directly to schedule an appointment.

## 2018-07-16 ENCOUNTER — Telehealth: Payer: Self-pay

## 2018-07-16 NOTE — Telephone Encounter (Signed)
TA-Pt was advised to take 1/2 tab of the Metoprolol daily but he was already doing that/what would you like for me e to tell him? Plz advise/thx dmf

## 2018-07-16 NOTE — Telephone Encounter (Signed)
Copied from [place] [id number]. Topic: General - Other >> Jul 16, 2018  7:57 AM Yvette Rack wrote: Reason for CRM: pt states that he was told by Dr Deborra Medina yesterday to take half of the metoprolol tartrate (LOPRESSOR) 25 MG tablet and he states that he was already taking half before she told him

## 2018-07-16 NOTE — Telephone Encounter (Signed)
LMOVM stating to Nayelie Gionfriddo/C metoprolol and keep his appt as scheduled/thx dmf

## 2018-07-16 NOTE — Addendum Note (Signed)
Addended by: Lucille Passy on: 07/16/2018 03:05 PM   Modules accepted: Orders

## 2018-07-16 NOTE — Telephone Encounter (Signed)
Just STOP taking metoprolol.  I would remove it from his med list and keep his follow up appointment with me.

## 2018-07-22 ENCOUNTER — Telehealth: Payer: Self-pay

## 2018-07-22 NOTE — Telephone Encounter (Signed)
Left VM for her to return call and speak with Purvis Sheffield, NDC# is [phone] for Shingrix

## 2018-07-22 NOTE — Telephone Encounter (Signed)
Copied from [place] [id number]. Topic: General - Other >> Jul 15, 2018 11:51 AM Leward Quan A wrote: Reason for CRM: Corey Bell with [facility] called to request NDC# for shingles vaccine that was given to patient on 05/21/18. Patient is trying to be reimbursed for the vaccine. Fax# [phone] Ph# [phone]

## 2018-07-30 DIAGNOSIS — R3911 Hesitancy of micturition: Secondary | ICD-10-CM | POA: Diagnosis not present

## 2018-07-30 DIAGNOSIS — N401 Enlarged prostate with lower urinary tract symptoms: Secondary | ICD-10-CM | POA: Diagnosis not present

## 2018-07-30 NOTE — Telephone Encounter (Signed)
[place] given to [facility].

## 2018-08-01 ENCOUNTER — Ambulatory Visit: Payer: PPO | Admitting: Family Medicine

## 2018-08-07 ENCOUNTER — Other Ambulatory Visit: Payer: Self-pay

## 2018-08-08 ENCOUNTER — Ambulatory Visit (INDEPENDENT_AMBULATORY_CARE_PROVIDER_SITE_OTHER): Payer: PPO | Admitting: Family Medicine

## 2018-08-08 ENCOUNTER — Encounter: Payer: Self-pay | Admitting: Family Medicine

## 2018-08-08 VITALS — BP 122/74 | HR 64 | Temp 98.5°F | Ht 74.0 in | Wt 244.0 lb

## 2018-08-08 DIAGNOSIS — R42 Dizziness and giddiness: Secondary | ICD-10-CM | POA: Diagnosis not present

## 2018-08-08 DIAGNOSIS — I10 Essential (primary) hypertension: Secondary | ICD-10-CM | POA: Diagnosis not present

## 2018-08-08 NOTE — Assessment & Plan Note (Signed)
Well controlled without rx and dizziness has resolved. No further intervention or work up needed. Follow up in 3 months for CPX, sooner if he has any concerns. The patient indicates understanding of these issues and agrees with the plan.

## 2018-08-08 NOTE — Patient Instructions (Signed)
Great to see you!   

## 2018-08-08 NOTE — Progress Notes (Signed)
Subjective:   Patient ID: Corey Bell, male    DOB: 14-Sep-1966, 52 y.o.   MRN: [id number]  Corey Bell is a pleasant 52 y.o. year old male who presents to clinic today with Follow-up (Was seen on 07/15/18-BP was 112/60. Discontinued Toporol. Doing well, denies headaches or dizziness. )  on 08/08/2018  HPI:  Here for follow up.  Saw him on 07/15/18- note reviewed.  Pulse was low and BP remained low so we d/c'd his toprol. He is here to follow up this medication change today.  He is doing well.  Denies dizziness, HA or fatigue.  Current Outpatient Medications on File Prior to Visit  Medication Sig Dispense Refill  . buPROPion (WELLBUTRIN XL) 300 MG 24 hr tablet Take 1 tablet (300 mg total) by mouth daily. 30 tablet 2  . Cholecalciferol (VITAMIN D3) 5000 units TABS Take 1 tablet by mouth daily.    . citalopram (CELEXA) 40 MG tablet TAKE 1 TABLET BY MOUTH DAILY 30 tablet 3  . cyclobenzaprine (FLEXERIL) 10 MG tablet Take 1 tablet (10 mg total) by mouth at bedtime. 30 tablet 3  . diphenhydrAMINE (BENADRYL) 25 mg capsule Take 50 mg by mouth at bedtime.    Marland Kitchen erythromycin with ethanol (THERAMYCIN) 2 % external solution APPLY TOPICALLY TWICE A DAY AS NEEDED 60 mL 2  . ezetimibe (ZETIA) 10 MG tablet Take 1 tablet (10 mg total) by mouth daily. 90 tablet 3  . fexofenadine (ALLEGRA) 180 MG tablet Take 180 mg by mouth daily.    Marland Kitchen gabapentin (NEURONTIN) 800 MG tablet Take 1 tablet (800 mg total) by mouth 4 (four) times daily. 360 tablet 1  . ibuprofen (ADVIL,MOTRIN) 600 MG tablet Take 600 mg by mouth daily.     Marland Kitchen levothyroxine (SYNTHROID, LEVOTHROID) 75 MCG tablet Take 1 tablet (75 mcg total) by mouth daily. 30 tablet 30  . methocarbamol (ROBAXIN) 500 MG tablet TAKE 2 TABLETS BY MOUTH EVERY 6 HOURS ASNEEDED FOR MUSCLE SPASMS 60 tablet 5  . montelukast (SINGULAIR) 10 MG tablet Take 1 tablet (10 mg total) by mouth at bedtime. 90 tablet 3  . oxyCODONE-acetaminophen (PERCOCET/ROXICET) 5-325 MG  tablet Take 1 tablet by mouth every 6 (six) hours as needed for severe pain. 30 tablet 0  . pantoprazole (PROTONIX) 40 MG tablet Take 1 tablet (40 mg total) by mouth daily. 90 tablet 3  . Polyvinyl Alcohol (LIQUID TEARS OP) Place 1 drop into both eyes as needed (for dry eyes).    . traMADol (ULTRAM) 50 MG tablet TAKE 1 TABLET BY MOUTH EVERY 8 HOURS AS NEEDED FOR MODERATE TO SEVEREPAIN 30 tablet 2  . Trolamine Salicylate (ASPERCREME EX) Apply 1 application topically as needed (for pain).    . vitamin B-12 (CYANOCOBALAMIN) 1000 MCG tablet Take 1 tablet by mouth daily.     No current facility-administered medications on file prior to visit.     Allergies  Allergen Reactions  . Augmentin [Amoxicillin-Pot Clavulanate] Diarrhea  . Demerol [Meperidine] Other (See Comments)    "goes crazy and has the strength of 10 men"  . Eggs Or Egg-Derived Products Diarrhea and Nausea And Vomiting  . Sulfamethoxazole Other (See Comments)    Blisters form in mouth  . Statins Rash  . Sulfa Antibiotics Rash  . Sulfacetamide Sodium Rash    Past Medical History:  Diagnosis Date  . Anxiety   . Aphthous ulcer 10-04-12   improving  . Carpal tunnel syndrome, bilateral    damaged nerve in neck  .  Chronic pain syndrome   . COPD (chronic obstructive pulmonary disease) (HCC)    mild  . Depression   . Diabetes mellitus without complication ([place])    type 2  . Diverticulitis of colon   . Esophageal reflux   . H/O acute prostatitis   . Headache(784.0)    hx of migraines  . Hypothyroidism   . Insomnia, unspecified   . Mixed hyperlipidemia   . Peyronie disease 12-20-12   PER uROLOGY  . Seasonal allergies   . Sleep apnea    uses cpap  . Tympanic membrane perforation    history of  . Unspecified essential hypertension     Past Surgical History:  Procedure Laterality Date  . LAPAROSCOPIC GASTRIC SLEEVE RESECTION N/A 07/31/2017   Procedure: LAPAROSCOPIC GASTRIC SLEEVE RESECTION WITH UPPER ENDOSCOPY;   Surgeon: Alphonsa Overall, MD;  Location: [facility];  Service: General;  Laterality: N/A;  . LumbarSacral Disc Surgery     L5-S1  . MENISCUS REPAIR Left    torn meniscus  . NESBIT PROCEDURE N/A 08/22/2013   Procedure: 16 DOT PLACTATION;  Surgeon: Claybon Jabs, MD;  Location: [facility];  Service: Urology;  Laterality: N/A;  . TYMPANOSTOMY TUBE PLACEMENT  1975  . VASECTOMY Bilateral 08/22/2013   Procedure: VASECTOMY;  Surgeon: Claybon Jabs, MD;  Location[facility];  Service: Urology;  Laterality: Bilateral;    Family History  Problem Relation Age of Onset  . Congestive Heart Failure Father   . Heart disease Father   . COPD Father   . Renal cancer Paternal Grandfather        renal call carcinoma  . Heart disease Brother        stents at 29 yo  . Bladder Cancer Paternal Uncle   . Renal cancer Paternal Uncle        renal cell carcinoma  . Heart disease Other   . Parkinsonism Neg Hx     Social History   Socioeconomic History  . Marital status: Married    Spouse name: [name]  . Number of children: 0  . Years of education: college  . Highest education level: Not on file  Occupational History  . 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 Financial resource strain: Not on file  . Food insecurity:    Worry: Not on file    Inability: Not on file  . Transportation needs:    Medical: Not on file    Non-medical: Not on file  Tobacco Use  . Smoking status: Former Smoker    Packs/day: 1.50    Years: 30.00    Pack years: 45.00    Types: Cigarettes    Last attempt to quit: 10/10/2007    Years since quitting: 10.8  . Smokeless tobacco: Never Used  Substance and Sexual Activity  . Alcohol use: Yes    Alcohol/week: 1.0 standard drinks    Types: 1 Cans of beer per week    Comment: rarely  . Drug use: No  . Sexual activity: Yes  Lifestyle  . Physical activity:    Days per week: Not on file     Minutes per session: Not on file  . Stress: Not on file  Relationships  . Social connections:    Talks on phone: Not on file    Gets together: Not on file    Attends religious service: Not on file    Active  member of club or organization: Not on file    Attends meetings of clubs or organizations: Not on file    Relationship status: Not on file  . Intimate partner violence:    Fear of current or ex partner: Not on file    Emotionally abused: Not on file    Physically abused: Not on file    Forced sexual activity: Not on file  Other Topics Concern  . Not on file  Social History Narrative   Married.  [name])  No children. Patient has Best boy.   In process of getting disability for low back pain.   Right handed.   Caffeine- Coffee a pot but not every day.   The PMH, PSH, Social History, Family History, Medications, and allergies have been reviewed in [facility], and have been updated if relevant.    Review of Systems  Constitutional: Negative.   HENT: Negative.   Eyes: Negative.   Respiratory: Negative.   Cardiovascular: Negative.   Gastrointestinal: Negative.   Endocrine: Negative.   Genitourinary: Negative.   Musculoskeletal: Negative.   Allergic/Immunologic: Negative.   Neurological: Negative.   Hematological: Negative.   Psychiatric/Behavioral: Negative.   All other systems reviewed and are negative.      Objective:    BP 122/74   Pulse 64   Temp 98.5 F (36.9 C) (Oral)   Ht 6\' 2"  (1.88 m)   Wt 244 lb (110.7 kg)   SpO2 97%   BMI 31.33 kg/m  Wt Readings from Last 3 Encounters:  08/08/18 244 lb (110.7 kg)  07/15/18 241 lb 6.4 oz (109.5 kg)  03/21/18 242 lb 9.6 oz (110 kg)    Physical Exam  Constitutional: He is oriented to person, place, and time. He appears well-developed and well-nourished. No distress.  HENT:  Head: Normocephalic and atraumatic.  Eyes: EOM are normal.  Neck: Normal range of motion. Neck supple.  Cardiovascular: Normal rate and  regular rhythm.  Pulmonary/Chest: Effort normal and breath sounds normal.  Musculoskeletal: Normal range of motion. He exhibits no edema.  Neurological: He is alert and oriented to person, place, and time.  Skin: Skin is warm and dry. He is not diaphoretic.  Psychiatric: He has a normal mood and affect. His behavior is normal. Judgment and thought content normal.  Nursing note and vitals reviewed.         Assessment & Plan:   Essential hypertension  Dizziness Return in about 3 months (around 11/08/2018) for a complete physical., medication follow up.Marland Kitchen

## 2018-08-12 ENCOUNTER — Other Ambulatory Visit: Payer: Self-pay | Admitting: Family Medicine

## 2018-08-20 DIAGNOSIS — G4733 Obstructive sleep apnea (adult) (pediatric): Secondary | ICD-10-CM | POA: Diagnosis not present

## 2018-09-03 DIAGNOSIS — L821 Other seborrheic keratosis: Secondary | ICD-10-CM | POA: Diagnosis not present

## 2018-09-03 DIAGNOSIS — X32XXXD Exposure to sunlight, subsequent encounter: Secondary | ICD-10-CM | POA: Diagnosis not present

## 2018-09-03 DIAGNOSIS — L570 Actinic keratosis: Secondary | ICD-10-CM | POA: Diagnosis not present

## 2018-09-03 DIAGNOSIS — D225 Melanocytic nevi of trunk: Secondary | ICD-10-CM | POA: Diagnosis not present

## 2018-09-26 ENCOUNTER — Other Ambulatory Visit: Payer: Self-pay | Admitting: Family Medicine

## 2018-10-03 ENCOUNTER — Ambulatory Visit (INDEPENDENT_AMBULATORY_CARE_PROVIDER_SITE_OTHER): Payer: PPO | Admitting: Family Medicine

## 2018-10-03 ENCOUNTER — Encounter: Payer: Self-pay | Admitting: Family Medicine

## 2018-10-03 ENCOUNTER — Other Ambulatory Visit: Payer: Self-pay | Admitting: Family Medicine

## 2018-10-03 VITALS — BP 132/80 | HR 64 | Temp 98.4°F | Ht 74.0 in | Wt 247.4 lb

## 2018-10-03 DIAGNOSIS — M5136 Other intervertebral disc degeneration, lumbar region: Secondary | ICD-10-CM

## 2018-10-03 DIAGNOSIS — F1190 Opioid use, unspecified, uncomplicated: Secondary | ICD-10-CM | POA: Insufficient documentation

## 2018-10-03 DIAGNOSIS — M545 Low back pain, unspecified: Secondary | ICD-10-CM | POA: Insufficient documentation

## 2018-10-03 LAB — POCT URINALYSIS DIPSTICK
Bilirubin, UA: NEGATIVE
Blood, UA: NEGATIVE
Glucose, UA: NEGATIVE
Ketones, UA: NEGATIVE
Leukocytes, UA: NEGATIVE
Nitrite, UA: NEGATIVE
Protein, UA: NEGATIVE
Spec Grav, UA: >=1.03 — AB (ref 1.010–1.025)
Urobilinogen, UA: 0.2 E.U./dL
pH, UA: 6 (ref 5.0–8.0)

## 2018-10-03 MED ORDER — CIPROFLOXACIN HCL 250 MG PO TABS: 250.0000 mg | ORAL_TABLET | Freq: Two times a day (BID) | ORAL | 0 refills | Status: DC

## 2018-10-03 NOTE — Assessment & Plan Note (Signed)
Seems MSK in origin, unlikely prostatitis based on symptoms- not acutely ill. Will check UA to rule out UTI/hematuria/pyuria. The patient indicates understanding of these issues and agrees with the plan.

## 2018-10-03 NOTE — Progress Notes (Signed)
Subjective:   Patient ID: Corey Bell, male    DOB: 1966/07/17, 52 y.o.   MRN: [id number]  Corey Bell is a pleasant 52 y.o. year old male who presents to clinic today with Back Pain (Patient is here today C/O LBP.  S/P Disc Replacement L5-S1 on 2.2012.  Update for UDS & CSC needed today and [place] PMP reviewed no red flags.  Last filled Tramadol on 11.25.19 and last filled Percocet on 8.27.19 and is compliant.  States left flank pain since  12.22.19 and he is unsure if it is prostatitis or musculoskeletal.  Takes 2 Flomax daily.  Denies any urinary Sx.)  on 10/03/2018  HPI:  Low back pain-  S/p lumbar disc replacement in 11/2010.  For past 4 days, left flank pain, does radiate down left leg but that is always where his pain radiates since his back surgery. He feels this is different from his typical back pain.  ? UTI or prostatitis. No fevers.  No dysuria.  He has been having trouble urinating for some time (he is on flomax for this). Pain does not radiate to groin.   No hematuria.  Indication for chronic opioid: lumbar disc disease Medication and dose: 1 tablet every 8 hours as needed for pain # pills per month: 30 Last UDS date: 10/03/18 Opioid Treatment Agreement signed (Y/N): Y Opioid Treatment Agreement last reviewed with patient:   NCCSRS reviewed this encounter (include red flags):     Current Outpatient Medications on File Prior to Visit  Medication Sig Dispense Refill  . buPROPion (WELLBUTRIN XL) 300 MG 24 hr tablet Take 1 tablet (300 mg total) by mouth daily. 30 tablet 2  . Cholecalciferol (VITAMIN D3) 5000 units TABS Take 1 tablet by mouth daily.    . citalopram (CELEXA) 40 MG tablet TAKE 1 TABLET BY MOUTH DAILY 30 tablet 3  . cyclobenzaprine (FLEXERIL) 10 MG tablet Take 1 tablet (10 mg total) by mouth at bedtime. 30 tablet 3  . diphenhydrAMINE (BENADRYL) 25 mg capsule Take 50 mg by mouth at bedtime.    Marland Kitchen doxycycline (VIBRAMYCIN) 100 MG capsule Take 100 mg by mouth  daily. For skin issues    . erythromycin with ethanol (THERAMYCIN) 2 % external solution APPLY TOPICALLY TWICE A DAY AS NEEDED 60 mL 2  . ezetimibe (ZETIA) 10 MG tablet Take 1 tablet (10 mg total) by mouth daily. 90 tablet 3  . fexofenadine (ALLEGRA) 180 MG tablet Take 180 mg by mouth daily.    Marland Kitchen gabapentin (NEURONTIN) 800 MG tablet Take 1 tablet (800 mg total) by mouth 4 (four) times daily. 360 tablet 1  . ibuprofen (ADVIL,MOTRIN) 600 MG tablet Take 600 mg by mouth daily.     Marland Kitchen levothyroxine (SYNTHROID, LEVOTHROID) 75 MCG tablet Take 1 tablet (75 mcg total) by mouth daily. 30 tablet 30  . methocarbamol (ROBAXIN) 500 MG tablet TAKE 2 TABLETS BY MOUTH EVERY 6 HOURS ASNEEDED FOR MUSCLE SPASMS 60 tablet 5  . montelukast (SINGULAIR) 10 MG tablet Take 1 tablet (10 mg total) by mouth at bedtime. 90 tablet 3  . oxyCODONE-acetaminophen (PERCOCET/ROXICET) 5-325 MG tablet Take 1 tablet by mouth every 6 (six) hours as needed for severe pain. 30 tablet 0  . pantoprazole (PROTONIX) 40 MG tablet Take 1 tablet (40 mg total) by mouth daily. 90 tablet 3  . Polyvinyl Alcohol (LIQUID TEARS OP) Place 1 drop into both eyes as needed (for dry eyes).    . tamsulosin (FLOMAX) 0.4 MG CAPS  capsule Take 0.8 mg by mouth daily after supper.    . traMADol (ULTRAM) 50 MG tablet TAKE 1 TABLET BY MOUTH EVERY 8 HOURS AS NEEDED FOR MODERATE TO SEVEREPAIN 30 tablet 2  . Trolamine Salicylate (ASPERCREME EX) Apply 1 application topically as needed (for pain).    . vitamin B-12 (CYANOCOBALAMIN) 1000 MCG tablet Take 1 tablet by mouth daily.     No current facility-administered medications on file prior to visit.     Allergies  Allergen Reactions  . Augmentin [Amoxicillin-Pot Clavulanate] Diarrhea  . Demerol [Meperidine] Other (See Comments)    "goes crazy and has the strength of 10 men"  . Eggs Or Egg-Derived Products Diarrhea and Nausea And Vomiting  . Sulfamethoxazole Other (See Comments)    Blisters form in mouth  . Statins  Rash  . Sulfa Antibiotics Rash  . Sulfacetamide Sodium Rash    Past Medical History:  Diagnosis Date  . Anxiety   . Aphthous ulcer 10-04-12   improving  . Carpal tunnel syndrome, bilateral    damaged nerve in neck  . Chronic pain syndrome   . COPD (chronic obstructive pulmonary disease) (HCC)    mild  . Depression   . Diabetes mellitus without complication ([place])    type 2  . Diverticulitis of colon   . Esophageal reflux   . H/O acute prostatitis   . Headache(784.0)    hx of migraines  . Hypothyroidism   . Insomnia, unspecified   . Mixed hyperlipidemia   . Peyronie disease 12-20-12   PER uROLOGY  . Seasonal allergies   . Sleep apnea    uses cpap  . Tympanic membrane perforation    history of  . Unspecified essential hypertension     Past Surgical History:  Procedure Laterality Date  . LAPAROSCOPIC GASTRIC SLEEVE RESECTION N/A 07/31/2017   Procedure: LAPAROSCOPIC GASTRIC SLEEVE RESECTION WITH UPPER ENDOSCOPY;  Surgeon: Alphonsa Overall, MD;  Location: [facility];  Service: General;  Laterality: N/A;  . LumbarSacral Disc Surgery     L5-S1  . MENISCUS REPAIR Left    torn meniscus  . NESBIT PROCEDURE N/A 08/22/2013   Procedure: 16 DOT PLACTATION;  Surgeon: Claybon Jabs, MD;  [facility];  Service: Urology;  Laterality: N/A;  . TYMPANOSTOMY TUBE PLACEMENT  1975  . VASECTOMY Bilateral 08/22/2013   Procedure: VASECTOMY;  Surgeon: Claybon Jabs, MD;  Location: [facility];  Service: Urology;  Laterality: Bilateral;    Family History  Problem Relation Age of Onset  . Congestive Heart Failure Father   . Heart disease Father   . COPD Father   . Renal cancer Paternal Grandfather        renal call carcinoma  . Heart disease Brother        stents at 29 yo  . Bladder Cancer Paternal Uncle   . Renal cancer Paternal Uncle        renal cell carcinoma  . Heart disease Other   . Parkinsonism Neg Hx     Social History   Socioeconomic  History  . Marital status: Married    Spouse name: [name]  . Number of children: 0  . Years of education: college  . Highest education level: Not on file  Occupational History  . 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 Financial resource strain: Not on file  . Food insecurity:    Worry: Not on  file    Inability: Not on file  . Transportation needs:    Medical: Not on file    Non-medical: Not on file  Tobacco Use  . Smoking status: Former Smoker    Packs/day: 1.50    Years: 30.00    Pack years: 45.00    Types: Cigarettes    Last attempt to quit: 10/10/2007    Years since quitting: 10.9  . Smokeless tobacco: Never Used  Substance and Sexual Activity  . Alcohol use: Yes    Alcohol/week: 1.0 standard drinks    Types: 1 Cans of beer per week    Comment: rarely  . Drug use: No  . Sexual activity: Yes  Lifestyle  . Physical activity:    Days per week: Not on file    Minutes per session: Not on file  . Stress: Not on file  Relationships  . Social connections:    Talks on phone: Not on file    Gets together: Not on file    Attends religious service: Not on file    Active member of club or organization: Not on file    Attends meetings of clubs or organizations: Not on file    Relationship status: Not on file  . Intimate partner violence:    Fear of current or ex partner: Not on file    Emotionally abused: Not on file    Physically abused: Not on file    Forced sexual activity: Not on file  Other Topics Concern  . Not on file  Social History Narrative   Married.  [name])  No children. Patient has Best boy.   In process of getting disability for low back pain.   Right handed.   Caffeine- Coffee a pot but not every day.   The PMH, PSH, Social History, Family History, Medications, and allergies have been reviewed in [facility], and have been updated if relevant.   Review of Systems  Constitutional: Positive for chills.  Negative for fever.  HENT: Negative.   Eyes: Negative.   Respiratory: Negative.   Endocrine: Negative.   Genitourinary: Positive for difficulty urinating and flank pain. Negative for decreased urine volume, discharge, dysuria, enuresis, frequency, genital sores, hematuria, penile pain, penile swelling, scrotal swelling, testicular pain and urgency.  Musculoskeletal: Positive for back pain.  Skin: Negative.   Allergic/Immunologic: Negative.   Neurological: Positive for numbness. Negative for dizziness, tremors, seizures, syncope, facial asymmetry, speech difficulty, weakness, light-headedness and headaches.  Psychiatric/Behavioral: Negative.   All other systems reviewed and are negative.      Objective:    BP 132/80 (BP Location: Left Arm, Patient Position: Sitting, Cuff Size: Normal)   Pulse 64   Temp 98.4 F (36.9 C) (Oral)   Ht 6\' 2"  (1.88 m)   Wt 247 lb 6.4 oz (112.2 kg)   SpO2 96%   BMI 31.76 kg/m   Wt Readings from Last 3 Encounters:  10/03/18 247 lb 6.4 oz (112.2 kg)  08/08/18 244 lb (110.7 kg)  07/15/18 241 lb 6.4 oz (109.5 kg)     Physical Exam Vitals signs and nursing note reviewed.  Constitutional:      Appearance: Normal appearance.  HENT:     Head: Normocephalic and atraumatic.  Cardiovascular:     Rate and Rhythm: Normal rate.  Pulmonary:     Effort: Pulmonary effort is normal.  Musculoskeletal:     Lumbar back: He exhibits pain and spasm. He exhibits normal range of motion, no tenderness, no bony tenderness,  no swelling, no edema, no deformity, no laceration and normal pulse.  Skin:    General: Skin is warm and dry.  Neurological:     General: No focal deficit present.     Mental Status: He is alert and oriented to person, place, and time. Mental status is at baseline.     Cranial Nerves: No cranial nerve deficit.     Sensory: No sensory deficit.     Motor: No weakness.     Coordination: Coordination normal.     Gait: Gait normal.     Deep Tendon  Reflexes: Reflexes normal.  Psychiatric:        Mood and Affect: Mood normal.        Behavior: Behavior normal.           Assessment & Plan:   Chronic narcotic use - Plan: Pain Mgmt, Profile 8 w/Conf, U  Acute left-sided low back pain, unspecified whether sciatica present - Plan: POCT urinalysis dipstick, CANCELED: Urinalysis, Routine w reflex microscopic  Lumbar degenerative disc disease No follow-ups on file.

## 2018-10-03 NOTE — Assessment & Plan Note (Signed)
  Indication for chronic opioid: lumbar disc disease Medication and dose: 1 tablet every 8 hours as needed for pain # pills per month: 30 Last UDS date: 10/03/18 Opioid Treatment Agreement signed (Y/N): Y Opioid Treatment Agreement last reviewed with patient:   NCCSRS reviewed this encounter (include red flags):

## 2018-10-04 LAB — PAIN MGMT, PROFILE 8 W/CONF, U
6 Acetylmorphine: NEGATIVE ng/mL (ref <10)
Alcohol Metabolites: NEGATIVE ng/mL (ref <500)
Amphetamines: NEGATIVE ng/mL (ref <500)
BENZODIAZEPINES: NEGATIVE ng/mL (ref <100)
Buprenorphine, Urine: NEGATIVE ng/mL (ref <5)
Cocaine Metabolite: NEGATIVE ng/mL (ref <150)
Creatinine: 120.5 mg/dL
MDMA: NEGATIVE ng/mL (ref <500)
Marijuana Metabolite: NEGATIVE ng/mL (ref <20)
Opiates: NEGATIVE ng/mL (ref <100)
Oxidant: NEGATIVE ug/mL (ref <200)
Oxycodone: NEGATIVE ng/mL (ref <100)
PH: 5.91 (ref 4.5–9.0)

## 2018-10-13 ENCOUNTER — Encounter: Payer: Self-pay | Admitting: Family Medicine

## 2018-10-16 ENCOUNTER — Other Ambulatory Visit: Payer: Self-pay | Admitting: Family Medicine

## 2018-10-16 MED ORDER — TRAMADOL HCL 50 MG PO TABS: ORAL_TABLET | ORAL | 2 refills | Status: DC

## 2018-10-16 NOTE — Progress Notes (Signed)
Subjective:   Patient ID: Corey Bell, male    DOB: 1966/03/26, 53 y.o.   MRN: [id number]  Corey Bell is a pleasant 53 y.o. year old male who presents to clinic today with Follow-up (He states that he felt better while taking Cipro than he does right now and is feeling that it may not have been long enough to get rid of what was going on .  He is here today to recheck BP since it was D/C'ed due to weight loss and dizziness.)  on 10/17/2018  HPI:  Here for follow up.  Saw him on 08/08/18- note reviewed.  Pulse was low and BP remained low so we d/c'd his toprol on 07/15/18.  On 08/08/18, he was doing well without Toprol.  He denied dizziness, HA or fatigue.  BP Readings from Last 3 Encounters:  10/17/18 106/68  10/03/18 132/80  08/08/18 122/74   Gastric sleeve on 07/31/17- doing well. Has been doing well, gained a few pounds during the holidays.  Wt Readings from Last 3 Encounters:  10/17/18 249 lb (112.9 kg)  10/03/18 247 lb 6.4 oz (112.2 kg)  08/08/18 244 lb (110.7 kg)     DM- Stopped taking Metformin. Still checking his FSBS Brings in FSBS fasting- running80s- low 100s.  Lab Results  Component Value Date   HGBA1C 5.9 07/15/2018   Hypothyroidism- taking synthroid 75 mcg daily.  Lab Results  Component Value Date   TSH 2.30 07/15/2018   HLD- taking Zetia 10 mg daily.  Lab Results  Component Value Date   CHOL 162 07/11/2017   HDL 32.30 (L) 07/11/2017   LDLCALC 96 07/11/2017   LDLDIRECT 93.0 04/07/2015   TRIG 166.0 (H) 07/11/2017   CHOLHDL 5 07/11/2017   Depression- symptoms have been controlled with Wellbutrin XL 300 mg daily and Celexa 40 mg daily.  Depression screen Corey Bell 2/9 10/17/2018 09/26/2017 08/15/2017 07/09/2017 06/13/2017  Decreased Interest 0 0 0 0 0  Down, Depressed, Hopeless 0 0 0 0 0  PHQ - 2 Score 0 0 0 0 0   No flowsheet data found.       Current Outpatient Medications on File Prior to Visit  Medication Sig Dispense Refill  .  Cholecalciferol (VITAMIN D3) 5000 units TABS Take 1 tablet by mouth daily.    . citalopram (CELEXA) 40 MG tablet TAKE 1 TABLET BY MOUTH DAILY 30 tablet 3  . diphenhydrAMINE (BENADRYL) 25 mg capsule Take 50 mg by mouth at bedtime.    Marland Kitchen doxycycline (VIBRAMYCIN) 100 MG capsule Take 100 mg by mouth daily. For skin issues    . erythromycin with ethanol (THERAMYCIN) 2 % external solution APPLY TOPICALLY TWICE A DAY AS NEEDED 60 mL 2  . ezetimibe (ZETIA) 10 MG tablet Take 1 tablet (10 mg total) by mouth daily. 90 tablet 3  . fexofenadine (ALLEGRA) 180 MG tablet Take 180 mg by mouth daily.    Marland Kitchen gabapentin (NEURONTIN) 800 MG tablet Take 1 tablet (800 mg total) by mouth 4 (four) times daily. 360 tablet 1  . ibuprofen (ADVIL,MOTRIN) 600 MG tablet Take 600 mg by mouth daily.     . methocarbamol (ROBAXIN) 500 MG tablet TAKE 2 TABLETS BY MOUTH EVERY 6 HOURS ASNEEDED FOR MUSCLE SPASMS 60 tablet 5  . montelukast (SINGULAIR) 10 MG tablet Take 1 tablet (10 mg total) by mouth at bedtime. 90 tablet 3  . oxyCODONE-acetaminophen (PERCOCET/ROXICET) 5-325 MG tablet Take 1 tablet by mouth every 6 (six) hours as needed for  severe pain. 30 tablet 0  . pantoprazole (PROTONIX) 40 MG tablet Take 1 tablet (40 mg total) by mouth daily. 90 tablet 3  . Polyvinyl Alcohol (LIQUID TEARS OP) Place 1 drop into both eyes as needed (for dry eyes).    . tamsulosin (FLOMAX) 0.4 MG CAPS capsule Take 0.8 mg by mouth daily after supper.    [name] Salicylate (ASPERCREME EX) Apply 1 application topically as needed (for pain).    . vitamin B-12 (CYANOCOBALAMIN) 1000 MCG tablet Take 1 tablet by mouth daily.     No current facility-administered medications on file prior to visit.     Allergies  Allergen Reactions  . Augmentin [Amoxicillin-Pot Clavulanate] Diarrhea  . Demerol [Meperidine] Other (See Comments)    "goes crazy and has the strength of 10 men"  . Eggs Or Egg-Derived Products Diarrhea and Nausea And Vomiting  .  Sulfamethoxazole Other (See Comments)    Blisters form in mouth  . Statins Rash  . Sulfa Antibiotics Rash  . Sulfacetamide Sodium Rash    Past Medical History:  Diagnosis Date  . Anxiety   . Aphthous ulcer 10-04-12   improving  . Carpal tunnel syndrome, bilateral    damaged nerve in neck  . Chronic pain syndrome   . COPD (chronic obstructive pulmonary disease) (HCC)    mild  . Depression   . Diabetes mellitus without complication ([place])    type 2  . Diverticulitis of colon   . Esophageal reflux   . H/O acute prostatitis   . Headache(784.0)    hx of migraines  . Hypothyroidism   . Insomnia, unspecified   . Mixed hyperlipidemia   . Peyronie disease 12-20-12   PER uROLOGY  . Seasonal allergies   . Sleep apnea    uses cpap  . Tympanic membrane perforation    history of  . Unspecified essential hypertension     Past Surgical History:  Procedure Laterality Date  . LAPAROSCOPIC GASTRIC SLEEVE RESECTION N/A 07/31/2017   Procedure: LAPAROSCOPIC GASTRIC SLEEVE RESECTION WITH UPPER ENDOSCOPY;  Surgeon: Alphonsa Overall, MD;  Location: [facility];  Service: General;  Laterality: N/A;  . LumbarSacral Disc Surgery     L5-S1  . MENISCUS REPAIR Left    torn meniscus  . NESBIT PROCEDURE N/A 08/22/2013   Procedure: 16 DOT PLACTATION;  Surgeon: Claybon Jabs, MD;  Location: [facility];  Service: Urology;  Laterality: N/A;  . TYMPANOSTOMY TUBE PLACEMENT  1975  . VASECTOMY Bilateral 08/22/2013   Procedure: VASECTOMY;  Surgeon: Claybon Jabs, MD;  Location: [facility];  Service: Urology;  Laterality: Bilateral;    Family History  Problem Relation Age of Onset  . Congestive Heart Failure Father   . Heart disease Father   . COPD Father   . Renal cancer Paternal Grandfather        renal call carcinoma  . Heart disease Brother        stents at 50 yo  . Bladder Cancer Paternal Uncle   . Renal cancer Paternal Uncle        renal cell carcinoma  . Heart  disease Other   . Parkinsonism Neg Hx     Social History   Socioeconomic History  . Marital status: Married    Spouse name: [name]  . Number of children: 0  . Years of education: college  . Highest education level: Not on file  Occupational History  . Occupation: disabled    Fish farm manager: [employer]    Employer: UNEMPLOYED  Social Needs  . Financial resource strain: Not on file  . Food insecurity:    Worry: Not on file    Inability: Not on file  . Transportation needs:    Medical: Not on file    Non-medical: Not on file  Tobacco Use  . Smoking status: Former Smoker    Packs/day: 1.50    Years: 30.00    Pack years: 45.00    Types: Cigarettes    Last attempt to quit: 10/10/2007    Years since quitting: 11.0  . Smokeless tobacco: Never Used  Substance and Sexual Activity  . Alcohol use: Yes    Alcohol/week: 1.0 standard drinks    Types: 1 Cans of beer per week    Comment: rarely  . Drug use: No  . Sexual activity: Yes  Lifestyle  . Physical activity:    Days per week: Not on file    Minutes per session: Not on file  . Stress: Not on file  Relationships  . Social connections:    Talks on phone: Not on file    Gets together: Not on file    Attends religious service: Not on file    Active member of club or organization: Not on file    Attends meetings of clubs or organizations: Not on file    Relationship status: Not on file  . Intimate partner violence:    Fear of current or ex partner: Not on file    Emotionally abused: Not on file    Physically abused: Not on file    Forced sexual activity: Not on file  Other Topics Concern  . Not on file  Social History Narrative   Married.  [name])  No children. Patient has Best boy.   In process of getting disability for low back pain.   Right handed.   Caffeine- Coffee a pot but not every day.   The PMH, PSH, Social History, Family History, Medications, and allergies have been reviewed in [facility], and have been  updated if relevant.    Review of Systems  Constitutional: Negative.   HENT: Negative.   Eyes: Negative.   Respiratory: Negative.   Cardiovascular: Negative.   Gastrointestinal: Negative.   Endocrine: Negative.   Genitourinary: Negative.   Musculoskeletal: Negative.   Allergic/Immunologic: Negative.   Neurological: Negative.   Hematological: Negative.   Psychiatric/Behavioral: Negative.   All other systems reviewed and are negative.      Objective:    BP 106/68 (BP Location: Left Arm, Patient Position: Sitting, Cuff Size: Normal)   Pulse (!) 58   Temp 98.4 F (36.9 C) (Oral)   Ht 6\' 2"  (1.88 m)   Wt 249 lb (112.9 kg)   SpO2 97%   BMI 31.97 kg/m  Wt Readings from Last 3 Encounters:  10/17/18 249 lb (112.9 kg)  10/03/18 247 lb 6.4 oz (112.2 kg)  08/08/18 244 lb (110.7 kg)    Physical Exam Vitals signs and nursing note reviewed.  Constitutional:      General: He is not in acute distress.    Appearance: He is well-developed. He is not diaphoretic.  HENT:     Head: Normocephalic and atraumatic.  Neck:     Musculoskeletal: Normal range of motion and neck supple.  Cardiovascular:     Rate and Rhythm: Normal rate and regular rhythm.  Pulmonary:     Effort: Pulmonary effort is normal.     Breath sounds: Normal breath sounds.  Musculoskeletal: Normal range of motion.  Skin:    General: Skin is warm and dry.  Neurological:     Mental Status: He is alert and oriented to person, place, and time.  Psychiatric:        Behavior: Behavior normal.        Thought Content: Thought content normal.        Judgment: Judgment normal.           Assessment & Plan:   Other specified hypothyroidism - Plan: TSH, T4, free  Depression, unspecified depression type  Mixed hyperlipidemia - Plan: CBC with Differential/Platelet, Comprehensive metabolic panel, Lipid panel  S/P bariatric surgery  Screening for malignant neoplasm of prostate - Plan: PSA No follow-ups on  file.

## 2018-10-17 ENCOUNTER — Encounter: Payer: Self-pay | Admitting: Family Medicine

## 2018-10-17 ENCOUNTER — Ambulatory Visit (INDEPENDENT_AMBULATORY_CARE_PROVIDER_SITE_OTHER): Payer: PPO | Admitting: Family Medicine

## 2018-10-17 VITALS — BP 106/68 | HR 58 | Temp 98.4°F | Ht 74.0 in | Wt 249.0 lb

## 2018-10-17 DIAGNOSIS — Z9884 Bariatric surgery status: Secondary | ICD-10-CM

## 2018-10-17 DIAGNOSIS — E782 Mixed hyperlipidemia: Secondary | ICD-10-CM

## 2018-10-17 DIAGNOSIS — E038 Other specified hypothyroidism: Secondary | ICD-10-CM

## 2018-10-17 DIAGNOSIS — F329 Major depressive disorder, single episode, unspecified: Secondary | ICD-10-CM

## 2018-10-17 DIAGNOSIS — F1190 Opioid use, unspecified, uncomplicated: Secondary | ICD-10-CM | POA: Diagnosis not present

## 2018-10-17 DIAGNOSIS — E119 Type 2 diabetes mellitus without complications: Secondary | ICD-10-CM | POA: Diagnosis not present

## 2018-10-17 DIAGNOSIS — Z125 Encounter for screening for malignant neoplasm of prostate: Secondary | ICD-10-CM | POA: Diagnosis not present

## 2018-10-17 DIAGNOSIS — F32A Depression, unspecified: Secondary | ICD-10-CM

## 2018-10-17 LAB — CBC WITH DIFFERENTIAL/PLATELET
Basophils Absolute: 0.1 10*3/uL (ref 0.0–0.1)
Basophils Relative: 1.2 % (ref 0.0–3.0)
Eosinophils Absolute: 0.2 10*3/uL (ref 0.0–0.7)
Eosinophils Relative: 3.1 % (ref 0.0–5.0)
HCT: 44.9 % (ref 39.0–52.0)
Hemoglobin: 15.5 g/dL (ref 13.0–17.0)
Lymphocytes Relative: 29.9 % (ref 12.0–46.0)
Lymphs Abs: 1.6 10*3/uL (ref 0.7–4.0)
MCHC: 34.5 g/dL (ref 30.0–36.0)
MCV: 91.9 fl (ref 78.0–100.0)
Monocytes Absolute: 0.4 10*3/uL (ref 0.1–1.0)
Monocytes Relative: 8.2 % (ref 3.0–12.0)
Neutro Abs: 3.1 10*3/uL (ref 1.4–7.7)
Neutrophils Relative %: 57.6 % (ref 43.0–77.0)
Platelets: 194 10*3/uL (ref 150.0–400.0)
RBC: 4.89 Mil/uL (ref 4.22–5.81)
RDW: 12.8 % (ref 11.5–15.5)
WBC: 5.3 10*3/uL (ref 4.0–10.5)

## 2018-10-17 LAB — COMPREHENSIVE METABOLIC PANEL
ALK PHOS: 62 U/L (ref 39–117)
ALT: 19 U/L (ref 0–53)
AST: 18 U/L (ref 0–37)
Albumin: 4.4 g/dL (ref 3.5–5.2)
BUN: 18 mg/dL (ref 6–23)
CO2: 29 mEq/L (ref 19–32)
Calcium: 9.5 mg/dL (ref 8.4–10.5)
Chloride: 103 mEq/L (ref 96–112)
Creatinine, Ser: 1.13 mg/dL (ref 0.40–1.50)
GFR: 72.43 mL/min (ref >60.00)
Glucose, Bld: 99 mg/dL (ref 70–99)
Potassium: 4.7 mEq/L (ref 3.5–5.1)
Sodium: 140 mEq/L (ref 135–145)
TOTAL PROTEIN: 6.8 g/dL (ref 6.0–8.3)
Total Bilirubin: 1.6 mg/dL — ABNORMAL HIGH (ref 0.2–1.2)

## 2018-10-17 LAB — LIPID PANEL
Cholesterol: 156 mg/dL (ref 0–200)
HDL: 35.8 mg/dL — ABNORMAL LOW (ref >39.00)
LDL Cholesterol: 82 mg/dL (ref 0–99)
NonHDL: 120.5
Total CHOL/HDL Ratio: 4
Triglycerides: 191 mg/dL — ABNORMAL HIGH (ref 0.0–149.0)
VLDL: 38.2 mg/dL (ref 0.0–40.0)

## 2018-10-17 LAB — T4, FREE: Free T4: 0.78 ng/dL (ref 0.60–1.60)

## 2018-10-17 LAB — PSA: PSA: 0.39 ng/mL (ref 0.10–4.00)

## 2018-10-17 LAB — TSH: TSH: 1 u[IU]/mL (ref 0.35–4.50)

## 2018-10-17 MED ORDER — LEVOTHYROXINE SODIUM 75 MCG PO TABS: 75.0000 ug | ORAL_TABLET | Freq: Every day | ORAL | 5 refills | Status: DC

## 2018-10-17 MED ORDER — CIPROFLOXACIN HCL 250 MG PO TABS: 250.0000 mg | ORAL_TABLET | Freq: Two times a day (BID) | ORAL | 0 refills | Status: DC

## 2018-10-17 MED ORDER — TRAMADOL HCL 50 MG PO TABS: ORAL_TABLET | ORAL | 5 refills | Status: DC

## 2018-10-17 MED ORDER — CYCLOBENZAPRINE HCL 10 MG PO TABS: 10.0000 mg | ORAL_TABLET | Freq: Every day | ORAL | 5 refills | Status: DC

## 2018-10-17 MED ORDER — BUPROPION HCL ER (XL) 300 MG PO TB24: 300.0000 mg | ORAL_TABLET | Freq: Every day | ORAL | 5 refills | Status: DC

## 2018-10-17 NOTE — Assessment & Plan Note (Signed)
Well controlled.  Depression screen reviewed today and is reassuring.  No changes made to rxs.

## 2018-10-17 NOTE — Assessment & Plan Note (Signed)
Indication for chronic opioid: severe lumbar DDD Medication and dose: 1 tablet every 8 hours as needed for pain # pills per month: 30 Last UDS date: 10/03/18 Opioid Treatment Agreement signed (Y/N): y Opioid Treatment Agreement last reviewed with patient:  10/03/18 NCCSRS reviewed this encounter (include red flags):  10/16/18- no red flags.

## 2018-10-17 NOTE — Patient Instructions (Signed)
Great to see you. I will call you with your lab results from today and you can view them online.   I will see you next month for your physical.

## 2018-10-17 NOTE — Assessment & Plan Note (Signed)
Remains diet controlled. No changes made today.

## 2018-10-17 NOTE — Assessment & Plan Note (Signed)
Doing well on current of synthroid. Check labs today Has CPX scheduled with me next month.

## 2018-10-17 NOTE — Assessment & Plan Note (Signed)
Well controlled without rxs. No changes made to rxs.

## 2018-10-17 NOTE — Assessment & Plan Note (Signed)
Remains diet controlled.

## 2018-10-28 ENCOUNTER — Encounter: Payer: Self-pay | Admitting: Family Medicine

## 2018-11-08 DIAGNOSIS — Z0000 Encounter for general adult medical examination without abnormal findings: Secondary | ICD-10-CM | POA: Insufficient documentation

## 2018-11-11 ENCOUNTER — Ambulatory Visit (INDEPENDENT_AMBULATORY_CARE_PROVIDER_SITE_OTHER): Payer: PPO | Admitting: Family Medicine

## 2018-11-11 ENCOUNTER — Encounter: Payer: Self-pay | Admitting: Family Medicine

## 2018-11-11 VITALS — BP 122/76 | HR 69 | Temp 98.2°F | Ht 74.0 in | Wt 246.6 lb

## 2018-11-11 DIAGNOSIS — G894 Chronic pain syndrome: Secondary | ICD-10-CM

## 2018-11-11 DIAGNOSIS — E038 Other specified hypothyroidism: Secondary | ICD-10-CM

## 2018-11-11 DIAGNOSIS — F1190 Opioid use, unspecified, uncomplicated: Secondary | ICD-10-CM | POA: Diagnosis not present

## 2018-11-11 DIAGNOSIS — F32A Depression, unspecified: Secondary | ICD-10-CM

## 2018-11-11 DIAGNOSIS — Z9884 Bariatric surgery status: Secondary | ICD-10-CM | POA: Diagnosis not present

## 2018-11-11 DIAGNOSIS — E782 Mixed hyperlipidemia: Secondary | ICD-10-CM

## 2018-11-11 DIAGNOSIS — E119 Type 2 diabetes mellitus without complications: Secondary | ICD-10-CM

## 2018-11-11 DIAGNOSIS — F329 Major depressive disorder, single episode, unspecified: Secondary | ICD-10-CM | POA: Diagnosis not present

## 2018-11-11 DIAGNOSIS — Z0000 Encounter for general adult medical examination without abnormal findings: Secondary | ICD-10-CM | POA: Diagnosis not present

## 2018-11-11 DIAGNOSIS — I10 Essential (primary) hypertension: Secondary | ICD-10-CM | POA: Diagnosis not present

## 2018-11-11 DIAGNOSIS — G4733 Obstructive sleep apnea (adult) (pediatric): Secondary | ICD-10-CM

## 2018-11-11 MED ORDER — GABAPENTIN 800 MG PO TABS: 800.0000 mg | ORAL_TABLET | Freq: Four times a day (QID) | ORAL | 11 refills | Status: DC

## 2018-11-11 MED ORDER — MONTELUKAST SODIUM 10 MG PO TABS: 10.0000 mg | ORAL_TABLET | Freq: Every day | ORAL | 3 refills | Status: DC

## 2018-11-11 MED ORDER — TRAMADOL HCL 50 MG PO TABS: ORAL_TABLET | ORAL | 5 refills | Status: DC

## 2018-11-11 MED ORDER — METHOCARBAMOL 500 MG PO TABS: ORAL_TABLET | ORAL | 5 refills | Status: DC

## 2018-11-11 MED ORDER — ASPIRIN EC 81 MG PO TBEC: 81.0000 mg | DELAYED_RELEASE_TABLET | Freq: Every day | ORAL | 3 refills | Status: DC

## 2018-11-11 MED ORDER — PANTOPRAZOLE SODIUM 40 MG PO TBEC: 40.0000 mg | DELAYED_RELEASE_TABLET | Freq: Every day | ORAL | 3 refills | Status: DC

## 2018-11-11 MED ORDER — OXYCODONE-ACETAMINOPHEN 5-325 MG PO TABS: 1.0000 | ORAL_TABLET | Freq: Four times a day (QID) | ORAL | 0 refills | Status: DC | PRN

## 2018-11-11 MED ORDER — GABAPENTIN 800 MG PO TABS: 800.0000 mg | ORAL_TABLET | Freq: Three times a day (TID) | ORAL | 11 refills | Status: DC

## 2018-11-11 MED ORDER — LEVOTHYROXINE SODIUM 75 MCG PO TABS: 75.0000 ug | ORAL_TABLET | Freq: Every day | ORAL | 11 refills | Status: DC

## 2018-11-11 NOTE — Assessment & Plan Note (Addendum)
Diet controlled.  D/c Zetia. Follow up in 3 months.

## 2018-11-11 NOTE — Patient Instructions (Addendum)
Great to see you.  We are STOPPING Zetia.  We are decreasing your aspirin to 81 mg daily . We are decreasing your gabapentin to 800 mg three times daily.   Referring you to have a home sleep study to see if you still need CPAP.  Make an appointment with Dr. Raeford Razor on your way out.  Follow up with me in 2 months.

## 2018-11-11 NOTE — Assessment & Plan Note (Signed)
Reviewed preventive care protocols, scheduled due services, and updated immunizations Discussed nutrition, exercise, diet, and healthy lifestyle.  

## 2018-11-11 NOTE — Assessment & Plan Note (Signed)
Diet controlled s/p bariatric surgery.

## 2018-11-11 NOTE — Assessment & Plan Note (Signed)
Euthyroid.  No changes made.

## 2018-11-11 NOTE — Assessment & Plan Note (Signed)
Well controlled off rxs. No changes made today.

## 2018-11-11 NOTE — Assessment & Plan Note (Signed)
Total time spent with patient was 65 minutes, with 25 minutes spent specifically on problem visit concerning his other medical problems including diabetes, hypotension, HLD, chronic pain.  D/c ASA 325 mg daily due to risk of GI bleed and brain bleed. He does feel ASA helps with pain- advised ASA 81 mg daily.

## 2018-11-11 NOTE — Assessment & Plan Note (Signed)
Well controlled.  No changes made. 

## 2018-11-11 NOTE — Assessment & Plan Note (Signed)
Not sure if  He still needs his CPAP after weight loss.  Will refer him for home sleep study.

## 2018-11-11 NOTE — Progress Notes (Signed)
Subjective:   Patient ID: Corey Bell, male    DOB: May 12, 1966, 53 y.o.   MRN: [id number]  Corey Bell is a pleasant 53 y.o. year old male who presents to clinic today with Annual Exam (Patient is here today for a CPE.  He is UTD on Immunizations.  Labs completed on 1.9.2020.)  on 11/11/2018  HPI:   Health Maintenance  Topic Date Due  . INFLUENZA VACCINE  05/17/2019 (Originally 05/09/2018)  . HEMOGLOBIN A1C  01/14/2019  . OPHTHALMOLOGY EXAM  03/16/2019  . FOOT EXAM  08/09/2019  . Fecal DNA (Cologuard)  02/01/2020  . TETANUS/TDAP  12/14/2027  . PNEUMOCOCCAL POLYSACCHARIDE VACCINE AGE 82-64 HIGH RISK  Completed  . HIV Screening  Completed   Depression- symptoms have been controlled with Wellbutrin XL 300 mg daily and Celexa 40 mg daily.  Depression screen [facility] 2/9 11/11/2018 10/17/2018 09/26/2017 08/15/2017 07/09/2017  Decreased Interest 0 0 0 0 0  Down, Depressed, Hopeless 0 0 0 0 0  PHQ - 2 Score 0 0 0 0 0  Difficult doing work/chores Not difficult at all - - - -     HLD- taking Zetia 10 mg daily. Lab Results  Component Value Date   CHOL 156 10/17/2018   HDL 35.80 (L) 10/17/2018   LDLCALC 82 10/17/2018   LDLDIRECT 93.0 04/07/2015   TRIG 191.0 (H) 10/17/2018   CHOLHDL 4 10/17/2018    Diabetes has been diet controlled since bariatric surgery.  Lab Results  Component Value Date   HGBA1C 5.9 07/15/2018    Hypothyroidism- on synthroid 75 mcg daily.  Lab Results  Component Value Date   TSH 1.00 10/17/2018     HTN- we have been weaning his blood pressure medications and diabetes medications down as he has lost weight.  Currently taking no longer taking any rxs for diabetes or HTN. He has occasional PVCs and a strong FH of premature CAD.  The 10-year ASCVD risk score Mikey Bussing DC Brooke Bonito., et al., 2013) is: 7.4%   Values used to calculate the score:     Age: 53 years     Sex: Male     Is Non-Hispanic African American: No     Diabetic: Yes     Tobacco smoker: No  Systolic Blood Pressure: 557 mmHg     Is BP treated: No     HDL Cholesterol: 35.8 mg/dL     Total Cholesterol: 156 mg/dL   Urinary hesitancy followed by Dr. Consuella Lose.  Improved when he started him on flomax but over the past couple month, symptoms have returned.  No dysuria or other urinary symptoms.  Chronic pain-  Indication for chronic opioid: severe lumbar DDD Medication and dose: 1 tablet every 8 hours as needed for pain # pills per month: 30 Last UDS date: 10/03/18 Opioid Treatment Agreement signed (Y/N): y Opioid Treatment Agreement last reviewed with patient:  10/03/18 NCCSRS reviewed this encounter (include red flags):  10/16/18- no red flags.  He is currently talking ASA 325 mg daily for pain.  No PMH of CVA or other vascular events. Also, wife has noticed  Difficulty with word recall.  Current Outpatient Medications on File Prior to Visit  Medication Sig Dispense Refill  . aspirin 325 MG tablet Take 2 tablets by mouth daily.    Marland Kitchen buPROPion (WELLBUTRIN XL) 300 MG 24 hr tablet Take 1 tablet (300 mg total) by mouth daily. 30 tablet 5  . citalopram (CELEXA) 40 MG tablet TAKE 1 TABLET BY MOUTH  DAILY 30 tablet 3  . cyclobenzaprine (FLEXERIL) 10 MG tablet Take 1 tablet (10 mg total) by mouth at bedtime. 30 tablet 5  . diphenhydrAMINE (BENADRYL) 25 mg capsule Take 50 mg by mouth at bedtime.    Marland Kitchen doxycycline (VIBRAMYCIN) 100 MG capsule Take 100 mg by mouth daily. For skin issues    . erythromycin with ethanol (THERAMYCIN) 2 % external solution APPLY TOPICALLY TWICE A DAY AS NEEDED 60 mL 2  . ezetimibe (ZETIA) 10 MG tablet Take 1 tablet (10 mg total) by mouth daily. 90 tablet 3  . fexofenadine (ALLEGRA) 180 MG tablet Take 180 mg by mouth daily.    Marland Kitchen ibuprofen (ADVIL,MOTRIN) 600 MG tablet Take 600 mg by mouth daily.     . phenylephrine (EQ SUPHEDRINE PE) 10 MG TABS tablet 1q4-6 h prn    . Polyvinyl Alcohol (LIQUID TEARS OP) Place 1 drop into both eyes as needed (for dry eyes).    .  Saw Palmetto 450 MG CAPS Take 2 capsules by mouth daily.    . tamsulosin (FLOMAX) 0.4 MG CAPS capsule Take 0.8 mg by mouth daily after supper.    . Thiamine HCl (VITAMIN B-1) 250 MG tablet Take 1 tablet by mouth daily.    [name] Salicylate (ASPERCREME EX) Apply 1 application topically as needed (for pain).    . vitamin B-12 (CYANOCOBALAMIN) 1000 MCG tablet Take 1 tablet by mouth daily.     No current facility-administered medications on file prior to visit.     Allergies  Allergen Reactions  . Augmentin [Amoxicillin-Pot Clavulanate] Diarrhea  . Demerol [Meperidine] Other (See Comments)    "goes crazy and has the strength of 10 men"  . Eggs Or Egg-Derived Products Diarrhea and Nausea And Vomiting  . Sulfamethoxazole Other (See Comments)    Blisters form in mouth  . Statins Rash  . Sulfa Antibiotics Rash  . Sulfacetamide Sodium Rash    Past Medical History:  Diagnosis Date  . Anxiety   . Aphthous ulcer 10-04-12   improving  . Carpal tunnel syndrome, bilateral    damaged nerve in neck  . Chronic pain syndrome   . COPD (chronic obstructive pulmonary disease) (HCC)    mild  . Depression   . Diabetes mellitus without complication ([place])    type 2  . Diverticulitis of colon   . Esophageal reflux   . H/O acute prostatitis   . Headache(784.0)    hx of migraines  . Hypothyroidism   . Insomnia, unspecified   . Mixed hyperlipidemia   . Peyronie disease 12-20-12   PER uROLOGY  . Seasonal allergies   . Sleep apnea    uses cpap  . Tympanic membrane perforation    history of  . Unspecified essential hypertension     Past Surgical History:  Procedure Laterality Date  . LAPAROSCOPIC GASTRIC SLEEVE RESECTION N/A 07/31/2017   Procedure: LAPAROSCOPIC GASTRIC SLEEVE RESECTION WITH UPPER ENDOSCOPY;  Surgeon: Alphonsa Overall, MD;  Location: [facility];  Service: General;  Laterality: N/A;  . LumbarSacral Disc Surgery     L5-S1  . MENISCUS REPAIR Left    torn meniscus  . NESBIT  PROCEDURE N/A 08/22/2013   Procedure: 16 DOT PLACTATION;  Surgeon: Claybon Jabs, MD;  Location: [facility];  Service: Urology;  Laterality: N/A;  . TYMPANOSTOMY TUBE PLACEMENT  1975  . VASECTOMY Bilateral 08/22/2013   Procedure: VASECTOMY;  Surgeon: Claybon Jabs, MD;  Location: [facility];  Service: Urology;  Laterality: Bilateral;    Family History  Problem Relation Age of Onset  . Congestive Heart Failure Father   . Heart disease Father   . COPD Father   . Renal cancer Paternal Grandfather        renal call carcinoma  . Heart disease Brother        stents at 30 yo  . Bladder Cancer Paternal Uncle   . Renal cancer Paternal Uncle        renal cell carcinoma  . Heart disease Other   . Parkinsonism Neg Hx     Social History   Socioeconomic History  . Marital status: Married    Spouse name: [name]  . Number of children: 0  . Years of education: college  . Highest education level: Not on file  Occupational History  . 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 Financial resource strain: Not on file  . Food insecurity:    Worry: Not on file    Inability: Not on file  . Transportation needs:    Medical: Not on file    Non-medical: Not on file  Tobacco Use  . Smoking status: Former Smoker    Packs/day: 1.50    Years: 30.00    Pack years: 45.00    Types: Cigarettes    Last attempt to quit: 10/10/2007    Years since quitting: 11.0  . Smokeless tobacco: Never Used  Substance and Sexual Activity  . Alcohol use: Yes    Alcohol/week: 1.0 standard drinks    Types: 1 Cans of beer per week    Comment: rarely  . Drug use: No  . Sexual activity: Yes  Lifestyle  . Physical activity:    Days per week: Not on file    Minutes per session: Not on file  . Stress: Not on file  Relationships  . Social connections:    Talks on phone: Not on file    Gets together: Not on file    Attends religious  service: Not on file    Active member of club or organization: Not on file    Attends meetings of clubs or organizations: Not on file    Relationship status: Not on file  . Intimate partner violence:    Fear of current or ex partner: Not on file    Emotionally abused: Not on file    Physically abused: Not on file    Forced sexual activity: Not on file  Other Topics Concern  . Not on file  Social History Narrative   Married.  [name])  No children. Patient has Best boy.   In process of getting disability for low back pain.   Right handed.   Caffeine- Coffee a pot but not every day.   The PMH, PSH, Social History, Family History, Medications, and allergies have been reviewed in [facility], and have been updated if relevant.   Review of Systems  Constitutional: Negative.   HENT: Negative.   Eyes: Negative.   Respiratory: Negative.   Gastrointestinal: Negative.   Endocrine: Negative.   Genitourinary: Negative.   Musculoskeletal: Positive for arthralgias and back pain.  Allergic/Immunologic: Negative.   Neurological: Negative.   Hematological: Negative.   Psychiatric/Behavioral: Negative.        Objective:    BP 122/76 (BP Location: Left Arm, Patient Position: Sitting, Cuff Size: Normal)   Pulse 69   Temp 98.2 F (36.8 C) (Oral)   Ht  6\' 2"  (1.88 m)   Wt 246 lb 9.6 oz (111.9 kg)   SpO2 97%   BMI 31.66 kg/m   Wt Readings from Last 3 Encounters:  11/11/18 246 lb 9.6 oz (111.9 kg)  10/17/18 249 lb (112.9 kg)  10/03/18 247 lb 6.4 oz (112.2 kg)    Physical Exam General:  pleasant male in no acute distress Eyes:  PERRL Ears:  External ear exam shows no significant lesions or deformities.  TMs normal bilaterally Hearing is grossly normal bilaterally. Nose:  External nasal examination shows no deformity or inflammation. Nasal mucosa are pink and moist without lesions or exudates. Mouth:  Oral mucosa and oropharynx without lesions or exudates.  Teeth in good  repair. Neck:  no carotid bruit or thyromegaly no cervical or supraclavicular lymphadenopathy  Lungs:  Normal respiratory effort, chest expands symmetrically. Lungs are clear to auscultation, no crackles or wheezes. Heart:  Normal rate and regular rhythm. S1 and S2 normal without gallop, murmur, click, rub or other extra sounds. Abdomen:  Bowel sounds positive,abdomen soft and non-tender without masses, organomegaly or hernias noted. Pulses:  R and L posterior tibial pulses are full and equal bilaterally  Extremities:  no edema  Psych:  Good eye contact, not anxious or depressed appearing        Assessment & Plan:   Chronic pain syndrome  Visit for well man health check  Chronic narcotic use  Depression, unspecified depression type  Diabetes mellitus type 2, diet-controlled ([place])  Essential hypertension  S/P bariatric surgery  Other specified hypothyroidism  Mixed hyperlipidemia No follow-ups on file.

## 2018-11-18 ENCOUNTER — Ambulatory Visit (INDEPENDENT_AMBULATORY_CARE_PROVIDER_SITE_OTHER): Payer: PPO | Admitting: Family Medicine

## 2018-11-18 ENCOUNTER — Encounter: Payer: Self-pay | Admitting: Family Medicine

## 2018-11-18 VITALS — BP 138/82 | HR 66 | Temp 98.0°F | Ht 74.0 in | Wt 251.0 lb

## 2018-11-18 DIAGNOSIS — M546 Pain in thoracic spine: Secondary | ICD-10-CM

## 2018-11-18 NOTE — Progress Notes (Signed)
Corey Bell - 53 y.o. male MRN [id number]  Date of birth: Feb 19, 1966  SUBJECTIVE:  Including CC & ROS.  Chief Complaint  Patient presents with  . Pain    right shoulder pain/ 14 years/no injury-- constant pain/ lump on spin/ fell in floor board of car/ 5 years ago    Corey Bell is a 53 y.o. male that is presenting with acute on chronic and worsening right thoracic back pain.  He feels this pain on the medial aspect of the scapular border.  He has had a significant work-up about 10 years ago that resulted in no identified structure.  He feels like the pain is been ongoing for 14 years altogether.  He has no pain with movement or loss of strength.  He feels the pain when he is just sitting at his computer desk.  He feels it is more of an uncomfortable sensation as opposed to significant pain.  It is localized to this area.  He has tried gabapentin and is currently on tramadol and has oxycodone as needed.  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  Denies any numbness or tingling.  The symptoms are intermittent.    Review of Systems  Constitutional: Negative for fever.  HENT: Negative for congestion.   Respiratory: Negative for cough.   Cardiovascular: Negative for chest pain.  Gastrointestinal: Negative for abdominal pain.  Musculoskeletal: Positive for back pain.  Skin: Negative for color change.  Neurological: Negative for weakness.  Hematological: Negative for adenopathy.    HISTORY: Past Medical, Surgical, Social, and Family History Reviewed & Updated per EMR.   Pertinent Historical Findings include:  Past Medical History:  Diagnosis Date  . Anxiety   . Aphthous ulcer 10-04-12   improving  . Carpal tunnel syndrome, bilateral    damaged nerve in neck  . Chronic pain syndrome   . COPD (chronic obstructive pulmonary disease) (HCC)    mild  . Depression   . Diabetes mellitus without complication ([place])    type 2  .  Diverticulitis of colon   . Esophageal reflux   . H/O acute prostatitis   . Headache(784.0)    hx of migraines  . Hypothyroidism   . Insomnia, unspecified   . Mixed hyperlipidemia   . Peyronie disease 12-20-12   PER uROLOGY  . Seasonal allergies   . Sleep apnea    uses cpap  . Tympanic membrane perforation    history of  . Unspecified essential hypertension     Past Surgical History:  Procedure Laterality Date  . LAPAROSCOPIC GASTRIC SLEEVE RESECTION N/A 07/31/2017   Procedure: LAPAROSCOPIC GASTRIC SLEEVE RESECTION WITH UPPER ENDOSCOPY;  Surgeon: Alphonsa Overall, MD;  Location: [facility];  Service: General;  Laterality: N/A;  . LumbarSacral Disc Surgery     L5-S1  . MENISCUS REPAIR Left    torn meniscus  . NESBIT PROCEDURE N/A 08/22/2013   Procedure: 16 DOT PLACTATION;  Surgeon: Claybon Jabs, MD;  Location: [facility];  Service: Urology;  Laterality: N/A;  . TYMPANOSTOMY TUBE PLACEMENT  1975  . VASECTOMY Bilateral 08/22/2013   Procedure: VASECTOMY;  Surgeon: Claybon Jabs, MD;  Location: [facility];  Service: Urology;  Laterality: Bilateral;    Allergies  Allergen Reactions  . Augmentin [Amoxicillin-Pot Clavulanate] Diarrhea  . Demerol [Meperidine] Other (See Comments)    "goes crazy and has the strength of 10 men"  . Eggs Or Egg-Derived Products  Diarrhea and Nausea And Vomiting  . Sulfamethoxazole Other (See Comments)    Blisters form in mouth  . Statins Rash  . Sulfa Antibiotics Rash  . Sulfacetamide Sodium Rash    Family History  Problem Relation Age of Onset  . Congestive Heart Failure Father   . Heart disease Father   . COPD Father   . Renal cancer Paternal Grandfather        renal call carcinoma  . Heart disease Brother        stents at 84 yo  . Bladder Cancer Paternal Uncle   . Renal cancer Paternal Uncle        renal cell carcinoma  . Heart disease Other   . Parkinsonism Neg Hx      Social History   Socioeconomic  History  . Marital status: Married    Spouse name: Corey Bell  . Number of children: 0  . Years of education: college  . Highest education level: Not on file  Occupational History  . 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 Financial resource strain: Not on file  . Food insecurity:    Worry: Not on file    Inability: Not on file  . Transportation needs:    Medical: Not on file    Non-medical: Not on file  Tobacco Use  . Smoking status: Former Smoker    Packs/day: 1.50    Years: 30.00    Pack years: 45.00    Types: Cigarettes    Last attempt to quit: 10/10/2007    Years since quitting: 11.1  . Smokeless tobacco: Never Used  Substance and Sexual Activity  . Alcohol use: Yes    Alcohol/week: 1.0 standard drinks    Types: 1 Cans of beer per week    Comment: rarely  . Drug use: No  . Sexual activity: Yes  Lifestyle  . Physical activity:    Days per week: Not on file    Minutes per session: Not on file  . Stress: Not on file  Relationships  . Social connections:    Talks on phone: Not on file    Gets together: Not on file    Attends religious service: Not on file    Active member of club or organization: Not on file    Attends meetings of clubs or organizations: Not on file    Relationship status: Not on file  . Intimate partner violence:    Fear of current or ex partner: Not on file    Emotionally abused: Not on file    Physically abused: Not on file    Forced sexual activity: Not on file  Other Topics Concern  . Not on file  Social History Narrative   Married.  Corey Bell)  No children. Patient has Best boy.   In process of getting disability for low back pain.   Right handed.   Caffeine- Coffee a pot but not every day.     PHYSICAL EXAM:  VS: BP 138/82   Pulse 66   Temp 98 F (36.7 C) (Oral)   Ht 6\' 2"  (1.88 m)   Wt 251 lb (113.9 kg)   SpO2 97%   BMI 32.23 kg/m  Physical Exam Gen: NAD, alert,  cooperative with exam, well-appearing ENT: normal lips, normal nasal mucosa,  Eye: normal EOM, normal conjunctiva and lids CV:  no edema, +2 pedal pulses   Resp: no accessory muscle use, non-labored,  Skin: no rashes, no areas of induration  Neuro: normal tone, normal sensation to touch Psych:  normal insight, alert and oriented MSK:  Back/right shoulder: Some tenderness to palpation of the rhomboids on the medial aspect of the scapular border. No winging of the scapula. No signs of atrophy. Normal active flexion and abduction of the right shoulder. Normal strength resistance with internal and external rotation. Normal empty can testing. Neurovascular intact     ASSESSMENT & PLAN:   Acute right-sided thoracic back pain Pain seems to be more of a nerve irritant as opposed to functional problem.  Could be arthritic change in the thoracic spine.  No winging of the scapula appreciated.  Could be associated with complex regional pain syndrome.  Has tried several chiropractors and different injections in the past.  Has had an MRI completed about 10 years ago. -Provided Pennsaid samples -Counseled on Aspercreme with lidocaine and salon pause. -Counseled on home exercise therapy and provided Thera-Band. -If no improvement consider thoracic imaging and consider starting nortriptyline

## 2018-11-18 NOTE — Assessment & Plan Note (Signed)
Pain seems to be more of a nerve irritant as opposed to functional problem.  Could be arthritic change in the thoracic spine.  No winging of the scapula appreciated.  Could be associated with complex regional pain syndrome.  Has tried several chiropractors and different injections in the past.  Has had an MRI completed about 10 years ago. -Provided Pennsaid samples -Counseled on Aspercreme with lidocaine and salon pause. -Counseled on home exercise therapy and provided Thera-Band. -If no improvement consider thoracic imaging and consider starting nortriptyline

## 2018-11-18 NOTE — Patient Instructions (Signed)
Nice to meet you  Please try aspercreame with lidocaine or salonpas.  Please try the rub on medicine  Please try the exercises 1-2 times daily  Please see me back in 3-4 weeks.

## 2018-11-19 ENCOUNTER — Institutional Professional Consult (permissible substitution): Payer: Self-pay | Admitting: Internal Medicine

## 2018-11-26 ENCOUNTER — Encounter: Payer: Self-pay | Admitting: Family Medicine

## 2018-11-26 ENCOUNTER — Institutional Professional Consult (permissible substitution): Payer: Self-pay | Admitting: Internal Medicine

## 2018-12-16 ENCOUNTER — Ambulatory Visit: Payer: PPO | Admitting: Family Medicine

## 2019-01-07 ENCOUNTER — Telehealth: Payer: Self-pay
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
## 2019-01-10 DIAGNOSIS — S63641A Sprain of metacarpophalangeal joint of right thumb, initial encounter: Secondary | ICD-10-CM | POA: Diagnosis not present

## 2019-01-13 ENCOUNTER — Ambulatory Visit: Payer: Self-pay | Admitting: Family Medicine

## 2019-01-20 ENCOUNTER — Other Ambulatory Visit: Payer: Self-pay | Admitting: Family Medicine

## 2019-01-21 ENCOUNTER — Encounter: Payer: Self-pay | Admitting: Family Medicine

## 2019-01-21 NOTE — Telephone Encounter (Signed)
Spoke with patient about appointment information and he states that he does not currently have any problems at this time and would really like to just wait until this virus is over. He reports that at last visit he was told to just follow up as needed so he really is doing good and is willing to just wait. Reviewed that he would need to just give [place] a call when he is comfortable scheduling return appointment and we will be happy to see him. Advised that if he should have any changes or concerns to please call back as well and we will be happy to set him up with a virtual visit. He verbalized understanding with no further questions at this time.
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
## 2019-01-27 ENCOUNTER — Encounter: Payer: Self-pay | Admitting: Family Medicine

## 2019-01-27 ENCOUNTER — Ambulatory Visit (INDEPENDENT_AMBULATORY_CARE_PROVIDER_SITE_OTHER): Payer: PPO | Admitting: Family Medicine

## 2019-01-27 DIAGNOSIS — F4311 Post-traumatic stress disorder, acute: Secondary | ICD-10-CM

## 2019-01-27 DIAGNOSIS — S6991XD Unspecified injury of right wrist, hand and finger(s), subsequent encounter: Secondary | ICD-10-CM

## 2019-01-27 DIAGNOSIS — S6991XA Unspecified injury of right wrist, hand and finger(s), initial encounter: Secondary | ICD-10-CM | POA: Insufficient documentation

## 2019-01-27 NOTE — Assessment & Plan Note (Signed)
New- swelling and LROM persistent.  Referral placed to hand specialist.

## 2019-01-27 NOTE — Assessment & Plan Note (Addendum)
>  25 minutes spent in face to face time with patient, >50% spent in counselling or coordination of care discussing PTSD and thumb injury. Discussed minipress - he would like to hold off at this time.  He will try melatonin.  Benadryl has helped in past and he will try this as well to help with insomnia. Denies SI or HI.   He will update me in a few weeks.

## 2019-02-03 ENCOUNTER — Ambulatory Visit: Payer: Self-pay | Admitting: Cardiovascular Disease

## 2019-02-04 DIAGNOSIS — S6991XA Unspecified injury of right wrist, hand and finger(s), initial encounter: Secondary | ICD-10-CM | POA: Diagnosis not present

## 2019-02-04 DIAGNOSIS — M79644 Pain in right finger(s): Secondary | ICD-10-CM | POA: Diagnosis not present

## 2019-02-07 DIAGNOSIS — T1591XA Foreign body on external eye, part unspecified, right eye, initial encounter: Secondary | ICD-10-CM | POA: Diagnosis not present

## 2019-02-11 ENCOUNTER — Encounter: Payer: Self-pay | Admitting: Family Medicine

## 2019-02-12 ENCOUNTER — Other Ambulatory Visit: Payer: Self-pay | Admitting: Orthopedic Surgery

## 2019-02-12 DIAGNOSIS — S6991XA Unspecified injury of right wrist, hand and finger(s), initial encounter: Secondary | ICD-10-CM

## 2019-02-20 ENCOUNTER — Ambulatory Visit: Payer: Self-pay | Admitting: Family Medicine

## 2019-02-27 ENCOUNTER — Encounter: Payer: Self-pay | Admitting: Family Medicine

## 2019-03-02 ENCOUNTER — Encounter: Payer: Self-pay | Admitting: Family Medicine

## 2019-03-04 IMAGING — RF DG UGI W/ KUB
11 of 18 series · 13 of 24 positions shown · non-contrast
Comparison: None.

CLINICAL DATA: 51-year-old male with morbid obesity. Preoperative
study prior to sleeve gastrectomy.

EXAM:
UPPER GI SERIES WITH KUB
TECHNIQUE: After obtaining a scout radiograph a routine upper GI series was
performed using thin barium.
FLUOROSCOPY TIME:  Fluoroscopy Time:   2 minutes and 36 seconds
Radiation Exposure Index (if provided by the fluoroscopic device):
63.3 mGy

[Series 1: t abdomen supine · 0.15mm/px · 1 of 1 slices shown]
[im 1/1]
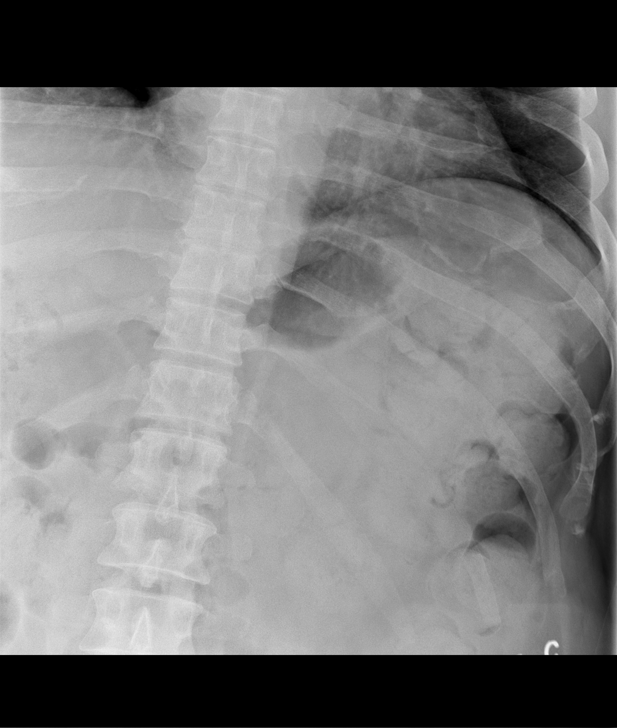

[Series 4: fluoro_barium 2fps_bw · 0.18mm/px · 1 of 1 slices shown (1 of 6)]
[im 1/1]
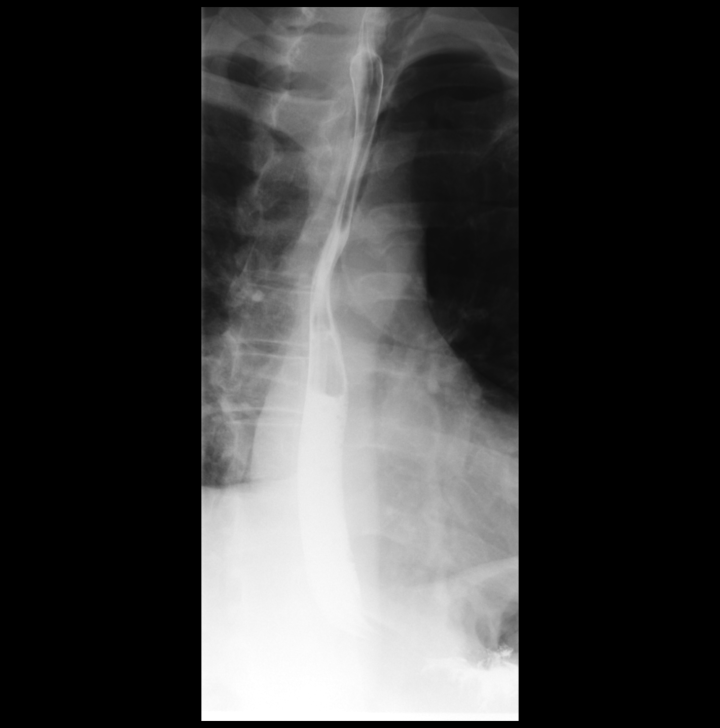

[Series 6: fluoro_barium 2fps_bw · 0.18mm/px · 1 of 1 slices shown (2 of 6)]
[im 1/1]
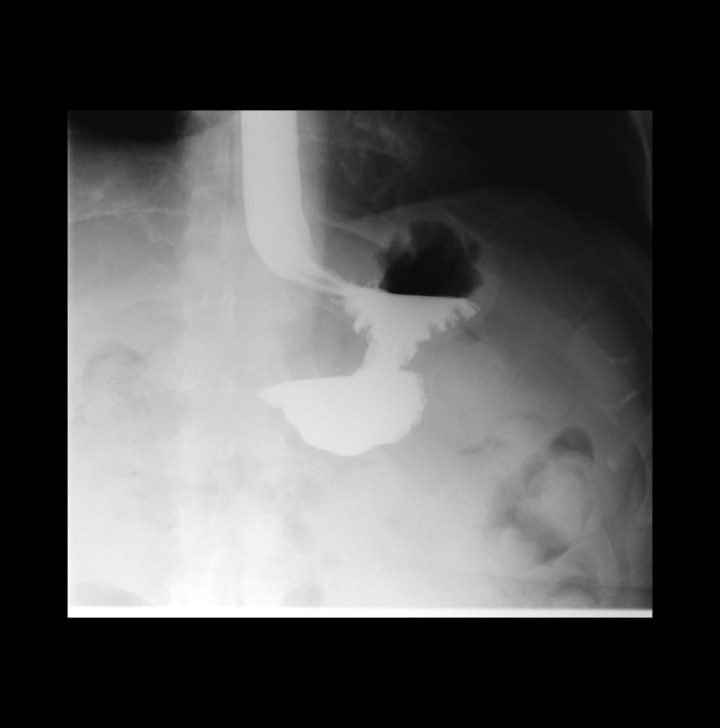

[Series 9: fluoro_barium 2fps_bw · 0.18mm/px · 1 of 1 slices shown (3 of 6)]
[im 1/1]
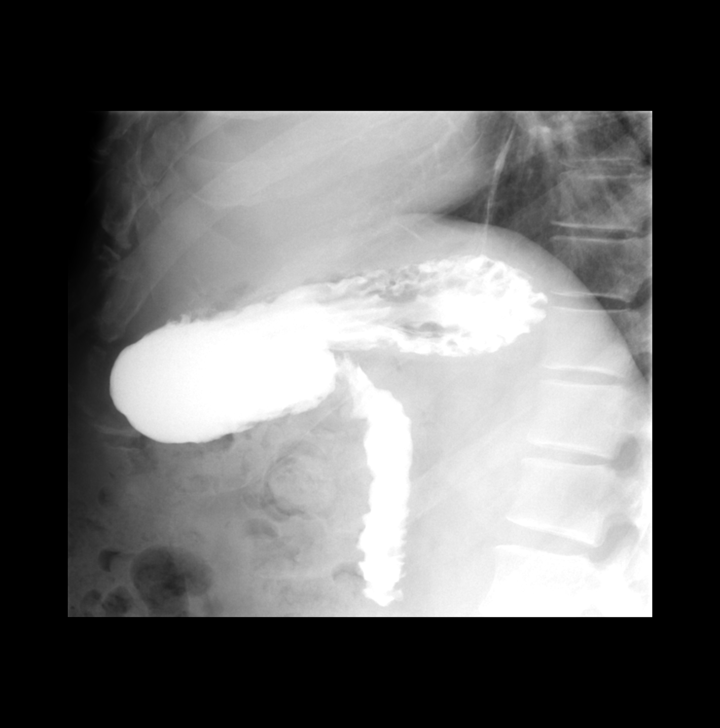

[Series 11: fluoro_barium 2fps_bw · 0.18mm/px · 1 of 1 slices shown (4 of 6)]
[im 1/1]
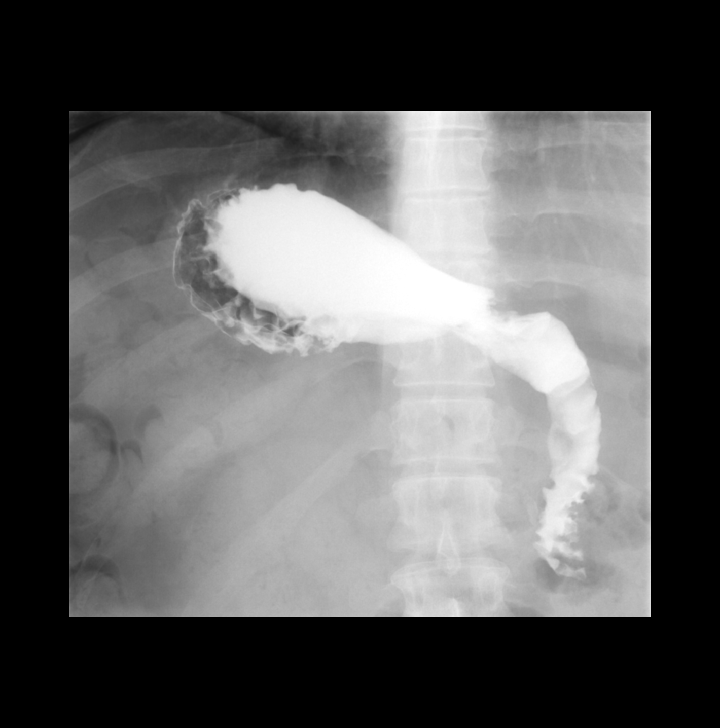

[Series 14: fluoro_barium 2fps_bw · 0.18mm/px · 1 of 1 slices shown (5 of 6)]
[im 1/1]
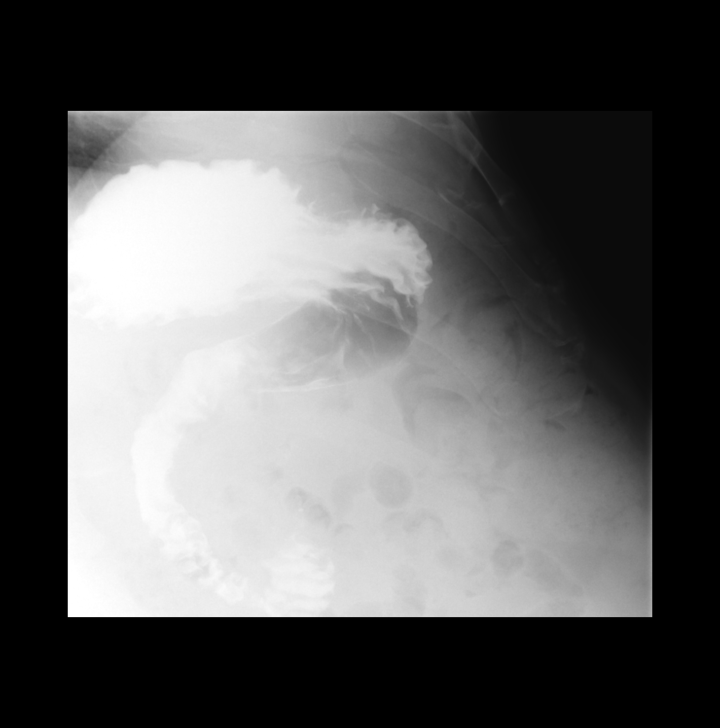

[Series 15: cp_standard · 0.53mm/px · 2 of 37 frames shown (1 of 4)]
[frame 32/37]
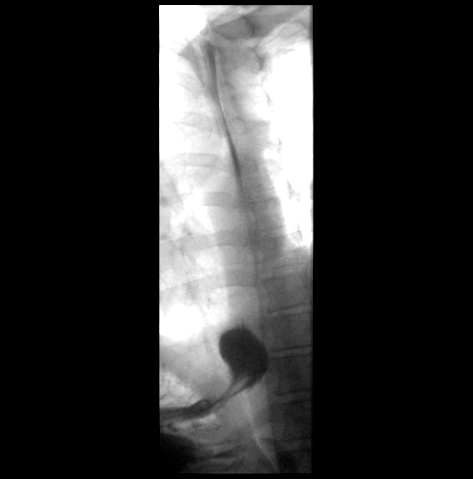
[frame 33/37]
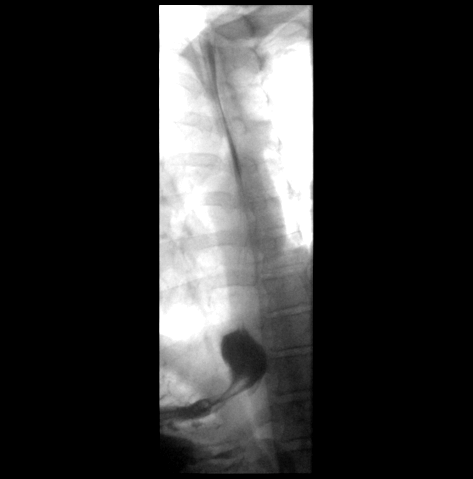

[Series 16: cp_standard · 0.54mm/px · 1 of 41 frames shown (2 of 4)]
[frame 21/41]
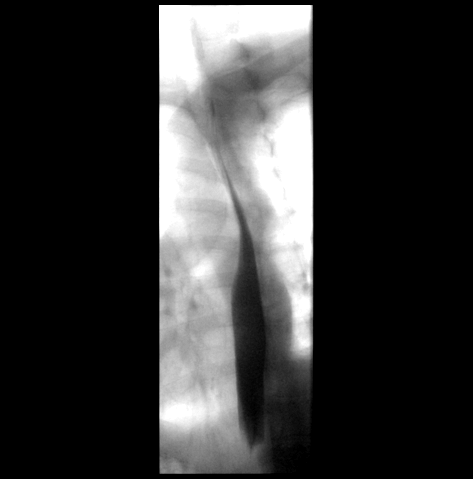

[Series 17: cp_standard · 0.54mm/px · 2 of 43 frames shown (3 of 4)]
[frame 7/43]
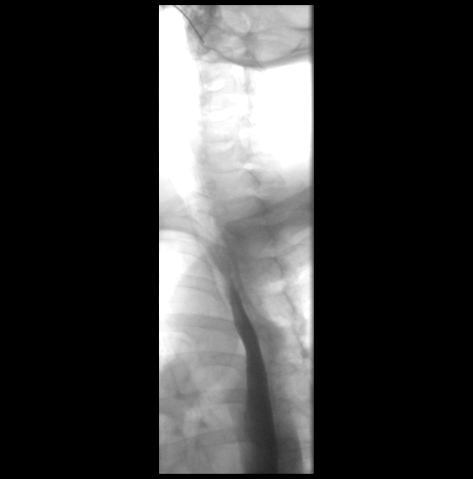
[frame 38/43]
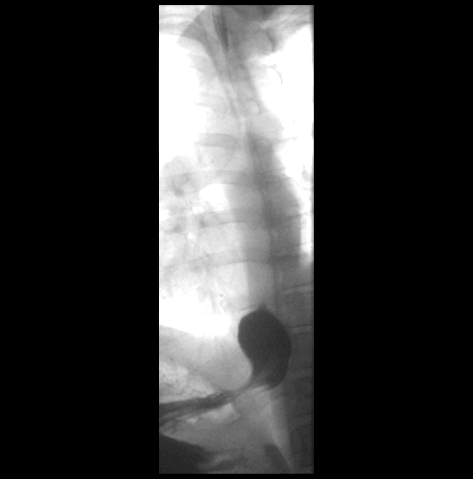

[Series 19: fluoro_barium 2fps_bw · 0.18mm/px · 1 of 1 slices shown (6 of 6)]
[im 1/1]
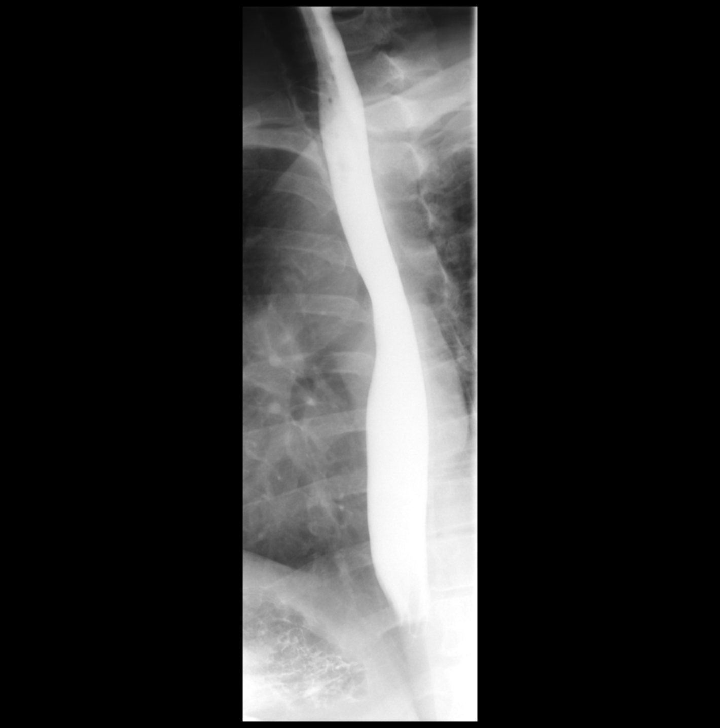

[Series 22: cp_standard · 0.26mm/px · 1 of 1 slices shown (4 of 4)]
[im 1/1]
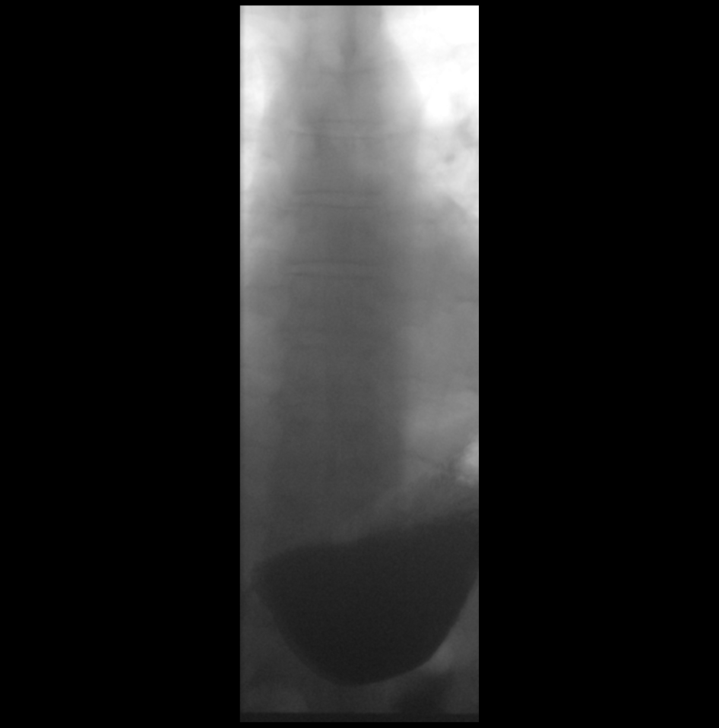

[13 of 24 positions shown; findings below may reference images not displayed]

FINDINGS: Preprocedural KUB demonstrates a nonobstructive bowel gas pattern.

Single contrast imaging of the esophagus demonstrates normal
anatomy. Single swallow attempts were observed which demonstrated
generally normal esophageal motility, although there was 1 swallow
attempt with failure to fully propagate the primary peristaltic
wave. No tertiary contractions were noted. Full column esophagram
demonstrated no esophageal mass, stricture, ring or hiatal hernia.
Water siphon test was negative for gastroesophageal reflux. A barium
tablet was administered, which passed readily into the stomach.

Single contrast imaging of the stomach demonstrated node definite
gastric mass or ulcer. Normal gastric anatomy. Duodenal bulb and
proximal duodenum were grossly normal in appearance.
IMPRESSION: 1. Mild nonspecific esophageal motility disorder.
2. Otherwise, normal single contrast upper GI.

## 2019-03-05 ENCOUNTER — Encounter: Payer: Self-pay | Admitting: Family Medicine
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
## 2019-03-06 ENCOUNTER — Ambulatory Visit (INDEPENDENT_AMBULATORY_CARE_PROVIDER_SITE_OTHER): Payer: PPO | Admitting: Family Medicine

## 2019-03-06 ENCOUNTER — Ambulatory Visit: Payer: PPO | Admitting: Family Medicine

## 2019-03-06 VITALS — BP 114/55 | HR 56 | Wt 250.0 lb

## 2019-03-06 DIAGNOSIS — F329 Major depressive disorder, single episode, unspecified: Secondary | ICD-10-CM | POA: Diagnosis not present

## 2019-03-06 DIAGNOSIS — F4323 Adjustment disorder with mixed anxiety and depressed mood: Secondary | ICD-10-CM

## 2019-03-06 DIAGNOSIS — F32A Depression, unspecified: Secondary | ICD-10-CM
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
## 2019-03-24 ENCOUNTER — Ambulatory Visit
Admission: RE | Admit: 2019-03-24 | Discharge: 2019-03-24 | Disposition: A | Discharge status: Home / Self Care | Payer: PPO | Source: Ambulatory Visit | Attending: Orthopedic Surgery | Admitting: Orthopedic Surgery

## 2019-03-24 ENCOUNTER — Other Ambulatory Visit: Payer: Self-pay

## 2019-03-24 DIAGNOSIS — S6991XA Unspecified injury of right wrist, hand and finger(s), initial encounter: Secondary | ICD-10-CM

## 2019-03-24 MED ORDER — IOPAMIDOL (ISOVUE-M 200) INJECTION 41%
1.0000 mL | Freq: Once | INTRAMUSCULAR | Status: AC
  Administered 2019-03-24: 1 mL via INTRA_ARTICULAR

## 2019-03-28 ENCOUNTER — Other Ambulatory Visit: Payer: Self-pay | Admitting: Orthopedic Surgery

## 2019-03-28 DIAGNOSIS — S6991XD Unspecified injury of right wrist, hand and finger(s), subsequent encounter: Secondary | ICD-10-CM | POA: Diagnosis not present

## 2019-03-28 DIAGNOSIS — F4322 Adjustment disorder with anxiety: Secondary | ICD-10-CM | POA: Diagnosis not present

## 2019-03-31 ENCOUNTER — Encounter (HOSPITAL_COMMUNITY): Payer: Self-pay

## 2019-04-08 DIAGNOSIS — G4733 Obstructive sleep apnea (adult) (pediatric): Secondary | ICD-10-CM | POA: Diagnosis not present

## 2019-04-10 ENCOUNTER — Other Ambulatory Visit: Payer: Self-pay

## 2019-04-10 ENCOUNTER — Encounter (HOSPITAL_BASED_OUTPATIENT_CLINIC_OR_DEPARTMENT_OTHER): Payer: Self-pay | Admitting: *Deleted

## 2019-04-10 NOTE — Progress Notes (Signed)
Pt to come Monday to pick up Ensure. Bring all meds day of surgery and CPAP machine.

## 2019-04-14 ENCOUNTER — Other Ambulatory Visit (HOSPITAL_COMMUNITY)
Admission: RE | Admit: 2019-04-14 | Discharge: 2019-04-14 | Disposition: A | Discharge status: Home / Self Care | Payer: PPO | Source: Ambulatory Visit | Attending: Orthopedic Surgery | Admitting: Orthopedic Surgery

## 2019-04-14 DIAGNOSIS — Z01812 Encounter for preprocedural laboratory examination: Secondary | ICD-10-CM | POA: Insufficient documentation

## 2019-04-14 DIAGNOSIS — Z1159 Encounter for screening for other viral diseases: Secondary | ICD-10-CM | POA: Insufficient documentation

## 2019-04-14 LAB — SARS CORONAVIRUS 2 (TAT 6-24 HRS): SARS Coronavirus 2: NEGATIVE

## 2019-04-14 NOTE — Progress Notes (Signed)
Ensure pre surgery drink given with instructions to complete by 0900 dos, pt verbalized understanding.

## 2019-04-17 ENCOUNTER — Other Ambulatory Visit: Payer: Self-pay

## 2019-04-17 ENCOUNTER — Ambulatory Visit (HOSPITAL_BASED_OUTPATIENT_CLINIC_OR_DEPARTMENT_OTHER)
Admission: RE | Admit: 2019-04-17 | Discharge: 2019-04-17 | Disposition: A | Discharge status: Home / Self Care | Payer: PPO | Attending: Orthopedic Surgery | Admitting: Orthopedic Surgery

## 2019-04-17 ENCOUNTER — Encounter (HOSPITAL_BASED_OUTPATIENT_CLINIC_OR_DEPARTMENT_OTHER)
Admission: RE | Disposition: A | Discharge status: Home / Self Care | Payer: Self-pay | Source: Home / Self Care | Attending: Orthopedic Surgery

## 2019-04-17 ENCOUNTER — Ambulatory Visit (HOSPITAL_BASED_OUTPATIENT_CLINIC_OR_DEPARTMENT_OTHER): Payer: PPO | Admitting: Anesthesiology

## 2019-04-17 ENCOUNTER — Encounter (HOSPITAL_BASED_OUTPATIENT_CLINIC_OR_DEPARTMENT_OTHER): Payer: Self-pay

## 2019-04-17 DIAGNOSIS — Z885 Allergy status to narcotic agent status: Secondary | ICD-10-CM | POA: Diagnosis not present

## 2019-04-17 DIAGNOSIS — E039 Hypothyroidism, unspecified: Secondary | ICD-10-CM | POA: Insufficient documentation

## 2019-04-17 DIAGNOSIS — Z791 Long term (current) use of non-steroidal anti-inflammatories (NSAID): Secondary | ICD-10-CM | POA: Insufficient documentation

## 2019-04-17 DIAGNOSIS — Z79899 Other long term (current) drug therapy: Secondary | ICD-10-CM | POA: Insufficient documentation

## 2019-04-17 DIAGNOSIS — Z882 Allergy status to sulfonamides status: Secondary | ICD-10-CM | POA: Insufficient documentation

## 2019-04-17 DIAGNOSIS — K219 Gastro-esophageal reflux disease without esophagitis: Secondary | ICD-10-CM | POA: Insufficient documentation

## 2019-04-17 DIAGNOSIS — Z881 Allergy status to other antibiotic agents status: Secondary | ICD-10-CM | POA: Diagnosis not present

## 2019-04-17 DIAGNOSIS — F329 Major depressive disorder, single episode, unspecified: Secondary | ICD-10-CM | POA: Diagnosis not present

## 2019-04-17 DIAGNOSIS — G894 Chronic pain syndrome: Secondary | ICD-10-CM | POA: Insufficient documentation

## 2019-04-17 DIAGNOSIS — Z79890 Hormone replacement therapy (postmenopausal): Secondary | ICD-10-CM | POA: Insufficient documentation

## 2019-04-17 DIAGNOSIS — X58XXXA Exposure to other specified factors, initial encounter: Secondary | ICD-10-CM | POA: Diagnosis not present

## 2019-04-17 DIAGNOSIS — I10 Essential (primary) hypertension: Secondary | ICD-10-CM | POA: Insufficient documentation

## 2019-04-17 DIAGNOSIS — Z87891 Personal history of nicotine dependence: Secondary | ICD-10-CM | POA: Diagnosis not present

## 2019-04-17 DIAGNOSIS — E119 Type 2 diabetes mellitus without complications: Secondary | ICD-10-CM | POA: Diagnosis not present

## 2019-04-17 DIAGNOSIS — S63681A Other sprain of right thumb, initial encounter: Secondary | ICD-10-CM | POA: Diagnosis present

## 2019-04-17 DIAGNOSIS — J449 Chronic obstructive pulmonary disease, unspecified: Secondary | ICD-10-CM | POA: Diagnosis not present

## 2019-04-17 DIAGNOSIS — S63418A Traumatic rupture of collateral ligament of other finger at metacarpophalangeal and interphalangeal joint, initial encounter: Secondary | ICD-10-CM | POA: Diagnosis not present

## 2019-04-17 DIAGNOSIS — F419 Anxiety disorder, unspecified: Secondary | ICD-10-CM | POA: Insufficient documentation

## 2019-04-17 DIAGNOSIS — G4730 Sleep apnea, unspecified: Secondary | ICD-10-CM | POA: Diagnosis not present

## 2019-04-17 DIAGNOSIS — M19042 Primary osteoarthritis, left hand: Secondary | ICD-10-CM | POA: Insufficient documentation

## 2019-04-17 DIAGNOSIS — F418 Other specified anxiety disorders: Secondary | ICD-10-CM | POA: Diagnosis not present

## 2019-04-17 DIAGNOSIS — S63641A Sprain of metacarpophalangeal joint of right thumb, initial encounter: Secondary | ICD-10-CM | POA: Diagnosis not present

## 2019-04-17 DIAGNOSIS — Z880 Allergy status to penicillin: Secondary | ICD-10-CM | POA: Insufficient documentation

## 2019-04-17 HISTORY — DX: Unspecified osteoarthritis, unspecified site: M19.90

## 2019-04-17 HISTORY — PX: ULNAR COLLATERAL LIGAMENT REPAIR: SHX6159

## 2019-04-17 SURGERY — REPAIR, LIGAMENT, ULNAR COLLATERAL
Anesthesia: Monitor Anesthesia Care | Site: Hand | Laterality: Right

## 2019-04-17 MED ORDER — BUPIVACAINE HCL (PF) 0.25 % IJ SOLN
INTRAMUSCULAR | Status: AC
  Filled 2019-04-17: qty 30

## 2019-04-17 MED ORDER — MIDAZOLAM HCL 2 MG/2ML IJ SOLN
INTRAMUSCULAR | Status: AC
  Filled 2019-04-17: qty 2

## 2019-04-17 MED ORDER — VANCOMYCIN HCL IN DEXTROSE 1-5 GM/200ML-% IV SOLN
1000.0000 mg | INTRAVENOUS | Status: AC
  Administered 2019-04-17: 1000 mg via INTRAVENOUS

## 2019-04-17 MED ORDER — PROPOFOL 10 MG/ML IV BOLUS
INTRAVENOUS | Status: DC | PRN
  Administered 2019-04-17: 200 mg via INTRAVENOUS

## 2019-04-17 MED ORDER — METOCLOPRAMIDE HCL 5 MG/ML IJ SOLN: 10.0000 mg | Freq: Once | INTRAMUSCULAR | Status: DC | PRN

## 2019-04-17 MED ORDER — DEXAMETHASONE SODIUM PHOSPHATE 10 MG/ML IJ SOLN
INTRAMUSCULAR | Status: DC | PRN
  Administered 2019-04-17: 4 mg via INTRAVENOUS

## 2019-04-17 MED ORDER — EPHEDRINE SULFATE 50 MG/ML IJ SOLN
INTRAMUSCULAR | Status: DC | PRN
  Administered 2019-04-17: 10 mg via INTRAVENOUS

## 2019-04-17 MED ORDER — MIDAZOLAM HCL 5 MG/5ML IJ SOLN
INTRAMUSCULAR | Status: DC | PRN
  Administered 2019-04-17: 2 mg via INTRAVENOUS

## 2019-04-17 MED ORDER — BUPIVACAINE HCL (PF) 0.25 % IJ SOLN
INTRAMUSCULAR | Status: DC | PRN
  Administered 2019-04-17: 10 mL

## 2019-04-17 MED ORDER — FENTANYL CITRATE (PF) 100 MCG/2ML IJ SOLN: 25.0000 ug | INTRAMUSCULAR | Status: DC | PRN

## 2019-04-17 MED ORDER — CHLORHEXIDINE GLUCONATE 4 % EX LIQD: 60.0000 mL | Freq: Once | CUTANEOUS | Status: DC

## 2019-04-17 MED ORDER — FENTANYL CITRATE (PF) 100 MCG/2ML IJ SOLN
INTRAMUSCULAR | Status: AC
  Filled 2019-04-17: qty 2

## 2019-04-17 MED ORDER — VANCOMYCIN HCL IN DEXTROSE 1-5 GM/200ML-% IV SOLN
INTRAVENOUS | Status: AC
  Filled 2019-04-17: qty 200

## 2019-04-17 MED ORDER — NEOSTIGMINE METHYLSULFATE 3 MG/3ML IV SOSY
PREFILLED_SYRINGE | INTRAVENOUS | Status: AC
  Filled 2019-04-17: qty 3

## 2019-04-17 MED ORDER — LIDOCAINE HCL (CARDIAC) PF 100 MG/5ML IV SOSY
PREFILLED_SYRINGE | INTRAVENOUS | Status: DC | PRN
  Administered 2019-04-17: 60 mg via INTRAVENOUS

## 2019-04-17 MED ORDER — FENTANYL CITRATE (PF) 100 MCG/2ML IJ SOLN
INTRAMUSCULAR | Status: DC | PRN
  Administered 2019-04-17 (×2): 50 ug via INTRAVENOUS

## 2019-04-17 MED ORDER — LACTATED RINGERS IV SOLN: INTRAVENOUS | Status: DC

## 2019-04-17 MED ORDER — LACTATED RINGERS IV SOLN
INTRAVENOUS | Status: DC
  Administered 2019-04-17: 13:00:00 via INTRAVENOUS

## 2019-04-17 MED ORDER — PROPOFOL 500 MG/50ML IV EMUL
INTRAVENOUS | Status: DC | PRN
  Administered 2019-04-17: 25 ug/kg/min via INTRAVENOUS

## 2019-04-17 MED ORDER — OXYCODONE-ACETAMINOPHEN 5-325 MG PO TABS: ORAL_TABLET | ORAL | 0 refills | Status: DC

## 2019-04-17 MED ORDER — ONDANSETRON HCL 4 MG/2ML IJ SOLN
INTRAMUSCULAR | Status: DC | PRN
  Administered 2019-04-17: 4 mg via INTRAVENOUS

## 2019-04-17 SURGICAL SUPPLY — 74 items
ANCHOR JUGGERKNOT 1.0 1DR 2-0 (Anchor) ×2 IMPLANT
ANCHOR SUT 1.45 SZ 1 SHORT (Anchor) ×2 IMPLANT
BANDAGE ACE 3X5.8 VEL STRL LF (GAUZE/BANDAGES/DRESSINGS) ×2 IMPLANT
BLADE MINI RND TIP GREEN BEAV (BLADE) ×3 IMPLANT
BLADE SURG 15 STRL LF DISP TIS (BLADE) ×2 IMPLANT
BLADE SURG 15 STRL SS (BLADE) ×4
BNDG ELASTIC 2X5.8 VLCR STR LF (GAUZE/BANDAGES/DRESSINGS) IMPLANT
BNDG ESMARK 4X9 LF (GAUZE/BANDAGES/DRESSINGS) ×3 IMPLANT
BNDG GAUZE ELAST 4 BULKY (GAUZE/BANDAGES/DRESSINGS) ×3 IMPLANT
CATH ROBINSON RED A/P 8FR (CATHETERS) IMPLANT
CHLORAPREP W/TINT 26 (MISCELLANEOUS) ×3 IMPLANT
CORD BIPOLAR FORCEPS 12FT (ELECTRODE) ×3 IMPLANT
COVER BACK TABLE REUSABLE LG (DRAPES) ×3 IMPLANT
COVER MAYO STAND REUSABLE (DRAPES) ×3 IMPLANT
COVER WAND RF STERILE (DRAPES) IMPLANT
CUFF TOURN SGL QUICK 18X4 (TOURNIQUET CUFF) ×3 IMPLANT
DECANTER SPIKE VIAL GLASS SM (MISCELLANEOUS) IMPLANT
DRAPE EXTREMITY T 121X128X90 (DISPOSABLE) ×3 IMPLANT
DRAPE OEC MINIVIEW 54X84 (DRAPES) IMPLANT
DRAPE SURG 17X23 STRL (DRAPES) ×3 IMPLANT
DRSG PAD ABDOMINAL 8X10 ST (GAUZE/BANDAGES/DRESSINGS) IMPLANT
GAUZE SPONGE 4X4 12PLY STRL (GAUZE/BANDAGES/DRESSINGS) ×3 IMPLANT
GAUZE XEROFORM 1X8 LF (GAUZE/BANDAGES/DRESSINGS) ×3 IMPLANT
GLOVE BIO SURGEON STRL SZ7.5 (GLOVE) ×3 IMPLANT
GLOVE BIOGEL PI IND STRL 8 (GLOVE) ×1 IMPLANT
GLOVE BIOGEL PI IND STRL 8.5 (GLOVE) ×1 IMPLANT
GLOVE BIOGEL PI INDICATOR 8 (GLOVE) ×2
GLOVE BIOGEL PI INDICATOR 8.5 (GLOVE) ×2
GLOVE SURG ORTHO 8.0 STRL STRW (GLOVE) ×2 IMPLANT
GOWN STRL REUS W/ TWL LRG LVL3 (GOWN DISPOSABLE) ×1 IMPLANT
GOWN STRL REUS W/TWL LRG LVL3 (GOWN DISPOSABLE) ×2
GOWN STRL REUS W/TWL XL LVL3 (GOWN DISPOSABLE) ×5 IMPLANT
K-WIRE .035X4 (WIRE) IMPLANT
NDL HYPO 25X1 1.5 SAFETY (NEEDLE) IMPLANT
NDL KEITH (NEEDLE) IMPLANT
NDL SAFETY ECLIPSE 18X1.5 (NEEDLE) IMPLANT
NEEDLE HYPO 18GX1.5 SHARP (NEEDLE)
NEEDLE HYPO 22GX1.5 SAFETY (NEEDLE) ×2 IMPLANT
NEEDLE HYPO 25X1 1.5 SAFETY (NEEDLE) IMPLANT
NEEDLE KEITH (NEEDLE) IMPLANT
NS IRRIG 1000ML POUR BTL (IV SOLUTION) ×3 IMPLANT
PACK BASIN DAY SURGERY FS (CUSTOM PROCEDURE TRAY) ×3 IMPLANT
PAD CAST 3X4 CTTN HI CHSV (CAST SUPPLIES) ×1 IMPLANT
PAD CAST 4YDX4 CTTN HI CHSV (CAST SUPPLIES) IMPLANT
PADDING CAST ABS 4INX4YD NS (CAST SUPPLIES) ×2
PADDING CAST ABS COTTON 4X4 ST (CAST SUPPLIES) ×1 IMPLANT
PADDING CAST COTTON 3X4 STRL (CAST SUPPLIES) ×2
PADDING CAST COTTON 4X4 STRL (CAST SUPPLIES)
PASSER SUT SWANSON 36MM LOOP (INSTRUMENTS) IMPLANT
SLEEVE SCD COMPRESS KNEE MED (MISCELLANEOUS) ×3 IMPLANT
SLING ARM FOAM STRAP LRG (SOFTGOODS) IMPLANT
SPLINT FAST PLASTER 5X30 (CAST SUPPLIES)
SPLINT PLASTER CAST FAST 5X30 (CAST SUPPLIES) IMPLANT
SPLINT PLASTER CAST XFAST 3X15 (CAST SUPPLIES) IMPLANT
SPLINT PLASTER XTRA FASTSET 3X (CAST SUPPLIES)
STOCKINETTE 4X48 STRL (DRAPES) ×3 IMPLANT
SUT ETHIBOND 3-0 V-5 (SUTURE) IMPLANT
SUT ETHILON 3 0 PS 1 (SUTURE) IMPLANT
SUT ETHILON 4 0 PS 2 18 (SUTURE) IMPLANT
SUT FIBERWIRE 2-0 18 17.9 3/8 (SUTURE)
SUT FIBERWIRE 3-0 18 TAPR NDL (SUTURE)
SUT MERSILENE 2.0 SH NDLE (SUTURE) IMPLANT
SUT MERSILENE 4 0 P 3 (SUTURE) IMPLANT
SUT SILK 4 0 PS 2 (SUTURE) IMPLANT
SUT VIC AB 0 SH 27 (SUTURE) IMPLANT
SUT VIC AB 2-0 SH 27 (SUTURE)
SUT VIC AB 2-0 SH 27XBRD (SUTURE) IMPLANT
SUT VICRYL 4-0 PS2 18IN ABS (SUTURE) IMPLANT
SUTURE FIBERWR 2-0 18 17.9 3/8 (SUTURE) IMPLANT
SUTURE FIBERWR 3-0 18 TAPR NDL (SUTURE) IMPLANT
SYR BULB 3OZ (MISCELLANEOUS) ×3 IMPLANT
SYR CONTROL 10ML LL (SYRINGE) IMPLANT
TOWEL GREEN STERILE FF (TOWEL DISPOSABLE) ×6 IMPLANT
UNDERPAD 30X30 (UNDERPADS AND DIAPERS) ×3 IMPLANT

## 2019-04-17 NOTE — Anesthesia Procedure Notes (Signed)
Procedure Name: LMA Insertion Date/Time: 04/17/2019 1:42 PM Performed by: Signe Colt, CRNA Pre-anesthesia Checklist: Patient identified, Emergency Drugs available, Suction available and Patient being monitored Patient Re-evaluated:Patient Re-evaluated prior to induction Oxygen Delivery Method: Circle system utilized Preoxygenation: Pre-oxygenation with 100% oxygen Induction Type: IV induction Ventilation: Mask ventilation without difficulty LMA: LMA inserted LMA Size: 4.0 Number of attempts: 1 Airway Equipment and Method: Bite block Placement Confirmation: positive ETCO2 Tube secured with: Tape Dental Injury: Teeth and Oropharynx as per pre-operative assessment

## 2019-04-17 NOTE — Op Note (Signed)
I assisted Surgeon(s) and Role:    * Leanora Cover, MD - Primary    * Daryll Brod, MD - Assisting on the Procedure(s): RIGHT THUMB [place] on 04/17/2019.  I provided assistance on this case as follows:sdetup, approach, debridement, repair, closure of the incisions, and application of the dressings and splints. Electronically signed by: Daryll Brod, MD Date: 04/17/2019 Time: 2:34 PM

## 2019-04-17 NOTE — Discharge Instructions (Addendum)

## 2019-04-17 NOTE — Transfer of Care (Signed)
Immediate Anesthesia Transfer of Care Note  Patient: Corey Bell  Procedure(s) Performed: RIGHT THUMB ULNAR COLLATERAL LIGAMENT REPAIR (Right Hand)  Patient Location: PACU  Anesthesia Type:General  Level of Consciousness: drowsy and patient cooperative  Airway & Oxygen Therapy: Patient Spontanous Breathing and Patient connected to nasal cannula oxygen  Post-op Assessment: Report given to RN and Post -op Vital signs reviewed and stable  Post vital signs: Reviewed and stable  Last Vitals:  Vitals Value Taken Time  BP    Temp    Pulse 54 04/17/19 1439  Resp 11 04/17/19 1439  SpO2 98 % 04/17/19 1439  Vitals shown include unvalidated device data.  Last Pain:  Vitals:   04/17/19 1039  TempSrc: Oral         Complications: No apparent anesthesia complications

## 2019-04-17 NOTE — Anesthesia Preprocedure Evaluation (Addendum)
Anesthesia Evaluation  Patient identified by MRN, date of birth, ID band Patient awake    Reviewed: Allergy & Precautions, NPO status , Patient's Chart, lab work & pertinent test results  Airway Mallampati: II  TM Distance: >3 FB Neck ROM: Full    Dental no notable dental hx.    Pulmonary asthma , COPD, former smoker,    Pulmonary exam normal breath sounds clear to auscultation       Cardiovascular hypertension, Normal cardiovascular exam Rhythm:Regular Rate:Normal     Neuro/Psych Anxiety Depression negative neurological ROS     GI/Hepatic Neg liver ROS, GERD  ,  Endo/Other  diabetes, Type 2  Renal/GU negative Renal ROS  negative genitourinary   Musculoskeletal negative musculoskeletal ROS (+)   Abdominal   Peds negative pediatric ROS (+)  Hematology negative hematology ROS (+)   Anesthesia Other Findings   Reproductive/Obstetrics negative OB ROS                             Anesthesia Physical Anesthesia Plan  ASA: II  Anesthesia Plan: General   Post-op Pain Management:    Induction: Intravenous  PONV Risk Score and Plan: 1 and Treatment may vary due to age or medical condition and Ondansetron  Airway Management Planned: LMA  Additional Equipment:   Intra-op Plan:   Post-operative Plan:   Informed Consent: I have reviewed the patients History and Physical, chart, labs and discussed the procedure including the risks, benefits and alternatives for the proposed anesthesia with the patient or authorized representative who has indicated his/her understanding and acceptance.     Dental advisory given  Plan Discussed with: CRNA  Anesthesia Plan Comments:       Anesthesia Quick Evaluation

## 2019-04-17 NOTE — H&P (Signed)
Corey Bell is an 53 y.o. male.   Chief Complaint: right thumb collateral ligament injury HPI: 53 yo male injured right thumb.  Continued pain in mp joint.  MRI confirms ulnar collateral ligament injury.  He wishes to have surgical repair.  Allergies:  Allergies  Allergen Reactions  . Augmentin [Amoxicillin-Pot Clavulanate] Diarrhea  . Demerol [Meperidine] Other (See Comments)    "goes crazy and has the strength of 10 men"  . Eggs Or Egg-Derived Products Diarrhea and Nausea And Vomiting  . Sulfamethoxazole Other (See Comments)    Blisters form in mouth  . Statins Rash  . Sulfa Antibiotics Rash  . Sulfacetamide Sodium Rash    Past Medical History:  Diagnosis Date  . Anxiety   . Aphthous ulcer 10-04-12   improving  . Arthritis    left index fnger  . Carpal tunnel syndrome, bilateral    damaged nerve in neck  . Chronic pain syndrome   . COPD (chronic obstructive pulmonary disease) (H[facility])    mild  . Depression   . Diabetes mellitus without complication ([place])    type 2 , has not been medicated in over 1 year, no meds since Gastric sleeve  . Diverticulitis of colon   . Esophageal reflux   . H/O acute prostatitis   . Hypothyroidism   . Insomnia, unspecified   . Mixed hyperlipidemia   . Peyronie disease 12-20-12   PER uROLOGY  . Seasonal allergies   . Sleep apnea    uses cpap  . Tympanic membrane perforation    history of    Past Surgical History:  Procedure Laterality Date  . LAPAROSCOPIC GASTRIC SLEEVE RESECTION N/A 07/31/2017   Procedure: LAPAROSCOPIC GASTRIC SLEEVE RESECTION WITH UPPER ENDOSCOPY;  Surgeon: Alphonsa Overall, MD;  Location: [facility];  Service: General;  Laterality: N/A;  . LumbarSacral Disc Surgery     L5-S1  . MENISCUS REPAIR Left    torn meniscus  . NESBIT PROCEDURE N/A 08/22/2013   Procedure: 16 DOT PLACTATION;  Surgeon: Claybon Jabs, MD;  Location: [facility];  Service: Urology;  Laterality: N/A;  . TYMPANOSTOMY TUBE PLACEMENT   1975  . VASECTOMY Bilateral 08/22/2013   Procedure: VASECTOMY;  Surgeon: Claybon Jabs, MD;  Location[facility];  Service: Urology;  Laterality: Bilateral;    Family History: Family History  Problem Relation Age of Onset  . Congestive Heart Failure Father   . Heart disease Father   . COPD Father   . Renal cancer Paternal Grandfather        renal call carcinoma  . Heart disease Brother        stents at 83 yo  . Bladder Cancer Paternal Uncle   . Renal cancer Paternal Uncle        renal cell carcinoma  . Heart disease Other   . Parkinsonism Neg Hx     Social History:   reports that he quit smoking about 11 years ago. His smoking use included cigarettes. He has a 45.00 pack-year smoking history. He has never used smokeless tobacco. He reports current alcohol use of about 1.0 standard drinks of alcohol per week. He reports that he does not use drugs.  Medications: Medications Prior to Admission  Medication Sig Dispense Refill  . buPROPion (WELLBUTRIN XL) 300 MG 24 hr tablet Take 1 tablet (300 mg total) by mouth daily. 30 tablet 5  . cyclobenzaprine (FLEXERIL) 10 MG tablet Take 1 tablet (10 mg total) by mouth at  bedtime. 30 tablet 5  . diphenhydrAMINE (BENADRYL) 25 mg capsule Take 50 mg by mouth at bedtime.    Marland Kitchen erythromycin with ethanol (THERAMYCIN) 2 % external solution APPLY TOPICALLY TWICE A DAY AS NEEDED 60 mL 2  . Ferrous Sulfate (IRON) 325 (65 Fe) MG TABS Take by mouth.    . fexofenadine (ALLEGRA) 180 MG tablet Take 180 mg by mouth daily.    Marland Kitchen gabapentin (NEURONTIN) 800 MG tablet Take 1 tablet (800 mg total) by mouth 3 (three) times daily. (Patient taking differently: Take 800 mg by mouth 4 (four) times daily. ) 360 tablet 11  . ibuprofen (ADVIL,MOTRIN) 600 MG tablet Take 600 mg by mouth daily.     Marland Kitchen levothyroxine (SYNTHROID, LEVOTHROID) 75 MCG tablet Take 1 tablet (75 mcg total) by mouth daily. 30 tablet 11  . methocarbamol (ROBAXIN) 500 MG tablet TAKE 2  TABLETS BY MOUTH EVERY 6 HOURS ASNEEDED FOR MUSCLE SPASMS 120 tablet 5  . montelukast (SINGULAIR) 10 MG tablet Take 1 tablet (10 mg total) by mouth at bedtime. 90 tablet 3  . oxyCODONE-acetaminophen (PERCOCET/ROXICET) 5-325 MG tablet Take 1 tablet by mouth every 6 (six) hours as needed for severe pain. 90 tablet 0  . pantoprazole (PROTONIX) 40 MG tablet Take 1 tablet (40 mg total) by mouth daily. 90 tablet 3  . phenylephrine (EQ SUPHEDRINE PE) 10 MG TABS tablet 1q4-6 h prn    . Polyvinyl Alcohol (LIQUID TEARS OP) Place 1 drop into both eyes as needed (for dry eyes).    . Saw Palmetto 450 MG CAPS Take 2 capsules by mouth daily.    . tamsulosin (FLOMAX) 0.4 MG CAPS capsule Take 0.8 mg by mouth daily after supper.    . Thiamine HCl (VITAMIN B-1) 250 MG tablet Take 1 tablet by mouth daily.    . traMADol (ULTRAM) 50 MG tablet TAKE 1 TABLET BY MOUTH EVERY 8 HOURS AS NEEDED FOR MODERATE TO SEVEREPAIN 30 tablet 5  . Trolamine Salicylate (ASPERCREME EX) Apply 1 application topically as needed (for pain).    . vitamin B-12 (CYANOCOBALAMIN) 1000 MCG tablet Take 1 tablet by mouth daily.    . citalopram (CELEXA) 40 MG tablet TAKE 1 TABLET BY MOUTH DAILY 90 tablet 1  . doxycycline (VIBRAMYCIN) 100 MG capsule Take 100 mg by mouth daily. For skin issues      No results found for this or any previous visit (from the past 48 hour(s)).  No results found.   A comprehensive review of systems was negative.  Blood pressure (!) 152/90, pulse 71, temperature (!) 97.1 F (36.2 C), temperature source Oral, resp. rate 15, height 6\' 2"  (1.88 m), weight 112.2 kg, SpO2 100 %.  General appearance: alert, cooperative and appears stated age Head: Normocephalic, without obvious abnormality, atraumatic Neck: supple, symmetrical, trachea midline Cardio: regular rate and rhythm Resp: clear to auscultation bilaterally Extremities: Intact sensation and capillary refill all digits.  +epl/fpl/io.  No wounds.  Pulses: 2+ and  symmetric Skin: Skin color, texture, turgor normal. No rashes or lesions Neurologic: Grossly normal Incision/Wound: none  Assessment/Plan Right thumb ulnar collateral ligament injury.  Non operative and operative treatment options have been discussed with the patient and patient wishes to proceed with operative treatment. Risks, benefits, and alternatives of surgery have been discussed and the patient agrees with the plan of care.   Leanora Cover 04/17/2019, 11:46 AM

## 2019-04-17 NOTE — Op Note (Addendum)
NAME: Corey Bell MEDICAL RECORD NO: [id number] DATE OF BIRTH: 1966-05-29 FACILITY: [facility] LOCATION: [facility] PHYSICIAN: Tennis Must, MD   OPERATIVE REPORT   DATE OF PROCEDURE: 04/17/19    PREOPERATIVE DIAGNOSIS:   Right thumb MP joint ulnar collateral ligament injury   POSTOPERATIVE DIAGNOSIS:  Right thumb MP joint ulnar collateral ligament injury   PROCEDURE:   Right thumb MP joint ulnar collateral ligament repair   SURGEON:  Leanora Cover, M.D.   ASSISTANT: Daryll Brod, MD   ANESTHESIA:  General   INTRAVENOUS FLUIDS:  Per anesthesia flow sheet.   ESTIMATED BLOOD LOSS:  Minimal.   COMPLICATIONS:  None.   SPECIMENS:  none   TOURNIQUET TIME:    Total Tourniquet Time Documented: Upper Arm (Right) - 38 minutes Total: Upper Arm (Right) - 38 minutes    DISPOSITION:  Stable to PACU.   INDICATIONS: 53 year old male sustained injury to his right thumb ulnar collateral ligament at the MP joint.  This is confirmed on MRI.  He wishes to proceed with operative repair. Risks, benefits and alternatives of surgery were discussed including the risks of blood loss, infection, damage to nerves, vessels, tendons, ligaments, bone for surgery, need for additional surgery, complications with wound healing, continued pain, stiffness.  He voiced understanding of these risks and elected to proceed.  OPERATIVE COURSE:  After being identified preoperatively by myself,  the patient and I agreed on the procedure and site of the procedure.  The surgical site was marked.  Surgical consent had been signed. He was given IV antibiotics as preoperative antibiotic prophylaxis. He was transferred to the operating room and placed on the operating table in supine position with the Right upper extremity on an arm board.  General anesthesia was induced by the anesthesiologist.  Right upper extremity was prepped and draped in normal sterile orthopedic fashion.  A surgical pause was performed  between the surgeons, anesthesia, and operating room staff and all were in agreement as to the patient, procedure, and site of procedure.  Tourniquet at the proximal aspect of the extremity was inflated to 250 mmHg after exsanguination of the arm with an Esmarch bandage.    Incision was made on the dorsum of the thumb at the MP joint.  This is carried in subcutaneous tissues by spreading technique.  Bipolar electrocautery was used to obtain hemostasis.  There was thickened scar at the ulnar side of the MP joint.  The dorsal branch of the radial nerve was identified and protected throughout the case.  The abductor aponeurosis was incised along with the capsule.  The remaining portion of the collateral ligament was identified.  This had flipped behind the abductor aponeurosis.  It was freed up and brought back down toward the footprint at the proximal phalanx.  The footprint was cleared with the house curette.  A 1.0 mm juggernaut suture anchor was placed into the proximal phalanx at the footprint using standard technique.  This was then sewn in a horizontal mattress fashion through the ulnar collateral ligament.  An additional 1.4 mm juggernaut suture anchor was placed proximal to the ligament footprint in the metacarpal.  This was used to augment the repair and was sewn through distal soft tissues at the proximal phalanx.  Good repair was obtained.  Stress on the ulnar side of the MP joint showed no laxity at this point.  The wound was copiously irrigated with sterile saline.  The abductor aponeurosis was repaired with a running 4-0 Mersilene  suture.  The skin was closed with 4-0 nylon in a horizontal mattress fashion.  The area was injected with quarter percent plain Marcaine to aid in postoperative analgesia.  The wound was dressed with sterile Xeroform 4 x 4's and wrapped with a Kerlix bandage.  A thumb spica splint was placed and wrapped with Kerlix and Ace bandage.  The tourniquet was deflated at 38 minutes.   Fingertips were pink with brisk capillary refill after deflation of tourniquet.  The operative  drapes were broken down.  The patient was awoken from anesthesia safely.  He was transferred back to the stretcher and taken to PACU in stable condition.  I will see him back in the office in 1 week for postoperative followup.  I will give him a prescription for Percocet 5/325 1-2 tabs PO q6 hours prn pain, dispense # 20.   Leanora Cover, MD Electronically signed, 04/17/19

## 2019-04-18 ENCOUNTER — Encounter (HOSPITAL_BASED_OUTPATIENT_CLINIC_OR_DEPARTMENT_OTHER): Payer: Self-pay | Admitting: Orthopedic Surgery

## 2019-04-18 NOTE — Anesthesia Postprocedure Evaluation (Signed)
Anesthesia Post Note  Patient: Corey Bell  Procedure(s) Performed: RIGHT THUMB ULNAR COLLATERAL LIGAMENT REPAIR (Right Hand)     Patient location during evaluation: PACU Anesthesia Type: General Level of consciousness: awake and alert Pain management: pain level controlled Vital Signs Assessment: post-procedure vital signs reviewed and stable Respiratory status: spontaneous breathing, nonlabored ventilation and respiratory function stable Cardiovascular status: blood pressure returned to baseline and stable Postop Assessment: no apparent nausea or vomiting Anesthetic complications: no    Last Vitals:  Vitals:   04/17/19 1530 04/17/19 1549  BP: 130/79 (!) 142/81  Pulse: 62 (!) 58  Resp: 12 16  Temp:  36.5 C  SpO2: 99% 99%    Last Pain:  Vitals:   04/18/19 0906  TempSrc:   PainSc: [place]

## 2019-04-22 DIAGNOSIS — F4322 Adjustment disorder with anxiety: Secondary | ICD-10-CM | POA: Diagnosis not present

## 2019-04-24 ENCOUNTER — Encounter: Payer: Self-pay | Admitting: Family Medicine

## 2019-04-25 DIAGNOSIS — M25641 Stiffness of right hand, not elsewhere classified: Secondary | ICD-10-CM | POA: Diagnosis not present

## 2019-04-25 DIAGNOSIS — S6991XD Unspecified injury of right wrist, hand and finger(s), subsequent encounter: Secondary | ICD-10-CM | POA: Diagnosis not present
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
## 2019-04-28 ENCOUNTER — Ambulatory Visit (INDEPENDENT_AMBULATORY_CARE_PROVIDER_SITE_OTHER): Payer: PPO | Admitting: Family Medicine

## 2019-04-28 DIAGNOSIS — F419 Anxiety disorder, unspecified: Secondary | ICD-10-CM | POA: Diagnosis not present

## 2019-04-28 DIAGNOSIS — E119 Type 2 diabetes mellitus without complications: Secondary | ICD-10-CM

## 2019-04-28 DIAGNOSIS — F329 Major depressive disorder, single episode, unspecified: Secondary | ICD-10-CM | POA: Diagnosis not present

## 2019-04-28 DIAGNOSIS — F32A Depression, unspecified: Secondary | ICD-10-CM

## 2019-04-28 NOTE — Assessment & Plan Note (Signed)
Has been been diet controlled.  Prefers to wait to have labs checked in 3 months which is reasonable, diet controlled and has lost weight.
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
## 2019-05-06 DIAGNOSIS — F4322 Adjustment disorder with anxiety: Secondary | ICD-10-CM | POA: Diagnosis not present

## 2019-05-08 ENCOUNTER — Other Ambulatory Visit: Payer: Self-pay | Admitting: Family Medicine

## 2019-05-13 ENCOUNTER — Encounter: Payer: Self-pay | Admitting: Family Medicine

## 2019-05-13 DIAGNOSIS — C44519 Basal cell carcinoma of skin of other part of trunk: Secondary | ICD-10-CM | POA: Diagnosis not present

## 2019-05-13 DIAGNOSIS — L218 Other seborrheic dermatitis: Secondary | ICD-10-CM | POA: Diagnosis not present

## 2019-05-13 DIAGNOSIS — L821 Other seborrheic keratosis: Secondary | ICD-10-CM | POA: Diagnosis not present

## 2019-05-13 DIAGNOSIS — D225 Melanocytic nevi of trunk: Secondary | ICD-10-CM | POA: Diagnosis not present

## 2019-05-29 DIAGNOSIS — F4322 Adjustment disorder with anxiety: Secondary | ICD-10-CM | POA: Diagnosis not present

## 2019-06-02 ENCOUNTER — Other Ambulatory Visit: Payer: Self-pay | Admitting: Family Medicine

## 2019-06-03 DIAGNOSIS — S6991XD Unspecified injury of right wrist, hand and finger(s), subsequent encounter: Secondary | ICD-10-CM | POA: Diagnosis not present

## 2019-06-03 DIAGNOSIS — M25641 Stiffness of right hand, not elsewhere classified: Secondary | ICD-10-CM | POA: Diagnosis not present

## 2019-06-03 DIAGNOSIS — M25541 Pain in joints of right hand: Secondary | ICD-10-CM | POA: Diagnosis not present

## 2019-06-09 DIAGNOSIS — F4322 Adjustment disorder with anxiety: Secondary | ICD-10-CM | POA: Diagnosis not present

## 2019-06-10 DIAGNOSIS — L258 Unspecified contact dermatitis due to other agents: Secondary | ICD-10-CM | POA: Diagnosis not present

## 2019-06-10 DIAGNOSIS — Z85828 Personal history of other malignant neoplasm of skin: Secondary | ICD-10-CM | POA: Diagnosis not present

## 2019-06-10 DIAGNOSIS — Z08 Encounter for follow-up examination after completed treatment for malignant neoplasm: Secondary | ICD-10-CM | POA: Diagnosis not present

## 2019-06-17 DIAGNOSIS — M25641 Stiffness of right hand, not elsewhere classified: Secondary | ICD-10-CM | POA: Diagnosis not present

## 2019-06-17 DIAGNOSIS — M25541 Pain in joints of right hand: Secondary | ICD-10-CM | POA: Diagnosis not present

## 2019-06-17 DIAGNOSIS — S6991XD Unspecified injury of right wrist, hand and finger(s), subsequent encounter: Secondary | ICD-10-CM | POA: Diagnosis not present

## 2019-07-28 DIAGNOSIS — S6991XD Unspecified injury of right wrist, hand and finger(s), subsequent encounter: Secondary | ICD-10-CM | POA: Diagnosis not present

## 2019-07-28 DIAGNOSIS — M79644 Pain in right finger(s): Secondary | ICD-10-CM | POA: Diagnosis not present

## 2019-07-29 IMAGING — DX DG ABDOMEN 1V
2 series · 2 of 2 positions shown · non-contrast
Comparison: 04/26/2017.

CLINICAL DATA: Substernal mass for 2 weeks.

EXAM:
ABDOMEN - 1 VIEW

[abdomen kub (1 of 2)]
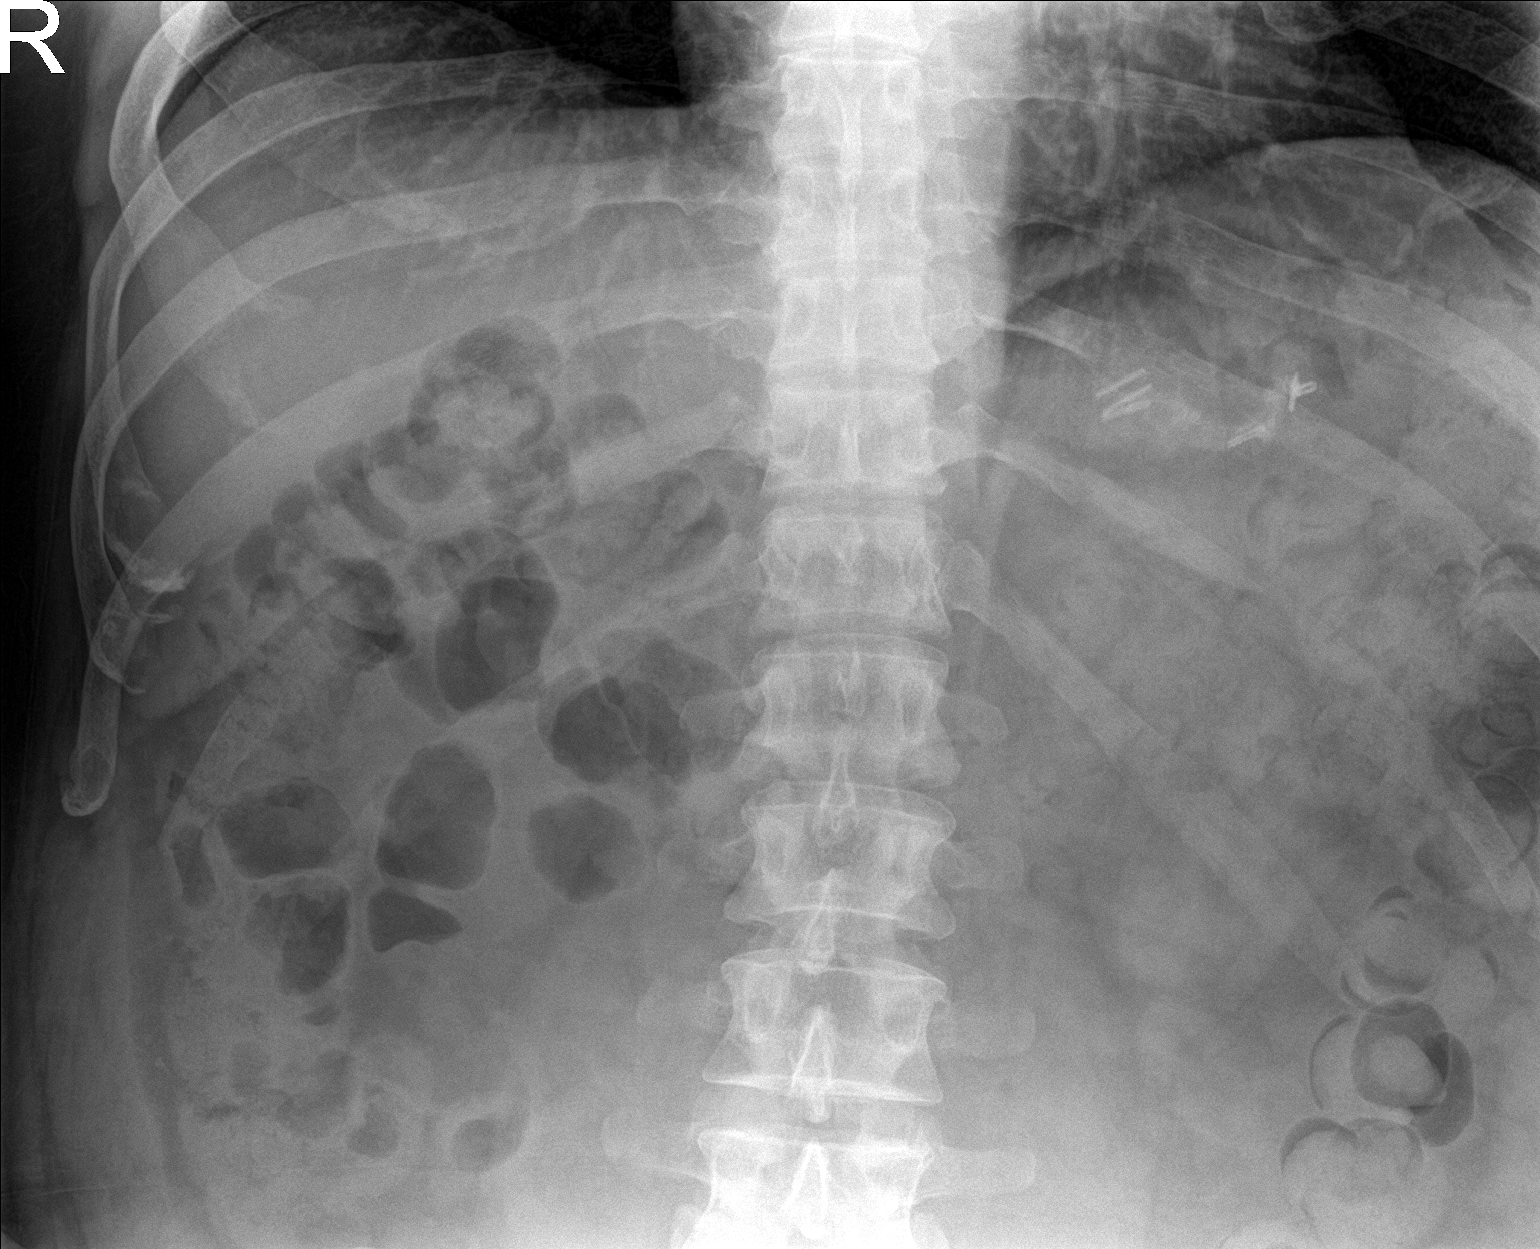

[abdomen kub (2 of 2)]
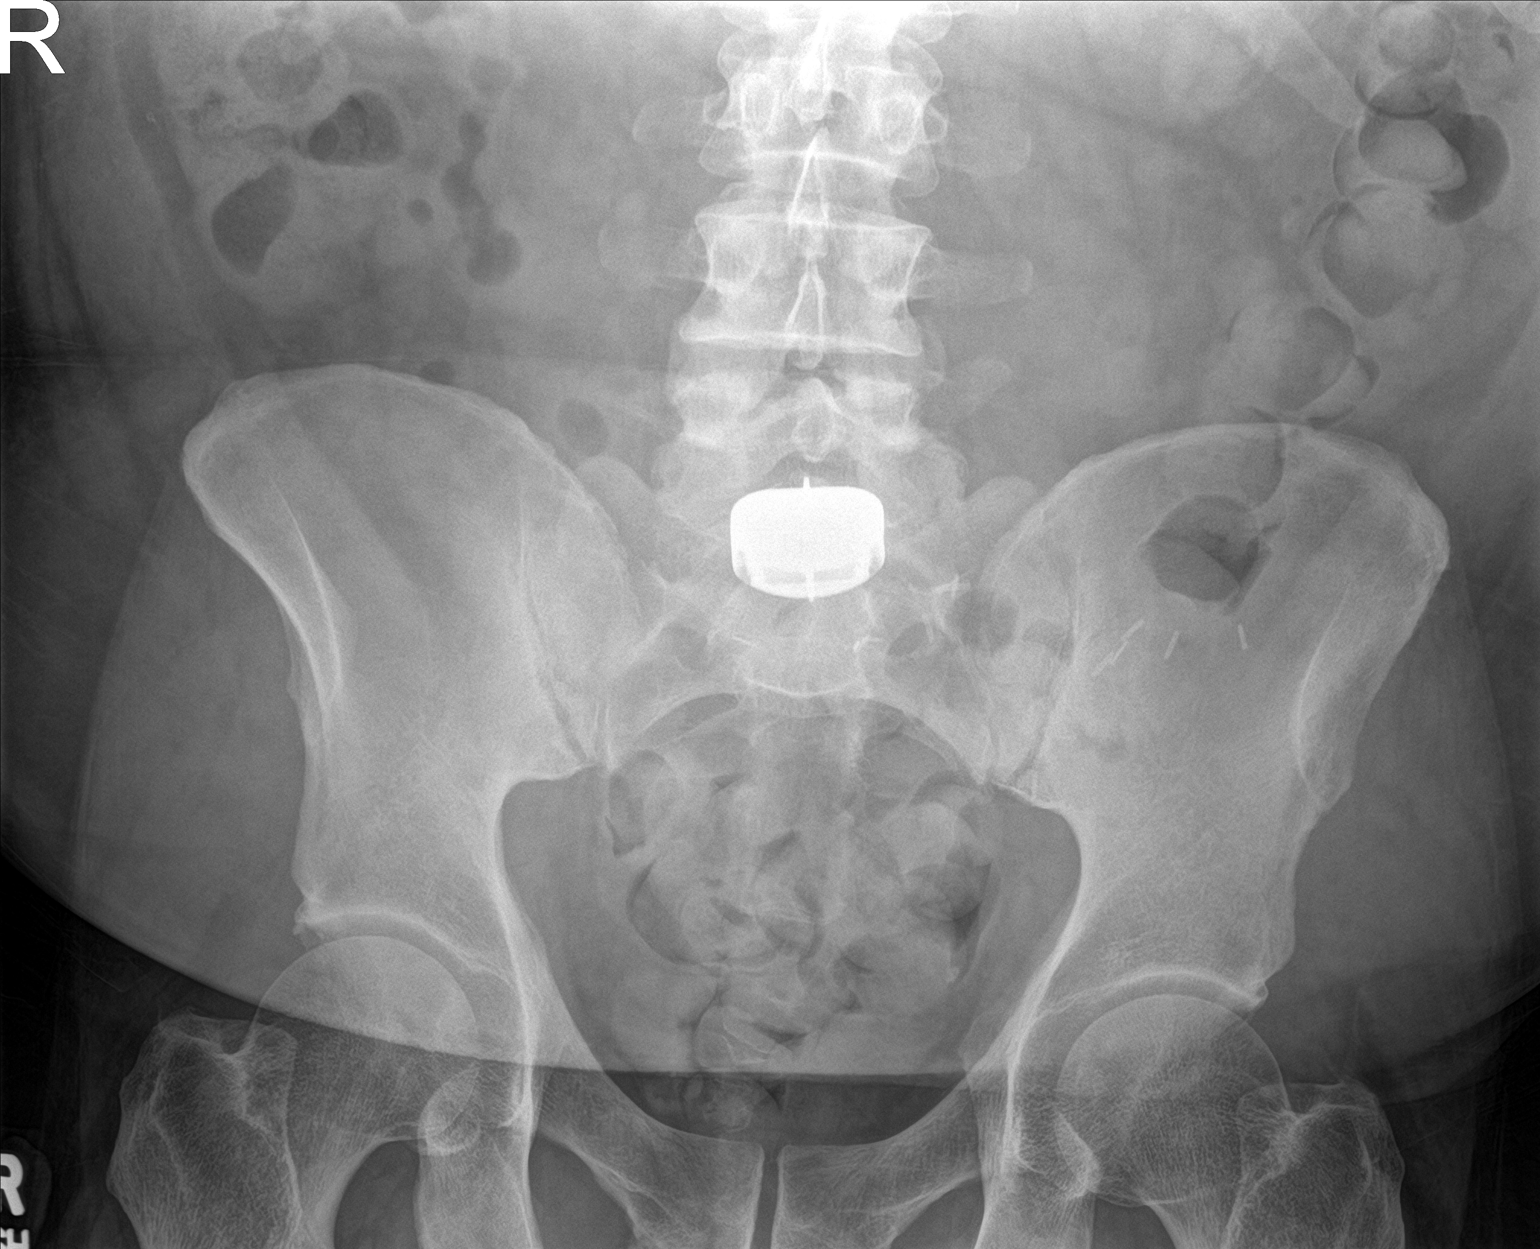

[2 of 2 positions shown; findings below may reference images not displayed]

FINDINGS: Surgical clips left upper abdomen. Surgical clips left pelvis. No
bowel distention. Stool noted throughout the colon. No free air.
Prior lumbosacral spine fusion.
IMPRESSION: Stool noted throughout the colon. No bowel distention. No acute
abnormality identified.

## 2019-08-11 ENCOUNTER — Ambulatory Visit: Payer: PPO | Admitting: Cardiovascular Disease

## 2019-08-15 DIAGNOSIS — M79644 Pain in right finger(s): Secondary | ICD-10-CM | POA: Diagnosis not present

## 2019-08-15 DIAGNOSIS — M79641 Pain in right hand: Secondary | ICD-10-CM | POA: Diagnosis not present

## 2019-08-19 ENCOUNTER — Encounter: Payer: Self-pay | Admitting: Family Medicine

## 2019-08-19 MED ORDER — TAMSULOSIN HCL 0.4 MG PO CAPS: 0.4000 mg | ORAL_CAPSULE | Freq: Every day | ORAL | 0 refills | Status: DC

## 2019-08-19 NOTE — Telephone Encounter (Signed)
Last OV 04/28/19 Last fill 09/23/18 Please advise.

## 2019-08-26 DIAGNOSIS — S6991XD Unspecified injury of right wrist, hand and finger(s), subsequent encounter: Secondary | ICD-10-CM | POA: Diagnosis not present

## 2019-08-30 ENCOUNTER — Encounter: Payer: Self-pay | Admitting: Family Medicine

## 2019-09-02 MED ORDER — TAMSULOSIN HCL 0.4 MG PO CAPS: 0.8000 mg | ORAL_CAPSULE | Freq: Every day | ORAL | 0 refills | Status: DC

## 2019-09-17 ENCOUNTER — Other Ambulatory Visit: Payer: Self-pay | Admitting: Orthopedic Surgery

## 2019-09-17 ENCOUNTER — Other Ambulatory Visit: Payer: Self-pay

## 2019-09-17 MED ORDER — TAMSULOSIN HCL 0.4 MG PO CAPS: 0.8000 mg | ORAL_CAPSULE | Freq: Every day | ORAL | 0 refills | Status: DC

## 2019-09-17 NOTE — Telephone Encounter (Signed)
Last OV 04/28/2019 Last fill 09/02/19  #30/0

## 2019-09-25 ENCOUNTER — Encounter: Payer: Self-pay | Admitting: Family Medicine

## 2019-10-04 ENCOUNTER — Encounter: Payer: Self-pay | Admitting: Family Medicine

## 2019-10-04 ENCOUNTER — Other Ambulatory Visit: Payer: Self-pay | Admitting: Family Medicine

## 2019-10-06 ENCOUNTER — Other Ambulatory Visit: Payer: Self-pay

## 2019-10-06 DIAGNOSIS — R3911 Hesitancy of micturition: Secondary | ICD-10-CM

## 2019-10-06 MED ORDER — TAMSULOSIN HCL 0.4 MG PO CAPS: 0.4000 mg | ORAL_CAPSULE | Freq: Two times a day (BID) | ORAL | 0 refills | Status: DC

## 2019-10-09 ENCOUNTER — Other Ambulatory Visit: Payer: Self-pay

## 2019-10-09 ENCOUNTER — Encounter (HOSPITAL_BASED_OUTPATIENT_CLINIC_OR_DEPARTMENT_OTHER): Payer: Self-pay | Admitting: Orthopedic Surgery

## 2019-10-09 ENCOUNTER — Telehealth: Payer: Self-pay | Admitting: Cardiovascular Disease

## 2019-10-09 NOTE — Telephone Encounter (Signed)
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
## 2019-10-13 ENCOUNTER — Other Ambulatory Visit: Payer: Self-pay

## 2019-10-13 ENCOUNTER — Ambulatory Visit: Payer: PPO | Admitting: Cardiovascular Disease

## 2019-10-13 ENCOUNTER — Telehealth (INDEPENDENT_AMBULATORY_CARE_PROVIDER_SITE_OTHER): Payer: PPO | Admitting: Cardiovascular Disease

## 2019-10-13 ENCOUNTER — Other Ambulatory Visit (HOSPITAL_COMMUNITY)
Admission: RE | Admit: 2019-10-13 | Discharge: 2019-10-13 | Disposition: A | Discharge status: Home / Self Care | Payer: PPO | Source: Ambulatory Visit | Attending: Orthopedic Surgery | Admitting: Orthopedic Surgery

## 2019-10-13 ENCOUNTER — Encounter: Payer: Self-pay | Admitting: Cardiovascular Disease

## 2019-10-13 VITALS — BP 144/70 | HR 54 | Ht 74.0 in | Wt 246.0 lb

## 2019-10-13 DIAGNOSIS — E119 Type 2 diabetes mellitus without complications: Secondary | ICD-10-CM

## 2019-10-13 DIAGNOSIS — R0600 Dyspnea, unspecified: Secondary | ICD-10-CM

## 2019-10-13 DIAGNOSIS — Z01812 Encounter for preprocedural laboratory examination: Secondary | ICD-10-CM | POA: Diagnosis not present

## 2019-10-13 DIAGNOSIS — Z20822 Contact with and (suspected) exposure to covid-19: Secondary | ICD-10-CM | POA: Diagnosis not present

## 2019-10-13 DIAGNOSIS — F419 Anxiety disorder, unspecified: Secondary | ICD-10-CM | POA: Diagnosis not present

## 2019-10-13 DIAGNOSIS — R0609 Other forms of dyspnea: Secondary | ICD-10-CM

## 2019-10-13 DIAGNOSIS — J449 Chronic obstructive pulmonary disease, unspecified: Secondary | ICD-10-CM | POA: Diagnosis not present

## 2019-10-13 DIAGNOSIS — E782 Mixed hyperlipidemia: Secondary | ICD-10-CM

## 2019-10-13 DIAGNOSIS — I10 Essential (primary) hypertension: Secondary | ICD-10-CM

## 2019-10-13 NOTE — Patient Instructions (Signed)

## 2019-10-14 ENCOUNTER — Encounter: Payer: Self-pay | Admitting: Family Medicine

## 2019-10-15 LAB — NOVEL CORONAVIRUS, NAA (HOSP ORDER, SEND-OUT TO REF LAB; TAT 18-24 HRS): SARS-CoV-2, NAA: NOT DETECTED

## 2019-10-15 NOTE — Telephone Encounter (Signed)
I spoke with Eli Hose today.  Please see TE.

## 2019-10-16 ENCOUNTER — Ambulatory Visit (HOSPITAL_BASED_OUTPATIENT_CLINIC_OR_DEPARTMENT_OTHER)
Admission: RE | Admit: 2019-10-16 | Discharge: 2019-10-16 | Disposition: A | Discharge status: Home / Self Care | Payer: PPO | Attending: Orthopedic Surgery | Admitting: Orthopedic Surgery

## 2019-10-16 ENCOUNTER — Encounter (HOSPITAL_BASED_OUTPATIENT_CLINIC_OR_DEPARTMENT_OTHER): Payer: Self-pay | Admitting: Orthopedic Surgery

## 2019-10-16 ENCOUNTER — Ambulatory Visit (HOSPITAL_BASED_OUTPATIENT_CLINIC_OR_DEPARTMENT_OTHER): Payer: PPO | Admitting: Anesthesiology

## 2019-10-16 ENCOUNTER — Encounter (HOSPITAL_BASED_OUTPATIENT_CLINIC_OR_DEPARTMENT_OTHER)
Admission: RE | Disposition: A | Discharge status: Home / Self Care | Payer: Self-pay | Source: Home / Self Care | Attending: Orthopedic Surgery

## 2019-10-16 ENCOUNTER — Other Ambulatory Visit: Payer: Self-pay

## 2019-10-16 DIAGNOSIS — E039 Hypothyroidism, unspecified: Secondary | ICD-10-CM | POA: Insufficient documentation

## 2019-10-16 DIAGNOSIS — F329 Major depressive disorder, single episode, unspecified: Secondary | ICD-10-CM | POA: Insufficient documentation

## 2019-10-16 DIAGNOSIS — Z87891 Personal history of nicotine dependence: Secondary | ICD-10-CM | POA: Insufficient documentation

## 2019-10-16 DIAGNOSIS — S63601A Unspecified sprain of right thumb, initial encounter: Secondary | ICD-10-CM | POA: Diagnosis not present

## 2019-10-16 DIAGNOSIS — K219 Gastro-esophageal reflux disease without esophagitis: Secondary | ICD-10-CM | POA: Diagnosis not present

## 2019-10-16 DIAGNOSIS — Z9884 Bariatric surgery status: Secondary | ICD-10-CM | POA: Insufficient documentation

## 2019-10-16 DIAGNOSIS — M5137 Other intervertebral disc degeneration, lumbosacral region: Secondary | ICD-10-CM | POA: Diagnosis not present

## 2019-10-16 DIAGNOSIS — G894 Chronic pain syndrome: Secondary | ICD-10-CM | POA: Insufficient documentation

## 2019-10-16 DIAGNOSIS — F4310 Post-traumatic stress disorder, unspecified: Secondary | ICD-10-CM | POA: Insufficient documentation

## 2019-10-16 DIAGNOSIS — S63418A Traumatic rupture of collateral ligament of other finger at metacarpophalangeal and interphalangeal joint, initial encounter: Secondary | ICD-10-CM | POA: Insufficient documentation

## 2019-10-16 DIAGNOSIS — M1990 Unspecified osteoarthritis, unspecified site: Secondary | ICD-10-CM | POA: Diagnosis not present

## 2019-10-16 DIAGNOSIS — E119 Type 2 diabetes mellitus without complications: Secondary | ICD-10-CM | POA: Insufficient documentation

## 2019-10-16 DIAGNOSIS — I10 Essential (primary) hypertension: Secondary | ICD-10-CM | POA: Insufficient documentation

## 2019-10-16 DIAGNOSIS — G4733 Obstructive sleep apnea (adult) (pediatric): Secondary | ICD-10-CM | POA: Diagnosis not present

## 2019-10-16 DIAGNOSIS — E782 Mixed hyperlipidemia: Secondary | ICD-10-CM | POA: Insufficient documentation

## 2019-10-16 DIAGNOSIS — J449 Chronic obstructive pulmonary disease, unspecified: Secondary | ICD-10-CM | POA: Diagnosis not present

## 2019-10-16 DIAGNOSIS — F419 Anxiety disorder, unspecified: Secondary | ICD-10-CM | POA: Insufficient documentation

## 2019-10-16 DIAGNOSIS — Z79899 Other long term (current) drug therapy: Secondary | ICD-10-CM | POA: Diagnosis not present

## 2019-10-16 DIAGNOSIS — X58XXXA Exposure to other specified factors, initial encounter: Secondary | ICD-10-CM | POA: Insufficient documentation

## 2019-10-16 DIAGNOSIS — Z79890 Hormone replacement therapy (postmenopausal): Secondary | ICD-10-CM | POA: Insufficient documentation

## 2019-10-16 DIAGNOSIS — S63641A Sprain of metacarpophalangeal joint of right thumb, initial encounter: Secondary | ICD-10-CM | POA: Diagnosis not present

## 2019-10-16 HISTORY — PX: ULNAR COLLATERAL LIGAMENT REPAIR: SHX6159

## 2019-10-16 SURGERY — REPAIR, LIGAMENT, ULNAR COLLATERAL
Anesthesia: Monitor Anesthesia Care | Site: Hand | Laterality: Right

## 2019-10-16 MED ORDER — OXYCODONE HCL 5 MG/5ML PO SOLN: 5.0000 mg | Freq: Once | ORAL | Status: DC | PRN

## 2019-10-16 MED ORDER — MIDAZOLAM HCL 2 MG/2ML IJ SOLN
INTRAMUSCULAR | Status: AC
  Filled 2019-10-16: qty 2

## 2019-10-16 MED ORDER — MIDAZOLAM HCL 2 MG/2ML IJ SOLN
1.0000 mg | INTRAMUSCULAR | Status: DC | PRN
  Administered 2019-10-16: 12:00:00 2 mg via INTRAVENOUS

## 2019-10-16 MED ORDER — ONDANSETRON HCL 4 MG/2ML IJ SOLN
INTRAMUSCULAR | Status: DC | PRN
  Administered 2019-10-16: 4 mg via INTRAVENOUS

## 2019-10-16 MED ORDER — OXYCODONE HCL 5 MG PO TABS: 5.0000 mg | ORAL_TABLET | Freq: Once | ORAL | Status: DC | PRN

## 2019-10-16 MED ORDER — ONDANSETRON HCL 4 MG/2ML IJ SOLN
INTRAMUSCULAR | Status: AC
  Filled 2019-10-16: qty 2

## 2019-10-16 MED ORDER — PROMETHAZINE HCL 25 MG/ML IJ SOLN: 6.2500 mg | INTRAMUSCULAR | Status: DC | PRN

## 2019-10-16 MED ORDER — VANCOMYCIN HCL IN DEXTROSE 500-5 MG/100ML-% IV SOLN
INTRAVENOUS | Status: AC
  Filled 2019-10-16: qty 100

## 2019-10-16 MED ORDER — VANCOMYCIN HCL IN DEXTROSE 1-5 GM/200ML-% IV SOLN: 1000.0000 mg | INTRAVENOUS | Status: DC

## 2019-10-16 MED ORDER — PROPOFOL 500 MG/50ML IV EMUL
INTRAVENOUS | Status: DC | PRN
  Administered 2019-10-16: 75 ug/kg/min via INTRAVENOUS

## 2019-10-16 MED ORDER — ROPIVACAINE HCL 5 MG/ML IJ SOLN
INTRAMUSCULAR | Status: DC | PRN
  Administered 2019-10-16: 15 mL via PERINEURAL

## 2019-10-16 MED ORDER — KETOROLAC TROMETHAMINE 30 MG/ML IJ SOLN: 30.0000 mg | Freq: Once | INTRAMUSCULAR | Status: DC | PRN

## 2019-10-16 MED ORDER — VANCOMYCIN HCL 1500 MG/300ML IV SOLN
1500.0000 mg | Freq: Once | INTRAVENOUS | Status: AC
  Administered 2019-10-16: 1500 mg via INTRAVENOUS

## 2019-10-16 MED ORDER — ACETAMINOPHEN 500 MG PO TABS
1000.0000 mg | ORAL_TABLET | Freq: Once | ORAL | Status: AC
  Administered 2019-10-16: 11:00:00 1000 mg via ORAL

## 2019-10-16 MED ORDER — LIDOCAINE 2% (20 MG/ML) 5 ML SYRINGE
INTRAMUSCULAR | Status: AC
  Filled 2019-10-16: qty 5

## 2019-10-16 MED ORDER — FENTANYL CITRATE (PF) 100 MCG/2ML IJ SOLN
50.0000 ug | INTRAMUSCULAR | Status: DC | PRN
  Administered 2019-10-16: 12:00:00 50 ug via INTRAVENOUS

## 2019-10-16 MED ORDER — PROPOFOL 10 MG/ML IV BOLUS
INTRAVENOUS | Status: AC
  Filled 2019-10-16: qty 20

## 2019-10-16 MED ORDER — LIDOCAINE HCL (CARDIAC) PF 100 MG/5ML IV SOSY
PREFILLED_SYRINGE | INTRAVENOUS | Status: DC | PRN
  Administered 2019-10-16: 5 mL via INTRAVENOUS

## 2019-10-16 MED ORDER — EPHEDRINE 5 MG/ML INJ
INTRAVENOUS | Status: AC
  Filled 2019-10-16: qty 10

## 2019-10-16 MED ORDER — CHLORHEXIDINE GLUCONATE 4 % EX LIQD: 60.0000 mL | Freq: Once | CUTANEOUS | Status: DC

## 2019-10-16 MED ORDER — FENTANYL CITRATE (PF) 100 MCG/2ML IJ SOLN
INTRAMUSCULAR | Status: AC
  Filled 2019-10-16: qty 2

## 2019-10-16 MED ORDER — VANCOMYCIN HCL IN DEXTROSE 1-5 GM/200ML-% IV SOLN
INTRAVENOUS | Status: AC
  Filled 2019-10-16: qty 200

## 2019-10-16 MED ORDER — HYDROMORPHONE HCL 1 MG/ML IJ SOLN: 0.2500 mg | INTRAMUSCULAR | Status: DC | PRN

## 2019-10-16 MED ORDER — LACTATED RINGERS IV SOLN: INTRAVENOUS | Status: DC

## 2019-10-16 MED ORDER — ACETAMINOPHEN 500 MG PO TABS
ORAL_TABLET | ORAL | Status: AC
  Filled 2019-10-16: qty 2

## 2019-10-16 SURGICAL SUPPLY — 71 items
ANCHOR SUT 1.45 SZ 1 SHORT (Anchor) ×3 IMPLANT
BLADE MINI RND TIP GREEN BEAV (BLADE) ×3 IMPLANT
BLADE SURG 15 STRL LF DISP TIS (BLADE) ×2 IMPLANT
BLADE SURG 15 STRL SS (BLADE) ×4
BNDG ELASTIC 2X5.8 VLCR STR LF (GAUZE/BANDAGES/DRESSINGS) IMPLANT
BNDG ELASTIC 3X5.8 VLCR STR LF (GAUZE/BANDAGES/DRESSINGS) IMPLANT
BNDG ESMARK 4X9 LF (GAUZE/BANDAGES/DRESSINGS) ×3 IMPLANT
BNDG GAUZE ELAST 4 BULKY (GAUZE/BANDAGES/DRESSINGS) ×3 IMPLANT
CATH ROBINSON RED A/P 8FR (CATHETERS) IMPLANT
CHLORAPREP W/TINT 26 (MISCELLANEOUS) ×3 IMPLANT
CORD BIPOLAR FORCEPS 12FT (ELECTRODE) ×3 IMPLANT
COVER BACK TABLE 60X90IN (DRAPES) ×3 IMPLANT
COVER MAYO STAND STRL (DRAPES) ×3 IMPLANT
COVER WAND RF STERILE (DRAPES) IMPLANT
CUFF TOURN SGL QUICK 18X4 (TOURNIQUET CUFF) ×3 IMPLANT
DECANTER SPIKE VIAL GLASS SM (MISCELLANEOUS) IMPLANT
DRAPE EXTREMITY T 121X128X90 (DISPOSABLE) ×3 IMPLANT
DRAPE OEC MINIVIEW 54X84 (DRAPES) IMPLANT
DRAPE SURG 17X23 STRL (DRAPES) ×3 IMPLANT
DRSG PAD ABDOMINAL 8X10 ST (GAUZE/BANDAGES/DRESSINGS) IMPLANT
GAUZE SPONGE 4X4 12PLY STRL (GAUZE/BANDAGES/DRESSINGS) ×3 IMPLANT
GAUZE XEROFORM 1X8 LF (GAUZE/BANDAGES/DRESSINGS) ×3 IMPLANT
GLOVE BIO SURGEON STRL SZ7.5 (GLOVE) ×3 IMPLANT
GLOVE BIOGEL PI IND STRL 8 (GLOVE) ×1 IMPLANT
GLOVE BIOGEL PI IND STRL 8.5 (GLOVE) ×1 IMPLANT
GLOVE BIOGEL PI INDICATOR 8 (GLOVE) ×2
GLOVE BIOGEL PI INDICATOR 8.5 (GLOVE) ×2
GLOVE SURG ORTHO 8.0 STRL STRW (GLOVE) IMPLANT
GOWN STRL REUS W/ TWL LRG LVL3 (GOWN DISPOSABLE) ×1 IMPLANT
GOWN STRL REUS W/TWL LRG LVL3 (GOWN DISPOSABLE) ×2
GOWN STRL REUS W/TWL XL LVL3 (GOWN DISPOSABLE) ×3 IMPLANT
K-WIRE .035X4 (WIRE) IMPLANT
NDL SAFETY ECLIPSE 18X1.5 (NEEDLE) IMPLANT
NEEDLE HYPO 18GX1.5 SHARP (NEEDLE)
NEEDLE HYPO 22GX1.5 SAFETY (NEEDLE) IMPLANT
NEEDLE HYPO 25X1 1.5 SAFETY (NEEDLE) IMPLANT
NEEDLE KEITH (NEEDLE) IMPLANT
NS IRRIG 1000ML POUR BTL (IV SOLUTION) ×3 IMPLANT
PACK BASIN DAY SURGERY FS (CUSTOM PROCEDURE TRAY) ×3 IMPLANT
PAD CAST 3X4 CTTN HI CHSV (CAST SUPPLIES) ×1 IMPLANT
PAD CAST 4YDX4 CTTN HI CHSV (CAST SUPPLIES) IMPLANT
PADDING CAST ABS 4INX4YD NS (CAST SUPPLIES) ×2
PADDING CAST ABS COTTON 4X4 ST (CAST SUPPLIES) ×1 IMPLANT
PADDING CAST COTTON 3X4 STRL (CAST SUPPLIES) ×2
PADDING CAST COTTON 4X4 STRL (CAST SUPPLIES)
PASSER SUT SWANSON 36MM LOOP (INSTRUMENTS) IMPLANT
SLEEVE SCD COMPRESS KNEE MED (MISCELLANEOUS) ×3 IMPLANT
SLING ARM FOAM STRAP LRG (SOFTGOODS) IMPLANT
SPLINT FAST PLASTER 5X30 (CAST SUPPLIES)
SPLINT PLASTER CAST FAST 5X30 (CAST SUPPLIES) IMPLANT
SPLINT PLASTER CAST XFAST 3X15 (CAST SUPPLIES) IMPLANT
SPLINT PLASTER XTRA FASTSET 3X (CAST SUPPLIES)
STOCKINETTE 4X48 STRL (DRAPES) ×3 IMPLANT
SUT ETHIBOND 3-0 V-5 (SUTURE) IMPLANT
SUT ETHILON 3 0 PS 1 (SUTURE) IMPLANT
SUT ETHILON 4 0 PS 2 18 (SUTURE) IMPLANT
SUT FIBERWIRE 2-0 18 17.9 3/8 (SUTURE)
SUT FIBERWIRE 3-0 18 TAPR NDL (SUTURE)
SUT MERSILENE 2.0 SH NDLE (SUTURE) IMPLANT
SUT MERSILENE 4 0 P 3 (SUTURE) IMPLANT
SUT SILK 4 0 PS 2 (SUTURE) IMPLANT
SUT VIC AB 0 SH 27 (SUTURE) IMPLANT
SUT VIC AB 2-0 SH 27 (SUTURE)
SUT VIC AB 2-0 SH 27XBRD (SUTURE) IMPLANT
SUT VICRYL 4-0 PS2 18IN ABS (SUTURE) IMPLANT
SUTURE FIBERWR 2-0 18 17.9 3/8 (SUTURE) IMPLANT
SUTURE FIBERWR 3-0 18 TAPR NDL (SUTURE) IMPLANT
SYR BULB 3OZ (MISCELLANEOUS) ×3 IMPLANT
SYR CONTROL 10ML LL (SYRINGE) IMPLANT
TOWEL GREEN STERILE FF (TOWEL DISPOSABLE) ×6 IMPLANT
UNDERPAD 30X36 HEAVY ABSORB (UNDERPADS AND DIAPERS) ×3 IMPLANT

## 2019-10-16 NOTE — Transfer of Care (Signed)
Immediate Anesthesia Transfer of Care Note  Patient: Corey Bell  Procedure(s) Performed: RIGHT THUMB ULNAR COLLATERAL LIGAMENT RECONSTRUCTION (Right Hand)  Patient Location: PACU  Anesthesia Type:MAC combined with regional for post-op pain  Level of Consciousness: awake, drowsy and patient cooperative  Airway & Oxygen Therapy: Patient Spontanous Breathing and Patient connected to face mask oxygen  Post-op Assessment: Report given to RN and Post -op Vital signs reviewed and stable  Post vital signs: Reviewed and stable  Last Vitals:  Vitals Value Taken Time  BP    Temp    Pulse 52 10/16/19 1334  Resp 15 10/16/19 1334  SpO2 97 % 10/16/19 1334  Vitals shown include unvalidated device data.  Last Pain:  Vitals:   10/16/19 1036  TempSrc: Temporal  PainSc: 0-No pain      Patients Stated Pain Goal: 5 (67/34/19 3790)  Complications: No apparent anesthesia complications

## 2019-10-16 NOTE — H&P (Signed)
Corey Bell is an 54 y.o. male.   Chief Complaint: right thumb collateral ligament injury HPI: 54 yo male with rupture right thumb ulnar collateral ligament after previous repair.  Continued pain in thumb after splinting.  He wishes to proceed with right thumb ulnar collateral ligament reconstruction.  Allergies:  Allergies  Allergen Reactions  . Augmentin [Amoxicillin-Pot Clavulanate] Diarrhea  . Demerol [Meperidine] Other (See Comments)    "goes crazy and has the strength of 10 men"  . Eggs Or Egg-Derived Products Diarrhea and Nausea And Vomiting  . Sulfamethoxazole Other (See Comments)    Blisters form in mouth  . Statins Rash  . Sulfa Antibiotics Rash  . Sulfacetamide Sodium Rash    Past Medical History:  Diagnosis Date  . Anxiety   . Aphthous ulcer 10-04-12   improving  . Arthritis    left index fnger  . Carpal tunnel syndrome, bilateral    damaged nerve in neck  . Chronic pain syndrome   . COPD (chronic obstructive pulmonary disease) ([facility])    mild  . Depression   . Diabetes mellitus without complication ([place])    type 2 , has not been medicated in over 1 year, no meds since Gastric sleeve  . Diverticulitis of colon   . Esophageal reflux   . H/O acute prostatitis   . Hypothyroidism   . Insomnia, unspecified   . Mixed hyperlipidemia   . Peyronie disease 12-20-12   PER uROLOGY  . Seasonal allergies   . Sleep apnea    uses cpap  . Tympanic membrane perforation    history of    Past Surgical History:  Procedure Laterality Date  . LAPAROSCOPIC GASTRIC SLEEVE RESECTION N/A 07/31/2017   Procedure: LAPAROSCOPIC GASTRIC SLEEVE RESECTION WITH UPPER ENDOSCOPY;  Surgeon: Alphonsa Overall, MD;  Location: [facility];  Service: General;  Laterality: N/A;  . LumbarSacral Disc Surgery     L5-S1  . MENISCUS REPAIR Left    torn meniscus  . NESBIT PROCEDURE N/A 08/22/2013   Procedure: 16 DOT PLACTATION;  Surgeon: Claybon Jabs, MD;  Location: [facility];   Service: Urology;  Laterality: N/A;  . TYMPANOSTOMY TUBE PLACEMENT  1975  . ULNAR COLLATERAL LIGAMENT REPAIR Right 04/17/2019   Procedure: RIGHT THUMB ULNAR COLLATERAL LIGAMENT REPAIR;  Surgeon: Leanora Cover, MD;  Location: [place];  Service: Orthopedics;  Laterality: Right;  Marland Kitchen VASECTOMY Bilateral 08/22/2013   Procedure: VASECTOMY;  Surgeon: Claybon Jabs, MD;  Location: [facility];  Service: Urology;  Laterality: Bilateral;    Family History: Family History  Problem Relation Age of Onset  . Congestive Heart Failure Father   . Heart disease Father   . COPD Father   . Renal cancer Paternal Grandfather        renal call carcinoma  . Heart disease Brother        stents at 17 yo  . Bladder Cancer Paternal Uncle   . Renal cancer Paternal Uncle        renal cell carcinoma  . Heart disease Other   . Parkinsonism Neg Hx     Social History:   reports that he quit smoking about 13 years ago. His smoking use included cigarettes. He has a 45.00 pack-year smoking history. He has never used smokeless tobacco. He reports current alcohol use of about 1.0 standard drinks of alcohol per week. He reports that he does not use drugs.  Medications: Medications Prior to Admission  Medication Sig  Dispense Refill  . buPROPion (WELLBUTRIN XL) 300 MG 24 hr tablet TAKE 1 TABLET BY MOUTH DAILY 30 tablet 5  . calcium carbonate (TUMS - DOSED IN MG ELEMENTAL CALCIUM) 500 MG chewable tablet Chew 3 tablets by mouth daily.    . citalopram (CELEXA) 40 MG tablet TAKE 1 TABLET BY MOUTH DAILY 90 tablet 1  . cyclobenzaprine (FLEXERIL) 10 MG tablet Take 1 tablet (10 mg total) by mouth at bedtime. 30 tablet 5  . diphenhydrAMINE (BENADRYL) 25 mg capsule Take 50 mg by mouth at bedtime.    Marland Kitchen doxycycline (VIBRAMYCIN) 100 MG capsule Take 100 mg by mouth daily as needed. For skin issues     . erythromycin with ethanol (THERAMYCIN) 2 % external solution APPLY TOPICALLY TWICE A DAY AS NEEDED 60  mL 2  . Ferrous Sulfate (IRON) 325 (65 Fe) MG TABS Take by mouth daily.     . fexofenadine (ALLEGRA) 180 MG tablet Take 180 mg by mouth daily.    Marland Kitchen gabapentin (NEURONTIN) 400 MG capsule Take 400 mg by mouth 4 (four) times daily.    Marland Kitchen ibuprofen (ADVIL,MOTRIN) 600 MG tablet Take 600 mg by mouth daily.     Marland Kitchen levothyroxine (SYNTHROID, LEVOTHROID) 75 MCG tablet Take 1 tablet (75 mcg total) by mouth daily. 30 tablet 11  . methocarbamol (ROBAXIN) 500 MG tablet TAKE 2 TABLETS BY MOUTH EVERY 6 HOURS ASNEEDED FOR MUSCLE SPASMS 120 tablet 5  . montelukast (SINGULAIR) 10 MG tablet Take 1 tablet (10 mg total) by mouth at bedtime. 90 tablet 3  . pantoprazole (PROTONIX) 40 MG tablet Take 1 tablet (40 mg total) by mouth daily. 90 tablet 3  . phenylephrine (EQ SUPHEDRINE PE) 10 MG TABS tablet 1q4-6 h prn    . Polyvinyl Alcohol (LIQUID TEARS OP) Place 1 drop into both eyes as needed (for dry eyes).    . Saw Palmetto 450 MG CAPS Take 4 capsules by mouth daily.     . tamsulosin (FLOMAX) 0.4 MG CAPS capsule Take 1 capsule (0.4 mg total) by mouth 2 (two) times daily. 60 capsule 0  . Thiamine HCl (VITAMIN B-1) 250 MG tablet Take 1 tablet by mouth daily.    . traMADol (ULTRAM) 50 MG tablet TAKE 1 TABLET BY MOUTH EVERY 8 HOURS AS NEEDED FOR MODERATE TO SEVEREPAIN 30 tablet 5  . Trolamine Salicylate (ASPERCREME EX) Apply 1 application topically as needed (for pain).    . vitamin B-12 (CYANOCOBALAMIN) 1000 MCG tablet Take 1 tablet by mouth daily.    Marland Kitchen oxyCODONE-acetaminophen (PERCOCET/ROXICET) 5-325 MG tablet Take 1 tablet by mouth every 6 (six) hours as needed for severe pain. 90 tablet 0    No results found for this or any previous visit (from the past 48 hour(s)).  No results found.   A comprehensive review of systems was negative.  Blood pressure 131/80, pulse 67, temperature (!) 97.1 F (36.2 C), temperature source Temporal, resp. rate 20, height 6\' 2"  (1.88 m), weight 113 kg, SpO2 99 %.  General appearance:  alert, cooperative and appears stated age Head: Normocephalic, without obvious abnormality, atraumatic Neck: supple, symmetrical, trachea midline Cardio: regular rate and rhythm Resp: clear to auscultation bilaterally Extremities: Intact sensation and capillary refill all digits.  +epl/fpl/io.  No wounds.  Pulses: 2+ and symmetric Skin: Skin color, texture, turgor normal. No rashes or lesions Neurologic: Grossly normal Incision/Wound: none  Assessment/Plan Right thumb ulnar collateral ligament repair with subsequent rerupture.  Plan right thumb ulnar collateral ligament reconstruction.  Non  operative and operative treatment options have been discussed with the patient and patient wishes to proceed with operative treatment. Risks, benefits, and alternatives of surgery have been discussed and the patient agrees with the plan of care.   Leanora Cover 10/16/2019, 11:41 AM

## 2019-10-16 NOTE — Op Note (Signed)
NAME: Corey Bell MEDICAL RECORD NO: [id number] DATE OF BIRTH: 1966/06/23 FACILITY: [facility] LOCATION: [facility] PHYSICIAN: Tennis Must, MD   OPERATIVE REPORT   DATE OF PROCEDURE: 10/16/19    PREOPERATIVE DIAGNOSIS:   Right thumb ulnar collateral ligament injury after repair   POSTOPERATIVE DIAGNOSIS:   Right thumb ulnar collateral ligament injury after repair   PROCEDURE:   Right thumb ulnar collateral ligament reconstruction with APL tendon transfer.   SURGEON:  Leanora Cover, M.D.   ASSISTANT: Daryll Brod, MD   ANESTHESIA:  Regional with sedation   INTRAVENOUS FLUIDS:  Per anesthesia flow sheet.   ESTIMATED BLOOD LOSS:  Minimal.   COMPLICATIONS:  None.   SPECIMENS:  none   TOURNIQUET TIME:    Total Tourniquet Time Documented: Upper Arm (Right) - 52 minutes Total: Upper Arm (Right) - 52 minutes    DISPOSITION:  Stable to PACU.   INDICATIONS: 54 year old male with all right thumb ulnar collateral ligament injury after a previous repair.  He has continued pain in the MP joint despite splinting.  He wishes to proceed with operative reconstruction of the right thumb ulnar collateral ligament. Risks, benefits and alternatives of surgery were discussed including the risks of blood loss, infection, damage to nerves, vessels, tendons, ligaments, bone for surgery, need for additional surgery, complications with wound healing, continued pain, stiffness.  He voiced understanding of these risks and elected to proceed.  OPERATIVE COURSE:  After being identified preoperatively by myself,  the patient and I agreed on the procedure and site of the procedure.  The surgical site was marked.  Surgical consent had been signed. He was given IV antibiotics as preoperative antibiotic prophylaxis. He was transferred to the operating room and placed on the operating table in supine position with the Right upper extremity on an arm board.  Sedation was induced by the  anesthesiologist. A regional block had been performed by anesthesia in preoperative holding.   Right upper extremity was prepped and draped in normal sterile orthopedic fashion.  A surgical pause was performed between the surgeons, anesthesia, and operating room staff and all were in agreement as to the patient, procedure, and site of procedure.  Tourniquet at the proximal aspect of the extremity was inflated to 250 mmHg after exsanguination of the arm with an Esmarch bandage.    Previous incision was followed on the dorsum of the thumb.  This carried in subtenons tissues by spreading technique.  Bipolar electrocautery was used to obtain hemostasis.  The abductor aponeurosis was sharply incised.   The previous suture anchor knots were removed.  The footprint of the collateral ligament was identified at the base of the proximal phalanx and at the distal aspect of the metacarpal.  A tunnel was made under the soft tissues over the distal aspect of the metacarpal.  An additional incision was made at the radial side of the wrist.  This was carried in subcutaneous tissues by spreading technique.  Bipolar electrocautery was used to obtain hemostasis.  The APL tendon was identified.  The ulnar most slip was selected.  This was attached to the bone.  This was then harvested proximally after releasing it with a wire.  The tendon was then passed back subcutaneously into the distal wound.  The proximal wound was copiously irrigated with sterile saline and closed with 4-0 nylon in a horizontal mattress fashion.  The tendon graft was placed through the previously made tunnel and across the joint at the ulnar side  to reconstruct the ulnar collateral ligament.  A juggernaut suture anchor was then placed in the base of the proximal phalanx at the footprint of the ligament.  This was used to secure the tendon graft distally.  Proximally it was then secured into the tunnel using a 4-0 Mersilene suture.  This provided good  reconstruction of the ulnar collateral ligament.  The abductor aponeurosis was then repaired with the 4-0 Mersilene suture in a running fashion.  The joint was stable to stressing of the ulnar collateral ligament with acceptable endpoint.  There was noted to be some degenerative change to the metacarpal head during the case.  There was a groove running transversely more toward the dorsum.  Skin was closed with 4-0 nylon in a horizontal mattress fashion.  The wound was dressed with sterile Xeroform 4 x 4's and wrapped with a Kerlix bandage.  A thumb spica splint was placed and wrapped with Kerlix and Ace bandage.  The tourniquet was deflated at 52 minutes.  Fingertips were pink with brisk capillary refill after deflation of tourniquet.  The operative  drapes were broken down.  The patient was awoken from anesthesia safely.  He was transferred back to the stretcher and taken to PACU in stable condition.  I will see him back in the office in 1 week for postoperative followup.  He states he has most of the pain medication from his previous surgery still at home and plans to use this for postoperative pain control if necessary.   Leanora Cover, MD Electronically signed, 10/16/19

## 2019-10-16 NOTE — Op Note (Signed)
I assisted Surgeon(s) and Role:    * Leanora Cover, MD - Primary    * Daryll Brod, MD - Assisting on the Procedure(s): RIGHT THUMB [place] on 10/16/2019.  I provided assistance on this case as follows: setup, approach, identification of the ruptured ligament, harvesting of the tendon for reconstruction, placement of the achor, closure of thr wound and application of the dressings and splints.  Electronically signed by: Daryll Brod, MD Date: 10/16/2019 Time: 1:32 PM

## 2019-10-16 NOTE — Anesthesia Preprocedure Evaluation (Addendum)
Anesthesia Evaluation  Patient identified by MRN, date of birth, ID band Patient awake    Reviewed: Allergy & Precautions, NPO status , Patient's Chart, lab work & pertinent test results  Airway Mallampati: II  TM Distance: >3 FB Neck ROM: Full    Dental no notable dental hx. (+) Dental Advisory Given, Teeth Intact   Pulmonary asthma , sleep apnea and Continuous Positive Airway Pressure Ventilation , COPD, former smoker,  Mild COPD, former smoker 45 pack year history, quit 2009   Pulmonary exam normal breath sounds clear to auscultation       Cardiovascular Normal cardiovascular exam Rhythm:Regular Rate:Normal  Was hypertensive prior to gastric sleeve, no longer on BP meds   Neuro/Psych PSYCHIATRIC DISORDERS Anxiety Depression negative neurological ROS     GI/Hepatic Neg liver ROS, GERD  Medicated and Controlled,  Endo/Other  diabetes, Type 2Hypothyroidism T2DM- no meds since gastric sleeve surgery  Renal/GU negative Renal ROS  negative genitourinary   Musculoskeletal  (+) Arthritis , Osteoarthritis,    Abdominal Normal abdominal exam  (+)   Peds negative pediatric ROS (+)  Hematology negative hematology ROS (+)   Anesthesia Other Findings Right thumb ulnar collateral ligament rupture  Reproductive/Obstetrics negative OB ROS                           Anesthesia Physical  Anesthesia Plan  ASA: III  Anesthesia Plan: MAC and Regional   Post-op Pain Management:  Regional for Post-op pain   Induction: Intravenous  PONV Risk Score and Plan: 1 and Treatment may vary due to age or medical condition and Ondansetron  Airway Management Planned: Natural Airway and Simple Face Mask  Additional Equipment: None  Intra-op Plan:   Post-operative Plan:   Informed Consent: I have reviewed the patients History and Physical, chart, labs and discussed the procedure including the risks, benefits and  alternatives for the proposed anesthesia with the patient or authorized representative who has indicated his/her understanding and acceptance.       Plan Discussed with: CRNA  Anesthesia Plan Comments:     Anesthesia Quick Evaluation

## 2019-10-16 NOTE — Anesthesia Procedure Notes (Signed)
Anesthesia Regional Block: Supraclavicular block   Pre-Anesthetic Checklist: ,, timeout performed, Correct Patient, Correct Site, Correct Laterality, Correct Procedure, Correct Position, site marked, Risks and benefits discussed,  Surgical consent,  Pre-op evaluation,  At surgeon's request and post-op pain management  Laterality: Right  Prep: Maximum Sterile Barrier Precautions used, chloraprep       Needles:  Injection technique: Single-shot  Needle Type: Echogenic Stimulator Needle     Needle Length: 9cm  Needle Gauge: 22     Additional Needles:   Procedures:,,,, ultrasound used (permanent image in chart),,,,  Narrative:  Start time: 10/16/2019 12:10 PM End time: 10/16/2019 12:15 PM Injection made incrementally with aspirations every 5 mL.  Performed by: Personally  Anesthesiologist: Pervis Hocking, DO  Additional Notes: Monitors applied. No increased pain on injection. No increased resistance to injection. Injection made in 5cc increments. Good needle visualization. Patient tolerated procedure well.

## 2019-10-16 NOTE — Progress Notes (Signed)
Assisted Dr. Doroteo Glassman with right, ultrasound guided, supraclavicular block. Side rails up, monitors on throughout procedure. See vital signs in flow sheet. Tolerated Procedure well.

## 2019-10-16 NOTE — Discharge Instructions (Addendum)
No Tylenol until [place] Instructions Hand Surgery  Wound Care: Keep your hand elevated above the level of your heart.  Do not allow it to dangle by your side.  Keep the dressing dry and do not remove it unless your doctor advises you to do so.  He will usually change it at the time of your post-op visit.  Moving your fingers is advised to stimulate circulation but will depend on the site of your surgery.  If you have a splint applied, your doctor will advise you regarding movement.  Activity: Do not drive or operate machinery today.  Rest today and then you may return to your normal activity and work as indicated by your physician.  Diet:  Drink liquids today or eat a light diet.  You may resume a regular diet tomorrow.    General expectations: Pain for two to three days. Fingers may become slightly swollen.  Call your doctor if any of the following occur: Severe pain not relieved by pain medication. Elevated temperature. Dressing soaked with blood. Inability to move fingers. White or bluish color to fingers.    Post Anesthesia Home Care Instructions  Activity: Get plenty of rest for the remainder of the day. A responsible individual must stay with you for 24 hours following the procedure.  For the next 24 hours, DO NOT: -Drive a car -Paediatric nurse -Drink alcoholic beverages -Take any medication unless instructed by your physician -Make any legal decisions or sign important papers.  Meals: Start with liquid foods such as gelatin or soup. Progress to regular foods as tolerated. Avoid greasy, spicy, heavy foods. If nausea and/or vomiting occur, drink only clear liquids until the nausea and/or vomiting subsides. Call your physician if vomiting continues.  Special Instructions/Symptoms: Your throat may feel dry or sore from the anesthesia or the breathing tube placed in your throat during surgery. If this causes discomfort, gargle with warm salt water. The  discomfort should disappear within 24 hours.  If you had a scopolamine patch placed behind your ear for the management of post- operative nausea and/or vomiting:  1. The medication in the patch is effective for 72 hours, after which it should be removed.  Wrap patch in a tissue and discard in the trash. Wash hands thoroughly with soap and water. 2. You may remove the patch earlier than 72 hours if you experience unpleasant side effects which may include dry mouth, dizziness or visual disturbances. 3. Avoid touching the patch. Wash your hands with soap and water after contact with the patch.      Regional Anesthesia Blocks  1. Numbness or the inability to move the "blocked" extremity may last from 3-48 hours after placement. The length of time depends on the medication injected and your individual response to the medication. If the numbness is not going away after 48 hours, call your surgeon.  2. The extremity that is blocked will need to be protected until the numbness is gone and the  Strength has returned. Because you cannot feel it, you will need to take extra care to avoid injury. Because it may be weak, you may have difficulty moving it or using it. You may not know what position it is in without looking at it while the block is in effect.  3. For blocks in the legs and feet, returning to weight bearing and walking needs to be done carefully. You will need to wait until the numbness is entirely gone and the strength  has returned. You should be able to move your leg and foot normally before you try and bear weight or walk. You will need someone to be with you when you first try to ensure you do not fall and possibly risk injury.  4. Bruising and tenderness at the needle site are common side effects and will resolve in a few days.  5. Persistent numbness or new problems with movement should be communicated to the surgeon or the [place] [phone] [place] [phone]).

## 2019-10-17 ENCOUNTER — Other Ambulatory Visit: Payer: Self-pay | Admitting: Family Medicine

## 2019-10-17 ENCOUNTER — Encounter: Payer: Self-pay | Admitting: *Deleted

## 2019-10-17 NOTE — Telephone Encounter (Signed)
Last OV 04/28/19 Last fill 11/11/18  #120/5 Patient need a follow up visit before any other refills.

## 2019-10-17 NOTE — Anesthesia Postprocedure Evaluation (Signed)
Anesthesia Post Note  Patient: Corey Bell  Procedure(s) Performed: RIGHT THUMB ULNAR COLLATERAL LIGAMENT RECONSTRUCTION (Right Hand)     Patient location during evaluation: PACU Anesthesia Type: MAC and Regional Level of consciousness: awake and alert Pain management: pain level controlled Vital Signs Assessment: post-procedure vital signs reviewed and stable Respiratory status: spontaneous breathing, nonlabored ventilation and respiratory function stable Cardiovascular status: blood pressure returned to baseline and stable Postop Assessment: no apparent nausea or vomiting Anesthetic complications: no    Last Vitals:  Vitals:   10/16/19 1400 10/16/19 1414  BP: 132/82 (!) 141/77  Pulse: (!) 48 (!) 52  Resp: (!) 9 18  Temp:  36.4 C  SpO2: 99% 99%    Last Pain:  Vitals:   10/16/19 1414  TempSrc: Oral  PainSc: 0-No pain                 Pervis Hocking

## 2019-10-20 ENCOUNTER — Other Ambulatory Visit: Payer: Self-pay | Admitting: Family Medicine

## 2019-10-20 MED ORDER — TRAMADOL HCL 50 MG PO TABS: ORAL_TABLET | ORAL | 5 refills | Status: DC

## 2019-10-20 MED ORDER — METHOCARBAMOL 500 MG PO TABS: ORAL_TABLET | ORAL | 5 refills | Status: DC

## 2019-10-28 ENCOUNTER — Encounter: Payer: Self-pay | Admitting: Family Medicine

## 2019-11-03 ENCOUNTER — Other Ambulatory Visit: Payer: Self-pay | Admitting: Family Medicine

## 2019-11-03 DIAGNOSIS — S63641A Sprain of metacarpophalangeal joint of right thumb, initial encounter: Secondary | ICD-10-CM | POA: Diagnosis not present

## 2019-11-03 DIAGNOSIS — G4733 Obstructive sleep apnea (adult) (pediatric): Secondary | ICD-10-CM | POA: Diagnosis not present

## 2019-11-03 DIAGNOSIS — I10 Essential (primary) hypertension: Secondary | ICD-10-CM | POA: Diagnosis not present

## 2019-11-03 DIAGNOSIS — E039 Hypothyroidism, unspecified: Secondary | ICD-10-CM | POA: Diagnosis not present

## 2019-11-03 NOTE — Telephone Encounter (Signed)
Last OV 04/28/19 Last fill 05/09/19 #30/5

## 2019-11-04 ENCOUNTER — Other Ambulatory Visit: Payer: Self-pay

## 2019-11-04 DIAGNOSIS — R3911 Hesitancy of micturition: Secondary | ICD-10-CM

## 2019-11-04 MED ORDER — TAMSULOSIN HCL 0.4 MG PO CAPS: 0.4000 mg | ORAL_CAPSULE | Freq: Two times a day (BID) | ORAL | 3 refills | Status: DC

## 2019-11-04 MED ORDER — BUPROPION HCL ER (XL) 300 MG PO TB24: 300.0000 mg | ORAL_TABLET | Freq: Every day | ORAL | 3 refills | Status: DC

## 2019-11-04 MED ORDER — OXYCODONE-ACETAMINOPHEN 5-325 MG PO TABS: 1.0000 | ORAL_TABLET | Freq: Four times a day (QID) | ORAL | 0 refills | Status: DC | PRN

## 2019-11-04 MED ORDER — PANTOPRAZOLE SODIUM 40 MG PO TBEC: 40.0000 mg | DELAYED_RELEASE_TABLET | Freq: Every day | ORAL | 3 refills | Status: DC

## 2019-11-04 MED ORDER — LEVOTHYROXINE SODIUM 75 MCG PO TABS: 75.0000 ug | ORAL_TABLET | Freq: Every day | ORAL | 3 refills | Status: DC

## 2019-11-04 MED ORDER — CITALOPRAM HYDROBROMIDE 40 MG PO TABS: 40.0000 mg | ORAL_TABLET | Freq: Every day | ORAL | 3 refills | Status: DC

## 2019-11-04 MED ORDER — MONTELUKAST SODIUM 10 MG PO TABS: 10.0000 mg | ORAL_TABLET | Freq: Every day | ORAL | 3 refills | Status: DC

## 2019-11-07 ENCOUNTER — Other Ambulatory Visit: Payer: Self-pay | Admitting: Family Medicine

## 2019-11-07 ENCOUNTER — Encounter: Payer: Self-pay | Admitting: Family Medicine

## 2019-11-07 DIAGNOSIS — L600 Ingrowing nail: Secondary | ICD-10-CM

## 2019-11-13 ENCOUNTER — Encounter: Payer: Self-pay | Admitting: Podiatry

## 2019-11-13 ENCOUNTER — Ambulatory Visit: Payer: PPO | Admitting: Podiatry

## 2019-11-13 ENCOUNTER — Other Ambulatory Visit: Payer: Self-pay

## 2019-11-13 DIAGNOSIS — L600 Ingrowing nail: Secondary | ICD-10-CM

## 2019-11-14 ENCOUNTER — Encounter: Payer: Self-pay | Admitting: Family Medicine

## 2019-11-14 ENCOUNTER — Encounter: Payer: Self-pay | Admitting: Podiatry

## 2019-11-14 NOTE — Progress Notes (Signed)
Subjective:  Patient ID: Corey Bell, male    DOB: Apr 20, 1966,  MRN: [id number]  Chief Complaint  Patient presents with  . Nail Problem    Patient presents today for ingrown toenail right hallux x years off and on    54 y.o. male presents with the above complaint.  Patient presents with painful bilateral medial hallux ingrown nail borders.  Patient states that has been hurting him for a while.  He has not been able to ambulate with sneakers as is causing him too much pain with pressure.  He has not tried any treatment options at home except for soaking it.  He denies any other acute complaints.  He would like to know if there is anything that could be done for this.  He denies seeing anyone else for this.   Review of Systems: Negative except as noted in the HPI. Denies N/V/F/Ch.  Past Medical History:  Diagnosis Date  . Anxiety   . Aphthous ulcer 10-04-12   improving  . Arthritis    left index fnger  . Carpal tunnel syndrome, bilateral    damaged nerve in neck  . Chronic pain syndrome   . COPD (chronic obstructive pulmonary disease) (HCC)    mild  . Depression   . Diabetes mellitus without complication ([place])    type 2 , has not been medicated in over 1 year, no meds since Gastric sleeve  . Diverticulitis of colon   . Esophageal reflux   . H/O acute prostatitis   . Hypothyroidism   . Insomnia, unspecified   . Mixed hyperlipidemia   . Peyronie disease 12-20-12   PER uROLOGY  . Seasonal allergies   . Sleep apnea    uses cpap  . Tympanic membrane perforation    history of    Current Outpatient Medications:  .  buPROPion (WELLBUTRIN XL) 300 MG 24 hr tablet, Take 1 tablet (300 mg total) by mouth daily., Disp: 90 tablet, Rfl: 3 .  calcium carbonate (TUMS - DOSED IN MG ELEMENTAL CALCIUM) 500 MG chewable tablet, Chew 3 tablets by mouth daily., Disp: , Rfl:  .  citalopram (CELEXA) 40 MG tablet, Take 1 tablet (40 mg total) by mouth daily., Disp: 90 tablet, Rfl: 3 .   cyclobenzaprine (FLEXERIL) 10 MG tablet, Take 1 tablet (10 mg total) by mouth at bedtime., Disp: 30 tablet, Rfl: 5 .  diphenhydrAMINE (BENADRYL) 25 mg capsule, Take 50 mg by mouth at bedtime., Disp: , Rfl:  .  Ferrous Sulfate (IRON) 325 (65 Fe) MG TABS, Take by mouth daily. , Disp: , Rfl:  .  fexofenadine (ALLEGRA) 180 MG tablet, Take 180 mg by mouth daily., Disp: , Rfl:  .  gabapentin (NEURONTIN) 800 MG tablet, , Disp: , Rfl:  .  ibuprofen (ADVIL,MOTRIN) 600 MG tablet, Take 600 mg by mouth daily. , Disp: , Rfl:  .  levothyroxine (SYNTHROID) 75 MCG tablet, Take 1 tablet (75 mcg total) by mouth daily., Disp: 90 tablet, Rfl: 3 .  methocarbamol (ROBAXIN) 500 MG tablet, TAKE 2 TABLETS BY MOUTH EVERY 6 HOURS ASNEEDED FOR MUSCLE SPASMS, Disp: 120 tablet, Rfl: 5 .  montelukast (SINGULAIR) 10 MG tablet, Take 1 tablet (10 mg total) by mouth at bedtime., Disp: 90 tablet, Rfl: 3 .  oxyCODONE-acetaminophen (PERCOCET/ROXICET) 5-325 MG tablet, Take 1 tablet by mouth every 6 (six) hours as needed for severe pain., Disp: 90 tablet, Rfl: 0 .  pantoprazole (PROTONIX) 40 MG tablet, Take 1 tablet (40 mg total) by mouth daily.,  Disp: 90 tablet, Rfl: 3 .  Polyvinyl Alcohol (LIQUID TEARS OP), Place 1 drop into both eyes as needed (for dry eyes)., Disp: , Rfl:  .  Saw Palmetto 450 MG CAPS, Take 4 capsules by mouth daily. , Disp: , Rfl:  .  tamsulosin (FLOMAX) 0.4 MG CAPS capsule, Take 1 capsule (0.4 mg total) by mouth 2 (two) times daily., Disp: 180 capsule, Rfl: 3 .  Thiamine HCl (VITAMIN B-1) 250 MG tablet, Take 1 tablet by mouth daily., Disp: , Rfl:  .  traMADol (ULTRAM) 50 MG tablet, TAKE 1 TABLET BY MOUTH EVERY 8 HOURS AS NEEDED FOR MODERATE TO SEVEREPAIN, Disp: 30 tablet, Rfl: 5 .  Trolamine Salicylate (ASPERCREME EX), Apply 1 application topically as needed (for pain)., Disp: , Rfl:  .  vitamin B-12 (CYANOCOBALAMIN) 1000 MCG tablet, Take 1 tablet by mouth daily., Disp: , Rfl:   Social History   Tobacco Use    Smoking Status Former Smoker  . Packs/day: 1.50  . Years: 30.00  . Pack years: 45.00  . Types: Cigarettes  . Quit date: 10/09/2006  . Years since quitting: 13.1  Smokeless Tobacco Never Used    Allergies  Allergen Reactions  . Augmentin [Amoxicillin-Pot Clavulanate] Diarrhea  . Demerol [Meperidine] Other (See Comments)    "goes crazy and has the strength of 10 men"  . Eggs Or Egg-Derived Products Diarrhea and Nausea And Vomiting  . Sulfamethoxazole Other (See Comments)    Blisters form in mouth  . Statins Rash  . Sulfa Antibiotics Rash  . Sulfacetamide Sodium Rash   Objective:  There were no vitals filed for this visit. There is no height or weight on file to calculate BMI. Constitutional Well developed. Well nourished.  Vascular Dorsalis pedis pulses palpable bilaterally. Posterior tibial pulses palpable bilaterally. Capillary refill normal to all digits.  No cyanosis or clubbing noted. Pedal hair growth normal.  Neurologic Normal speech. Oriented to person, place, and time. Epicritic sensation to light touch grossly present bilaterally.  Dermatologic Painful ingrowing nail at medial nail borders of the hallux nail bilaterally. No other open wounds. No skin lesions.  Orthopedic: Normal joint ROM without pain or crepitus bilaterally. No visible deformities. No bony tenderness.   Radiographs: None Assessment:  No diagnosis found. Plan:  Patient was evaluated and treated and all questions answered.  Ingrown Nail, bilaterally -Patient elects to proceed with minor surgery to remove ingrown toenail removal today. Consent reviewed and signed by patient. -Ingrown nail excised. See procedure note. -Educated on post-procedure care including soaking. Written instructions provided and reviewed. -Patient to follow up in 2 weeks for nail check.  Procedure: Excision of Ingrown Toenail Location: Bilateral 1st toe medial nail borders. Anesthesia: Lidocaine 1% plain; 1.5 mL and  Marcaine 0.5% plain; 1.5 mL, digital block. Skin Prep: Betadine. Dressing: Silvadene; telfa; dry, sterile, compression dressing. Technique: Following skin prep, the toe was exsanguinated and a tourniquet was secured at the base of the toe. The affected nail border was freed, split with a nail splitter, and excised. Chemical matrixectomy was then performed with phenol and irrigated out with alcohol. The tourniquet was then removed and sterile dressing applied. Disposition: Patient tolerated procedure well. Patient to return in 2 weeks for follow-up.   No follow-ups on file.

## 2019-11-27 ENCOUNTER — Ambulatory Visit: Payer: PPO | Admitting: Podiatry

## 2019-12-05 DIAGNOSIS — M25641 Stiffness of right hand, not elsewhere classified: Secondary | ICD-10-CM | POA: Diagnosis not present

## 2019-12-05 DIAGNOSIS — S6991XD Unspecified injury of right wrist, hand and finger(s), subsequent encounter: Secondary | ICD-10-CM | POA: Diagnosis not present

## 2019-12-05 DIAGNOSIS — M25541 Pain in joints of right hand: Secondary | ICD-10-CM | POA: Diagnosis not present

## 2019-12-16 ENCOUNTER — Encounter: Payer: Self-pay | Admitting: Family Medicine

## 2019-12-16 ENCOUNTER — Other Ambulatory Visit: Payer: Self-pay

## 2019-12-16 ENCOUNTER — Ambulatory Visit (INDEPENDENT_AMBULATORY_CARE_PROVIDER_SITE_OTHER): Payer: PPO | Admitting: Family Medicine

## 2019-12-16 VITALS — BP 114/62 | HR 63 | Temp 97.9°F | Ht 74.0 in | Wt 254.2 lb

## 2019-12-16 DIAGNOSIS — E038 Other specified hypothyroidism: Secondary | ICD-10-CM

## 2019-12-16 DIAGNOSIS — Z125 Encounter for screening for malignant neoplasm of prostate: Secondary | ICD-10-CM

## 2019-12-16 DIAGNOSIS — I10 Essential (primary) hypertension: Secondary | ICD-10-CM

## 2019-12-16 DIAGNOSIS — E782 Mixed hyperlipidemia: Secondary | ICD-10-CM

## 2019-12-16 DIAGNOSIS — E119 Type 2 diabetes mellitus without complications: Secondary | ICD-10-CM | POA: Diagnosis not present

## 2019-12-16 DIAGNOSIS — G894 Chronic pain syndrome: Secondary | ICD-10-CM | POA: Diagnosis not present

## 2019-12-16 DIAGNOSIS — J449 Chronic obstructive pulmonary disease, unspecified: Secondary | ICD-10-CM | POA: Diagnosis not present

## 2019-12-16 LAB — LIPID PANEL
Cholesterol: 205 mg/dL — ABNORMAL HIGH (ref 0–200)
HDL: 35.8 mg/dL — ABNORMAL LOW (ref >39.00)
NonHDL: 168.95
Total CHOL/HDL Ratio: 6
Triglycerides: 334 mg/dL — ABNORMAL HIGH (ref 0.0–149.0)
VLDL: 66.8 mg/dL — ABNORMAL HIGH (ref 0.0–40.0)

## 2019-12-16 LAB — PSA: PSA: 0.23 ng/mL (ref 0.10–4.00)

## 2019-12-16 LAB — LDL CHOLESTEROL, DIRECT: Direct LDL: 135 mg/dL

## 2019-12-16 LAB — COMPREHENSIVE METABOLIC PANEL
ALT: 25 U/L (ref 0–53)
AST: 21 U/L (ref 0–37)
Albumin: 4.4 g/dL (ref 3.5–5.2)
Alkaline Phosphatase: 69 U/L (ref 39–117)
BUN: 21 mg/dL (ref 6–23)
CO2: 31 mEq/L (ref 19–32)
Calcium: 9.8 mg/dL (ref 8.4–10.5)
Chloride: 102 mEq/L (ref 96–112)
Creatinine, Ser: 1.24 mg/dL (ref 0.40–1.50)
GFR: 60.94 mL/min (ref >60.00)
Glucose, Bld: 99 mg/dL (ref 70–99)
Potassium: 5.1 mEq/L (ref 3.5–5.1)
Sodium: 138 mEq/L (ref 135–145)
Total Bilirubin: 1.3 mg/dL — ABNORMAL HIGH (ref 0.2–1.2)
Total Protein: 7.2 g/dL (ref 6.0–8.3)

## 2019-12-16 LAB — TSH: TSH: 2.26 u[IU]/mL (ref 0.35–4.50)

## 2019-12-16 LAB — HEMOGLOBIN A1C: Hgb A1c MFr Bld: 5.7 % (ref 4.6–6.5)

## 2019-12-16 NOTE — Assessment & Plan Note (Signed)
Pt notes some SOB, but not interested in trial of inhaler at this time. Cont to monitor. Continue to avoid tobacco.

## 2019-12-16 NOTE — Assessment & Plan Note (Signed)
Pt with tramadol and oxycodone. Discussed that I would not provide any further oxycodone prescriptions. Could continue prn tramadol and advised return when he is needing refill. Rare use overall.

## 2019-12-16 NOTE — Assessment & Plan Note (Signed)
BP well controled. Continue to monitor.

## 2019-12-16 NOTE — Assessment & Plan Note (Signed)
Currently diet controlled. Will check hgb a1c today.

## 2019-12-16 NOTE — Assessment & Plan Note (Signed)
Doing well. Repeat labs. Continue levo

## 2019-12-16 NOTE — Assessment & Plan Note (Signed)
Does not need statin. Sees cardiology due to family hx. Repeat lipids

## 2019-12-16 NOTE — Progress Notes (Signed)
Subjective:     Corey Bell is a 54 y.o. male presenting for Transfer of Care (from Corey Bell), Discuss diagnoses of chronic asthma (patient was not aware of this.), and Discuss COVID19 vaccine     HPI  #chronic back pain - got narcotics - oxycodone after surgery - taking prn - extreme cases - typically ends up flushing - for pain flare ups - will use ibuprofen, then tramadol, then oxycodone  #COPD - has noticed worsening breathing in the morning and evening - not interested in an inhaler   #Covid vaccine - does not take the flu vaccine because he has gotten symptoms every time  Review of Systems   Social History   Tobacco Use  Smoking Status Former Smoker  . Packs/day: 1.50  . Years: 20.00  . Pack years: 30.00  . Types: Cigarettes  . Quit date: 10/09/2006  . Years since quitting: 13.1  Smokeless Tobacco Never Used        Objective:    BP Readings from Last 3 Encounters:  12/16/19 114/62  10/16/19 (!) 141/77  10/13/19 (!) 144/70   Wt Readings from Last 3 Encounters:  12/16/19 254 lb 4 oz (115.3 kg)  10/16/19 249 lb 1.9 oz (113 kg)  10/13/19 246 lb (111.6 kg)    BP 114/62   Pulse 63   Temp 97.9 F (36.6 C)   Ht 6\' 2"  (1.88 m)   Wt 254 lb 4 oz (115.3 kg)   SpO2 96%   BMI 32.64 kg/m    Physical Exam Constitutional:      Appearance: Normal appearance. He is not ill-appearing or diaphoretic.  HENT:     Right Ear: External ear normal.     Left Ear: External ear normal.     Nose: Nose normal.  Eyes:     General: No scleral icterus.    Extraocular Movements: Extraocular movements intact.     Conjunctiva/sclera: Conjunctivae normal.  Cardiovascular:     Rate and Rhythm: Normal rate.  Pulmonary:     Effort: Pulmonary effort is normal.  Musculoskeletal:     Cervical back: Neck supple.  Skin:    General: Skin is warm and dry.  Neurological:     Mental Status: He is alert. Mental status is at baseline.  Psychiatric:        Mood and Affect:  Mood normal.      The 10-year ASCVD risk score Corey Bell., et al., 2013) is: 7.3%   Values used to calculate the score:     Age: 80 years     Sex: Male     Is Non-Hispanic African American: No     Diabetic: Yes     Tobacco smoker: No     Systolic Blood Pressure: 99991111 mmHg     Is BP treated: No     HDL Cholesterol: 35.8 mg/dL     Total Cholesterol: 156 mg/dL      Assessment & Plan:   Problem List Items Addressed This Visit      Cardiovascular and Mediastinum   Essential hypertension - Primary    BP well controled. Continue to monitor.       Relevant Orders   Comprehensive metabolic panel   Lipid panel     Respiratory   COPD (chronic obstructive pulmonary disease) ([place])    Pt notes some SOB, but not interested in trial of inhaler at this time. Cont to monitor. Continue to avoid tobacco.  Endocrine   Hypothyroidism    Doing well. Repeat labs. Continue levo      Relevant Orders   TSH   Diet-controlled diabetes mellitus ([place])    Currently diet controlled. Will check hgb a1c today.       Relevant Orders   Hemoglobin A1c     Other   Chronic pain syndrome    Pt with tramadol and oxycodone. Discussed that I would not provide any further oxycodone prescriptions. Could continue prn tramadol and advised return when he is needing refill. Rare use overall.       HLD (hyperlipidemia)    Does not need statin. Sees cardiology due to family hx. Repeat lipids      Relevant Orders   Lipid panel    Other Visit Diagnoses    Screening for prostate cancer       Relevant Orders   PSA       Return in about 6 months (around 06/17/2020).  Corey Noe, MD

## 2019-12-16 NOTE — Patient Instructions (Signed)
1) Regular exercise -- Try to get 150 minutes (30 minutes, 5 days a week) of moderate to vigorous aerobic excercise -- Examples: brisk walking (2.5 miles per hour), water aerobics, dancing, gardening, tennis, biking slower than 10 miles per hour

## 2019-12-17 ENCOUNTER — Encounter: Payer: Self-pay | Admitting: Family Medicine

## 2019-12-23 DIAGNOSIS — L218 Other seborrheic dermatitis: Secondary | ICD-10-CM | POA: Diagnosis not present

## 2020-01-23 ENCOUNTER — Encounter: Payer: Self-pay | Admitting: Internal Medicine

## 2020-01-23 ENCOUNTER — Ambulatory Visit (INDEPENDENT_AMBULATORY_CARE_PROVIDER_SITE_OTHER): Payer: PPO | Admitting: Internal Medicine

## 2020-01-23 ENCOUNTER — Other Ambulatory Visit: Payer: Self-pay

## 2020-01-23 VITALS — BP 138/78 | HR 66 | Temp 97.7°F | Wt 255.0 lb

## 2020-01-23 DIAGNOSIS — M542 Cervicalgia: Secondary | ICD-10-CM

## 2020-01-23 DIAGNOSIS — M549 Dorsalgia, unspecified: Secondary | ICD-10-CM | POA: Diagnosis not present

## 2020-01-23 MED ORDER — CYCLOBENZAPRINE HCL 10 MG PO TABS: 10.0000 mg | ORAL_TABLET | Freq: Every day | ORAL | 5 refills | Status: DC

## 2020-01-23 MED ORDER — CELECOXIB 200 MG PO CAPS: 200.0000 mg | ORAL_CAPSULE | Freq: Every day | ORAL | 3 refills | Status: DC

## 2020-01-23 NOTE — Progress Notes (Signed)
Subjective:    Patient ID: Corey Bell, male    DOB: 1966/01/05, 54 y.o.   MRN: [id number]  HPI  Pt presents to the clinic today with c/o pain below his right shoulder blade. This started 3 days ago. He describes the pain as throbbing, aching. The pain radiates into the right side of his neck.  He has known arthritis in his back. He denies dizziness, visual changes, numbness, tingling in the right arm. He is currently taking Flexeril, Gabapentin, Tramadol, Ibuprofen, Aspercream, Lidocaine cream and used heat with minimal relief. He would like his Flexeril refilled today.  Review of Systems      Past Medical History:  Diagnosis Date  . Anxiety   . Aphthous ulcer 10-04-12   improving  . Arthritis    left index fnger  . Carpal tunnel syndrome, bilateral    damaged nerve in neck  . Chronic pain syndrome   . COPD (chronic obstructive pulmonary disease) (HCC)    mild  . Depression   . Diabetes mellitus without complication ([facility])    type 2 , has not been medicated in over 1 year, no meds since Gastric sleeve  . Diverticulitis of colon   . Esophageal reflux   . H/O acute prostatitis   . Hypothyroidism   . Insomnia, unspecified   . Mixed hyperlipidemia   . Peyronie disease 12-20-12   PER uROLOGY  . Seasonal allergies   . Sleep apnea    uses cpap  . Tympanic membrane perforation    history of    Current Outpatient Medications  Medication Sig Dispense Refill  . buPROPion (WELLBUTRIN XL) 300 MG 24 hr tablet Take 1 tablet (300 mg total) by mouth daily. 90 tablet 3  . calcium carbonate (TUMS - DOSED IN MG ELEMENTAL CALCIUM) 500 MG chewable tablet Chew 3 tablets by mouth daily.    . citalopram (CELEXA) 40 MG tablet Take 1 tablet (40 mg total) by mouth daily. 90 tablet 3  . cyclobenzaprine (FLEXERIL) 10 MG tablet Take 1 tablet (10 mg total) by mouth at bedtime. 30 tablet 5  . diphenhydrAMINE (BENADRYL) 25 mg capsule Take 50 mg by mouth at bedtime.    . Ferrous Sulfate (IRON) 325  (65 Fe) MG TABS Take by mouth daily.     . fexofenadine (ALLEGRA) 180 MG tablet Take 180 mg by mouth daily.    Marland Kitchen gabapentin (NEURONTIN) 800 MG tablet     . ibuprofen (ADVIL,MOTRIN) 600 MG tablet Take 600 mg by mouth daily.     Marland Kitchen levothyroxine (SYNTHROID) 75 MCG tablet Take 1 tablet (75 mcg total) by mouth daily. 90 tablet 3  . methocarbamol (ROBAXIN) 500 MG tablet TAKE 2 TABLETS BY MOUTH EVERY 6 HOURS ASNEEDED FOR MUSCLE SPASMS 120 tablet 5  . montelukast (SINGULAIR) 10 MG tablet Take 1 tablet (10 mg total) by mouth at bedtime. 90 tablet 3  . oxyCODONE-acetaminophen (PERCOCET/ROXICET) 5-325 MG tablet Take 1 tablet by mouth every 6 (six) hours as needed for severe pain. 90 tablet 0  . pantoprazole (PROTONIX) 40 MG tablet Take 1 tablet (40 mg total) by mouth daily. 90 tablet 3  . Polyvinyl Alcohol (LIQUID TEARS OP) Place 1 drop into both eyes as needed (for dry eyes).    . Saw Palmetto 450 MG CAPS Take 4 capsules by mouth daily.     . tamsulosin (FLOMAX) 0.4 MG CAPS capsule Take 1 capsule (0.4 mg total) by mouth 2 (two) times daily. 180 capsule 3  .  Thiamine HCl (VITAMIN B-1) 250 MG tablet Take 1 tablet by mouth daily.    . traMADol (ULTRAM) 50 MG tablet TAKE 1 TABLET BY MOUTH EVERY 8 HOURS AS NEEDED FOR MODERATE TO SEVEREPAIN 30 tablet 5  . Trolamine Salicylate (ASPERCREME EX) Apply 1 application topically as needed (for pain).    . vitamin B-12 (CYANOCOBALAMIN) 1000 MCG tablet Take 1 tablet by mouth daily.     No current facility-administered medications for this visit.    Allergies  Allergen Reactions  . Augmentin [Amoxicillin-Pot Clavulanate] Diarrhea  . Demerol [Meperidine] Other (See Comments)    "goes crazy and has the strength of 10 men"  . Eggs Or Egg-Derived Products Diarrhea and Nausea And Vomiting  . Sulfamethoxazole Other (See Comments)    Blisters form in mouth  . Statins Rash  . Sulfa Antibiotics Rash  . Sulfacetamide Sodium Rash    Family History  Problem Relation Age  of Onset  . Congestive Heart Failure Father   . Heart disease Father   . COPD Father   . Kidney cancer Paternal Grandfather   . Heart disease Brother        stents at 23 yo  . Bladder Cancer Paternal Uncle   . Lupus Brother   . Heart failure Paternal Grandmother   . Parkinsonism Neg Hx     Social History   Socioeconomic History  . Marital status: Married    Spouse name: [name]  . Number of children: 0  . Years of education: college  . Highest education level: Not on file  Occupational History  . 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 Smoking status: Former Smoker    Packs/day: 1.50    Years: 20.00    Pack years: 30.00    Types: Cigarettes    Quit date: 10/09/2006    Years since quitting: 13.2  . Smokeless tobacco: Never Used  Substance and Sexual Activity  . Alcohol use: Yes    Alcohol/week: 1.0 standard drinks    Types: 1 Cans of beer per week    Comment: rarely  . Drug use: No  . Sexual activity: Yes    Birth control/protection: Surgical  Other Topics Concern  . 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 Difficulty of Paying Living Expenses:   Food Insecurity:   . Worried About Charity fundraiser in the Last Year:   . Arboriculturist in the Last Year:   Transportation Needs:   . Film/video editor (Medical):   Marland Kitchen Lack of Transportation (Non-Medical):   Physical Activity:   . Days of Exercise per  Week:   . Minutes of Exercise per Session:   Stress:   . Feeling of Stress :  Social Connections:   . Frequency of Communication with Friends and Family:   . Frequency of Social Gatherings with Friends and Family:   . Attends Religious Services:   . Active Member of Clubs or Organizations:   . Attends Archivist Meetings:   Marland Kitchen Marital Status:   Intimate Partner Violence:   . Fear of Current or Ex-Partner:   . Emotionally Abused:   Marland Kitchen Physically Abused:   . Sexually Abused:      Constitutional: Denies fever, malaise, fatigue, headache or abrupt weight changes.  Respiratory: Denies difficulty breathing, shortness of breath, cough or sputum production.   Cardiovascular: Denies chest pain, chest tightness, palpitations or swelling in the hands or feet.  Musculoskeletal: Pt reports right side neck pain, right upper back pain. Denies decrease in range of motion, difficulty with gait, or joint swelling.  Skin: Denies redness, rashes, lesions or ulcercations.  Neurological: Denies numbness, tingling or problems with balance and coordination.    No other specific complaints in a complete review of systems (except as listed in HPI above).  Objective:   Physical Exam BP 138/78   Pulse 66   Temp 97.7 F (36.5 C) (Temporal)   Wt 255 lb (115.7 kg)   SpO2 98%   BMI 32.74 kg/m   Wt Readings from Last 3 Encounters:  12/16/19 254 lb 4 oz (115.3 kg)  10/16/19 249 lb 1.9 oz (113 kg)  10/13/19 246 lb (111.6 kg)    General: Appears his stated age, obese, in NAD. Skin: Warm, dry and intact. No rashes noted. Cardiovascular: Normal rate and rhythm.  Pulmonary/Chest: Normal effort and positive vesicular breath sounds.  Musculoskeletal: Normal internal and external rotation of the right shoulder. Pain with palpation in the right subscapular area. Neurological: Alert and oriented.    BMET    Component Value Date/Time   NA 138 12/16/2019 1149   NA 142 01/27/2018 0000   NA 138  12/26/2012 1936   K 5.1 12/16/2019 1149   K 3.9 12/26/2012 1936   CL 102 12/16/2019 1149   CL 106 12/26/2012 1936   CO2 31 12/16/2019 1149   CO2 24 12/26/2012 1936   GLUCOSE 99 12/16/2019 1149   GLUCOSE 124 (H) 12/26/2012 1936   BUN 21 12/16/2019 1149   BUN 19 01/27/2018 0000   BUN 22 (H) 12/26/2012 1936   CREATININE 1.24 12/16/2019 1149   CREATININE 1.24 09/23/2015 1440   CALCIUM 9.8 12/16/2019 1149   CALCIUM 8.4 (L) 12/26/2012 1936   GFRNONAA >60 02/09/2015 1256   GFRNONAA 58 (L) 12/26/2012 1936   GFRAA >60 02/09/2015 1256   GFRAA >60 12/26/2012 1936    Lipid Panel     Component Value Date/Time   CHOL 205 (H) 12/16/2019 1149   TRIG 334.0 (H) 12/16/2019 1149   HDL 35.80 (L) 12/16/2019 1149   CHOLHDL 6 12/16/2019 1149   VLDL 66.8 (H) 12/16/2019 1149   LDLCALC 82 10/17/2018 0920    CBC    Component Value Date/Time   WBC 5.3 10/17/2018 0920   RBC 4.89 10/17/2018 0920   HGB 15.5 10/17/2018 0920   HGB 15.2 12/26/2012 1936   HCT 44.9 10/17/2018 0920   HCT 44.6 12/26/2012 1936   PLT 194.0 10/17/2018 0920   PLT 218 12/26/2012 1936   MCV 91.9 10/17/2018 0920   MCV 94 12/26/2012 1936   MCH 32.0 08/01/2017 0523   MCHC 34.5 10/17/2018 0920   RDW 12.8 10/17/2018 0920   RDW 13.3 12/26/2012 1936  LYMPHSABS 1.6 10/17/2018 0920   MONOABS 0.4 10/17/2018 0920   EOSABS 0.2 10/17/2018 0920   BASOSABS 0.1 10/17/2018 0920    Hgb A1C Lab Results  Component Value Date   HGBA1C 5.7 12/16/2019            Assessment & Plan:   Right Side Neck Pain, Right Upper Back Pain:  Seems muscular Stop Ibuprofen, can take Tylenol OTC RX for Celebrex 100 mg BID Flexeril refilled today Encouraged heat and stretching  Return precautions discussed Webb Silversmith, NP This visit occurred during the SARS-CoV-2 public health emergency.  Safety protocols were in place, including screening questions prior to the visit, additional usage of staff PPE, and extensive cleaning of exam room  while observing appropriate contact time as indicated for disinfecting solutions.

## 2020-01-25 ENCOUNTER — Encounter: Payer: Self-pay | Admitting: Internal Medicine

## 2020-01-25 NOTE — Patient Instructions (Signed)
Shoulder Exercises Ask your health care provider which exercises are safe for you. Do exercises exactly as told by your health care provider and adjust them as directed. It is normal to feel mild stretching, pulling, tightness, or discomfort as you do these exercises. Stop right away if you feel sudden pain or your pain gets worse. Do not begin these exercises until told by your health care provider. Stretching exercises External rotation and abduction This exercise is sometimes called corner stretch. This exercise rotates your arm outward (external rotation) and moves your arm out from your body (abduction). 1. Stand in a doorway with one of your feet slightly in front of the other. This is called a staggered stance. If you cannot reach your forearms to the door frame, stand facing a corner of a room. 2. Choose one of the following positions as told by your health care provider: ? Place your hands and forearms on the door frame above your head. ? Place your hands and forearms on the door frame at the height of your head. ? Place your hands on the door frame at the height of your elbows. 3. Slowly move your weight onto your front foot until you feel a stretch across your chest and in the front of your shoulders. Keep your head and chest upright and keep your abdominal muscles tight. 4. Hold for __________ seconds. 5. To release the stretch, shift your weight to your back foot. Repeat __________ times. Complete this exercise __________ times a day. Extension, standing 1. Stand and hold a broomstick, a cane, or a similar object behind your back. ? Your hands should be a little wider than shoulder width apart. ? Your palms should face away from your back. 2. Keeping your elbows straight and your shoulder muscles relaxed, move the stick away from your body until you feel a stretch in your shoulders (extension). ? Avoid shrugging your shoulders while you move the stick. Keep your shoulder blades tucked  down toward the middle of your back. 3. Hold for __________ seconds. 4. Slowly return to the starting position. Repeat __________ times. Complete this exercise __________ times a day. Range-of-motion exercises Pendulum  1. Stand near a wall or a surface that you can hold onto for balance. 2. Bend at the waist and let your left / right arm hang straight down. Use your other arm to support you. Keep your back straight and do not lock your knees. 3. Relax your left / right arm and shoulder muscles, and move your hips and your trunk so your left / right arm swings freely. Your arm should swing because of the motion of your body, not because you are using your arm or shoulder muscles. 4. Keep moving your hips and trunk so your arm swings in the following directions, as told by your health care provider: ? Side to side. ? Forward and backward. ? In clockwise and counterclockwise circles. 5. Continue each motion for __________ seconds, or for as long as told by your health care provider. 6. Slowly return to the starting position. Repeat __________ times. Complete this exercise __________ times a day. Shoulder flexion, standing  1. Stand and hold a broomstick, a cane, or a similar object. Place your hands a little more than shoulder width apart on the object. Your left / right hand should be palm up, and your other hand should be palm down. 2. Keep your elbow straight and your shoulder muscles relaxed. Push the stick up with your healthy arm to   raise your left / right arm in front of your body, and then over your head until you feel a stretch in your shoulder (flexion). ? Avoid shrugging your shoulder while you raise your arm. Keep your shoulder blade tucked down toward the middle of your back. 3. Hold for __________ seconds. 4. Slowly return to the starting position. Repeat __________ times. Complete this exercise __________ times a day. Shoulder abduction, standing 1. Stand and hold a broomstick,  a cane, or a similar object. Place your hands a little more than shoulder width apart on the object. Your left / right hand should be palm up, and your other hand should be palm down. 2. Keep your elbow straight and your shoulder muscles relaxed. Push the object across your body toward your left / right side. Raise your left / right arm to the side of your body (abduction) until you feel a stretch in your shoulder. ? Do not raise your arm above shoulder height unless your health care provider tells you to do that. ? If directed, raise your arm over your head. ? Avoid shrugging your shoulder while you raise your arm. Keep your shoulder blade tucked down toward the middle of your back. 3. Hold for __________ seconds. 4. Slowly return to the starting position. Repeat __________ times. Complete this exercise __________ times a day. Internal rotation  1. Place your left / right hand behind your back, palm up. 2. Use your other hand to dangle an exercise band, a towel, or a similar object over your shoulder. Grasp the band with your left / right hand so you are holding on to both ends. 3. Gently pull up on the band until you feel a stretch in the front of your left / right shoulder. The movement of your arm toward the center of your body is called internal rotation. ? Avoid shrugging your shoulder while you raise your arm. Keep your shoulder blade tucked down toward the middle of your back. 4. Hold for __________ seconds. 5. Release the stretch by letting go of the band and lowering your hands. Repeat __________ times. Complete this exercise __________ times a day. Strengthening exercises External rotation  1. Sit in a stable chair without armrests. 2. Secure an exercise band to a stable object at elbow height on your left / right side. 3. Place a soft object, such as a folded towel or a small pillow, between your left / right upper arm and your body to move your elbow about 4 inches (10 cm) away  from your side. 4. Hold the end of the exercise band so it is tight and there is no slack. 5. Keeping your elbow pressed against the soft object, slowly move your forearm out, away from your abdomen (external rotation). Keep your body steady so only your forearm moves. 6. Hold for __________ seconds. 7. Slowly return to the starting position. Repeat __________ times. Complete this exercise __________ times a day. Shoulder abduction  1. Sit in a stable chair without armrests, or stand up. 2. Hold a __________ weight in your left / right hand, or hold an exercise band with both hands. 3. Start with your arms straight down and your left / right palm facing in, toward your body. 4. Slowly lift your left / right hand out to your side (abduction). Do not lift your hand above shoulder height unless your health care provider tells you that this is safe. ? Keep your arms straight. ? Avoid shrugging your shoulder while you   do this movement. Keep your shoulder blade tucked down toward the middle of your back. 5. Hold for __________ seconds. 6. Slowly lower your arm, and return to the starting position. Repeat __________ times. Complete this exercise __________ times a day. Shoulder extension 1. Sit in a stable chair without armrests, or stand up. 2. Secure an exercise band to a stable object in front of you so it is at shoulder height. 3. Hold one end of the exercise band in each hand. Your palms should face each other. 4. Straighten your elbows and lift your hands up to shoulder height. 5. Step back, away from the secured end of the exercise band, until the band is tight and there is no slack. 6. Squeeze your shoulder blades together as you pull your hands down to the sides of your thighs (extension). Stop when your hands are straight down by your sides. Do not let your hands go behind your body. 7. Hold for __________ seconds. 8. Slowly return to the starting position. Repeat __________ times.  Complete this exercise __________ times a day. Shoulder row 1. Sit in a stable chair without armrests, or stand up. 2. Secure an exercise band to a stable object in front of you so it is at waist height. 3. Hold one end of the exercise band in each hand. Position your palms so that your thumbs are facing the ceiling (neutral position). 4. Bend each of your elbows to a 90-degree angle (right angle) and keep your upper arms at your sides. 5. Step back until the band is tight and there is no slack. 6. Slowly pull your elbows back behind you. 7. Hold for __________ seconds. 8. Slowly return to the starting position. Repeat __________ times. Complete this exercise __________ times a day. Shoulder press-ups  1. Sit in a stable chair that has armrests. Sit upright, with your feet flat on the floor. 2. Put your hands on the armrests so your elbows are bent and your fingers are pointing forward. Your hands should be about even with the sides of your body. 3. Push down on the armrests and use your arms to lift yourself off the chair. Straighten your elbows and lift yourself up as much as you comfortably can. ? Move your shoulder blades down, and avoid letting your shoulders move up toward your ears. ? Keep your feet on the ground. As you get stronger, your feet should support less of your body weight as you lift yourself up. 4. Hold for __________ seconds. 5. Slowly lower yourself back into the chair. Repeat __________ times. Complete this exercise __________ times a day. Wall push-ups  1. Stand so you are facing a stable wall. Your feet should be about one arm-length away from the wall. 2. Lean forward and place your palms on the wall at shoulder height. 3. Keep your feet flat on the floor as you bend your elbows and lean forward toward the wall. 4. Hold for __________ seconds. 5. Straighten your elbows to push yourself back to the starting position. Repeat __________ times. Complete this exercise  __________ times a day. This information is not intended to replace advice given to you by your health care provider. Make sure you discuss any questions you have with your health care provider. Document Revised: 01/17/2019 Document Reviewed: 10/25/2018 Elsevier Patient Education  2020 [employer]  

## 2020-01-27 ENCOUNTER — Other Ambulatory Visit: Payer: Self-pay | Admitting: Internal Medicine

## 2020-01-28 NOTE — Telephone Encounter (Signed)
There has been no phone note. Will send this to Dr. Einar Pheasant

## 2020-02-18 ENCOUNTER — Encounter (HOSPITAL_COMMUNITY): Payer: Self-pay

## 2020-03-02 ENCOUNTER — Other Ambulatory Visit: Payer: Self-pay

## 2020-03-02 ENCOUNTER — Encounter: Payer: Self-pay | Admitting: Internal Medicine

## 2020-03-02 ENCOUNTER — Ambulatory Visit (INDEPENDENT_AMBULATORY_CARE_PROVIDER_SITE_OTHER): Payer: PPO | Admitting: Internal Medicine

## 2020-03-02 DIAGNOSIS — E119 Type 2 diabetes mellitus without complications: Secondary | ICD-10-CM

## 2020-03-02 DIAGNOSIS — F4311 Post-traumatic stress disorder, acute: Secondary | ICD-10-CM

## 2020-03-02 DIAGNOSIS — K219 Gastro-esophageal reflux disease without esophagitis: Secondary | ICD-10-CM

## 2020-03-02 DIAGNOSIS — M5137 Other intervertebral disc degeneration, lumbosacral region: Secondary | ICD-10-CM

## 2020-03-02 DIAGNOSIS — M5030 Other cervical disc degeneration, unspecified cervical region: Secondary | ICD-10-CM

## 2020-03-02 DIAGNOSIS — J449 Chronic obstructive pulmonary disease, unspecified: Secondary | ICD-10-CM | POA: Diagnosis not present

## 2020-03-02 DIAGNOSIS — E782 Mixed hyperlipidemia: Secondary | ICD-10-CM | POA: Diagnosis not present

## 2020-03-02 DIAGNOSIS — N400 Enlarged prostate without lower urinary tract symptoms: Secondary | ICD-10-CM

## 2020-03-02 DIAGNOSIS — G4733 Obstructive sleep apnea (adult) (pediatric): Secondary | ICD-10-CM | POA: Diagnosis not present

## 2020-03-02 DIAGNOSIS — E038 Other specified hypothyroidism: Secondary | ICD-10-CM | POA: Diagnosis not present

## 2020-03-02 DIAGNOSIS — D509 Iron deficiency anemia, unspecified: Secondary | ICD-10-CM | POA: Insufficient documentation

## 2020-03-02 DIAGNOSIS — F4323 Adjustment disorder with mixed anxiety and depressed mood: Secondary | ICD-10-CM | POA: Diagnosis not present

## 2020-03-02 DIAGNOSIS — I10 Essential (primary) hypertension: Secondary | ICD-10-CM | POA: Diagnosis not present

## 2020-03-02 DIAGNOSIS — D508 Other iron deficiency anemias: Secondary | ICD-10-CM

## 2020-03-02 MED ORDER — EZETIMIBE 10 MG PO TABS
10.0000 mg | ORAL_TABLET | Freq: Every day | ORAL | 3 refills | Status: DC
Start: 2020-03-02 — End: 2021-04-25

## 2020-03-02 NOTE — Assessment & Plan Note (Signed)
Will wean Gabapentin Continue Celebrex, Methocarbamol, Flexeril and Tramadol

## 2020-03-02 NOTE — Assessment & Plan Note (Signed)
Continue oral iron Will monitor CBC yearly

## 2020-03-02 NOTE — Assessment & Plan Note (Signed)
Continue Citalopram and Wellbutrin Support offered today

## 2020-03-02 NOTE — Assessment & Plan Note (Signed)
Continue Levothyroxine Will monitor thyroid levels

## 2020-03-02 NOTE — Assessment & Plan Note (Signed)
RX for Zetia 10 mg daily Encouraged low fat diet, increase in aerobic exercise Will repeat lipid in 3 months, lab only.

## 2020-03-02 NOTE — Assessment & Plan Note (Signed)
Controlled off meds  Will monitor 

## 2020-03-02 NOTE — Patient Instructions (Signed)

## 2020-03-02 NOTE — Progress Notes (Signed)
HPI  Pt presents to the clinic today to establish care and for management of the conditions listed below.  HTN: His BP today is 124/78. He is not taking any antihypertensive medication at this time. ECG from 10/2017.  HLD: His last LDL was 135, triglycerides 334, 12/2019. He is not taking any cholesterol lowering medication at this time. He would like a RX for Zetia 10 mg daily. He tries to consume a low fat diet.  DM2: His last A1C was 5.7, 12/2019. He is not taking any oral diabetic medication at this time. He does not monitor his sugars. He does not check his feet routinely. He does not get a routine eye exam.  GERD: He has had a gastric sleeve in the past. He denies breakthrough on Pantoprazole. There is no upper GI on file.   Anxiety, Depression/PTSD: Managed on Citalopram and Wellbutrin. He is not currently seeing a therapist. He denies SI/HI.  Hypothyroidism: He denies any issues on his current dose of Levothyroxine. He is not following with endocrinology.  Chronic Back Pain: Managed on Celebrex, Gabapentin, Methocarbamol/Flexeril and Tramadol as needed. He feels like the Gabapentin is causing brain fog. He would like to wean back on this if he could.   COPD: He has intermittent SOB but no chronic cough. Currently not on any inhalers. He is not currently smoking. There are no PFT's on file.  BPH: He voids fine on Flomax. He does not follow with urology.  OSA: He averages 8 hours of sleep per night with the use of his CPAP. Sleep and study from 11/2012 reviewed.  Iron Deficiency Anemia: His last H/H was 15.9/44.9, 10/2018. He is taking an iron supplement OTC.  Flu: never Tetanus: 12/2017 Covid: Moderna Pneumovax: never Shingrix: 03/2018, 05/2018 PSA Screening: 12/2019 Colon Screening: 01/2017 Vision Screening: as needed Dentist: annually  Past Medical History:  Diagnosis Date  . Anxiety   . Aphthous ulcer 10-04-12   improving  . Arthritis    left index fnger  . Carpal tunnel  syndrome, bilateral    damaged nerve in neck  . Chronic pain syndrome   . COPD (chronic obstructive pulmonary disease) (HCC)    mild  . Depression   . Diabetes mellitus without complication ([place])    type 2 , has not been medicated in over 1 year, no meds since Gastric sleeve  . Diverticulitis of colon   . Esophageal reflux   . H/O acute prostatitis   . Hypothyroidism   . Insomnia, unspecified   . Mixed hyperlipidemia   . Peyronie disease 12-20-12   PER uROLOGY  . Seasonal allergies   . Sleep apnea    uses cpap  . Tympanic membrane perforation    history of    Current Outpatient Medications  Medication Sig Dispense Refill  . buPROPion (WELLBUTRIN XL) 300 MG 24 hr tablet Take 1 tablet (300 mg total) by mouth daily. 90 tablet 3  . calcium carbonate (TUMS - DOSED IN MG ELEMENTAL CALCIUM) 500 MG chewable tablet Chew 3 tablets by mouth daily.    . celecoxib (CELEBREX) 200 MG capsule Take 1 capsule (200 mg total) by mouth daily. 90 capsule 3  . citalopram (CELEXA) 40 MG tablet Take 1 tablet (40 mg total) by mouth daily. 90 tablet 3  . cyclobenzaprine (FLEXERIL) 10 MG tablet Take 1 tablet (10 mg total) by mouth at bedtime. 30 tablet 5  . diphenhydrAMINE (BENADRYL) 25 mg capsule Take 50 mg by mouth at bedtime.    Marland Kitchen  Ferrous Sulfate (IRON) 325 (65 Fe) MG TABS Take by mouth daily.     . fexofenadine (ALLEGRA) 180 MG tablet Take 180 mg by mouth daily.    Marland Kitchen gabapentin (NEURONTIN) 800 MG tablet TAKE 1 TABLET BY MOUTH 4 TIMES A DAY 360 tablet 0  . ibuprofen (ADVIL,MOTRIN) 600 MG tablet Take 600 mg by mouth daily.     Marland Kitchen levothyroxine (SYNTHROID) 75 MCG tablet Take 1 tablet (75 mcg total) by mouth daily. 90 tablet 3  . methocarbamol (ROBAXIN) 500 MG tablet TAKE 2 TABLETS BY MOUTH EVERY 6 HOURS ASNEEDED FOR MUSCLE SPASMS 120 tablet 5  . montelukast (SINGULAIR) 10 MG tablet Take 1 tablet (10 mg total) by mouth at bedtime. 90 tablet 3  . pantoprazole (PROTONIX) 40 MG tablet Take 1 tablet (40 mg  total) by mouth daily. 90 tablet 3  . Polyvinyl Alcohol (LIQUID TEARS OP) Place 1 drop into both eyes as needed (for dry eyes).    . Saw Palmetto 450 MG CAPS Take 4 capsules by mouth daily.     . tamsulosin (FLOMAX) 0.4 MG CAPS capsule Take 1 capsule (0.4 mg total) by mouth 2 (two) times daily. 180 capsule 3  . Thiamine HCl (VITAMIN B-1) 250 MG tablet Take 1 tablet by mouth daily.    . traMADol (ULTRAM) 50 MG tablet TAKE 1 TABLET BY MOUTH EVERY 8 HOURS AS NEEDED FOR MODERATE TO SEVEREPAIN 30 tablet 5  . Trolamine Salicylate (ASPERCREME EX) Apply 1 application topically as needed (for pain).    . vitamin B-12 (CYANOCOBALAMIN) 1000 MCG tablet Take 1 tablet by mouth daily.     No current facility-administered medications for this visit.    Allergies  Allergen Reactions  . Augmentin [Amoxicillin-Pot Clavulanate] Diarrhea  . Demerol [Meperidine] Other (See Comments)    "goes crazy and has the strength of 10 men"  . Eggs Or Egg-Derived Products Diarrhea and Nausea And Vomiting  . Sulfamethoxazole Other (See Comments)    Blisters form in mouth  . Statins Rash  . Sulfa Antibiotics Rash  . Sulfacetamide Sodium Rash    Family History  Problem Relation Age of Onset  . Congestive Heart Failure Father   . Heart disease Father   . COPD Father   . Kidney cancer Paternal Grandfather   . Heart disease Brother        stents at 27 yo  . Bladder Cancer Paternal Uncle   . Lupus Brother   . Heart failure Paternal Grandmother   . Parkinsonism Neg Hx     Social History   Socioeconomic History  . Marital status: Married    Spouse name: [name]  . Number of children: 0  . Years of education: college  . Highest education level: Not on file  Occupational History  . 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 Smoking status: Former Smoker    Packs/day: 1.50    Years: 20.00    Pack years: 30.00    Types: Cigarettes    Quit date: 10/09/2006    Years  since quitting: 13.4  . Smokeless tobacco: Never Used  Substance and Sexual Activity  . Alcohol use: Yes    Alcohol/week: 1.0 standard drinks    Types: 1 Cans of beer per week    Comment: rarely  . Drug use: No  . Sexual activity: Yes    Birth control/protection: Surgical  Other Topics Concern  . 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 Difficulty of Paying Living Expenses:   Food Insecurity:   . Worried About Charity fundraiser in the Last Year:   . Arboriculturist in the Last Year:   Transportation Needs:   . Film/video editor (Medical):   Marland Kitchen Lack of Transportation (Non-Medical):   Physical Activity:   . Days of Exercise per Week:   . Minutes of Exercise per Session:   Stress:   . Feeling of Stress :   Social Connections:   . Frequency of Communication with Friends and Family:   . Frequency of Social Gatherings with Friends and Family:   . Attends Religious Services:   . Active Member of Clubs or Organizations:   . Attends Archivist Meetings:   Marland Kitchen Marital Status:   Intimate Partner Violence:   . Fear of Current or Ex-Partner:   . Emotionally Abused:   Marland Kitchen Physically Abused:   . Sexually Abused:     ROS:  Constitutional: Denies fever, malaise, fatigue, headache or abrupt weight changes.  HEENT: Denies eye pain, eye redness, ear pain, ringing in the ears, wax buildup, runny nose, nasal congestion, bloody nose, or sore throat. Respiratory: Denies  difficulty breathing, shortness of breath, cough or sputum production.   Cardiovascular: Denies chest pain, chest tightness, palpitations or swelling in the hands or feet.  Gastrointestinal: Denies abdominal pain, bloating, constipation, diarrhea or blood in the stool.  GU: Denies frequency, urgency, pain with urination, blood in urine, odor or discharge. Musculoskeletal: Pt reports back pain. Denies decrease in range of motion, difficulty with gait, or joint swelling.  Skin: Denies redness, rashes, lesions or ulcercations.  Neurological: Denies dizziness, difficulty with memory, difficulty with speech or problems with balance and coordination.  Psych: Denies anxiety, depression, SI/HI.  No other specific complaints in a complete review of systems (except as listed in HPI above).  PE:  BP 124/78   Pulse 62   Temp 98.2 F (36.8 C) (Temporal)   Wt 259 lb (117.5 kg)   SpO2 98%   BMI 33.25 kg/m   Wt Readings from Last 3 Encounters:  01/23/20 255 lb (115.7 kg)  12/16/19 254 lb 4 oz (115.3 kg)  10/16/19 249 lb 1.9 oz (113 kg)    General: Appears her stated age, obese, in NAD. HEENT: Head: normal shape and size; Eyes: sclera white, no icterus, conjunctiva pink, PERRLA and EOMs intact;  Cardiovascular: Normal rate. Pulmonary/Chest: Normal effort. Musculoskeletal: No difficulty with gait.  Neurological: Alert and oriented.  Psychiatric: Mood and affect normal. Behavior is normal. Judgment and thought content normal.     BMET    Component Value Date/Time   NA 138 12/16/2019 1149   NA 142 01/27/2018 0000   NA 138 12/26/2012 1936   K 5.1 12/16/2019  1149   K 3.9 12/26/2012 1936   CL 102 12/16/2019 1149   CL 106 12/26/2012 1936   CO2 31 12/16/2019 1149   CO2 24 12/26/2012 1936   GLUCOSE 99 12/16/2019 1149   GLUCOSE 124 (H) 12/26/2012 1936   BUN 21 12/16/2019 1149   BUN 19 01/27/2018 0000   BUN 22 (H) 12/26/2012 1936   CREATININE 1.24 12/16/2019 1149   CREATININE 1.24  09/23/2015 1440   CALCIUM 9.8 12/16/2019 1149   CALCIUM 8.4 (L) 12/26/2012 1936   GFRNONAA >60 02/09/2015 1256   GFRNONAA 58 (L) 12/26/2012 1936   GFRAA >60 02/09/2015 1256   GFRAA >60 12/26/2012 1936    Lipid Panel     Component Value Date/Time   CHOL 205 (H) 12/16/2019 1149   TRIG 334.0 (H) 12/16/2019 1149   HDL 35.80 (L) 12/16/2019 1149   CHOLHDL 6 12/16/2019 1149   VLDL 66.8 (H) 12/16/2019 1149   LDLCALC 82 10/17/2018 0920    CBC    Component Value Date/Time   WBC 5.3 10/17/2018 0920   RBC 4.89 10/17/2018 0920   HGB 15.5 10/17/2018 0920   HGB 15.2 12/26/2012 1936   HCT 44.9 10/17/2018 0920   HCT 44.6 12/26/2012 1936   PLT 194.0 10/17/2018 0920   PLT 218 12/26/2012 1936   MCV 91.9 10/17/2018 0920   MCV 94 12/26/2012 1936   MCH 32.0 08/01/2017 0523   MCHC 34.5 10/17/2018 0920   RDW 12.8 10/17/2018 0920   RDW 13.3 12/26/2012 1936   LYMPHSABS 1.6 10/17/2018 0920   MONOABS 0.4 10/17/2018 0920   EOSABS 0.2 10/17/2018 0920   BASOSABS 0.1 10/17/2018 0920    Hgb A1C Lab Results  Component Value Date   HGBA1C 5.7 12/16/2019     Assessment and Plan:  Webb Silversmith, NP This visit occurred during the SARS-CoV-2 public health emergency.  Safety protocols were in place, including screening questions prior to the visit, additional usage of staff PPE, and extensive cleaning of exam room while observing appropriate contact time as indicated for disinfecting solutions.

## 2020-03-02 NOTE — Assessment & Plan Note (Signed)
Continue Citalopram and Wellbutrin Support offered

## 2020-03-02 NOTE — Assessment & Plan Note (Signed)
Stable off inhalers Will monitor 

## 2020-03-02 NOTE — Assessment & Plan Note (Signed)
Continue CPAP Will monitor 

## 2020-03-02 NOTE — Assessment & Plan Note (Signed)
Continue Flomax and Saw Palmetto

## 2020-03-02 NOTE — Assessment & Plan Note (Signed)
A1C reviewed Encouraged him to consume a low carb diet, exercise for weight loss No meds Encouraged yearly eye exam Encouraged routine foot exams He declines flu, will consider pneumovax Moderna vaccine UTD

## 2020-03-02 NOTE — Assessment & Plan Note (Signed)
Continue Pantoprazole Will monitor 

## 2020-03-05 ENCOUNTER — Encounter: Payer: Self-pay | Admitting: Internal Medicine

## 2020-04-06 DIAGNOSIS — G4733 Obstructive sleep apnea (adult) (pediatric): Secondary | ICD-10-CM | POA: Diagnosis not present

## 2020-04-07 ENCOUNTER — Telehealth: Payer: Self-pay

## 2020-04-07 NOTE — Telephone Encounter (Signed)
[place] Night - Client Nonclinical Telephone Record [facility]urse Client [place] Night - Client Client Site [place] Physician Webb Silversmith - NP Contact Type Call Who Is Calling Patient / Member / Family / Caregiver Caller Name Mylen Mangan Caller Phone Number [phone] Patient Name Corey Bell Patient DOB 09/28/1966 Call Type Message Only Information Provided Reason for Call Request to [facility] Appointment Initial Comment Needing to cancel appt for 04/08/20 at 10am. Additional Comment Disp. Time Disposition Final User 04/07/2020 7:05:15 AM General Information Provided Yes Silvano Rusk Call Closed By: Silvano Rusk Transaction Date/Time: 04/07/2020 7:03:40 AM (ET)

## 2020-04-08 ENCOUNTER — Ambulatory Visit: Payer: PPO | Admitting: Internal Medicine

## 2020-05-03 ENCOUNTER — Other Ambulatory Visit: Payer: Self-pay | Admitting: Internal Medicine

## 2020-05-18 ENCOUNTER — Other Ambulatory Visit: Payer: Self-pay | Admitting: Internal Medicine

## 2020-05-18 DIAGNOSIS — E782 Mixed hyperlipidemia: Secondary | ICD-10-CM

## 2020-05-27 ENCOUNTER — Encounter: Payer: Self-pay | Admitting: Internal Medicine

## 2020-05-27 ENCOUNTER — Ambulatory Visit
Admission: EM | Admit: 2020-05-27 | Discharge: 2020-05-27 | Disposition: A | Discharge status: Home / Self Care | Payer: PPO | Attending: Emergency Medicine | Admitting: Emergency Medicine

## 2020-05-27 ENCOUNTER — Other Ambulatory Visit: Payer: Self-pay

## 2020-05-27 DIAGNOSIS — J0100 Acute maxillary sinusitis, unspecified: Secondary | ICD-10-CM

## 2020-05-27 DIAGNOSIS — Z0189 Encounter for other specified special examinations: Secondary | ICD-10-CM | POA: Diagnosis not present

## 2020-05-27 MED ORDER — DOXYCYCLINE HYCLATE 100 MG PO CAPS
100.0000 mg | ORAL_CAPSULE | Freq: Two times a day (BID) | ORAL | 0 refills | Status: DC
Start: 2020-05-27 — End: 2021-04-25

## 2020-05-27 NOTE — Discharge Instructions (Signed)
Take the antibiotic as directed.  Follow up with your primary care provider if your symptoms are not improving.     

## 2020-05-27 NOTE — ED Triage Notes (Signed)
Patient complains of sinus drainage congestion x weeks but started noticing a cough on Monday.

## 2020-05-27 NOTE — ED Provider Notes (Signed)
Corey Bell    CSN: [id number] Arrival date & time: 05/27/20  2671      History   Chief Complaint Chief Complaint  Patient presents with   Cough    HPI Corey Bell is a 54 y.o. male.   Patient presents with 2 to 3-week history of sinus congestion, postnasal drip, runny nose, sinus pressure.  He started a nonproductive cough 4 days ago.  He denies fever, chills, shortness of breath, abdominal pain, vomiting, diarrhea, rash, or other symptoms.  His medical history includes COPD, hypertension, diabetes, PTSD, anemia, DDD.  The history is provided by the patient.    Past Medical History:  Diagnosis Date   Anxiety    Aphthous ulcer 10-04-12   improving   Arthritis    left index fnger   Carpal tunnel syndrome, bilateral    damaged nerve in neck   Chronic pain syndrome    COPD (chronic obstructive pulmonary disease) (HCC)    mild   Depression    Diabetes mellitus without complication ([place])    type 2 , has not been medicated in over 1 year, no meds since Gastric sleeve   Diverticulitis of colon    Esophageal reflux    H/O acute prostatitis    Hypothyroidism    Insomnia, unspecified    Mixed hyperlipidemia    Peyronie disease 12-20-12   PER uROLOGY   Seasonal allergies    Sleep apnea    uses cpap   Tympanic membrane perforation    history of    Patient Active Problem List   Diagnosis Date Noted   BPH (benign prostatic hyperplasia) 03/02/2020   Iron deficiency anemia 03/02/2020   Adjustment reaction with anxiety and depression 03/06/2019   Post-traumatic stress disorder, acute 01/27/2019   Diabetes mellitus type 2, diet-controlled ([place]) 07/31/2017   GERD (gastroesophageal reflux disease) 04/07/2015   HLD (hyperlipidemia) 06/03/2014   DDD (degenerative disc disease), cervical 10/13/2013   DDD (degenerative disc disease), lumbosacral 10/13/2013   OSA (obstructive sleep apnea) 07/01/2013   Essential hypertension 05/29/2013     Hypothyroidism    COPD (chronic obstructive pulmonary disease) ([place]) 01/07/2013    Past Surgical History:  Procedure Laterality Date   LAPAROSCOPIC GASTRIC SLEEVE RESECTION N/A 07/31/2017   Procedure: LAPAROSCOPIC GASTRIC SLEEVE RESECTION WITH UPPER ENDOSCOPY;  Surgeon: Alphonsa Overall, MD;  Location: [facility];  Service: General;  Laterality: N/A;   LumbarSacral Disc Surgery     L5-S1   MENISCUS REPAIR Left    torn meniscus   NESBIT PROCEDURE N/A 08/22/2013   Procedure: 16 DOT PLACTATION;  Surgeon: Claybon Jabs, MD;  Location: [facility];  Service: Urology;  Laterality: N/A;   TYMPANOSTOMY TUBE PLACEMENT  1975   ULNAR COLLATERAL LIGAMENT REPAIR Right 04/17/2019   Procedure: RIGHT THUMB ULNAR COLLATERAL LIGAMENT REPAIR;  Surgeon: Leanora Cover, MD;  Location: [facility];  Service: Orthopedics;  Laterality: Right;   ULNAR COLLATERAL LIGAMENT REPAIR Right 10/16/2019   Procedure: RIGHT THUMB ULNAR COLLATERAL LIGAMENT RECONSTRUCTION;  Surgeon: Leanora Cover, MD;  Location: [facility];  Service: Orthopedics;  Laterality: Right;  block in preop   VASECTOMY Bilateral 08/22/2013   Procedure: VASECTOMY;  Surgeon: Claybon Jabs, MD;  Location: [facility];  Service: Urology;  Laterality: Bilateral;       Home Medications    Prior to Admission medications   Medication Sig Start Date End Date Taking? Authorizing Provider  buPROPion (WELLBUTRIN XL) 300 MG 24  hr tablet Take 1 tablet (300 mg total) by mouth daily. 11/04/19  Yes Lucille Passy, MD  calcium carbonate (TUMS - DOSED IN MG ELEMENTAL CALCIUM) 500 MG chewable tablet Chew 3 tablets by mouth daily.   Yes [provider]  citalopram (CELEXA) 40 MG tablet Take 1 tablet (40 mg total) by mouth daily. 11/04/19  Yes Lucille Passy, MD  cyclobenzaprine (FLEXERIL) 10 MG tablet Take 1 tablet (10 mg total) by mouth at bedtime. 01/23/20  Yes Jearld Fenton, NP  diphenhydrAMINE  (BENADRYL) 25 mg capsule Take 50 mg by mouth at bedtime.   Yes [provider]  ezetimibe (ZETIA) 10 MG tablet Take 1 tablet (10 mg total) by mouth daily. 03/02/20  Yes Baity, Coralie Keens, NP  Ferrous Sulfate (IRON) 325 (65 Fe) MG TABS Take by mouth daily.    Yes [provider]  fexofenadine (ALLEGRA) 180 MG tablet Take 180 mg by mouth daily.   Yes [provider]  gabapentin (NEURONTIN) 800 MG tablet TAKE 1 TABLET BY MOUTH 4 TIMES A DAY 05/03/20  Yes Baity, Coralie Keens, NP  levothyroxine (SYNTHROID) 75 MCG tablet Take 1 tablet (75 mcg total) by mouth daily. 11/04/19  Yes Lucille Passy, MD  methocarbamol (ROBAXIN) 500 MG tablet TAKE 2 TABLETS BY MOUTH EVERY 6 HOURS ASNEEDED FOR MUSCLE SPASMS 10/20/19  Yes Lucille Passy, MD  montelukast (SINGULAIR) 10 MG tablet Take 1 tablet (10 mg total) by mouth at bedtime. 11/04/19  Yes Lucille Passy, MD  pantoprazole (PROTONIX) 40 MG tablet Take 1 tablet (40 mg total) by mouth daily. 11/04/19  Yes Lucille Passy, MD  Polyvinyl Alcohol (LIQUID TEARS OP) Place 1 drop into both eyes as needed (for dry eyes).   Yes [provider]  Saw Palmetto 450 MG CAPS Take 4 capsules by mouth daily.    Yes [provider]  tamsulosin (FLOMAX) 0.4 MG CAPS capsule Take 1 capsule (0.4 mg total) by mouth 2 (two) times daily. 11/04/19  Yes Lucille Passy, MD  Thiamine HCl (VITAMIN B-1) 250 MG tablet Take 1 tablet by mouth daily.   Yes [provider]  traMADol (ULTRAM) 50 MG tablet TAKE 1 TABLET BY MOUTH EVERY 8 HOURS AS NEEDED FOR MODERATE TO SEVEREPAIN 10/20/19  Yes Lucille Passy, MD  Trolamine Salicylate (ASPERCREME EX) Apply 1 application topically as needed (for pain).   Yes [provider]  vitamin B-12 (CYANOCOBALAMIN) 1000 MCG tablet Take 1 tablet by mouth daily.   Yes [provider]  acetaminophen (TYLENOL) 500 MG tablet Take 500 mg by mouth every 6 (six) hours as needed.    [provider]  aspirin 325 MG  tablet Take 325 mg by mouth as needed.    [provider]  celecoxib (CELEBREX) 200 MG capsule Take 1 capsule (200 mg total) by mouth daily. 01/23/20   Jearld Fenton, NP  doxycycline (VIBRAMYCIN) 100 MG capsule Take 1 capsule (100 mg total) by mouth 2 (two) times daily. 05/27/20   Sharion Balloon, NP  gabapentin (NEURONTIN) 800 MG tablet  10/28/19   [provider]    Family History Family History  Problem Relation Age of Onset   Congestive Heart Failure Father    Heart disease Father    COPD Father    Kidney cancer Paternal Grandfather    Heart disease Brother        stents at 12 yo   Bladder Cancer Paternal Uncle  Lupus Brother    Heart failure Paternal Grandmother    Parkinsonism Neg Hx     Social History Social History   Tobacco Use   Smoking status: Former Smoker    Packs/day: 1.50    Years: 20.00    Pack years: 30.00    Types: Cigarettes    Quit date: 10/09/2006    Years since quitting: 13.6   Smokeless tobacco: Never Used  Vaping Use   Vaping Use: Never used  Substance Use Topics   Alcohol use: Yes    Alcohol/week: 1.0 standard drink    Types: 1 Cans of beer per week    Comment: rarely   Drug use: No     Allergies   Augmentin [amoxicillin-pot clavulanate], Demerol [meperidine], Eggs or egg-derived products, Sulfamethoxazole, Statins, Sulfa antibiotics, and Sulfacetamide sodium   Review of Systems Review of Systems  Constitutional: Negative for chills and fever.  HENT: Positive for congestion, postnasal drip, rhinorrhea and sinus pressure. Negative for ear pain and sore throat.   Eyes: Negative for pain and visual disturbance.  Respiratory: Positive for cough. Negative for shortness of breath.   Cardiovascular: Negative for chest pain and palpitations.  Gastrointestinal: Negative for abdominal pain and vomiting.  Genitourinary: Negative for dysuria and hematuria.  Musculoskeletal: Negative for arthralgias and back pain.    Skin: Negative for color change and rash.  Neurological: Negative for seizures and syncope.  All other systems reviewed and are negative.    Physical Exam Triage Vital Signs ED Triage Vitals  Enc Vitals Group     BP      Pulse      Resp      Temp      Temp src      SpO2      Weight      Height      Head Circumference      Peak Flow      Pain Score      Pain Loc      Pain Edu?      Excl. in [place]?    No data found.  Updated Vital Signs BP 123/80 (BP Location: Left Arm)    Pulse 63    Temp 98.6 F (37 C) (Oral)    Resp 18    Ht 6\' 2"  (1.88 m)    Wt 255 lb (115.7 kg)    SpO2 97%    BMI 32.74 kg/m   Visual Acuity Right Eye Distance:   Left Eye Distance:   Bilateral Distance:    Right Eye Near:   Left Eye Near:    Bilateral Near:     Physical Exam Vitals and nursing note reviewed.  Constitutional:      General: He is not in acute distress.    Appearance: He is well-developed. He is not ill-appearing.  HENT:     Head: Normocephalic and atraumatic.     Right Ear: Tympanic membrane normal.     Left Ear: Tympanic membrane normal.     Nose: Congestion and rhinorrhea present.     Mouth/Throat:     Mouth: Mucous membranes are moist.     Pharynx: Oropharynx is clear.  Eyes:     Conjunctiva/sclera: Conjunctivae normal.  Cardiovascular:     Rate and Rhythm: Normal rate and regular rhythm.     Heart sounds: No murmur heard.   Pulmonary:     Effort: Pulmonary effort is normal. No respiratory distress.     Breath sounds: Normal breath  sounds. No wheezing or rhonchi.  Abdominal:     Palpations: Abdomen is soft.     Tenderness: There is no abdominal tenderness. There is no guarding or rebound.  Musculoskeletal:     Cervical back: Neck supple.  Skin:    General: Skin is warm and dry.     Findings: No rash.  Neurological:     General: No focal deficit present.     Mental Status: He is alert and oriented to person, place, and time.     Gait: Gait normal.   Psychiatric:        Mood and Affect: Mood normal.        Behavior: Behavior normal.      UC Treatments / Results  Labs (all labs ordered are listed, but only abnormal results are displayed) Labs Reviewed  NOVEL CORONAVIRUS, NAA    EKG   Radiology No results found.  Procedures Procedures (including critical care time)  Medications Ordered in UC Medications - No data to display  Initial Impression / Assessment and Plan / UC Course  I have reviewed the triage vital signs and the nursing notes.  Pertinent labs & imaging results that were available during my care of the patient were reviewed by me and considered in my medical decision making (see chart for details).   Acute sinusitis.  Treating with doxycycline.  Instructed patient to follow-up with his PCP if his symptoms are not improving.  Patient agrees to plan of care.   Final Clinical Impressions(s) / UC Diagnoses   Final diagnoses:  Patient request for diagnostic testing  Acute non-recurrent maxillary sinusitis     Discharge Instructions     Take the antibiotic as directed.    Follow up with your primary care provider if your symptoms are not improving.       ED Prescriptions    Medication Sig Dispense Auth. Provider   doxycycline (VIBRAMYCIN) 100 MG capsule Take 1 capsule (100 mg total) by mouth 2 (two) times daily. 20 capsule Sharion Balloon, NP     PDMP not reviewed this encounter.   Sharion Balloon, NP 05/27/20 [phone]

## 2020-05-29 LAB — SARS-COV-2, NAA 2 DAY TAT

## 2020-05-29 LAB — NOVEL CORONAVIRUS, NAA: SARS-CoV-2, NAA: NOT DETECTED

## 2020-06-02 ENCOUNTER — Other Ambulatory Visit: Payer: Self-pay

## 2020-06-02 ENCOUNTER — Emergency Department: Payer: PPO

## 2020-06-02 ENCOUNTER — Emergency Department
Admission: EM | Admit: 2020-06-02 | Discharge: 2020-06-02 | Disposition: A | Discharge status: Home / Self Care | Payer: PPO | Attending: Emergency Medicine | Admitting: Emergency Medicine

## 2020-06-02 ENCOUNTER — Encounter: Payer: Self-pay | Admitting: Emergency Medicine

## 2020-06-02 DIAGNOSIS — J449 Chronic obstructive pulmonary disease, unspecified: Secondary | ICD-10-CM | POA: Diagnosis not present

## 2020-06-02 DIAGNOSIS — Z79890 Hormone replacement therapy (postmenopausal): Secondary | ICD-10-CM | POA: Insufficient documentation

## 2020-06-02 DIAGNOSIS — Z79899 Other long term (current) drug therapy: Secondary | ICD-10-CM | POA: Insufficient documentation

## 2020-06-02 DIAGNOSIS — Z7982 Long term (current) use of aspirin: Secondary | ICD-10-CM | POA: Diagnosis not present

## 2020-06-02 DIAGNOSIS — Z87891 Personal history of nicotine dependence: Secondary | ICD-10-CM | POA: Diagnosis not present

## 2020-06-02 DIAGNOSIS — E119 Type 2 diabetes mellitus without complications: Secondary | ICD-10-CM | POA: Insufficient documentation

## 2020-06-02 DIAGNOSIS — R0602 Shortness of breath: Secondary | ICD-10-CM | POA: Diagnosis not present

## 2020-06-02 DIAGNOSIS — J9801 Acute bronchospasm: Secondary | ICD-10-CM

## 2020-06-02 DIAGNOSIS — E039 Hypothyroidism, unspecified: Secondary | ICD-10-CM | POA: Insufficient documentation

## 2020-06-02 DIAGNOSIS — I10 Essential (primary) hypertension: Secondary | ICD-10-CM | POA: Insufficient documentation

## 2020-06-02 LAB — CBC WITH DIFFERENTIAL/PLATELET
Abs Immature Granulocytes: 0.01 10*3/uL (ref 0.00–0.07)
Basophils Absolute: 0.1 10*3/uL (ref 0.0–0.1)
Basophils Relative: 1 %
Eosinophils Absolute: 0.2 10*3/uL (ref 0.0–0.5)
Eosinophils Relative: 5 %
HCT: 41.5 % (ref 39.0–52.0)
Hemoglobin: 15 g/dL (ref 13.0–17.0)
Immature Granulocytes: 0 %
Lymphocytes Relative: 35 %
Lymphs Abs: 1.9 10*3/uL (ref 0.7–4.0)
MCH: 32.1 pg (ref 26.0–34.0)
MCHC: 36.1 g/dL — ABNORMAL HIGH (ref 30.0–36.0)
MCV: 88.7 fL (ref 80.0–100.0)
Monocytes Absolute: 0.4 10*3/uL (ref 0.1–1.0)
Monocytes Relative: 7 %
Neutro Abs: 2.7 10*3/uL (ref 1.7–7.7)
Neutrophils Relative %: 52 %
Platelets: 167 10*3/uL (ref 150–400)
RBC: 4.68 MIL/uL (ref 4.22–5.81)
RDW: 12.6 % (ref 11.5–15.5)
WBC: 5.2 10*3/uL (ref 4.0–10.5)
nRBC: 0 % (ref 0.0–0.2)

## 2020-06-02 LAB — COMPREHENSIVE METABOLIC PANEL
ALT: 22 U/L (ref 0–44)
AST: 27 U/L (ref 15–41)
Albumin: 4.2 g/dL (ref 3.5–5.0)
Alkaline Phosphatase: 63 U/L (ref 38–126)
Anion gap: 7 (ref 5–15)
BUN: 18 mg/dL (ref 6–20)
CO2: 27 mmol/L (ref 22–32)
Calcium: 9 mg/dL (ref 8.9–10.3)
Chloride: 107 mmol/L (ref 98–111)
Creatinine, Ser: 1 mg/dL (ref 0.61–1.24)
GFR calc Af Amer: >60 mL/min (ref >60)
GFR calc non Af Amer: >60 mL/min (ref >60)
Glucose, Bld: 138 mg/dL — ABNORMAL HIGH (ref 70–99)
Potassium: 4 mmol/L (ref 3.5–5.1)
Sodium: 141 mmol/L (ref 135–145)
Total Bilirubin: 1.8 mg/dL — ABNORMAL HIGH (ref 0.3–1.2)
Total Protein: 7.1 g/dL (ref 6.5–8.1)

## 2020-06-02 LAB — TROPONIN I (HIGH SENSITIVITY): Troponin I (High Sensitivity): 2 ng/L (ref <18)

## 2020-06-02 MED ORDER — ALBUTEROL SULFATE HFA 108 (90 BASE) MCG/ACT IN AERS: 2.0000 | INHALATION_SPRAY | Freq: Four times a day (QID) | RESPIRATORY_TRACT | 2 refills | Status: DC | PRN

## 2020-06-02 MED ORDER — IOHEXOL 350 MG/ML SOLN
75.0000 mL | Freq: Once | INTRAVENOUS | Status: AC | PRN
  Administered 2020-06-02: 75 mL via INTRAVENOUS

## 2020-06-02 MED ORDER — IPRATROPIUM-ALBUTEROL 0.5-2.5 (3) MG/3ML IN SOLN
3.0000 mL | Freq: Once | RESPIRATORY_TRACT | Status: AC
  Administered 2020-06-02: 3 mL via RESPIRATORY_TRACT
  Filled 2020-06-02: qty 3

## 2020-06-02 MED ORDER — PREDNISONE 50 MG PO TABS: 50.0000 mg | ORAL_TABLET | Freq: Every day | ORAL | 0 refills | Status: DC

## 2020-06-02 NOTE — ED Provider Notes (Signed)
[facility] Emergency Department Provider Note   ____________________________________________    I have reviewed the triage vital signs and the nursing notes.   HISTORY  Chief Complaint Shortness of Breath     HPI Corey Bell is a 54 y.o. male with a history as noted below who presents with complaints of shortness of breath.  Patient reports over the last 1 week he has felt like he has a chest cold and his sinuses have been draining however he has not been coughing.  No fevers or chills or body aches.  Was prescribed doxycycline by urgent care without improvement.  Quit smoking 9 years ago.  No sick contacts reported.  Has been vaccinated against COVID-19.  Feels like symptoms are worse when lying flat  Past Medical History:  Diagnosis Date  . Anxiety   . Aphthous ulcer 10-04-12   improving  . Arthritis    left index fnger  . Carpal tunnel syndrome, bilateral    damaged nerve in neck  . Chronic pain syndrome   . COPD (chronic obstructive pulmonary disease) (HCC)    mild  . Depression   . Diabetes mellitus without complication ([place])    type 2 , has not been medicated in over 1 year, no meds since Gastric sleeve  . Diverticulitis of colon   . Esophageal reflux   . H/O acute prostatitis   . Hypothyroidism   . Insomnia, unspecified   . Mixed hyperlipidemia   . Peyronie disease 12-20-12   PER uROLOGY  . Seasonal allergies   . Sleep apnea    uses cpap  . Tympanic membrane perforation    history of    Patient Active Problem List   Diagnosis Date Noted  . BPH (benign prostatic hyperplasia) 03/02/2020  . Iron deficiency anemia 03/02/2020  . Adjustment reaction with anxiety and depression 03/06/2019  . Post-traumatic stress disorder, acute 01/27/2019  . Diabetes mellitus type 2, diet-controlled ([place]) 07/31/2017  . GERD (gastroesophageal reflux disease) 04/07/2015  . HLD (hyperlipidemia) 06/03/2014  . DDD (degenerative disc disease),  cervical 10/13/2013  . DDD (degenerative disc disease), lumbosacral 10/13/2013  . OSA (obstructive sleep apnea) 07/01/2013  . Essential hypertension 05/29/2013  . Hypothyroidism   . COPD (chronic obstructive pulmonary disease) ([place]) 01/07/2013    Past Surgical History:  Procedure Laterality Date  . LAPAROSCOPIC GASTRIC SLEEVE RESECTION N/A 07/31/2017   Procedure: LAPAROSCOPIC GASTRIC SLEEVE RESECTION WITH UPPER ENDOSCOPY;  Surgeon: Alphonsa Overall, MD;  Location: [facility];  Service: General;  Laterality: N/A;  . LumbarSacral Disc Surgery     L5-S1  . MENISCUS REPAIR Left    torn meniscus  . NESBIT PROCEDURE N/A 08/22/2013   Procedure: 16 DOT PLACTATION;  Surgeon: Claybon Jabs, MD;  Location: [facility];  Service: Urology;  Laterality: N/A;  . TYMPANOSTOMY TUBE PLACEMENT  1975  . ULNAR COLLATERAL LIGAMENT REPAIR Right 04/17/2019   Procedure: RIGHT THUMB ULNAR COLLATERAL LIGAMENT REPAIR;  Surgeon: Leanora Cover, MD;  Location: [place];  Service: Orthopedics;  Laterality: Right;  . ULNAR COLLATERAL LIGAMENT REPAIR Right 10/16/2019   Procedure: RIGHT THUMB ULNAR COLLATERAL LIGAMENT RECONSTRUCTION;  Surgeon: Leanora Cover, MD;  Location: [place];  Service: Orthopedics;  Laterality: Right;  block in preop  . VASECTOMY Bilateral 08/22/2013   Procedure: VASECTOMY;  Surgeon: Claybon Jabs, MD;  Location: [facility];  Service: Urology;  Laterality: Bilateral;    Prior to Admission medications  Medication Sig Start Date End Date Taking? Authorizing Provider  acetaminophen (TYLENOL) 500 MG tablet Take 500 mg by mouth every 6 (six) hours as needed.    [provider]  aspirin 325 MG tablet Take 325 mg by mouth as needed.    [provider]  buPROPion (WELLBUTRIN XL) 300 MG 24 hr tablet Take 1 tablet (300 mg total) by mouth daily. 11/04/19   Lucille Passy, MD  calcium carbonate (TUMS - DOSED IN MG ELEMENTAL CALCIUM) 500  MG chewable tablet Chew 3 tablets by mouth daily.    [provider]  celecoxib (CELEBREX) 200 MG capsule Take 1 capsule (200 mg total) by mouth daily. 01/23/20   Jearld Fenton, NP  citalopram (CELEXA) 40 MG tablet Take 1 tablet (40 mg total) by mouth daily. 11/04/19   Lucille Passy, MD  cyclobenzaprine (FLEXERIL) 10 MG tablet Take 1 tablet (10 mg total) by mouth at bedtime. 01/23/20   Jearld Fenton, NP  diphenhydrAMINE (BENADRYL) 25 mg capsule Take 50 mg by mouth at bedtime.    [provider]  doxycycline (VIBRAMYCIN) 100 MG capsule Take 1 capsule (100 mg total) by mouth 2 (two) times daily. 05/27/20   Sharion Balloon, NP  ezetimibe (ZETIA) 10 MG tablet Take 1 tablet (10 mg total) by mouth daily. 03/02/20   Jearld Fenton, NP  Ferrous Sulfate (IRON) 325 (65 Fe) MG TABS Take by mouth daily.     [provider]  fexofenadine (ALLEGRA) 180 MG tablet Take 180 mg by mouth daily.    [provider]  gabapentin (NEURONTIN) 800 MG tablet TAKE 1 TABLET BY MOUTH 4 TIMES A DAY 05/03/20   Jearld Fenton, NP  levothyroxine (SYNTHROID) 75 MCG tablet Take 1 tablet (75 mcg total) by mouth daily. 11/04/19   Lucille Passy, MD  methocarbamol (ROBAXIN) 500 MG tablet TAKE 2 TABLETS BY MOUTH EVERY 6 HOURS ASNEEDED FOR MUSCLE SPASMS 10/20/19   Lucille Passy, MD  montelukast (SINGULAIR) 10 MG tablet Take 1 tablet (10 mg total) by mouth at bedtime. 11/04/19   Lucille Passy, MD  pantoprazole (PROTONIX) 40 MG tablet Take 1 tablet (40 mg total) by mouth daily. 11/04/19   Lucille Passy, MD  Polyvinyl Alcohol (LIQUID TEARS OP) Place 1 drop into both eyes as needed (for dry eyes).    [provider]  Saw Palmetto 450 MG CAPS Take 4 capsules by mouth daily.     [provider]  tamsulosin (FLOMAX) 0.4 MG CAPS capsule Take 1 capsule (0.4 mg total) by mouth 2 (two) times daily. 11/04/19   Lucille Passy, MD  Thiamine HCl (VITAMIN B-1) 250 MG tablet Take 1 tablet by mouth daily.     [provider]  traMADol (ULTRAM) 50 MG tablet TAKE 1 TABLET BY MOUTH EVERY 8 HOURS AS NEEDED FOR MODERATE TO Juliene Pina 10/20/19   Lucille Passy, MD  Trolamine Salicylate (ASPERCREME EX) Apply 1 application topically as needed (for pain).    [provider]  vitamin B-12 (CYANOCOBALAMIN) 1000 MCG tablet Take 1 tablet by mouth daily.    [provider]  gabapentin (NEURONTIN) 800 MG tablet  10/28/19   [provider]     Allergies Augmentin [amoxicillin-pot clavulanate], Demerol [meperidine], Eggs or egg-derived products, Sulfamethoxazole, Statins, Sulfa antibiotics, and Sulfacetamide sodium  Family History  Problem Relation Age of Onset  . Congestive Heart Failure Father   . Heart disease Father   . COPD  Father   . Kidney cancer Paternal Grandfather   . Heart disease Brother        stents at 61 yo  . Bladder Cancer Paternal Uncle   . Lupus Brother   . Heart failure Paternal Grandmother   . Parkinsonism Neg Hx     Social History Social History   Tobacco Use  . Smoking status: Former Smoker    Packs/day: 1.50    Years: 20.00    Pack years: 30.00    Types: Cigarettes    Quit date: 10/09/2006    Years since quitting: 13.6  . Smokeless tobacco: Never Used  Vaping Use  . Vaping Use: Never used  Substance Use Topics  . Alcohol use: Yes    Alcohol/week: 1.0 standard drink    Types: 1 Cans of beer per week    Comment: rarely  . Drug use: No    Review of Systems  Constitutional: No fever/chills Eyes: No visual changes.  ENT: As above Cardiovascular: As above. Respiratory: As above Gastrointestinal: No abdominal pain.  No nausea, no vomiting.   Genitourinary: No GU complaints Musculoskeletal: Negative for body aches Skin: Negative for rash. Neurological: Negative for headaches    ____________________________________________   PHYSICAL EXAM:  VITAL SIGNS: ED Triage Vitals  Enc Vitals Group     BP --      Pulse --      Resp --       Temp --      Temp src --      SpO2 --      Weight 06/02/20 0429 113.4 kg (250 lb)     Height 06/02/20 0429 1.88 m (6\' 2" )     Head Circumference --      Peak Flow --      Pain Score 06/02/20 0428 0     Pain Loc --      Pain Edu? --      Excl. in [place]? --     Constitutional: Alert and oriented. . Pleasant and interactive  Nose: No congestion/rhinnorhea. Mouth/Throat: Mucous membranes are moist.    Cardiovascular: Normal rate, regular rhythm. Grossly normal heart sounds.  Good peripheral circulation.  No significant wheezing noted Respiratory: Normal respiratory effort.  No retractions. Lungs CTAB. Gastrointestinal: Soft and nontender. No distention.  No CVA tenderness.  Musculoskeletal: No lower extremity tenderness nor edema.  Warm and well perfused Neurologic:  Normal speech and language. No gross focal neurologic deficits are appreciated.  Skin:  Skin is warm, dry and intact. No rash noted. Psychiatric: Mood and affect are normal. Speech and behavior are normal.  ____________________________________________   LABS (all labs ordered are listed, but only abnormal results are displayed)  Labs Reviewed  CBC WITH DIFFERENTIAL/PLATELET - Abnormal; Notable for the following components:      Result Value   MCHC 36.1 (*)    All other components within normal limits  COMPREHENSIVE METABOLIC PANEL - Abnormal; Notable for the following components:   Glucose, Bld 138 (*)    Total Bilirubin 1.8 (*)    All other components within normal limits  TROPONIN I (HIGH SENSITIVITY)   ____________________________________________  EKG  ED ECG REPORT I, Lavonia Drafts, the attending physician, personally viewed and interpreted this ECG.  Date: 06/02/2020  Rhythm: Sinus bradycardia QRS Axis: normal Intervals: normal ST/T Wave abnormalities: normal Narrative Interpretation: no evidence of acute ischemia  ____________________________________________  RADIOLOGY  Chest x-ray  reviewed by me, no infiltrate effusion or pneumothorax ____________________________________________   PROCEDURES  Procedure(s) performed: No  Procedures   Critical Care performed: No ____________________________________________   INITIAL IMPRESSION / ASSESSMENT AND PLAN / ED COURSE  Pertinent labs & imaging results that were available during my care of the patient were reviewed by me and considered in my medical decision making (see chart for details).  Patient presents with complaints of shortness of breath as noted above, some chest tightness.  Differential includes bronchospasm/COPD, less likely pneumonia, URI including COVID-19.  No pleurisy to suggest PE  Chest x-ray is clear without infiltrate or effusion.  No fevers chills myalgias to suggest COVID-19 infection and patient has been vaccinated.  Lab work is quite reassuring, EKG and troponin are normal.  We will trial DuoNeb to see if improvement in symptoms  Brief improvement with DuoNeb. We will proceed with CT angiography to rule out PE.  CT angiography negative for PE, suspect bronchospasm, will trial steroids, outpatient follow-up with PCP if no improvement     ____________________________________________   FINAL CLINICAL IMPRESSION(S) / ED DIAGNOSES  Final diagnoses:  SOB (shortness of breath)  Bronchospasm        Note:  This document was prepared using Dragon voice recognition software and may include unintentional dictation errors.   Lavonia Drafts, MD 06/02/20 [phone]

## 2020-06-02 NOTE — ED Notes (Signed)
Patient appropriate and cooperative. NAD noted  

## 2020-06-02 NOTE — ED Notes (Signed)
Patient using his nebulizer

## 2020-06-02 NOTE — ED Notes (Signed)
Patient wife at bedside

## 2020-06-02 NOTE — ED Triage Notes (Signed)
Patient ambulatory to triage with steady gait, without difficulty or distress noted; st [facility] 1-2wks; seen last Thursday for same, findings WNL

## 2020-06-02 NOTE — ED Notes (Signed)
EDP, Kinner to bedside; update provided.

## 2020-06-02 NOTE — ED Notes (Signed)
Patient stated he is here due to shortness of breath for about 10 days, he went to urgent care last week and still does not feel any better. Patient denies having any other symptoms

## 2020-06-03 ENCOUNTER — Ambulatory Visit: Payer: PPO

## 2020-06-03 ENCOUNTER — Other Ambulatory Visit: Payer: PPO

## 2020-06-10 ENCOUNTER — Other Ambulatory Visit (INDEPENDENT_AMBULATORY_CARE_PROVIDER_SITE_OTHER): Payer: PPO

## 2020-06-10 ENCOUNTER — Ambulatory Visit (INDEPENDENT_AMBULATORY_CARE_PROVIDER_SITE_OTHER): Payer: PPO

## 2020-06-10 ENCOUNTER — Other Ambulatory Visit: Payer: Self-pay

## 2020-06-10 DIAGNOSIS — Z23 Encounter for immunization: Secondary | ICD-10-CM

## 2020-06-10 DIAGNOSIS — E119 Type 2 diabetes mellitus without complications: Secondary | ICD-10-CM

## 2020-06-10 DIAGNOSIS — E782 Mixed hyperlipidemia: Secondary | ICD-10-CM

## 2020-06-10 LAB — LIPID PANEL
Cholesterol: 157 mg/dL (ref 0–200)
HDL: 42.5 mg/dL (ref >39.00)
LDL Cholesterol: 85 mg/dL (ref 0–99)
NonHDL: 114.57
Total CHOL/HDL Ratio: 4
Triglycerides: 149 mg/dL (ref 0.0–149.0)
VLDL: 29.8 mg/dL (ref 0.0–40.0)

## 2020-06-17 ENCOUNTER — Telehealth: Payer: PPO | Admitting: Emergency Medicine

## 2020-06-17 DIAGNOSIS — J019 Acute sinusitis, unspecified: Secondary | ICD-10-CM | POA: Diagnosis not present

## 2020-06-17 MED ORDER — FLUTICASONE PROPIONATE 50 MCG/ACT NA SUSP: 2.0000 | Freq: Every day | NASAL | 0 refills | Status: DC

## 2020-06-17 MED ORDER — SALINE SPRAY 0.65 % NA SOLN: 1.0000 | NASAL | 0 refills | Status: DC | PRN

## 2020-06-17 NOTE — Progress Notes (Signed)
We are sorry that you are not feeling well.  Here is how we plan to help!  Based on what you have shared with me it looks like you have sinusitis.  Sinusitis is inflammation and infection in the sinus cavities of the head.  Based on your presentation I believe you most likely have Acute Viral Sinusitis.This is an infection most likely caused by a virus. There is not specific treatment for viral sinusitis other than to help you with the symptoms until the infection runs its course.  You may use an oral decongestant such as Mucinex D or if you have glaucoma or high blood pressure use plain Mucinex. Saline nasal spray help and can safely be used as often as needed for congestion, I have prescribed: Fluticasone nasal spray two sprays in each nostril once a day.  Also, try nasal saline sprays.  Antibiotics are generally not indicated in the symptoms that you describe.  IF you continue to have symptoms after 7-10 days, you may need an antibiotic.   If your COVID test came back negative, or comes back negative, you can return to work after you have been fever free for 24 hours.  Some authorities believe that zinc sprays or the use of Echinacea may shorten the course of your symptoms.  Sinus infections are not as easily transmitted as other respiratory infection, however we still recommend that you avoid close contact with loved ones, especially the very young and elderly.  Remember to wash your hands thoroughly throughout the day as this is the number one way to prevent the spread of infection!  Home Care:  Only take medications as instructed by your medical team.  Do not take these medications with alcohol.  A steam or ultrasonic humidifier can help congestion.  You can place a towel over your head and breathe in the steam from hot water coming from a faucet.  Avoid close contacts especially the very young and the elderly.  Cover your mouth when you cough or sneeze.  Always remember to wash your  hands.  Get Help Right Away If:  You develop worsening fever or sinus pain.  You develop a severe head ache or visual changes.  Your symptoms persist after you have completed your treatment plan.  Make sure you  Understand these instructions.  Will watch your condition.  Will get help right away if you are not doing well or get worse.  Your e-visit answers were reviewed by a board certified advanced clinical practitioner to complete your personal care plan.  Depending on the condition, your plan could have included both over the counter or prescription medications.  If there is a problem please reply  once you have received a response from your provider.  Your safety is important to [place].  If you have drug allergies check your prescription carefully.    You can use MyChart to ask questions about today's visit, request a non-urgent call back, or ask for a work or school excuse for 24 hours related to this e-Visit. If it has been greater than 24 hours you will need to follow up with your provider, or enter a new e-Visit to address those concerns.  You will get an e-mail in the next two days asking about your experience.  I hope that your e-visit has been valuable and will speed your recovery. Thank you for using e-visits.  Approximately 5 minutes was used in reviewing the patient's chart, questionnaire, prescribing medications, and documentation.

## 2020-06-21 ENCOUNTER — Ambulatory Visit
Admission: EM | Admit: 2020-06-21 | Discharge: 2020-06-21 | Disposition: A | Discharge status: Home / Self Care | Payer: PPO | Attending: Internal Medicine | Admitting: Internal Medicine

## 2020-06-21 DIAGNOSIS — Z0189 Encounter for other specified special examinations: Secondary | ICD-10-CM

## 2020-06-21 DIAGNOSIS — Z20822 Contact with and (suspected) exposure to covid-19: Secondary | ICD-10-CM | POA: Diagnosis not present

## 2020-06-21 NOTE — Discharge Instructions (Signed)

## 2020-06-23 ENCOUNTER — Encounter: Payer: Self-pay | Admitting: Internal Medicine

## 2020-06-24 ENCOUNTER — Ambulatory Visit: Payer: PPO | Admitting: Internal Medicine

## 2020-06-24 NOTE — Telephone Encounter (Signed)
Patient advised to go to urgent care as he has no virtual video capability

## 2020-06-24 NOTE — Telephone Encounter (Signed)
I spoke with pt; pt said was tested at [facility] on 06/21/20 and has not gotten results yet; pts wife in hospital with + covid and pneumonia. Fever 100.1; chills and body aches,tired, when takes deep breath causes chest to hurt and feels like a lot of congestion in chest; pt said since taking tylenol pt is not having labored breathing or SOB; pt had H/A earlier this morning but none now; pt does have S/T but no other covid symptoms. Pt said he is very tired and does not feel like driving anywhere to be seen at [facility] or get xrays if needed. Pt said he is feeling some better and does not think he is having major problem with chest. UC & ED precautions given and if pt worsens will call 911 and go to ED since pt cannot drive. Pt is drinking plenty of fluids, resting and taking tylenol for fever and is self quarantining. FYI to Avie Echevaria NP and Larene Beach RN who will attach access nurse note.

## 2020-06-24 NOTE — Telephone Encounter (Signed)
[facility] Night - Client TELEPHONE ADVICE RECORD AccessNurse Patient Name: Corey Bell Gender: Male DOB: [birth year] Age: 54 Y 38 M 6 D Return Phone Number: [phone] (Primary) Address: City/State/Zip: [place] [address] Client [facility] - Client Client Site [place] Physician Webb Silversmith - NP Contact Type Call Who Is Calling Patient / Member / Family / Caregiver Call Type Triage / Clinical Relationship To Patient Self Return Phone Number [phone] (Primary) Chief Complaint BREATHING - shortness of breath or sounds breathless Reason for Call Symptomatic / Request for Health Information Initial Comment caller states his wife was diagnosed with covid on Monday and in the hospital, he's been tested but the results have not come back yet, currently he's running a fever and body aches, breathing is labored Translation No Nurse Assessment Nurse: Harlow Mares, RN, Suanne Marker Date/Time (Eastern Time): 06/24/2020 8:09:54 AM Confirm and document reason for call. If symptomatic, describe symptoms. ---caller states his wife was diagnosed with covid on Monday and in the hospital, he's been tested but the results have not come back yet, currently he's running a fever and body aches, breathing is labored. Taking a deep breath makes his chest hurting. Temp is 100.1 Has the patient had close contact with a person known or suspected to have the novel coronavirus illness OR traveled / lives in area with major community spread (including international travel) in the last 14 days from the onset of symptoms? * If Asymptomatic, screen for exposure and travel within the last 14 days. ---Yes Does the patient have any new or worsening symptoms? ---Yes Will a triage be completed? ---Yes Related visit to physician within the last 2 weeks? ---No Does the PT have any chronic conditions? (i.e. diabetes, asthma, this  includes High risk factors for pregnancy, etc.) ---Yes List chronic conditions. ---diabetes; depression/anxiety Is this a behavioral health or substance abuse call? ---NoPLEASE NOTE: All timestamps contained within this report are represented as [place] Standard Time. CONFIDENTIALTY NOTICE: This fax transmission is intended only for the addressee. It contains information that is legally privileged, confidential or otherwise protected from use or disclosure. If you are not the intended recipient, you are strictly prohibited from reviewing, disclosing, copying using or disseminating any of this information or taking any action in reliance on or regarding this information. If you have received this fax in error, please notify us immediately by telephone so that we can arrange for its return to [place]. Phone: [phone], Toll-Free: [phone], Fax: [phone] Page: 2 of 2 Call Id: [id number] Guidelines Guideline Title Affirmed Question Affirmed Notes Nurse Date/Time Eilene Ghazi Time) COVID-19 - Diagnosed or Suspected [1] COVID-19 infection suspected by caller or triager AND [2] mild symptoms (cough, fever, or others) AND [3] negative COVID-19 rapid test Harlow Mares, RN, Suanne Marker 06/24/2020 8:12:14 AM Disp. Time Eilene Ghazi Time) Disposition Final User 06/24/2020 8:08:14 AM Send to Urgent Daron Offer, Lanette 06/24/2020 8:13:14 AM Send To RN Personal Harlow Mares, RN, Rhonda 06/24/2020 8:23:18 AM Call PCP within 24 Hours Yes Harlow Mares, RN, Rosalyn Charters Disagree/Comply Comply Caller Understands Yes PreDisposition Call Doctor Care Advice Given Per Guideline CALL PCP WITHIN 24 HOURS: * You need to discuss this with your doctor (or NP/PA) within the next 24 hours. * IF OFFICE WILL BE OPEN: Call the office when it opens tomorrow morning. GENERAL CARE ADVICE FOR COVID-19 SYMPTOMS: * Cough: Use cough drops. * Feeling dehydrated: Drink extra liquids. If the air in your home is dry, use a humidifier. *  Fever: For fever  over 101 F (38.3 C), take acetaminophen every 4 to 6 hours (Adults 650 mg) OR ibuprofen every 6 to 8 hours (Adults 400 mg). Before taking any medicine, read all the instructions on the package. Do not take aspirin unless your doctor has prescribed it for you. * Chest pain or difficulty breathing occurs * You become worse * Fever lasts over 3 days * Fever returns after being gone for 24 hours * Fever over 103 F (39.4 C) CALL BACK IF: CARE ADVICE given per COVID-19 - DIAGNOSED OR SUSPECTED (Adult) guideline. Referrals REFERRED TO PCP OFFICE

## 2020-06-26 LAB — NOVEL CORONAVIRUS, NAA: SARS-CoV-2, NAA: NOT DETECTED

## 2020-06-28 ENCOUNTER — Encounter: Payer: Self-pay | Admitting: Internal Medicine

## 2020-06-29 ENCOUNTER — Other Ambulatory Visit: Payer: Self-pay

## 2020-06-29 ENCOUNTER — Ambulatory Visit
Admission: EM | Admit: 2020-06-29 | Discharge: 2020-06-29 | Disposition: A | Discharge status: Home / Self Care | Payer: PPO | Attending: Physician Assistant | Admitting: Physician Assistant

## 2020-06-29 DIAGNOSIS — R0981 Nasal congestion: Secondary | ICD-10-CM | POA: Insufficient documentation

## 2020-06-29 DIAGNOSIS — Z20822 Contact with and (suspected) exposure to covid-19: Secondary | ICD-10-CM | POA: Diagnosis not present

## 2020-06-29 DIAGNOSIS — J0100 Acute maxillary sinusitis, unspecified: Secondary | ICD-10-CM | POA: Diagnosis not present

## 2020-06-29 LAB — SARS CORONAVIRUS 2 (TAT 6-24 HRS): SARS Coronavirus 2: NEGATIVE

## 2020-06-29 MED ORDER — CEFDINIR 300 MG PO CAPS: 300.0000 mg | ORAL_CAPSULE | Freq: Two times a day (BID) | ORAL | 0 refills | Status: AC

## 2020-06-29 MED ORDER — FLUTICASONE PROPIONATE 50 MCG/ACT NA SUSP: 1.0000 | Freq: Two times a day (BID) | NASAL | 2 refills | Status: DC

## 2020-06-29 NOTE — ED Provider Notes (Signed)
[facility]    CSN: [id number] Arrival date & time: 06/29/20  0802      History   Chief Complaint Chief Complaint  Patient presents with  . Facial Pain    HPI Corey Bell is a 53 y.o. male.   Patient states that he has had 10-day history of sinus pain and pressure.  He says that his wife currently has Covid and is hospitalized.  Patient says that he has been sick off and on for the past month.  He has had 2 negative Covid tests.  As last negative Covid test was a week ago.  He says that he feels a lot of pain in his sinuses especially the left maxillary sinus.  He also admits to pain into his teeth and behind his left eye.  He says his ears also hurt.  He admits to nasal congestion.  He denies cough, shortness of breath.  He says that he did go to the ER last month for shortness of breath and a cough and was diagnosed with a bronchospasm.  He was treated with corticosteroids and a breathing treatment which helped.  He denies any symptoms that are similar to that at this time.  He says most of his concern is regarding his sinuses.  He denies any fever.  He admits to some fatigue.  He has been using Afrin nasal spray. Patient's medical history significant for diabetes, obstructive sleep apnea, hypertension, COPD.  Patient does not have a daily inhaler for COPD, he just uses albuterol as needed.  He has not had to use it recently.  There are no other concerns today.  HPI  Past Medical History:  Diagnosis Date  . Anxiety   . Aphthous ulcer 10-04-12   improving  . Arthritis    left index fnger  . Carpal tunnel syndrome, bilateral    damaged nerve in neck  . Chronic pain syndrome   . COPD (chronic obstructive pulmonary disease) (HCC)    mild  . Depression   . Diabetes mellitus without complication ([place])    type 2 , has not been medicated in over 1 year, no meds since Gastric sleeve  . Diverticulitis of colon   . Esophageal reflux   . H/O acute prostatitis   .  Hypothyroidism   . Insomnia, unspecified   . Mixed hyperlipidemia   . Peyronie disease 12-20-12   PER uROLOGY  . Seasonal allergies   . Sleep apnea    uses cpap  . Tympanic membrane perforation    history of    Patient Active Problem List   Diagnosis Date Noted  . BPH (benign prostatic hyperplasia) 03/02/2020  . Iron deficiency anemia 03/02/2020  . Adjustment reaction with anxiety and depression 03/06/2019  . Post-traumatic stress disorder, acute 01/27/2019  . Diabetes mellitus type 2, diet-controlled ([place]) 07/31/2017  . GERD (gastroesophageal reflux disease) 04/07/2015  . HLD (hyperlipidemia) 06/03/2014  . DDD (degenerative disc disease), cervical 10/13/2013  . DDD (degenerative disc disease), lumbosacral 10/13/2013  . OSA (obstructive sleep apnea) 07/01/2013  . Essential hypertension 05/29/2013  . Hypothyroidism   . COPD (chronic obstructive pulmonary disease) ([place]) 01/07/2013    Past Surgical History:  Procedure Laterality Date  . LAPAROSCOPIC GASTRIC SLEEVE RESECTION N/A 07/31/2017   Procedure: LAPAROSCOPIC GASTRIC SLEEVE RESECTION WITH UPPER ENDOSCOPY;  Surgeon: Alphonsa Overall, MD;  Location: [facility];  Service: General;  Laterality: N/A;  . LumbarSacral Disc Surgery     L5-S1  . MENISCUS REPAIR Left  torn meniscus  . NESBIT PROCEDURE N/A 08/22/2013   Procedure: 16 DOT PLACTATION;  Surgeon: Claybon Jabs, MD;  Location: [facility];  Service: Urology;  Laterality: N/A;  . TYMPANOSTOMY TUBE PLACEMENT  1975  . ULNAR COLLATERAL LIGAMENT REPAIR Right 04/17/2019   Procedure: RIGHT THUMB ULNAR COLLATERAL LIGAMENT REPAIR;  Surgeon: Leanora Cover, MD;  Location: [place];  Service: Orthopedics;  Laterality: Right;  . ULNAR COLLATERAL LIGAMENT REPAIR Right 10/16/2019   Procedure: RIGHT THUMB ULNAR COLLATERAL LIGAMENT RECONSTRUCTION;  Surgeon: Leanora Cover, MD;  Location: [place];  Service: Orthopedics;  Laterality: Right;  block in  preop  . VASECTOMY Bilateral 08/22/2013   Procedure: VASECTOMY;  Surgeon: Claybon Jabs, MD;  Location: [facility];  Service: Urology;  Laterality: Bilateral;       Home Medications    Prior to Admission medications   Medication Sig Start Date End Date Taking? Authorizing Provider  albuterol (VENTOLIN HFA) 108 (90 Base) MCG/ACT inhaler Inhale 2 puffs into the lungs every 6 (six) hours as needed for wheezing or shortness of breath. 06/02/20  Yes Lavonia Drafts, MD  aspirin 325 MG tablet Take 325 mg by mouth as needed.   Yes [provider]  buPROPion (WELLBUTRIN XL) 300 MG 24 hr tablet Take 1 tablet (300 mg total) by mouth daily. 11/04/19  Yes Lucille Passy, MD  calcium carbonate (TUMS - DOSED IN MG ELEMENTAL CALCIUM) 500 MG chewable tablet Chew 3 tablets by mouth daily.   Yes [provider]  citalopram (CELEXA) 40 MG tablet Take 1 tablet (40 mg total) by mouth daily. 11/04/19  Yes Lucille Passy, MD  cyclobenzaprine (FLEXERIL) 10 MG tablet Take 1 tablet (10 mg total) by mouth at bedtime. 01/23/20  Yes Jearld Fenton, NP  ezetimibe (ZETIA) 10 MG tablet Take 1 tablet (10 mg total) by mouth daily. 03/02/20  Yes Baity, Coralie Keens, NP  Ferrous Sulfate (IRON) 325 (65 Fe) MG TABS Take by mouth daily.    Yes [provider]  fexofenadine (ALLEGRA) 180 MG tablet Take 180 mg by mouth daily.   Yes [provider]  gabapentin (NEURONTIN) 800 MG tablet TAKE 1 TABLET BY MOUTH 4 TIMES A DAY 05/03/20  Yes Baity, Coralie Keens, NP  levothyroxine (SYNTHROID) 75 MCG tablet Take 1 tablet (75 mcg total) by mouth daily. 11/04/19  Yes Lucille Passy, MD  methocarbamol (ROBAXIN) 500 MG tablet TAKE 2 TABLETS BY MOUTH EVERY 6 HOURS ASNEEDED FOR MUSCLE SPASMS 10/20/19  Yes Lucille Passy, MD  montelukast (SINGULAIR) 10 MG tablet Take 1 tablet (10 mg total) by mouth at bedtime. 11/04/19  Yes Lucille Passy, MD  Saw [place] 450 MG CAPS Take 4 capsules by mouth daily.    Yes [provider]  sodium chloride (OCEAN) 0.65 % SOLN nasal spray Place 1 spray into both nostrils as needed for congestion. 06/17/20  Yes Montine Circle, PA-C  tamsulosin (FLOMAX) 0.4 MG CAPS capsule Take 1 capsule (0.4 mg total) by mouth 2 (two) times daily. 11/04/19  Yes Lucille Passy, MD  Thiamine HCl (VITAMIN B-1) 250 MG tablet Take 1 tablet by mouth daily.   Yes [provider]  traMADol (ULTRAM) 50 MG tablet TAKE 1 TABLET BY MOUTH EVERY 8 HOURS AS NEEDED FOR MODERATE TO SEVEREPAIN 10/20/19  Yes Lucille Passy, MD  Trolamine Salicylate (ASPERCREME EX) Apply 1 application topically as needed (for pain).   Yes [provider]  vitamin B-12 (CYANOCOBALAMIN) 1000 MCG tablet Take 1 tablet by mouth daily.   Yes [provider]  acetaminophen (TYLENOL) 500 MG tablet Take 500 mg by mouth every 6 (six) hours as needed.    [provider]  cefdinir (OMNICEF) 300 MG capsule Take 1 capsule (300 mg total) by mouth 2 (two) times daily for 10 days. 06/29/20 07/09/20  Laurene Footman B, PA-C  celecoxib (CELEBREX) 200 MG capsule Take 1 capsule (200 mg total) by mouth daily. 01/23/20   Jearld Fenton, NP  diphenhydrAMINE (BENADRYL) 25 mg capsule Take 50 mg by mouth at bedtime.    [provider]  doxycycline (VIBRAMYCIN) 100 MG capsule Take 1 capsule (100 mg total) by mouth 2 (two) times daily. 05/27/20   Sharion Balloon, NP  fluticasone (FLONASE) 50 MCG/ACT nasal spray Place 1 spray into both nostrils 2 (two) times daily. 06/29/20   Danton Clap, PA-C  pantoprazole (PROTONIX) 40 MG tablet Take 1 tablet (40 mg total) by mouth daily. 11/04/19   Lucille Passy, MD  Polyvinyl Alcohol (LIQUID TEARS OP) Place 1 drop into both eyes as needed (for dry eyes).    [provider]  predniSONE (DELTASONE) 50 MG tablet Take 1 tablet (50 mg total) by mouth daily with breakfast. 06/02/20   Lavonia Drafts, MD  gabapentin (NEURONTIN) 800 MG tablet  10/28/19   [provider]     Family History Family History  Problem Relation Age of Onset  . Congestive Heart Failure Father   . Heart disease Father   . COPD Father   . Kidney cancer Paternal Grandfather   . Heart disease Brother        stents at 37 yo  . Bladder Cancer Paternal Uncle   . Lupus Brother   . Heart failure Paternal Grandmother   . Parkinsonism Neg Hx     Social History Social History   Tobacco Use  . Smoking status: Former Smoker    Packs/day: 1.50    Years: 20.00    Pack years: 30.00    Types: Cigarettes    Quit date: 10/09/2006    Years since quitting: 13.7  . Smokeless tobacco: Never Used  Vaping Use  . Vaping Use: Never used  Substance Use Topics  . Alcohol use: Yes    Alcohol/week: 1.0 standard drink    Types: 1 Cans of beer per week    Comment: rarely  . Drug use: No     Allergies   Augmentin [amoxicillin-pot clavulanate], Demerol [meperidine], Eggs or egg-derived products, Sulfamethoxazole, Statins, Sulfa antibiotics, and Sulfacetamide sodium   Review of Systems Review of Systems  Constitutional: Positive for fatigue. Negative for chills, diaphoresis and fever.  HENT: Positive for congestion, rhinorrhea, sinus pressure, sinus pain and sore throat.   Respiratory: Positive for cough. Negative for shortness of breath and wheezing.   Cardiovascular: Negative for chest pain and palpitations.  Gastrointestinal: Negative for abdominal pain, nausea and vomiting.  Musculoskeletal: Negative for myalgias.  Skin: Negative for rash.  Neurological: Positive for headaches. Negative for weakness.     Physical Exam Triage Vital Signs ED Triage Vitals  Enc Vitals Group     BP      Pulse      Resp      Temp      Temp src      SpO2      Weight      Height      Head Circumference  Peak Flow      Pain Score      Pain Loc      Pain Edu?      Excl. in [place]?    No data found.  Updated Vital Signs BP 126/74 (BP Location: Left Arm)   Pulse (!) 58   Temp 98.1 F  (36.7 C) (Oral)   Resp 17   Ht 6' (1.829 m)   Wt 250 lb (113.4 kg)   SpO2 100%   BMI 33.91 kg/m       Physical Exam Vitals and nursing note reviewed.  Constitutional:      General: He is not in acute distress.    Appearance: Normal appearance. He is well-developed. He is not ill-appearing or toxic-appearing.  HENT:     Head: Normocephalic and atraumatic.     Right Ear: A middle ear effusion is present. Tympanic membrane is injected and bulging.     Left Ear: A middle ear effusion is present. Tympanic membrane is injected and retracted.     Nose: Congestion and rhinorrhea present.     Left Sinus: Maxillary sinus tenderness present.     Mouth/Throat:     Mouth: Mucous membranes are moist.     Pharynx: Oropharynx is clear. Posterior oropharyngeal erythema present.  Eyes:     General: No scleral icterus.    Extraocular Movements: Extraocular movements intact.     Conjunctiva/sclera: Conjunctivae normal.     Pupils: Pupils are equal, round, and reactive to light.  Cardiovascular:     Rate and Rhythm: Normal rate and regular rhythm.     Heart sounds: Normal heart sounds. No murmur heard.   Pulmonary:     Effort: Pulmonary effort is normal. No respiratory distress.     Breath sounds: Normal breath sounds.  Musculoskeletal:     Cervical back: Normal range of motion and neck supple.  Skin:    General: Skin is warm and dry.     Findings: No rash.  Neurological:     General: No focal deficit present.     Mental Status: He is alert. Mental status is at baseline.     Motor: No weakness.     Coordination: Coordination normal.     Gait: Gait normal.  Psychiatric:        Mood and Affect: Mood normal.        Behavior: Behavior normal.        Thought Content: Thought content normal.      UC Treatments / Results  Labs (all labs ordered are listed, but only abnormal results are displayed) Labs Reviewed  SARS CORONAVIRUS 2 (TAT 6-24 HRS)    EKG   Radiology No results  found.  Procedures Procedures (including critical care time)  Medications Ordered in UC Medications - No data to display  Initial Impression / Assessment and Plan / UC Course  I have reviewed the triage vital signs and the nursing notes.  Pertinent labs & imaging results that were available during my care of the patient were reviewed by me and considered in my medical decision making (see chart for details).   Patient presenting for 10-day history of worsening sinus pain.  He was previously treated a month ago with doxycycline and said that did not help.  He says this has seemed to worsen over the past week and a half.  He has been using nasal spray without relief.  He has been exposed to Covid through his wife.  He has had  2 - Covid test with the last negative Covid test last week.  He agrees to have another Covid test.  Advised him that if this test is positive he does not really need to isolate.  Suspect secondary bacterial sinus infection that was not susceptible to doxycycline.  Patient has Augmentin listed as an allergy, but says he only had diarrhea one time with it.  I have sent cefdinir at this time and also Flonase.  Advised him to follow-up with PCP if not feeling better.  If he has any fever, shortness of breath or chest pain he is to go to the emergency room.  Also, he should follow-up with PCP about his COPD as he may need a daily inhaler.   Final Clinical Impressions(s) / UC Diagnoses   Final diagnoses:  Acute maxillary sinusitis, recurrence not specified  Nasal congestion  Exposure to COVID-19 virus     Discharge Instructions     SINUSITIS: Reviewed use of a nasal saline irrigation system. May consider use of intranasal steroids, such as Flonase as well. Use medications as directed. If antibiotics are prescribed, take the full course of antibiotics. Also consider use of Sudafed or Mucinex D. Increase rest, fluids. If you do not improve or if you worsen after a course of  antibiotics, you should be re-examined. You may need a different antibiotic or further evaluation with imaging or an exam of the inside of the sinuses  You have received COVID testing today either for positive exposure, concerning symptoms that could be related to COVID infection, screening purposes, or re-testing after confirmed positive.  Your test obtained today checks for active viral infection in the last 1-2 weeks. If your test is negative now, you can still test positive later. So, if you do develop symptoms you should either get re-tested and/or isolate x 10 days. Please follow CDC guidelines.  While Rapid antigen tests come back in 15-20 minutes, send out PCR/molecular test results typically come back within 24 hours. In the mean time, if you are symptomatic, assume this could be a positive test and treat/monitor yourself as if you do have COVID.   We will call with test results. Please download the MyChart app and set up a profile to access test results.   If symptomatic, go home and rest. Push fluids. Take Tylenol as needed for discomfort. Gargle warm salt water. Throat lozenges. Take Mucinex DM or Robitussin for cough. Humidifier in bedroom to ease coughing. Warm showers. Also review the COVID handout for more information.  COVID-19 INFECTION: The incubation period of COVID-19 is approximately 14 days after exposure, with most symptoms developing in roughly 4-5 days. Symptoms may range in severity from mild to critically severe. Roughly 80% of those infected will have mild symptoms. People of any age may become infected with COVID-19 and have the ability to transmit the virus. The most common symptoms include: fever, fatigue, cough, body aches, headaches, sore throat, nasal congestion, shortness of breath, nausea, vomiting, diarrhea, changes in smell and/or taste.    COURSE OF ILLNESS Some patients may begin with mild disease which can progress quickly into critical symptoms. If your  symptoms are worsening please call ahead to the Emergency Department and proceed there for further treatment. Recovery time appears to be roughly 1-2 weeks for mild symptoms and 3-6 weeks for severe disease.   GO IMMEDIATELY TO ER FOR FEVER YOU ARE UNABLE TO GET DOWN WITH TYLENOL, BREATHING PROBLEMS, CHEST PAIN, FATIGUE, LETHARGY, INABILITY TO EAT OR DRINK, ETC  QUARANTINE AND ISOLATION: To help decrease the spread of COVID-19 please remain isolated if you have COVID infection or are highly suspected to have COVID infection. This means -stay home and isolate to one room in the home if you live with others. Do not share a bed or bathroom with others while ill, sanitize and wipe down all countertops and keep common areas clean and disinfected. You may discontinue isolation if you have a mild case and are asymptomatic 10 days after symptom onset as long as you have been fever free >24 hours without having to take Motrin or Tylenol. If your case is more severe (meaning you develop pneumonia or are admitted in the hospital), you may have to isolate longer.   If you have been in close contact (within 6 feet) of someone diagnosed with COVID 19, you are advised to quarantine in your home for 14 days as symptoms can develop anywhere from 2-14 days after exposure to the virus. If you develop symptoms, you  must isolate.  Most current guidelines for COVID after exposure -isolate 10 days if you ARE NOT tested for COVID as long as symptoms do not develop -isolate 7 days if you are tested and remain asymptomatic -You do not necessarily need to be tested for COVID if you have + exposure and        develop   symptoms. Just isolate at home x10 days from symptom onset During this global pandemic, CDC advises to practice social distancing, try to stay at least 23ft away from others at all times. Wear a face covering. Wash and sanitize your hands regularly and avoid going anywhere that is not necessary.  KEEP IN MIND THAT  THE COVID TEST IS NOT 100% ACCURATE AND YOU SHOULD STILL DO EVERYTHING TO PREVENT POTENTIAL SPREAD OF VIRUS TO OTHERS (WEAR MASK, WEAR GLOVES, [place] HANDS AND SANITIZE REGULARLY). IF INITIAL TEST IS NEGATIVE, THIS MAY NOT MEAN YOU ARE DEFINITELY NEGATIVE. MOST ACCURATE TESTING IS DONE 5-7 DAYS AFTER EXPOSURE.   It is not advised by CDC to get re-tested after receiving a positive COVID test since you can still test positive for weeks to months after you have already cleared the virus.   *If you have not been vaccinated for COVID, I strongly suggest you consider getting vaccinated as long as there are no contraindications.      ED Prescriptions    Medication Sig Dispense Auth. Provider   cefdinir (OMNICEF) 300 MG capsule Take 1 capsule (300 mg total) by mouth 2 (two) times daily for 10 days. 20 capsule Laurene Footman B, PA-C   fluticasone (FLONASE) 50 MCG/ACT nasal spray Place 1 spray into both nostrils 2 (two) times daily. 1 g Danton Clap, PA-C     PDMP not reviewed this encounter.   Danton Clap, PA-C 06/29/20 [phone]

## 2020-06-29 NOTE — Discharge Instructions (Addendum)
SINUSITIS: Reviewed use of a nasal saline irrigation system. May consider use of intranasal steroids, such as Flonase as well. Use medications as directed. If antibiotics are prescribed, take the full course of antibiotics. Also consider use of Sudafed or Mucinex D. Increase rest, fluids. If you do not improve or if you worsen after a course of antibiotics, you should be re-examined. You may need a different antibiotic or further evaluation with imaging or an exam of the inside of the sinuses  You have received COVID testing today either for positive exposure, concerning symptoms that could be related to COVID infection, screening purposes, or re-testing after confirmed positive.  Your test obtained today checks for active viral infection in the last 1-2 weeks. If your test is negative now, you can still test positive later. So, if you do develop symptoms you should either get re-tested and/or isolate x 10 days. Please follow CDC guidelines.  While Rapid antigen tests come back in 15-20 minutes, send out PCR/molecular test results typically come back within 24 hours. In the mean time, if you are symptomatic, assume this could be a positive test and treat/monitor yourself as if you do have COVID.   We will call with test results. Please download the MyChart app and set up a profile to access test results.   If symptomatic, go home and rest. Push fluids. Take Tylenol as needed for discomfort. Gargle warm salt water. Throat lozenges. Take Mucinex DM or Robitussin for cough. Humidifier in bedroom to ease coughing. Warm showers. Also review the COVID handout for more information.  COVID-19 INFECTION: The incubation period of COVID-19 is approximately 14 days after exposure, with most symptoms developing in roughly 4-5 days. Symptoms may range in severity from mild to critically severe. Roughly 80% of those infected will have mild symptoms. People of any age may become infected with COVID-19 and have the  ability to transmit the virus. The most common symptoms include: fever, fatigue, cough, body aches, headaches, sore throat, nasal congestion, shortness of breath, nausea, vomiting, diarrhea, changes in smell and/or taste.    COURSE OF ILLNESS Some patients may begin with mild disease which can progress quickly into critical symptoms. If your symptoms are worsening please call ahead to the Emergency Department and proceed there for further treatment. Recovery time appears to be roughly 1-2 weeks for mild symptoms and 3-6 weeks for severe disease.   GO IMMEDIATELY TO ER FOR FEVER YOU ARE UNABLE TO GET DOWN WITH TYLENOL, BREATHING PROBLEMS, CHEST PAIN, FATIGUE, LETHARGY, INABILITY TO EAT OR DRINK, ETC  QUARANTINE AND ISOLATION: To help decrease the spread of COVID-19 please remain isolated if you have COVID infection or are highly suspected to have COVID infection. This means -stay home and isolate to one room in the home if you live with others. Do not share a bed or bathroom with others while ill, sanitize and wipe down all countertops and keep common areas clean and disinfected. You may discontinue isolation if you have a mild case and are asymptomatic 10 days after symptom onset as long as you have been fever free >24 hours without having to take Motrin or Tylenol. If your case is more severe (meaning you develop pneumonia or are admitted in the hospital), you may have to isolate longer.   If you have been in close contact (within 6 feet) of someone diagnosed with COVID 19, you are advised to quarantine in your home for 14 days as symptoms can develop anywhere from 2-14 days after  exposure to the virus. If you develop symptoms, you  must isolate.  Most current guidelines for COVID after exposure -isolate 10 days if you ARE NOT tested for COVID as long as symptoms do not develop -isolate 7 days if you are tested and remain asymptomatic -You do not necessarily need to be tested for COVID if you have +  exposure and        develop   symptoms. Just isolate at home x10 days from symptom onset During this global pandemic, CDC advises to practice social distancing, try to stay at least 52ft away from others at all times. Wear a face covering. Wash and sanitize your hands regularly and avoid going anywhere that is not necessary.  KEEP IN MIND THAT THE COVID TEST IS NOT 100% ACCURATE AND YOU SHOULD STILL DO EVERYTHING TO PREVENT POTENTIAL SPREAD OF VIRUS TO OTHERS (WEAR MASK, WEAR GLOVES, [place] HANDS AND SANITIZE REGULARLY). IF INITIAL TEST IS NEGATIVE, THIS MAY NOT MEAN YOU ARE DEFINITELY NEGATIVE. MOST ACCURATE TESTING IS DONE 5-7 DAYS AFTER EXPOSURE.   It is not advised by CDC to get re-tested after receiving a positive COVID test since you can still test positive for weeks to months after you have already cleared the virus.   *If you have not been vaccinated for COVID, I strongly suggest you consider getting vaccinated as long as there are no contraindications.

## 2020-06-29 NOTE — ED Triage Notes (Signed)
Patient states that he has been having sinus pain and pressure x 10 days. States that his wife is hospitalized with covid. He has tested negative several times since onset of symptoms.

## 2020-06-30 ENCOUNTER — Telehealth: Payer: PPO | Admitting: Internal Medicine

## 2020-07-05 ENCOUNTER — Encounter: Payer: Self-pay | Admitting: Internal Medicine

## 2020-07-05 NOTE — Telephone Encounter (Signed)
Spoke to pt and he is okay with finishing current treatment Ax with Flonase and allergy medicine will update--- is aware of current guidelines and expressed understanding

## 2020-07-27 ENCOUNTER — Telehealth: Payer: Self-pay

## 2020-07-27 NOTE — Telephone Encounter (Signed)
Received msg that pt was trying to make apt with PCP and was upset due to not being able to come due to symptoms he was having. Pt has had covid vaccines and has tested neg for covid several times. Pt reported he has COPD. This nurse was asked to f/u with the pt.  Reviewed chart and pt had contacted office office after ER visit on 9/15 after wife was diagnosed with covid and he was awaiting test results. He was offered a VV at that time but pt did not have capability for a VV so PCP would not do a phone visit so pt was told to go to the UC if he needed to be seen.  Contacted pt to get further information. Pt reports he called on 9/20 and wanted to be seen and his covid test was neg and PCP said she would see him in office since test was neg. The was msg was sent through my chart but pt went to the UC the next day for sinus infection because he did not get the msg until after he went to the UC. On 9/27 pt sent a msg that he went to UC on 9/21 with bad sinus inf and was told to f/u with PCP and had another neg covid test. He was trying to get an apt for one week when he was finished with the abx but was told he would not be seen until after he finished abx and he should f/u then. Pt is frustrated because he has a hx of COPD and his SOB is has been worsening from morning to the evening and he is not sure why. Pt denies any other covid symptoms. Pt no longer has any other sinus symptoms and is over the sinus infection. Pt only wants to f/u concerning the COPD and SOB. He has contacted [facility] today and self referred for an apt on 08/17/20 because he could not get an apt with PCP.  Pt is frustrated because he does not have the capability for a VV and PCP will not do a phone visit and he is not allowed to come into the office. Pt reports he needs to f/u concerning his COPD and need to have availability with his PCP or he will have to find another provider even though he likes his provider very much, he needs to be  able to see his provider.  Pt denies any other symptoms. Inquired if pt still wanted to see PCP since he already had apt with pulmonary and he said he would like apt. Since PCP reported she would see pt in note on 06/28/20 pt has been scheduled for OV on 06/29/20 at 815. Advised pt if SOB changed or he developed any new symptoms to contact office and if there was any msg from PCP this office would f/u. Advised pt of ER precautions. Pt appreciative and verbalized understanding.

## 2020-07-28 NOTE — Telephone Encounter (Signed)
Will see at upcoming appt 

## 2020-07-29 ENCOUNTER — Ambulatory Visit (INDEPENDENT_AMBULATORY_CARE_PROVIDER_SITE_OTHER): Payer: PPO | Admitting: Internal Medicine

## 2020-07-29 ENCOUNTER — Other Ambulatory Visit: Payer: Self-pay

## 2020-07-29 ENCOUNTER — Encounter: Payer: Self-pay | Admitting: Internal Medicine

## 2020-07-29 VITALS — BP 126/78 | HR 63 | Temp 97.3°F | Wt 258.0 lb

## 2020-07-29 DIAGNOSIS — K219 Gastro-esophageal reflux disease without esophagitis: Secondary | ICD-10-CM

## 2020-07-29 DIAGNOSIS — R0789 Other chest pain: Secondary | ICD-10-CM | POA: Diagnosis not present

## 2020-07-29 DIAGNOSIS — J449 Chronic obstructive pulmonary disease, unspecified: Secondary | ICD-10-CM | POA: Diagnosis not present

## 2020-07-29 DIAGNOSIS — Z9109 Other allergy status, other than to drugs and biological substances: Secondary | ICD-10-CM | POA: Diagnosis not present

## 2020-07-29 MED ORDER — PREDNISONE 10 MG PO TABS: ORAL_TABLET | ORAL | 0 refills | Status: DC

## 2020-07-29 NOTE — Progress Notes (Signed)
Subjective:    Patient ID: Corey Bell, male    DOB: 26-Mar-1966, 54 y.o.   MRN: [id number]  HPI  Pt presents to the clinic today with c/o left sided chest pressure. This started a few months ago. It is intermittent. It seems worse later in the day and in the evening when he lays down, but it never wakes him up at night. He denies chest pain, shortness of breath. The chest pressure does not get worse with exertion. He is a former smoker, quit in 2008. He does have COPD, managed on Albuterol as needed. He has taken Mucinex with minimal relief of symptoms. He has been on a round of abx and steroids, noted some improvement with the Prednisone. He has had multiple negative Covid tests. He does have a self referred appt with pulmonology pending. He has a history of COPD, taking Albuterol 2 x day. His allergies are managed with Allegra, Flonase, Montelukast and Ocean Nasal Spray. He has a history of GERD, managed on Pantoprazole.   Review of Systems      Past Medical History:  Diagnosis Date  . Anxiety   . Aphthous ulcer 10-04-12   improving  . Arthritis    left index fnger  . Carpal tunnel syndrome, bilateral    damaged nerve in neck  . Chronic pain syndrome   . COPD (chronic obstructive pulmonary disease) (HCC)    mild  . Depression   . Diabetes mellitus without complication ([place])    type 2 , has not been medicated in over 1 year, no meds since Gastric sleeve  . Diverticulitis of colon   . Esophageal reflux   . H/O acute prostatitis   . Hypothyroidism   . Insomnia, unspecified   . Mixed hyperlipidemia   . Peyronie disease 12-20-12   PER uROLOGY  . Seasonal allergies   . Sleep apnea    uses cpap  . Tympanic membrane perforation    history of    Current Outpatient Medications  Medication Sig Dispense Refill  . albuterol (VENTOLIN HFA) 108 (90 Base) MCG/ACT inhaler Inhale 2 puffs into the lungs every 6 (six) hours as needed for wheezing or shortness of breath. 8 g 2  .  aspirin 325 MG tablet Take 325 mg by mouth as needed.    Marland Kitchen buPROPion (WELLBUTRIN XL) 300 MG 24 hr tablet Take 1 tablet (300 mg total) by mouth daily. 90 tablet 3  . calcium carbonate (TUMS - DOSED IN MG ELEMENTAL CALCIUM) 500 MG chewable tablet Chew 3 tablets by mouth daily.    . citalopram (CELEXA) 40 MG tablet Take 1 tablet (40 mg total) by mouth daily. 90 tablet 3  . cyclobenzaprine (FLEXERIL) 10 MG tablet Take 1 tablet (10 mg total) by mouth at bedtime. 30 tablet 5  . diphenhydrAMINE (BENADRYL) 25 mg capsule Take 50 mg by mouth at bedtime.    Marland Kitchen doxycycline (VIBRAMYCIN) 100 MG capsule Take 1 capsule (100 mg total) by mouth 2 (two) times daily. 20 capsule 0  . ezetimibe (ZETIA) 10 MG tablet Take 1 tablet (10 mg total) by mouth daily. 90 tablet 3  . Ferrous Sulfate (IRON) 325 (65 Fe) MG TABS Take by mouth daily.     . fexofenadine (ALLEGRA) 180 MG tablet Take 180 mg by mouth daily.    . fluticasone (FLONASE) 50 MCG/ACT nasal spray Place 1 spray into both nostrils 2 (two) times daily. 1 g 2  . gabapentin (NEURONTIN) 800 MG tablet TAKE 1 TABLET  BY MOUTH 4 TIMES A DAY 360 tablet 0  . levothyroxine (SYNTHROID) 75 MCG tablet Take 1 tablet (75 mcg total) by mouth daily. 90 tablet 3  . methocarbamol (ROBAXIN) 500 MG tablet TAKE 2 TABLETS BY MOUTH EVERY 6 HOURS ASNEEDED FOR MUSCLE SPASMS 120 tablet 5  . montelukast (SINGULAIR) 10 MG tablet Take 1 tablet (10 mg total) by mouth at bedtime. 90 tablet 3  . pantoprazole (PROTONIX) 40 MG tablet Take 1 tablet (40 mg total) by mouth daily. 90 tablet 3  . Saw Palmetto 450 MG CAPS Take 4 capsules by mouth daily.     . sodium chloride (OCEAN) 0.65 % SOLN nasal spray Place 1 spray into both nostrils as needed for congestion. 15 mL 0  . tamsulosin (FLOMAX) 0.4 MG CAPS capsule Take 1 capsule (0.4 mg total) by mouth 2 (two) times daily. 180 capsule 3  . Thiamine HCl (VITAMIN B-1) 250 MG tablet Take 1 tablet by mouth daily.    . traMADol (ULTRAM) 50 MG tablet TAKE 1  TABLET BY MOUTH EVERY 8 HOURS AS NEEDED FOR MODERATE TO SEVEREPAIN 30 tablet 5  . Trolamine Salicylate (ASPERCREME EX) Apply 1 application topically as needed (for pain).    . vitamin B-12 (CYANOCOBALAMIN) 1000 MCG tablet Take 1 tablet by mouth daily.     No current facility-administered medications for this visit.    Allergies  Allergen Reactions  . Augmentin [Amoxicillin-Pot Clavulanate] Diarrhea  . Demerol [Meperidine] Other (See Comments)    "goes crazy and has the strength of 10 men"  . Eggs Or Egg-Derived Products Diarrhea and Nausea And Vomiting  . Sulfamethoxazole Other (See Comments)    Blisters form in mouth  . Statins Rash  . Sulfa Antibiotics Rash  . Sulfacetamide Sodium Rash    Family History  Problem Relation Age of Onset  . Congestive Heart Failure Father   . Heart disease Father   . COPD Father   . Kidney cancer Paternal Grandfather   . Heart disease Brother        stents at 27 yo  . Bladder Cancer Paternal Uncle   . Lupus Brother   . Heart failure Paternal Grandmother   . Parkinsonism Neg Hx     Social History   Socioeconomic History  . Marital status: Married    Spouse name: [name]  . Number of children: 0  . Years of education: college  . Highest education level: Not on file  Occupational History  . 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 Smoking status: Former Smoker    Packs/day: 1.50    Years: 20.00    Pack years: 30.00    Types: Cigarettes    Quit date: 10/09/2006    Years since quitting: 13.8  . Smokeless tobacco: Never Used  Vaping Use  . Vaping Use: Never used  Substance and Sexual Activity  . Alcohol use: Yes    Alcohol/week: 1.0 standard drink    Types: 1 Cans of beer per week    Comment: rarely  . Drug use: No  . Sexual activity: Yes    Birth control/protection: Surgical  Other Topics Concern  . 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 Difficulty of Paying Living Expenses: Not on file  Food Insecurity:   . Worried About Charity fundraiser in the Last Year: Not on file  . Ran Out of Food in the Last Year: Not on file  Transportation Needs:   . Lack of Transportation (Medical): Not on file  . Lack of Transportation (Non-Medical): Not on file  Physical Activity:   . Days of Exercise per Week: Not on file  . Minutes of Exercise per Session: Not on file  Stress:   . Feeling of Stress : Not on file  Social Connections:   . Frequency of Communication with Friends and Family: Not on file  . Frequency of Social Gatherings with Friends and Family: Not on file  . Attends Religious Services: Not on file  . Active Member of Clubs or Organizations: Not on file  . Attends Archivist Meetings: Not on file  . Marital Status: Not on file  Intimate Partner Violence:   . Fear of Current or Ex-Partner: Not on file  . Emotionally Abused: Not on file  . Physically Abused: Not on file  . Sexually Abused: Not on file     Constitutional: Denies fever, malaise, fatigue, headache or abrupt weight changes.  HEENT: Denies eye pain, eye redness, ear pain, ringing in the ears, wax buildup, runny nose, nasal congestion, bloody nose, or sore throat. Respiratory: Denies difficulty breathing, shortness of breath, cough or sputum production.   Cardiovascular: Pt reports chest pressure. Denies chest pain, chest tightness, palpitations or swelling in the hands or feet.    Gastrointestinal: Denies abdominal pain, bloating, constipation, diarrhea or blood in the stool.  GU: Denies urgency, frequency, pain with urination, burning sensation, blood in urine, odor or discharge. Musculoskeletal: Denies decrease in range of motion, difficulty with gait, muscle pain or joint pain and swelling.  Skin: Denies redness, rashes, lesions or ulcercations.   No other specific complaints in a complete review of systems (except as listed in HPI above).  Objective:   Physical Exam   BP 126/78   Pulse 63   Temp (!) 97.3 F (36.3 C) (Temporal)   Wt 258 lb (117 kg)   SpO2 97%   BMI 34.99 kg/m  Wt Readings from Last 3 Encounters:  07/29/20 258 lb (117 kg)  06/29/20 250 lb (113.4 kg)  06/02/20 250 lb (113.4 kg)    General: Appears his stated age, obese, in NAD. Skin: Warm, dry and intact. No rashes noted. HEENT: Head: normal shape and size; Eyes: sclera white, no icterus, conjunctiva pink, PERRLA and EOMs intact;  Cardiovascular: Normal rate and rhythm. S1,S2 noted.  No murmur, rubs or gallops noted.  Pulmonary/Chest: Normal effort and positive vesicular breath sounds. No respiratory distress. No wheezes, rales or ronchi noted.  Abdomen: Soft and nontender. Normal bowel sounds.  Musculoskeletal: Chest wall nontender with palpation.  Neurological: Alert and oriented.   Psychiatric: Mood and affect normal. Behavior is normal. Judgment and thought content normal.    BMET    Component Value Date/Time  NA 141 06/02/2020 0431   NA 142 01/27/2018 0000   NA 138 12/26/2012 1936   K 4.0 06/02/2020 0431   K 3.9 12/26/2012 1936   CL 107 06/02/2020 0431   CL 106 12/26/2012 1936   CO2 27 06/02/2020 0431   CO2 24 12/26/2012 1936   GLUCOSE 138 (H) 06/02/2020 0431   GLUCOSE 124 (H) 12/26/2012 1936   BUN 18 06/02/2020 0431   BUN 19 01/27/2018 0000   BUN 22 (H) 12/26/2012 1936   CREATININE 1.00 06/02/2020 0431   CREATININE 1.24 09/23/2015 1440   CALCIUM 9.0 06/02/2020  0431   CALCIUM 8.4 (L) 12/26/2012 1936   GFRNONAA >60 06/02/2020 0431   GFRNONAA 58 (L) 12/26/2012 1936   GFRAA >60 06/02/2020 0431   GFRAA >60 12/26/2012 1936    Lipid Panel     Component Value Date/Time   CHOL 157 06/10/2020 0919   TRIG 149.0 06/10/2020 0919   HDL 42.50 06/10/2020 0919   CHOLHDL 4 06/10/2020 0919   VLDL 29.8 06/10/2020 0919   LDLCALC 85 06/10/2020 0919    CBC    Component Value Date/Time   WBC 5.2 06/02/2020 0431   RBC 4.68 06/02/2020 0431   HGB 15.0 06/02/2020 0431   HGB 15.2 12/26/2012 1936   HCT 41.5 06/02/2020 0431   HCT 44.6 12/26/2012 1936   PLT 167 06/02/2020 0431   PLT 218 12/26/2012 1936   MCV 88.7 06/02/2020 0431   MCV 94 12/26/2012 1936   MCH 32.1 06/02/2020 0431   MCHC 36.1 (H) 06/02/2020 0431   RDW 12.6 06/02/2020 0431   RDW 13.3 12/26/2012 1936   LYMPHSABS 1.9 06/02/2020 0431   MONOABS 0.4 06/02/2020 0431   EOSABS 0.2 06/02/2020 0431   BASOSABS 0.1 06/02/2020 0431    Hgb A1C Lab Results  Component Value Date   HGBA1C 5.7 12/16/2019           Assessment & Plan:   Chest Pressure:  Chest xray, CT angio chest, ECG and labs from 8/26 ER visit reviewed Discussed CT Cardiac score- he reports he has had a negative stress in the past Advised him to keep his app with pulmonology- needs PFT's Continue current meds Discussed starting daily LABA for COPD to decrease Albuterol use but last chest xray normal so I question wether he really does have COPD RX for Pred Taper x 6 days  Return precautions discussed  Webb Silversmith, NP This visit occurred during the SARS-CoV-2 public health emergency.  Safety protocols were in place, including screening questions prior to the visit, additional usage of staff PPE, and extensive cleaning of exam room while observing appropriate contact time as indicated for disinfecting solutions.

## 2020-08-02 ENCOUNTER — Encounter: Payer: Self-pay | Admitting: Internal Medicine

## 2020-08-02 NOTE — Patient Instructions (Signed)
Angina  Angina is very bad discomfort or pain in the chest, neck, arm, jaw, or back. The discomfort is caused by a lack of blood in the middle layer of the heart wall (myocardium). What are the causes? This condition is caused by a buildup of fat and cholesterol (plaque) in your arteries (atherosclerosis). This buildup narrows the arteries and makes it hard for blood to flow. What increases the risk? You are more likely to develop this condition if:  You have high levels of cholesterol in your blood.  You have high blood pressure (hypertension).  You have diabetes.  You have a family history of heart disease.  You are not active, or you do not exercise enough.  You feel sad (depressed).  You have been treated with high energy rays (radiation) on the left side of your chest. Other risk factors are:  Using tobacco.  Being very overweight (obese).  Eating a diet high in unhealthy fats (saturated fats).  Having stress, or being exposed to things that cause stress.  Using drugs, such as cocaine. Women have a greater risk for angina if:  They are older than 55.  They have stopped having their period (are in postmenopause). What are the signs or symptoms? Common symptoms of this condition in both men and women may include:  Chest pain, which may: ? Feel like a crushing or squeezing in the chest. ? Feel like a tightness, pressure, fullness, or heaviness in the chest. ? Last for more than a few minutes at a time. ? Stop and come back (recur) after a few minutes.  Pain in the neck, arm, jaw, or back.  Heartburn or upset stomach (indigestion) for no reason.  Being short of breath.  Feeling sick to your stomach (nauseous).  Sudden cold sweats. Women and people with diabetes may have other symptoms that are not usual, such as feeling:  Tired (fatigue).  Worried or nervous (anxious) for no reason.  Weak for no reason.  Dizzy or passing out (fainting). How is this  treated? This condition may be treated with:  Medicines. These are given to: ? Prevent blood clots. ? Prevent heart attack. ? Relax blood vessels and improve blood flow to the heart (nitrates). ? Reduce blood pressure. ? Improve the pumping action of the heart. ? Reduce fat and cholesterol in the blood.  A procedure to widen a narrowed or blocked artery in the heart (angioplasty).  Surgery to allow blood to go around a blocked artery (coronary artery bypass surgery). Follow these instructions at home: Medicines  Take over-the-counter and prescription medicines only as told by your doctor.  Do not take these medicines unless your doctor says that you can: ? NSAIDs. These include:  Ibuprofen.  Naproxen. ? Vitamin supplements that have vitamin A, vitamin E, or both. ? Hormone therapy that contains estrogen with or without progestin. Eating and drinking   Eat a heart-healthy diet that includes: ? Lots of fresh fruits and vegetables. ? Whole grains. ? Low-fat (lean) protein. ? Low-fat dairy products.  Follow instructions from your doctor about what you cannot eat or drink. Activity  Follow an exercise program that your doctor tells you.  Talk with your doctor about joining a program to help improve the health of your heart (cardiac rehab).  When you feel tired, take a break. Plan breaks if you know you are going to feel tired. Lifestyle   Do not use any products that contain nicotine or tobacco. This includes cigarettes, e-cigarettes, and   chewing tobacco. If you need help quitting, ask your doctor.  If your doctor says you can drink alcohol: ? Limit how much you use to:  0-1 drink a day for women who are not pregnant.  0-2 drinks a day for men. ? Be aware of how much alcohol is in your drink. In the [place] one drink equals:  One 12 oz bottle of beer (355 mL).  One 5 oz glass of wine (148 mL).  One 1 oz glass of hard liquor (44 mL). General instructions  Stay  at a healthy weight. If your doctor tells you to do so, work with him or her to lose weight.  Learn to deal with stress. If you need help, ask your doctor.  Keep your vaccines up to date. Get a flu shot every year.  Talk with your doctor if you feel sad. Take a screening test to see if you are at risk for depression.  Work with your doctor to manage any other health problems that you have. These may include diabetes or high blood pressure.  Keep all follow-up visits as told by your doctor. This is important. Get help right away if:  You have pain in your chest, neck, arm, jaw, or back, and the pain: ? Lasts more than a few minutes. ? Comes back. ? Does not get better after you take medicine under your tongue (sublingual nitroglycerin). ? Keeps getting worse. ? Comes more often.  You have any of these problems for no reason: ? Sweating a lot. ? Heartburn or upset stomach. ? Shortness of breath. ? Trouble breathing. ? Feeling sick to your stomach. ? Throwing up (vomiting). ? Feeling more tired than normal. ? Feeling nervous or worrying more than normal. ? Weakness.  You are suddenly dizzy or light-headed.  You pass out. These symptoms may be an emergency. Do not wait to see if the symptoms will go away. Get medical help right away. Call your local emergency services (911 in the [place] Do not drive yourself to the hospital. Summary  Angina is very bad discomfort or pain in the chest, neck, arm, neck, or back.  Symptoms include chest pain, heartburn or upset stomach for no reason, and shortness of breath.  Women or people with diabetes may have symptoms that are not usual, such as feeling nervous or worried for no reason, weak for no reason, or tired.  Take all medicines only as told by your doctor.  You should eat a heart-healthy diet and follow an exercise program. This information is not intended to replace advice given to you by your health care provider. Make sure you  discuss any questions you have with your health care provider. Document Revised: 05/13/2018 Document Reviewed: 05/13/2018 Elsevier Patient Education  2020 [employer]  

## 2020-08-05 ENCOUNTER — Other Ambulatory Visit: Payer: Self-pay | Admitting: Internal Medicine

## 2020-08-16 ENCOUNTER — Institutional Professional Consult (permissible substitution): Payer: PPO | Admitting: Pulmonary Disease

## 2020-08-17 ENCOUNTER — Encounter: Payer: Self-pay | Admitting: Pulmonary Disease

## 2020-08-17 ENCOUNTER — Ambulatory Visit: Payer: PPO | Admitting: Pulmonary Disease

## 2020-08-17 ENCOUNTER — Other Ambulatory Visit: Payer: Self-pay

## 2020-08-17 VITALS — BP 138/90 | HR 56 | Temp 97.1°F | Ht 74.0 in | Wt 257.6 lb

## 2020-08-17 DIAGNOSIS — Z9989 Dependence on other enabling machines and devices: Secondary | ICD-10-CM | POA: Diagnosis not present

## 2020-08-17 DIAGNOSIS — Z87891 Personal history of nicotine dependence: Secondary | ICD-10-CM

## 2020-08-17 DIAGNOSIS — G4733 Obstructive sleep apnea (adult) (pediatric): Secondary | ICD-10-CM | POA: Diagnosis not present

## 2020-08-17 DIAGNOSIS — J449 Chronic obstructive pulmonary disease, unspecified: Secondary | ICD-10-CM

## 2020-08-17 DIAGNOSIS — R0602 Shortness of breath: Secondary | ICD-10-CM

## 2020-08-17 MED ORDER — BREO ELLIPTA 100-25 MCG/INH IN AEPB
1.0000 | INHALATION_SPRAY | Freq: Every day | RESPIRATORY_TRACT | 6 refills | Status: DC
Start: 2020-08-17 — End: 2021-02-22

## 2020-08-17 NOTE — Patient Instructions (Signed)
We will get breathing tests.  We are giving her a trial of Breo Ellipta 1 inhalation daily.  We will see you in follow-up in 6 to 8 weeks time call sooner should any new difficulties arise.

## 2020-08-17 NOTE — Progress Notes (Signed)
Subjective:    Patient ID: Corey Bell, male    DOB: 1966-02-24, 54 y.o.   MRN: [id number]  HPI The patient is a 54 year old former smoker (quit 2008, 40 pack years) who presents for evaluation of shortness of breath and feeling of pressure on his left chest.  He is self-referred.  His primary care provider is Webb Silversmith, NP.  He states that he has had difficulties for approximately 2 months now.  He is like his left lung does not expand fully.  He has been having orthopnea as well.  [place] room on 26 August with the same complaints.  He had a nebulization treatment which helped him some.  He had a chest x-ray and CT angio chest that were unremarkable.  These have been independently reviewed.  Also notes that he has received steroid taper packs and these have helped as well.  He has had dyspnea on exertion.  He has been given a diagnosis of COPD approximately 20 years ago but never placed on any inhalers at that time.  He has not had any fevers, chills or sweats.  He does have cough productive of thick tenacious sputum which is hard for him to bring up.  He has had a diagnosis of obstructive apnea since 2014 and requires CPAP at 15 cm of water pressure.  This has been followed up by primary care.  He has no military history.  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  He has 2 cats that are inside and 1 dog that resides outside the home.   Review of Systems A 10 point review of systems was performed and it is as noted above otherwise negative.  Past Medical History:  Diagnosis Date  . Anxiety   . Aphthous ulcer 10-04-12   improving  . Arthritis    left index fnger  . Carpal tunnel syndrome, bilateral    damaged nerve in neck  . Chronic pain syndrome   . COPD (chronic obstructive pulmonary disease) (HCC)    mild  . Depression   . Diabetes mellitus without complication ([place])    type 2 , has not been medicated in over 1  year, no meds since Gastric sleeve  . Diverticulitis of colon   . Esophageal reflux   . H/O acute prostatitis   . Hypothyroidism   . Insomnia, unspecified   . Mixed hyperlipidemia   . Peyronie disease 12-20-12   PER uROLOGY  . Seasonal allergies   . Sleep apnea    uses cpap  . Tympanic membrane perforation    history of   Past Surgical History:  Procedure Laterality Date  . LAPAROSCOPIC GASTRIC SLEEVE RESECTION N/A 07/31/2017   Procedure: LAPAROSCOPIC GASTRIC SLEEVE RESECTION WITH UPPER ENDOSCOPY;  Surgeon: Alphonsa Overall, MD;  Location: [facility];  Service: General;  Laterality: N/A;  . LumbarSacral Disc Surgery     L5-S1  . MENISCUS REPAIR Left    torn meniscus  . NESBIT PROCEDURE N/A 08/22/2013   Procedure: 16 DOT PLACTATION;  Surgeon: Claybon Jabs, MD;  Location: [facility];  Service: Urology;  Laterality: N/A;  . TYMPANOSTOMY TUBE PLACEMENT  1975  . ULNAR COLLATERAL LIGAMENT REPAIR Right 04/17/2019   Procedure: RIGHT THUMB ULNAR COLLATERAL LIGAMENT REPAIR;  Surgeon: Leanora Cover, MD;  Location: [address];  Service: Orthopedics;  Laterality: Right;  . ULNAR COLLATERAL LIGAMENT REPAIR Right 10/16/2019   Procedure: RIGHT THUMB ULNAR  COLLATERAL LIGAMENT RECONSTRUCTION;  Surgeon: Leanora Cover, MD;  Location: [facility];  Service: Orthopedics;  Laterality: Right;  block in preop  . VASECTOMY Bilateral 08/22/2013   Procedure: VASECTOMY;  Surgeon: Claybon Jabs, MD;  Location: [facility];  Service: Urology;  Laterality: Bilateral;   Family History  Problem Relation Age of Onset  . Congestive Heart Failure Father   . Heart disease Father   . COPD Father   . Kidney cancer Paternal Grandfather   . Heart disease Brother        stents at 71 yo  . Bladder Cancer Paternal Uncle   . Lupus Brother   . Heart failure Paternal Grandmother   . Parkinsonism Neg Hx    Social History   Tobacco Use  . Smoking status: Former Smoker     Packs/day: 2.00    Years: 20.00    Pack years: 40.00    Types: Cigarettes    Quit date: 10/09/2006    Years since quitting: 13.8  . Smokeless tobacco: Never Used  Substance Use Topics  . Alcohol use: Yes    Alcohol/week: 1.0 standard drink    Types: 1 Cans of beer per week    Comment: rarely   Allergies  Allergen Reactions  . Augmentin [Amoxicillin-Pot Clavulanate] Diarrhea  . Demerol [Meperidine] Other (See Comments)    "goes crazy and has the strength of 10 men"  . Eggs Or Egg-Derived Products Diarrhea and Nausea And Vomiting  . Sulfamethoxazole Other (See Comments)    Blisters form in mouth  . Statins Rash  . Sulfa Antibiotics Rash  . Sulfacetamide Sodium Rash   Current Meds  Medication Sig  . albuterol (VENTOLIN HFA) 108 (90 Base) MCG/ACT inhaler Inhale 2 puffs into the lungs every 6 (six) hours as needed for wheezing or shortness of breath.  Marland Kitchen aspirin 325 MG tablet Take 325 mg by mouth as needed.  Marland Kitchen buPROPion (WELLBUTRIN XL) 300 MG 24 hr tablet Take 1 tablet (300 mg total) by mouth daily.  . calcium carbonate (TUMS - DOSED IN MG ELEMENTAL CALCIUM) 500 MG chewable tablet Chew 3 tablets by mouth daily.  . citalopram (CELEXA) 40 MG tablet Take 1 tablet (40 mg total) by mouth daily.  . cyclobenzaprine (FLEXERIL) 10 MG tablet Take 1 tablet (10 mg total) by mouth at bedtime.  . diphenhydrAMINE (BENADRYL) 25 mg capsule Take 50 mg by mouth at bedtime.  Marland Kitchen doxycycline (VIBRAMYCIN) 100 MG capsule Take 1 capsule (100 mg total) by mouth 2 (two) times daily.  Marland Kitchen ezetimibe (ZETIA) 10 MG tablet Take 1 tablet (10 mg total) by mouth daily.  . Ferrous Sulfate (IRON) 325 (65 Fe) MG TABS Take by mouth daily.   . fexofenadine (ALLEGRA) 180 MG tablet Take 180 mg by mouth daily.  . fluticasone (FLONASE) 50 MCG/ACT nasal spray Place 1 spray into both nostrils 2 (two) times daily.  Marland Kitchen gabapentin (NEURONTIN) 800 MG tablet TAKE 1 TABLET BY MOUTH 4 TIMES A DAY  . levothyroxine (SYNTHROID) 75 MCG  tablet Take 1 tablet (75 mcg total) by mouth daily.  . methocarbamol (ROBAXIN) 500 MG tablet TAKE 2 TABLETS BY MOUTH EVERY 6 HOURS ASNEEDED FOR MUSCLE SPASMS  . montelukast (SINGULAIR) 10 MG tablet Take 1 tablet (10 mg total) by mouth at bedtime.  . pantoprazole (PROTONIX) 40 MG tablet Take 1 tablet (40 mg total) by mouth daily.  . Saw Palmetto 450 MG CAPS Take 4 capsules by mouth daily.   . sodium  chloride (OCEAN) 0.65 % SOLN nasal spray Place 1 spray into both nostrils as needed for congestion.  . tamsulosin (FLOMAX) 0.4 MG CAPS capsule Take 1 capsule (0.4 mg total) by mouth 2 (two) times daily.  . Thiamine HCl (VITAMIN B-1) 250 MG tablet Take 1 tablet by mouth daily.  . traMADol (ULTRAM) 50 MG tablet TAKE 1 TABLET BY MOUTH EVERY 8 HOURS AS NEEDED FOR MODERATE TO SEVEREPAIN  . Trolamine Salicylate (ASPERCREME EX) Apply 1 application topically as needed (for pain).  . vitamin B-12 (CYANOCOBALAMIN) 1000 MCG tablet Take 1 tablet by mouth daily.   Immunization History  Administered Date(s) Administered  . Moderna SARS-COVID-2 Vaccination 01/26/2020, 02/23/2020  . Pneumococcal Polysaccharide-23 06/10/2020  . Tdap 12/13/2017  . Zoster Recombinat (Shingrix) 03/21/2018, 05/21/2018      Objective:   Physical Exam BP 138/90 (BP Location: Left Arm, Patient Position: Sitting, Cuff Size: Normal)   Pulse (!) 56   Temp (!) 97.1 F (36.2 C) (Temporal)   Ht 6\' 2"  (1.88 m)   Wt 257 lb 9.6 oz (116.8 kg)   SpO2 98%   BMI 33.07 kg/m  GENERAL: Obese gentleman, no acute distress, fully ambulatory.  No conversational dyspnea HEAD: Normocephalic, atraumatic.  EYES: Pupils equal, round, reactive to light.  No scleral icterus.  MOUTH: Nose/mouth/throat not examined due to masking requirements for COVID 19. NECK: Supple. No thyromegaly. Trachea midline. No JVD.  No adenopathy. PULMONARY: Good air entry bilaterally.  No adventitious sounds. CARDIOVASCULAR: S1 and S2. Regular rate and rhythm.  No rubs,  murmurs or gallops heard.   ABDOMEN: Obese otherwise benign. MUSCULOSKELETAL: No joint deformity, no clubbing, no edema.  NEUROLOGIC: No focal deficit, no gait disturbance, speech is fluent. SKIN: Intact,warm,dry. PSYCH: Mood and behavior normal.       Assessment & Plan:     ICD-10-CM   1. COPD suggested by initial evaluation ([place])  J44.9    Will obtain PFTs Trial of Breo Ellipta Follow-up in 6 to 8 weeks time  2. SOB (shortness of breath)  R06.02 Pulmonary Function Test [facility] Only   Await PFTs May need cardiac work-up with PFTs inconclusive  3. Former cigarette smoker  Z87.891    Has not relapsed  4. OSA on CPAP  G47.33    Z99.89    This issue adds complexity to his management Followed by primary care   Orders Placed This Encounter  Procedures  . Pulmonary Function Test [facility] Only    Next available    Standing Status:   Future    Standing Expiration Date:   08/17/2021    Order Specific Question:   Full PFT: includes the following: basic spirometry, spirometry pre & post bronchodilator, diffusion capacity (DLCO), lung volumes    Answer:   Full PFT   Meds ordered this encounter  Medications  . fluticasone furoate-vilanterol (BREO ELLIPTA) 100-25 MCG/INH AEPB    Sig: Inhale 1 puff into the lungs daily.    Dispense:  30 each    Refill:  6   Suspect that the patient has COPD likely asthma/COPD overlap.  He will need PFTs to better determine.  We will see him in follow-up in 6 to 8 weeks time he is to contact us prior to that time should any new difficulties arise.  Renold Don, MD [facility]   *This note was dictated using voice recognition software/Dragon.  Despite best efforts to proofread, errors can occur which can change the meaning.  Any change was purely unintentional.

## 2020-09-03 DIAGNOSIS — R0602 Shortness of breath: Secondary | ICD-10-CM

## 2020-09-14 ENCOUNTER — Telehealth: Payer: Self-pay

## 2020-09-14 NOTE — Telephone Encounter (Signed)
Lm to relay date/time of covid test prior to PFT.  [facility]

## 2020-09-15 NOTE — Telephone Encounter (Signed)
Lm x2. Will close encounter per office protocol.  

## 2020-09-17 ENCOUNTER — Other Ambulatory Visit: Payer: Self-pay

## 2020-09-17 ENCOUNTER — Other Ambulatory Visit
Admission: RE | Admit: 2020-09-17 | Discharge: 2020-09-17 | Disposition: A | Discharge status: Home / Self Care | Payer: PPO | Source: Ambulatory Visit | Attending: Pulmonary Disease | Admitting: Pulmonary Disease

## 2020-09-17 DIAGNOSIS — Z20822 Contact with and (suspected) exposure to covid-19: Secondary | ICD-10-CM | POA: Diagnosis not present

## 2020-09-17 DIAGNOSIS — Z01812 Encounter for preprocedural laboratory examination: Secondary | ICD-10-CM | POA: Insufficient documentation

## 2020-09-18 LAB — SARS CORONAVIRUS 2 (TAT 6-24 HRS): SARS Coronavirus 2: NEGATIVE

## 2020-09-20 ENCOUNTER — Other Ambulatory Visit: Payer: Self-pay

## 2020-09-20 ENCOUNTER — Ambulatory Visit: Payer: PPO | Attending: Pulmonary Disease

## 2020-09-20 DIAGNOSIS — R0602 Shortness of breath: Secondary | ICD-10-CM | POA: Insufficient documentation

## 2020-09-20 MED ORDER — ALBUTEROL SULFATE (2.5 MG/3ML) 0.083% IN NEBU
2.5000 mg | INHALATION_SOLUTION | Freq: Once | RESPIRATORY_TRACT | Status: AC
  Administered 2020-09-20: 09:00:00 2.5 mg via RESPIRATORY_TRACT
  Filled 2020-09-20: qty 3

## 2020-09-22 LAB — PULMONARY FUNCTION TEST ARMC ONLY
DL/VA % pred: 95 %
DL/VA: 4.1 ml/min/mmHg/L
DLCO unc % pred: 98 %
DLCO unc: 32.04 ml/min/mmHg
FEF 25-75 Post: 4.99 L/sec
FEF 25-75 Pre: 3.58 L/sec
FEF2575-%Change-Post: 39 %
FEF2575-%Pred-Post: 134 %
FEF2575-%Pred-Pre: 96 %
FEV1-%Change-Post: 9 %
FEV1-%Pred-Post: 107 %
FEV1-%Pred-Pre: 98 %
FEV1-Post: 4.7 L
FEV1-Pre: 4.31 L
FEV1FVC-%Change-Post: 4 %
FEV1FVC-%Pred-Pre: 97 %
FEV6-%Change-Post: 5 %
FEV6-%Pred-Post: 108 %
FEV6-%Pred-Pre: 103 %
FEV6-Post: 5.95 L
FEV6-Pre: 5.67 L
FEV6FVC-%Change-Post: 0 %
FEV6FVC-%Pred-Post: 103 %
FEV6FVC-%Pred-Pre: 102 %
FVC-%Change-Post: 4 %
FVC-%Pred-Post: 105 %
FVC-%Pred-Pre: 100 %
FVC-Post: 5.98 L
Post FEV1/FVC ratio: 79 %
Post FEV6/FVC ratio: 100 %
Pre FEV1/FVC ratio: 75 %
Pre FEV6/FVC Ratio: 99 %
RV % pred: 58 %
RV: 1.35 L
TLC % pred: 89 %
TLC: 6.98 L

## 2020-09-23 ENCOUNTER — Telehealth: Payer: Self-pay

## 2020-09-23 MED ORDER — BREZTRI AEROSPHERE 160-9-4.8 MCG/ACT IN AERO: 2.0000 | INHALATION_SPRAY | Freq: Two times a day (BID) | RESPIRATORY_TRACT | 0 refills | Status: DC

## 2020-09-23 NOTE — Telephone Encounter (Signed)
Patient is aware of below recommendations and voiced his understanding.  Patient is agreeable with trying Breztri. 2 samples have been placed up front for pickup. Nothing further needed.

## 2020-09-23 NOTE — Telephone Encounter (Signed)
-----   Message from Tyler Pita, MD sent at 09/21/2020  5:06 PM EST ----- He has evidence of COPD with asthma overlap.  Appears to be mild.  He was given a sample of Breo Ellipta but it appears that he was not using it by the comments from the RT on the breathing test.  Recommend that he start using the inhaler and let us know how it does for him.

## 2020-09-23 NOTE — Telephone Encounter (Signed)
We can try Judithann Sauger if it is covered by his plan or appeal to medication assistance.  This will be 2 puffs twice a day.  We can let him have samples to try.

## 2020-09-23 NOTE — Telephone Encounter (Signed)
Spoke to patient and relayed below results.  Patient stated that he stopped Breo 2 weeks ago, due to increased throat clearing and phlegm. He tried Fire[facility] for 2 weeks.  Dr. Patsey Berthold, please advise. thanks

## 2020-09-24 ENCOUNTER — Telehealth: Payer: Self-pay | Admitting: Pulmonary Disease

## 2020-09-24 NOTE — Telephone Encounter (Signed)
Pt states he cannot pick up breztri samples today or next week. Will call when he can pick it up.

## 2020-09-24 NOTE — Telephone Encounter (Signed)
Spoke to patient, who stated that he can not pick Breztri samples up today or next week as he will be out of town.  (please see 09/23/2020 phone note for reference)  He will pickup once back in town.

## 2020-09-28 ENCOUNTER — Ambulatory Visit: Payer: PPO | Admitting: Primary Care

## 2020-11-02 ENCOUNTER — Encounter: Payer: Self-pay | Admitting: Internal Medicine

## 2020-11-02 MED ORDER — BUPROPION HCL ER (XL) 300 MG PO TB24
300.0000 mg | ORAL_TABLET | Freq: Every day | ORAL | 0 refills | Status: DC
Start: 2020-11-02 — End: 2021-05-02

## 2020-11-02 MED ORDER — FLUTICASONE PROPIONATE 50 MCG/ACT NA SUSP: 1.0000 | Freq: Two times a day (BID) | NASAL | 2 refills | Status: DC

## 2020-11-08 ENCOUNTER — Encounter: Payer: Self-pay | Admitting: Internal Medicine

## 2020-11-08 MED ORDER — MONTELUKAST SODIUM 10 MG PO TABS
10.0000 mg | ORAL_TABLET | Freq: Every day | ORAL | 0 refills | Status: DC
Start: 2020-11-08 — End: 2021-02-08

## 2020-11-09 ENCOUNTER — Encounter: Payer: Self-pay | Admitting: Internal Medicine

## 2020-11-09 MED ORDER — GABAPENTIN 800 MG PO TABS
800.0000 mg | ORAL_TABLET | Freq: Four times a day (QID) | ORAL | 0 refills | Status: DC
Start: 2020-11-09 — End: 2021-02-08

## 2020-11-12 ENCOUNTER — Encounter: Payer: Self-pay | Admitting: Internal Medicine

## 2020-11-12 DIAGNOSIS — R3911 Hesitancy of micturition: Secondary | ICD-10-CM

## 2020-11-12 MED ORDER — TAMSULOSIN HCL 0.4 MG PO CAPS: 0.4000 mg | ORAL_CAPSULE | Freq: Two times a day (BID) | ORAL | 0 refills | Status: DC

## 2020-11-20 ENCOUNTER — Encounter: Payer: Self-pay | Admitting: Internal Medicine

## 2020-11-22 ENCOUNTER — Other Ambulatory Visit: Payer: Self-pay

## 2020-11-22 MED ORDER — TRAMADOL HCL 50 MG PO TABS
ORAL_TABLET | ORAL | 0 refills | Status: DC
Start: 2020-11-22 — End: 2021-03-08

## 2020-11-22 NOTE — Telephone Encounter (Signed)
Last filled 10/2019 with 5 refills by Dr Deborra Medina, please advise

## 2020-11-24 ENCOUNTER — Encounter: Payer: Self-pay | Admitting: Internal Medicine

## 2020-11-25 MED ORDER — LEVOTHYROXINE SODIUM 75 MCG PO TABS: 75.0000 ug | ORAL_TABLET | Freq: Every day | ORAL | 0 refills | Status: DC

## 2020-12-06 ENCOUNTER — Other Ambulatory Visit: Payer: Self-pay | Admitting: Internal Medicine

## 2020-12-20 ENCOUNTER — Encounter: Payer: Self-pay | Admitting: Internal Medicine

## 2020-12-20 MED ORDER — METHOCARBAMOL 500 MG PO TABS: ORAL_TABLET | ORAL | 5 refills | Status: DC

## 2020-12-20 NOTE — Telephone Encounter (Signed)
Last filled by Dr Deborra Medina... please advise

## 2021-01-03 ENCOUNTER — Other Ambulatory Visit: Payer: Self-pay | Admitting: Internal Medicine

## 2021-01-17 ENCOUNTER — Encounter: Payer: Self-pay | Admitting: Internal Medicine

## 2021-01-26 ENCOUNTER — Encounter: Payer: Self-pay | Admitting: Pulmonary Disease

## 2021-01-26 ENCOUNTER — Other Ambulatory Visit: Payer: Self-pay

## 2021-01-26 ENCOUNTER — Ambulatory Visit
Admission: EM | Admit: 2021-01-26 | Discharge: 2021-01-26 | Disposition: A | Discharge status: Home / Self Care | Payer: Medicare HMO | Attending: Emergency Medicine | Admitting: Emergency Medicine

## 2021-01-26 DIAGNOSIS — J069 Acute upper respiratory infection, unspecified: Secondary | ICD-10-CM

## 2021-01-26 DIAGNOSIS — Z1152 Encounter for screening for COVID-19: Secondary | ICD-10-CM | POA: Diagnosis not present

## 2021-01-26 DIAGNOSIS — Z20822 Contact with and (suspected) exposure to covid-19: Secondary | ICD-10-CM

## 2021-01-26 NOTE — Discharge Instructions (Signed)
Your COVID test is pending.  You should self quarantine until the test result is back.    Take Tylenol or ibuprofen as needed for fever or discomfort.  Take plain over-the-counter Mucinex as needed for congestion.  Rest and keep yourself hydrated.    Follow-up with your primary care provider if your symptoms are not improving.

## 2021-01-26 NOTE — ED Triage Notes (Signed)
Pt had positive covid exposure on Sunday and developed symptoms on tuesday , home covid test negative

## 2021-01-26 NOTE — ED Provider Notes (Signed)
[facility]    CSN: [id number] Arrival date & time: 01/26/21  1432      History   Chief Complaint Chief Complaint  Patient presents with  . Nasal Congestion    HPI Corey Bell is a 55 y.o. male.   Patient presents with 1 day history of sinus congestion, postnasal drip, sinus pressure.  He denies fever, chills, rash, sore throat, cough, shortness of breath, or other symptoms.  No treatment attempted at home.  His mother tested positive for COVID 3 days ago.  His medical history includes seasonal allergies, hypertension, COPD, diabetes, iron deficiency anemia, GERD, diverticulitis, DDD, posttraumatic stress disorder, adjustment reaction with anxiety and depression, chronic pain syndrome.  The history is provided by the patient and medical records.    Past Medical History:  Diagnosis Date  . Anxiety   . Aphthous ulcer 10-04-12   improving  . Arthritis    left index fnger  . Carpal tunnel syndrome, bilateral    damaged nerve in neck  . Chronic pain syndrome   . COPD (chronic obstructive pulmonary disease) (HCC)    mild  . Depression   . Diabetes mellitus without complication ([place])    type 2 , has not been medicated in over 1 year, no meds since Gastric sleeve  . Diverticulitis of colon   . Esophageal reflux   . H/O acute prostatitis   . Hypothyroidism   . Insomnia, unspecified   . Mixed hyperlipidemia   . Peyronie disease 12-20-12   PER uROLOGY  . Seasonal allergies   . Sleep apnea    uses cpap  . Tympanic membrane perforation    history of    Patient Active Problem List   Diagnosis Date Noted  . BPH (benign prostatic hyperplasia) 03/02/2020  . Iron deficiency anemia 03/02/2020  . Adjustment reaction with anxiety and depression 03/06/2019  . Post-traumatic stress disorder, acute 01/27/2019  . Diabetes mellitus type 2, diet-controlled ([place]) 07/31/2017  . GERD (gastroesophageal reflux disease) 04/07/2015  . HLD (hyperlipidemia) 06/03/2014  . DDD  (degenerative disc disease), cervical 10/13/2013  . DDD (degenerative disc disease), lumbosacral 10/13/2013  . OSA (obstructive sleep apnea) 07/01/2013  . Essential hypertension 05/29/2013  . Hypothyroidism   . COPD (chronic obstructive pulmonary disease) ([place]) 01/07/2013    Past Surgical History:  Procedure Laterality Date  . LAPAROSCOPIC GASTRIC SLEEVE RESECTION N/A 07/31/2017   Procedure: LAPAROSCOPIC GASTRIC SLEEVE RESECTION WITH UPPER ENDOSCOPY;  Surgeon: Alphonsa Overall, MD;  Location: [facility];  Service: General;  Laterality: N/A;  . LumbarSacral Disc Surgery     L5-S1  . MENISCUS REPAIR Left    torn meniscus  . NESBIT PROCEDURE N/A 08/22/2013   Procedure: 16 DOT PLACTATION;  Surgeon: Claybon Jabs, MD;  Location: [facility];  Service: Urology;  Laterality: N/A;  . TYMPANOSTOMY TUBE PLACEMENT  1975  . ULNAR COLLATERAL LIGAMENT REPAIR Right 04/17/2019   Procedure: RIGHT THUMB ULNAR COLLATERAL LIGAMENT REPAIR;  Surgeon: Leanora Cover, MD;  Location: [facility];  Service: Orthopedics;  Laterality: Right;  . ULNAR COLLATERAL LIGAMENT REPAIR Right 10/16/2019   Procedure: RIGHT THUMB ULNAR COLLATERAL LIGAMENT RECONSTRUCTION;  Surgeon: Leanora Cover, MD;  Location: [place];  Service: Orthopedics;  Laterality: Right;  block in preop  . VASECTOMY Bilateral 08/22/2013   Procedure: VASECTOMY;  Surgeon: Claybon Jabs, MD;  Location: [facility];  Service: Urology;  Laterality: Bilateral;       Home Medications  Prior to Admission medications   Medication Sig Start Date End Date Taking? Authorizing Provider  albuterol (VENTOLIN HFA) 108 (90 Base) MCG/ACT inhaler Inhale 2 puffs into the lungs every 6 (six) hours as needed for wheezing or shortness of breath. 06/02/20   Lavonia Drafts, MD  aspirin 325 MG tablet Take 325 mg by mouth as needed.    [provider]  Budeson-Glycopyrrol-Formoterol (BREZTRI AEROSPHERE) 160-9-4.8  MCG/ACT AERO Inhale 2 puffs into the lungs in the morning and at bedtime. 09/23/20   Tyler Pita, MD  buPROPion (WELLBUTRIN XL) 300 MG 24 hr tablet Take 1 tablet (300 mg total) by mouth daily. 11/02/20   Jearld Fenton, NP  calcium carbonate (TUMS - DOSED IN MG ELEMENTAL CALCIUM) 500 MG chewable tablet Chew 3 tablets by mouth daily.    [provider]  citalopram (CELEXA) 40 MG tablet TAKE 1 TABLET BY MOUTH DAILY 12/06/20   Jearld Fenton, NP  cyclobenzaprine (FLEXERIL) 10 MG tablet Take 1 tablet (10 mg total) by mouth at bedtime. 01/23/20   Jearld Fenton, NP  diphenhydrAMINE (BENADRYL) 25 mg capsule Take 50 mg by mouth at bedtime.    [provider]  doxycycline (VIBRAMYCIN) 100 MG capsule Take 1 capsule (100 mg total) by mouth 2 (two) times daily. 05/27/20   Sharion Balloon, NP  ezetimibe (ZETIA) 10 MG tablet Take 1 tablet (10 mg total) by mouth daily. 03/02/20   Jearld Fenton, NP  Ferrous Sulfate (IRON) 325 (65 Fe) MG TABS Take by mouth daily.     [provider]  fexofenadine (ALLEGRA) 180 MG tablet Take 180 mg by mouth daily.    [provider]  fluticasone (FLONASE) 50 MCG/ACT nasal spray Place 1 spray into both nostrils 2 (two) times daily. 11/02/20   Jearld Fenton, NP  fluticasone furoate-vilanterol (BREO ELLIPTA) 100-25 MCG/INH AEPB Inhale 1 puff into the lungs daily. 08/17/20   Tyler Pita, MD  gabapentin (NEURONTIN) 800 MG tablet Take 1 tablet (800 mg total) by mouth 4 (four) times daily. 11/09/20   Jearld Fenton, NP  levothyroxine (SYNTHROID) 75 MCG tablet Take 1 tablet (75 mcg total) by mouth daily. 11/25/20   Jearld Fenton, NP  methocarbamol (ROBAXIN) 500 MG tablet TAKE 2 TABLETS BY MOUTH EVERY 6 HOURS ASNEEDED FOR MUSCLE SPASMS 12/20/20   Jearld Fenton, NP  montelukast (SINGULAIR) 10 MG tablet Take 1 tablet (10 mg total) by mouth at bedtime. 11/08/20   Jearld Fenton, NP  pantoprazole (PROTONIX) 40 MG tablet TAKE 1 TABLET BY MOUTH  DAILY 01/03/21   Jearld Fenton, NP  predniSONE (DELTASONE) 10 MG tablet Take 3 tabs on days 1-3, take 2 tabs on days 4-6, take 1 tab on days 7-9 07/29/20   Jearld Fenton, NP  Saw Palmetto 450 MG CAPS Take 4 capsules by mouth daily.     [provider]  sodium chloride (OCEAN) 0.65 % SOLN nasal spray Place 1 spray into both nostrils as needed for congestion. 06/17/20   Montine Circle, PA-C  tamsulosin (FLOMAX) 0.4 MG CAPS capsule Take 1 capsule (0.4 mg total) by mouth 2 (two) times daily. 11/12/20   Jearld Fenton, NP  Thiamine HCl (VITAMIN B-1) 250 MG tablet Take 1 tablet by mouth daily.    [provider]  traMADol (ULTRAM) 50 MG tablet TAKE 1 TABLET BY MOUTH EVERY 8 HOURS AS NEEDED FOR MODERATE TO SEVEREPAIN 11/22/20   Jearld Fenton, NP  Trolamine Salicylate (ASPERCREME EX) Apply 1 application topically as needed (for pain).    [provider]  vitamin B-12 (CYANOCOBALAMIN) 1000 MCG tablet Take 1 tablet by mouth daily.    [provider]    Family History Family History  Problem Relation Age of Onset  . Congestive Heart Failure Father   . Heart disease Father   . COPD Father   . Kidney cancer Paternal Grandfather   . Heart disease Brother        stents at 61 yo  . Bladder Cancer Paternal Uncle   . Lupus Brother   . Heart failure Paternal Grandmother   . Parkinsonism Neg Hx     Social History Social History   Tobacco Use  . Smoking status: Former Smoker    Packs/day: 2.00    Years: 20.00    Pack years: 40.00    Types: Cigarettes    Quit date: 10/09/2006    Years since quitting: 14.3  . Smokeless tobacco: Never Used  Vaping Use  . Vaping Use: Never used  Substance Use Topics  . Alcohol use: Yes    Alcohol/week: 1.0 standard drink    Types: 1 Cans of beer per week    Comment: rarely  . Drug use: No     Allergies   Augmentin [amoxicillin-pot clavulanate], Demerol [meperidine], Eggs or egg-derived products, Sulfamethoxazole, Statins,  Sulfa antibiotics, and Sulfacetamide sodium   Review of Systems Review of Systems  Constitutional: Negative for chills and fever.  HENT: Positive for congestion, postnasal drip and sinus pressure. Negative for ear pain and sore throat.   Eyes: Negative for pain and visual disturbance.  Respiratory: Negative for cough and shortness of breath.   Cardiovascular: Negative for chest pain and palpitations.  Gastrointestinal: Negative for abdominal pain and vomiting.  Genitourinary: Negative for dysuria and hematuria.  Musculoskeletal: Negative for arthralgias and back pain.  Skin: Negative for color change and rash.  Neurological: Negative for seizures and syncope.  All other systems reviewed and are negative.    Physical Exam Triage Vital Signs ED Triage Vitals  Enc Vitals Group     BP      Pulse      Resp      Temp      Temp src      SpO2      Weight      Height      Head Circumference      Peak Flow      Pain Score      Pain Loc      Pain Edu?      Excl. in [place]?    No data found.  Updated Vital Signs There were no vitals taken for this visit.  Visual Acuity Right Eye Distance:   Left Eye Distance:   Bilateral Distance:    Right Eye Near:   Left Eye Near:    Bilateral Near:     Physical Exam Vitals and nursing note reviewed.  Constitutional:      General: He is not in acute distress.    Appearance: He is well-developed. He is not ill-appearing.  HENT:     Head: Normocephalic and atraumatic.     Right Ear: Tympanic membrane normal.     Left Ear: Tympanic membrane normal.     Nose: Nose normal.     Mouth/Throat:     Mouth: Mucous membranes are moist.     Pharynx: Oropharynx is clear.  Eyes:  Conjunctiva/sclera: Conjunctivae normal.  Cardiovascular:     Rate and Rhythm: Normal rate and regular rhythm.     Heart sounds: Normal heart sounds.  Pulmonary:     Effort: Pulmonary effort is normal. No respiratory distress.     Breath sounds: Normal breath  sounds.  Abdominal:     Palpations: Abdomen is soft.     Tenderness: There is no abdominal tenderness.  Musculoskeletal:     Cervical back: Neck supple.  Skin:    General: Skin is warm and dry.  Neurological:     General: No focal deficit present.     Mental Status: He is alert and oriented to person, place, and time.  Psychiatric:        Mood and Affect: Mood normal.        Behavior: Behavior normal.      UC Treatments / Results  Labs (all labs ordered are listed, but only abnormal results are displayed) Labs Reviewed  NOVEL CORONAVIRUS, NAA    EKG   Radiology No results found.  Procedures Procedures (including critical care time)  Medications Ordered in UC Medications - No data to display  Initial Impression / Assessment and Plan / UC Course  I have reviewed the triage vital signs and the nursing notes.  Pertinent labs & imaging results that were available during my care of the patient were reviewed by me and considered in my medical decision making (see chart for details).   Viral URI, exposure to COVID-19 virus.  COVID pending.  Instructed patient to self quarantine until the test results are back.  Discussed symptomatic treatment including Tylenol or ibuprofen, Mucinex, rest, hydration.  Instructed patient to follow up with PCP if his symptoms are not improving.  Patient agrees to plan of care.    Final Clinical Impressions(s) / UC Diagnoses   Final diagnoses:  Viral URI  Exposure to COVID-19 virus     Discharge Instructions     Your COVID test is pending.  You should self quarantine until the test result is back.    Take Tylenol or ibuprofen as needed for fever or discomfort.  Take plain over-the-counter Mucinex as needed for congestion.  Rest and keep yourself hydrated.    Follow-up with your primary care provider if your symptoms are not improving.        ED Prescriptions    None     PDMP not reviewed this encounter.   Sharion Balloon,  NP 01/26/21 [phone]

## 2021-01-27 LAB — NOVEL CORONAVIRUS, NAA: SARS-CoV-2, NAA: NOT DETECTED

## 2021-01-27 LAB — SARS-COV-2, NAA 2 DAY TAT

## 2021-02-03 ENCOUNTER — Other Ambulatory Visit: Payer: Self-pay | Admitting: Internal Medicine

## 2021-02-03 DIAGNOSIS — R3911 Hesitancy of micturition: Secondary | ICD-10-CM

## 2021-02-07 ENCOUNTER — Other Ambulatory Visit: Payer: Self-pay | Admitting: Internal Medicine

## 2021-02-08 ENCOUNTER — Other Ambulatory Visit: Payer: Self-pay | Admitting: Internal Medicine

## 2021-02-16 ENCOUNTER — Encounter (HOSPITAL_COMMUNITY): Payer: Self-pay | Admitting: *Deleted

## 2021-02-22 ENCOUNTER — Other Ambulatory Visit: Payer: Self-pay

## 2021-02-22 ENCOUNTER — Ambulatory Visit (INDEPENDENT_AMBULATORY_CARE_PROVIDER_SITE_OTHER): Payer: Medicare HMO | Admitting: Internal Medicine

## 2021-02-22 ENCOUNTER — Encounter: Payer: Self-pay | Admitting: Internal Medicine

## 2021-02-22 VITALS — BP 134/68 | HR 56 | Temp 97.3°F | Resp 18 | Ht 74.0 in | Wt 262.8 lb

## 2021-02-22 DIAGNOSIS — D519 Vitamin B12 deficiency anemia, unspecified: Secondary | ICD-10-CM | POA: Diagnosis not present

## 2021-02-22 DIAGNOSIS — R5383 Other fatigue: Secondary | ICD-10-CM

## 2021-02-22 DIAGNOSIS — F4323 Adjustment disorder with mixed anxiety and depressed mood: Secondary | ICD-10-CM | POA: Diagnosis not present

## 2021-02-22 DIAGNOSIS — F4311 Post-traumatic stress disorder, acute: Secondary | ICD-10-CM

## 2021-02-22 DIAGNOSIS — E119 Type 2 diabetes mellitus without complications: Secondary | ICD-10-CM

## 2021-02-22 DIAGNOSIS — E559 Vitamin D deficiency, unspecified: Secondary | ICD-10-CM | POA: Diagnosis not present

## 2021-02-22 DIAGNOSIS — E039 Hypothyroidism, unspecified: Secondary | ICD-10-CM

## 2021-02-22 DIAGNOSIS — J449 Chronic obstructive pulmonary disease, unspecified: Secondary | ICD-10-CM | POA: Diagnosis not present

## 2021-02-22 NOTE — Progress Notes (Signed)
Subjective:    Patient ID: Corey Bell, male    DOB: 05/22/1966, 55 y.o.   MRN: [id number]  HPI  Patient presents the clinic today with complaint of fatigue. This started about 2-3 months ago. He has a history of OSA, anxiety, depression and PTSD. He wears his CPAP every night, sleeps for about 8 hours, feels rested when he wakes up. He does nap daily for 1.5 hours daily, does not wear his CPAP during this time.  His anxiety, depression and PTSD are managed on citalopram and Wellbutrin.  He reports he is in good mood most days.  He has had some recent stress with his mother being recurrently in the hospital.  He reports he just has no motivation to do anything that he likes to do.  He does work 1 day a week but reports this wears him out.  Review of Systems      Past Medical History:  Diagnosis Date  . Anxiety   . Aphthous ulcer 10-04-12   improving  . Arthritis    left index fnger  . Carpal tunnel syndrome, bilateral    damaged nerve in neck  . Chronic pain syndrome   . COPD (chronic obstructive pulmonary disease) (HCC)    mild  . Depression   . Diabetes mellitus without complication ([place])    type 2 , has not been medicated in over 1 year, no meds since Gastric sleeve  . Diverticulitis of colon   . Esophageal reflux   . H/O acute prostatitis   . Hypothyroidism   . Insomnia, unspecified   . Mixed hyperlipidemia   . Peyronie disease 12-20-12   PER uROLOGY  . Seasonal allergies   . Sleep apnea    uses cpap  . Tympanic membrane perforation    history of    Current Outpatient Medications  Medication Sig Dispense Refill  . albuterol (VENTOLIN HFA) 108 (90 Base) MCG/ACT inhaler Inhale 2 puffs into the lungs every 6 (six) hours as needed for wheezing or shortness of breath. 8 g 2  . aspirin 325 MG tablet Take 325 mg by mouth as needed.    . Budeson-Glycopyrrol-Formoterol (BREZTRI AEROSPHERE) 160-9-4.8 MCG/ACT AERO Inhale 2 puffs into the lungs in the morning and at  bedtime. 5.9 g 0  . buPROPion (WELLBUTRIN XL) 300 MG 24 hr tablet Take 1 tablet (300 mg total) by mouth daily. 90 tablet 0  . calcium carbonate (TUMS - DOSED IN MG ELEMENTAL CALCIUM) 500 MG chewable tablet Chew 3 tablets by mouth daily.    . citalopram (CELEXA) 40 MG tablet TAKE 1 TABLET BY MOUTH DAILY 90 tablet 3  . cyclobenzaprine (FLEXERIL) 10 MG tablet Take 1 tablet (10 mg total) by mouth at bedtime. 30 tablet 5  . diphenhydrAMINE (BENADRYL) 25 mg capsule Take 50 mg by mouth at bedtime.    Marland Kitchen doxycycline (VIBRAMYCIN) 100 MG capsule Take 1 capsule (100 mg total) by mouth 2 (two) times daily. 20 capsule 0  . ezetimibe (ZETIA) 10 MG tablet Take 1 tablet (10 mg total) by mouth daily. 90 tablet 3  . Ferrous Sulfate (IRON) 325 (65 Fe) MG TABS Take by mouth daily.     . fexofenadine (ALLEGRA) 180 MG tablet Take 180 mg by mouth daily.    . fluticasone (FLONASE) 50 MCG/ACT nasal spray Place 1 spray into both nostrils 2 (two) times daily. 16 g 2  . fluticasone furoate-vilanterol (BREO ELLIPTA) 100-25 MCG/INH AEPB Inhale 1 puff into the lungs daily. [place]  each 6  . gabapentin (NEURONTIN) 800 MG tablet TAKE 1 TABLET BY MOUTH 4 TIMES DAILY 360 tablet 0  . levothyroxine (SYNTHROID) 75 MCG tablet Take 1 tablet (75 mcg total) by mouth daily. 90 tablet 0  . methocarbamol (ROBAXIN) 500 MG tablet TAKE 2 TABLETS BY MOUTH EVERY 6 HOURS ASNEEDED FOR MUSCLE SPASMS 120 tablet 5  . montelukast (SINGULAIR) 10 MG tablet TAKE 1 TABLET BY MOUTH AT BEDTIME 90 tablet 0  . pantoprazole (PROTONIX) 40 MG tablet TAKE 1 TABLET BY MOUTH DAILY 90 tablet 3  . Saw Palmetto 450 MG CAPS Take 4 capsules by mouth daily.     . sodium chloride (OCEAN) 0.65 % SOLN nasal spray Place 1 spray into both nostrils as needed for congestion. 15 mL 0  . tamsulosin (FLOMAX) 0.4 MG CAPS capsule TAKE 1 CAPSULE BY MOUTH 2 TIMES DAILY 180 capsule 0  . Thiamine HCl (VITAMIN B-1) 250 MG tablet Take 1 tablet by mouth daily.    . traMADol (ULTRAM) 50 MG  tablet TAKE 1 TABLET BY MOUTH EVERY 8 HOURS AS NEEDED FOR MODERATE TO SEVEREPAIN 30 tablet 0  . Trolamine Salicylate (ASPERCREME EX) Apply 1 application topically as needed (for pain).    . vitamin B-12 (CYANOCOBALAMIN) 1000 MCG tablet Take 1 tablet by mouth daily.     No current facility-administered medications for this visit.    Allergies  Allergen Reactions  . Augmentin [Amoxicillin-Pot Clavulanate] Diarrhea  . Demerol [Meperidine] Other (See Comments)    "goes crazy and has the strength of 10 men"  . Eggs Or Egg-Derived Products Diarrhea and Nausea And Vomiting  . Sulfamethoxazole Other (See Comments)    Blisters form in mouth  . Statins Rash  . Sulfa Antibiotics Rash  . Sulfacetamide Sodium Rash    Family History  Problem Relation Age of Onset  . Congestive Heart Failure Father   . Heart disease Father   . COPD Father   . Kidney cancer Paternal Grandfather   . Heart disease Brother        stents at 69 yo  . Bladder Cancer Paternal Uncle   . Lupus Brother   . Heart failure Paternal Grandmother   . Parkinsonism Neg Hx     Social History   Socioeconomic History  . Marital status: Married    Spouse name: [name]  . Number of children: 0  . Years of education: college  . Highest education level: Not on file  Occupational History  . 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 Smoking status: Former Smoker    Packs/day: 2.00    Years: 20.00    Pack years: 40.00    Types: Cigarettes    Quit date: 10/09/2006    Years since quitting: 14.3  . Smokeless tobacco: Never Used  Vaping Use  . Vaping Use: Never used  Substance and Sexual Activity  . Alcohol use: Yes    Alcohol/week: 1.0 standard drink    Types: 1 Cans of beer per week    Comment: rarely  . Drug use: No  . Sexual activity: Yes    Birth control/protection: Surgical  Other Topics Concern  . 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  Denies fever, malaise, headache or abrupt weight changes.  Respiratory: Denies difficulty breathing, shortness of breath, cough or sputum production.   Cardiovascular: Denies chest pain, chest tightness, palpitations or swelling in the hands or feet.  Neurological: Denies dizziness, difficulty with memory, difficulty with speech or problems with balance and coordination.  Psych: Patient has a history of anxiety, depression and PTSD.  Denies SI/HI.  No other specific complaints in a complete review of systems (except as listed in HPI above).  Objective:   Physical Exam  BP 134/68 (BP Location: Left Arm, Patient Position: Sitting, Cuff Size: Large)   Pulse (!) 56   Temp (!) 97.3 F (36.3 C) (Temporal)   Resp 18   Ht 6\' 2"  (1.88 m)   Wt 262 lb 12.8 oz (119.2 kg)   SpO2 100%   BMI 33.74 kg/m   Wt Readings from Last 3 Encounters:  08/17/20 257 lb 9.6 oz (116.8 kg)  07/29/20 258 lb (117 kg)  06/29/20 250 lb (113.4 kg)    General: Appears his stated age, obese, in NAD. Skin: No ulcerations. HEENT: Head: normal shape and size; Eyes: sclera white and EOMs intact;  Neck:  Neck supple, trachea midline. No masses, lumps or thyromegaly present.   Cardiovascular: Bradycardic with normal rhythm. Pulmonary/Chest: Normal effort and positive vesicular breath sounds. No respiratory distress. No wheezes, rales or ronchi noted.  Musculoskeletal: No difficulty with gait.  Neurological: Alert and oriented.  Psychiatric: Mood and affect normal. Behavior is normal. Judgment and thought content normal.    BMET    Component Value Date/Time   NA 141 06/02/2020 0431   NA 142 01/27/2018 0000   NA 138 12/26/2012 1936   K 4.0 06/02/2020 0431   K 3.9 12/26/2012 1936   CL 107 06/02/2020 0431   CL 106 12/26/2012 1936   CO2 27 06/02/2020 0431   CO2 24 12/26/2012 1936   GLUCOSE 138 (H) 06/02/2020 0431   GLUCOSE 124 (H) 12/26/2012 1936   BUN 18 06/02/2020 0431   BUN 19 01/27/2018 0000   BUN 22 (H) 12/26/2012 1936   CREATININE 1.00 06/02/2020 0431   CREATININE 1.24 09/23/2015 1440   CALCIUM 9.0 06/02/2020 0431   CALCIUM 8.4 (L) 12/26/2012 1936   GFRNONAA >60 06/02/2020 0431   GFRNONAA 58 (L) 12/26/2012 1936   GFRAA >60 06/02/2020 0431   GFRAA >60 12/26/2012 1936    Lipid Panel     Component Value Date/Time   CHOL 157 06/10/2020 0919   TRIG 149.0 06/10/2020 0919   HDL 42.50 06/10/2020 0919   CHOLHDL 4 06/10/2020 0919   VLDL 29.8 06/10/2020 0919   LDLCALC 85 06/10/2020 0919    CBC    Component Value Date/Time   WBC 5.2 06/02/2020 0431   RBC 4.68  06/02/2020 0431   HGB 15.0 06/02/2020 0431   HGB 15.2 12/26/2012 1936   HCT 41.5 06/02/2020 0431   HCT 44.6 12/26/2012 1936   PLT 167 06/02/2020 0431   PLT 218 12/26/2012 1936   MCV 88.7 06/02/2020 0431   MCV 94 12/26/2012 1936   MCH 32.1 06/02/2020 0431   MCHC 36.1 (H) 06/02/2020 0431   RDW 12.6 06/02/2020 0431   RDW 13.3 12/26/2012 1936   LYMPHSABS 1.9 06/02/2020 0431   MONOABS 0.4 06/02/2020 0431   EOSABS 0.2 06/02/2020 0431   BASOSABS 0.1 06/02/2020 0431    Hgb A1C Lab Results  Component Value Date   HGBA1C 5.7 12/16/2019          Assessment & Plan:    Fatigue:  DDx include OSA needing repeat sleep study, deconditioning, anxiety/depression/PTSD and mental stress uncontrolled, elevated glucose, elevated TSH, bradycardia Will check TSH, Vit D, B12 and A1C today If labs normal, this could be do to mental stress and mood issues Encouraged adequate sleep, exercise when able  Will follow up after labs, return precautions discussed  Webb Silversmith, NP This visit occurred during the SARS-CoV-2 public health emergency.  Safety protocols were in place, including screening questions prior to the visit, additional usage of staff PPE, and extensive cleaning of exam room while observing appropriate contact time as indicated for disinfecting solutions.

## 2021-02-22 NOTE — Patient Instructions (Signed)

## 2021-02-23 LAB — TSH: TSH: 1.24 mIU/L (ref 0.40–4.50)

## 2021-02-23 LAB — VITAMIN B12: Vitamin B-12: 590 pg/mL (ref 200–1100)

## 2021-02-23 LAB — VITAMIN D 25 HYDROXY (VIT D DEFICIENCY, FRACTURES): Vit D, 25-Hydroxy: 23 ng/mL — ABNORMAL LOW (ref 30–100)

## 2021-02-23 LAB — HEMOGLOBIN A1C
Hgb A1c MFr Bld: 5.8 % of total Hgb — ABNORMAL HIGH (ref <5.7)
Mean Plasma Glucose: 120 mg/dL
eAG (mmol/L): 6.6 mmol/L

## 2021-02-23 MED ORDER — VITAMIN D (ERGOCALCIFEROL) 1.25 MG (50000 UNIT) PO CAPS: 50000.0000 [IU] | ORAL_CAPSULE | ORAL | 0 refills | Status: DC

## 2021-02-23 NOTE — Addendum Note (Signed)
Addended by: Jearld Fenton on: 02/23/2021 11:34 AM   Modules accepted: Orders

## 2021-02-26 ENCOUNTER — Encounter: Payer: Self-pay | Admitting: Internal Medicine

## 2021-02-27 ENCOUNTER — Encounter: Payer: Self-pay | Admitting: Internal Medicine

## 2021-02-28 ENCOUNTER — Other Ambulatory Visit: Payer: Self-pay | Admitting: Internal Medicine

## 2021-02-28 MED ORDER — HYDROXYZINE PAMOATE 25 MG PO CAPS: 25.0000 mg | ORAL_CAPSULE | Freq: Three times a day (TID) | ORAL | 0 refills | Status: DC | PRN

## 2021-03-01 ENCOUNTER — Encounter: Payer: Self-pay | Admitting: Internal Medicine

## 2021-03-01 MED ORDER — CLONAZEPAM 0.5 MG PO TABS: 0.5000 mg | ORAL_TABLET | Freq: Every day | ORAL | 0 refills | Status: DC | PRN

## 2021-03-08 ENCOUNTER — Encounter: Payer: Self-pay | Admitting: Internal Medicine

## 2021-03-08 MED ORDER — TRAMADOL HCL 50 MG PO TABS: ORAL_TABLET | ORAL | 0 refills | Status: DC

## 2021-04-23 ENCOUNTER — Other Ambulatory Visit: Payer: Self-pay | Admitting: Internal Medicine

## 2021-04-25 ENCOUNTER — Other Ambulatory Visit: Payer: Self-pay

## 2021-04-25 ENCOUNTER — Encounter: Payer: Self-pay | Admitting: Internal Medicine

## 2021-04-25 ENCOUNTER — Ambulatory Visit (INDEPENDENT_AMBULATORY_CARE_PROVIDER_SITE_OTHER): Payer: Medicare HMO | Admitting: Internal Medicine

## 2021-04-25 ENCOUNTER — Other Ambulatory Visit: Payer: Self-pay | Admitting: Internal Medicine

## 2021-04-25 VITALS — BP 126/61 | HR 64 | Temp 97.3°F | Resp 17 | Ht 74.0 in | Wt 262.0 lb

## 2021-04-25 DIAGNOSIS — E6609 Other obesity due to excess calories: Secondary | ICD-10-CM | POA: Diagnosis not present

## 2021-04-25 DIAGNOSIS — L089 Local infection of the skin and subcutaneous tissue, unspecified: Secondary | ICD-10-CM | POA: Diagnosis not present

## 2021-04-25 DIAGNOSIS — M5030 Other cervical disc degeneration, unspecified cervical region: Secondary | ICD-10-CM

## 2021-04-25 DIAGNOSIS — Z6836 Body mass index (BMI) 36.0-36.9, adult: Secondary | ICD-10-CM | POA: Insufficient documentation

## 2021-04-25 DIAGNOSIS — G8929 Other chronic pain: Secondary | ICD-10-CM | POA: Insufficient documentation

## 2021-04-25 DIAGNOSIS — B9689 Other specified bacterial agents as the cause of diseases classified elsewhere: Secondary | ICD-10-CM | POA: Diagnosis not present

## 2021-04-25 DIAGNOSIS — Z6833 Body mass index (BMI) 33.0-33.9, adult: Secondary | ICD-10-CM

## 2021-04-25 DIAGNOSIS — B372 Candidiasis of skin and nail: Secondary | ICD-10-CM | POA: Diagnosis not present

## 2021-04-25 DIAGNOSIS — M549 Dorsalgia, unspecified: Secondary | ICD-10-CM

## 2021-04-25 MED ORDER — ERYTHROMYCIN 2 % EX PADS: 1.0000 "application " | MEDICATED_PAD | Freq: Every day | CUTANEOUS | 2 refills | Status: DC

## 2021-04-25 NOTE — Assessment & Plan Note (Signed)
Advised him to continue Ibuprofen, Methocarbamol and Tramadol as needed Encouraged stretching, heat and massage He is not interested in referral for repeat injections at this time

## 2021-04-25 NOTE — Progress Notes (Signed)
Subjective:    Patient ID: Corey Bell, male    DOB: 02-11-1966, 55 y.o.   MRN: [id number]  HPI  Patient presents the clinic today with complaint of chronic pain in the right subscapular area.  He reports this started about 12 years ago.  He describes the pain as a sharp and stabbing.  The pain radiates into his right upper arm.  He also has some associated right-sided neck pain.  He denies numbness, tingling or weakness.  He originally saw a Restaurant manager, fast food who referred him to an orthopedist.  He reports he has had an x-ray and an MRI and was told that he had a pinched nerve.  He has had an injection which has not helped.  He reports he takes Ibuprofen, Methocarbamol and Tramadol as needed with some relief of symptoms.  He also reports needing a refill of his erythromycin cream.  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  He also reports chronic jock itch.  He is using OTC Clotrimazole cream and Goldbond medicated powder.  He is wondering if there are any other available treatment options.  Review of Systems     Past Medical History:  Diagnosis Date   Anxiety    Aphthous ulcer 10-04-12   improving   Arthritis    left index fnger   Carpal tunnel syndrome, bilateral    damaged nerve in neck   Chronic pain syndrome    COPD (chronic obstructive pulmonary disease) (HCC)    mild   Depression    Diabetes mellitus without complication (HCC)    type 2 , has not been medicated in over 1 year, no meds since Gastric sleeve   Diverticulitis of colon    Esophageal reflux    H/O acute prostatitis    Hypothyroidism    Insomnia, unspecified    Mixed hyperlipidemia    Peyronie disease 12-20-12   PER uROLOGY   Seasonal allergies    Sleep apnea    uses cpap   Tympanic membrane perforation    history of    Current Outpatient Medications  Medication Sig Dispense Refill   albuterol (VENTOLIN HFA) 108 (90 Base) MCG/ACT inhaler Inhale 2  puffs into the lungs every 6 (six) hours as needed for wheezing or shortness of breath. 8 g 2   buPROPion (WELLBUTRIN XL) 300 MG 24 hr tablet Take 1 tablet (300 mg total) by mouth daily. 90 tablet 0   calcium carbonate (TUMS - DOSED IN MG ELEMENTAL CALCIUM) 500 MG chewable tablet Chew 3 tablets by mouth daily.     citalopram (CELEXA) 40 MG tablet TAKE 1 TABLET BY MOUTH DAILY 90 tablet 3   clonazePAM (KLONOPIN) 0.5 MG tablet Take 1 tablet (0.5 mg total) by mouth daily as needed for anxiety. 20 tablet 0   cyclobenzaprine (FLEXERIL) 10 MG tablet Take 1 tablet (10 mg total) by mouth at bedtime. 30 tablet 5   diphenhydrAMINE (BENADRYL) 25 mg capsule Take 50 mg by mouth at bedtime.     ezetimibe (ZETIA) 10 MG tablet Take 1 tablet (10 mg total) by mouth daily. 90 tablet 3   Ferrous Sulfate (IRON) 325 (65 Fe) MG TABS Take by mouth daily.      fexofenadine (ALLEGRA) 180 MG tablet Take 180 mg by mouth daily.     fluticasone (FLONASE) 50 MCG/ACT nasal spray Place 1 spray into both nostrils 2 (two) times daily. 16 g 2   gabapentin (NEURONTIN)  800 MG tablet TAKE 1 TABLET BY MOUTH 4 TIMES DAILY 360 tablet 0   hydrOXYzine (VISTARIL) 25 MG capsule Take 1 capsule (25 mg total) by mouth every 8 (eight) hours as needed. 90 capsule 0   levothyroxine (SYNTHROID) 75 MCG tablet TAKE 1 TABLET BY MOUTH DAILY 90 tablet 0   methocarbamol (ROBAXIN) 500 MG tablet TAKE 2 TABLETS BY MOUTH EVERY 6 HOURS ASNEEDED FOR MUSCLE SPASMS 120 tablet 5   montelukast (SINGULAIR) 10 MG tablet TAKE 1 TABLET BY MOUTH AT BEDTIME 90 tablet 0   pantoprazole (PROTONIX) 40 MG tablet TAKE 1 TABLET BY MOUTH DAILY 90 tablet 3   Saw Palmetto 450 MG CAPS Take 4 capsules by mouth daily.      sodium chloride (OCEAN) 0.65 % SOLN nasal spray Place 1 spray into both nostrils as needed for congestion. 15 mL 0   tamsulosin (FLOMAX) 0.4 MG CAPS capsule TAKE 1 CAPSULE BY MOUTH 2 TIMES DAILY 180 capsule 0   traMADol (ULTRAM) 50 MG tablet TAKE 1 TABLET BY MOUTH  EVERY 8 HOURS AS NEEDED FOR MODERATE TO SEVEREPAIN 30 tablet 0   Trolamine Salicylate (ASPERCREME EX) Apply 1 application topically as needed (for pain).     Vitamin D, Ergocalciferol, (DRISDOL) 1.25 MG (50000 UNIT) CAPS capsule Take 1 capsule (50,000 Units total) by mouth every 7 (seven) days. 12 capsule 0   Thiamine HCl (VITAMIN B-1) 250 MG tablet Take 1 tablet by mouth daily. (Patient not taking: Reported on 04/25/2021)     No current facility-administered medications for this visit.    Allergies  Allergen Reactions   Augmentin [Amoxicillin-Pot Clavulanate] Diarrhea   Demerol [Meperidine] Other (See Comments)    "goes crazy and has the strength of 10 men"   Eggs Or Egg-Derived Products Diarrhea and Nausea And Vomiting   Sulfamethoxazole Other (See Comments)    Blisters form in mouth   Statins Rash   Sulfa Antibiotics Rash   Sulfacetamide Sodium Rash    Family History  Problem Relation Age of Onset   Congestive Heart Failure Father    Heart disease Father    COPD Father    Kidney cancer Paternal Grandfather    Heart disease Brother        stents at 63 yo   Bladder Cancer Paternal Uncle    Lupus Brother    Heart failure Paternal Grandmother    Parkinsonism Neg Hx     Social History   Socioeconomic History   Marital status: Married    Spouse name: [name]   Number of children: 0   Years of education: college   Highest education level: Not on file  Occupational History   Occupation: disabled    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  Tobacco Use   Smoking status: Former    Packs/day: 2.00    Years: 20.00    Pack years: 40.00    Types: Cigarettes    Quit date: 10/09/2006    Years since quitting: 14.5   Smokeless tobacco: Never  Vaping Use   Vaping Use: Never used  Substance and Sexual Activity   Alcohol use: Yes    Alcohol/week: 1.0 standard drink    Types: 1 Cans of beer per week    Comment: rarely   Drug use: No   Sexual activity: Yes    Birth  control/protection: Surgical  Other Topics Concern   Not on file  Social History Narrative   12/16/19   From: [place]   Living:  with wife [name], since 1990   Work: [employer] one day a week      Family: mother and brothers are nearby      Enjoys: record collection with 10,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  Respiratory: Denies difficulty breathing, shortness of breath, cough or sputum production.   Cardiovascular: Denies chest pain, chest tightness, palpitations or swelling in the hands or feet.  Musculoskeletal: Patient reports chronic right upper back pain, chronic right-sided neck pain.  Denies decrease in range of motion, difficulty with gait, or joint swelling.  Skin: Patient reports intermittent rash of neck, jock itch.  Denies ulcercations.  Neurological: Denies numbness, tingling, weakness or problems with coordination.    No other specific complaints in a complete review of systems (except as listed in HPI above).  Objective:   Physical Exam  BP 126/61 (BP Location: Left Arm, Patient Position: Sitting, Cuff Size: Large)   Pulse 64   Temp (!) 97.3 F (36.3 C) (Oral)   Resp 17   Ht 6\' 2"  (1.88 m)   Wt 262 lb (118.8 kg)   SpO2 99%   BMI 33.64 kg/m  Wt Readings from Last 3 Encounters:  04/25/21 262 lb (118.8 kg)  02/22/21 262 lb 12.8 oz (119.2 kg)  08/17/20 257 lb 9.6 oz (116.8 kg)     General: Appears his stated age, obese, in NAD. Skin: Warm, dry and intact. No rashes noted on the back of the neck. HEENT: Head: normal shape and size; Eyes: sclera white and EOMs intact;  Cardiovascular: Normal rate.  Radial pulses 2+ bilaterally. Pulmonary/Chest: Normal effort. Musculoskeletal: Normal flexion, extension, rotation and lateral bending of the cervical spine.  No bony tenderness noted over the cervical spine.  Pain with palpation over the right paracervical muscles.  Normal internal and external rotation of the right shoulder.  Pain with palpation in the right subscapular area.  Strength 5/5 BUE.  Handgrips equal. Neurological: Alert and oriented. Coordination normal.    BMET    Component Value Date/Time   NA 141 06/02/2020 0431   NA 142 01/27/2018 0000   NA 138 12/26/2012 1936   K 4.0 06/02/2020 0431   K 3.9 12/26/2012 1936   CL 107 06/02/2020 0431   CL 106 12/26/2012 1936   CO2 27 06/02/2020 0431   CO2 24 12/26/2012 1936   GLUCOSE 138 (H) 06/02/2020 0431   GLUCOSE 124 (H) 12/26/2012 1936   BUN 18 06/02/2020 0431   BUN 19 01/27/2018 0000   BUN 22 (H) 12/26/2012 1936   CREATININE 1.00 06/02/2020 0431   CREATININE 1.24 09/23/2015 1440   CALCIUM 9.0 06/02/2020 0431   CALCIUM 8.4 (L) 12/26/2012 1936   GFRNONAA >60 06/02/2020 0431   GFRNONAA 58 (L) 12/26/2012 1936   GFRAA >60 06/02/2020 0431   GFRAA >  60 12/26/2012 1936    Lipid Panel     Component Value Date/Time   CHOL 157 06/10/2020 0919   TRIG 149.0 06/10/2020 0919   HDL 42.50 06/10/2020 0919   CHOLHDL 4 06/10/2020 0919   VLDL 29.8 06/10/2020 0919   LDLCALC 85 06/10/2020 0919    CBC    Component Value Date/Time   WBC 5.2 06/02/2020 0431   RBC 4.68 06/02/2020 0431   HGB 15.0 06/02/2020 0431   HGB 15.2 12/26/2012 1936   HCT 41.5 06/02/2020 0431   HCT 44.6 12/26/2012 1936   PLT 167 06/02/2020 0431   PLT 218 12/26/2012 1936   MCV 88.7 06/02/2020 0431   MCV 94 12/26/2012 1936   MCH 32.1  06/02/2020 0431   MCHC 36.1 (H) 06/02/2020 0431   RDW 12.6 06/02/2020 0431   RDW 13.3 12/26/2012 1936   LYMPHSABS 1.9 06/02/2020 0431   MONOABS 0.4 06/02/2020 0431   EOSABS 0.2 06/02/2020 0431   BASOSABS 0.1 06/02/2020 0431    Hgb A1C Lab Results  Component Value Date   HGBA1C 5.8 (H) 02/22/2021            Assessment & Plan:     Webb Silversmith, NP This visit occurred during the SARS-CoV-2 public health emergency.  Safety protocols were in place, including screening questions prior to the visit, additional usage of staff PPE, and extensive cleaning of exam room while observing appropriate contact time as indicated for disinfecting solutions.

## 2021-04-25 NOTE — Telephone Encounter (Signed)
   Notes to clinic:  Medication not assigned to a protocol, review manually   Requested Prescriptions  Pending Prescriptions Disp Refills   erythromycin with ethanol (THERAMYCIN) 2 % external solution [Pharmacy Med Name: ERYTHROMYCIN 2% TOP SOLN ML] 60 mL     Sig: APPLY TOPICALLY TWICE A DAY AS NEEDED      Off-Protocol Failed - 04/25/2021 12:37 PM      Failed - Medication not assigned to a protocol, review manually.      Passed - Valid encounter within last 12 months    Recent Outpatient Visits           Today DDD (degenerative disc disease), cervical   [place], NP   2 months ago Other fatigue   [facility] [place], [place]alie Ke[place], [place]

## 2021-04-25 NOTE — Assessment & Plan Note (Signed)
Erythromycin cream refilled today

## 2021-04-25 NOTE — Patient Instructions (Signed)
Back Exercises The following exercises strengthen the muscles that help to support the trunk and back. They also help to keep the lower back flexible. Doing these exercises can help to prevent back pain or lessen existing pain. If you have back pain or discomfort, try doing these exercises 2-3 times each day or as told by your health care provider. As your pain improves, do them once each day, but increase the number of times that you repeat the steps for each exercise (do more repetitions). To prevent the recurrence of back pain, continue to do these exercises once each day or as told by your health care provider. Do exercises exactly as told by your health care provider and adjust them as directed. It is normal to feel mild stretching, pulling, tightness, or discomfort as you do these exercises, but you should stop right away if youfeel sudden pain or your pain gets worse. Exercises Single knee to chest Repeat these steps 3-5 times for each leg: Lie on your back on a firm bed or the floor with your legs extended. Bring one knee to your chest. Your other leg should stay extended and in contact with the floor. Hold your knee in place by grabbing your knee or thigh with both hands and hold. Pull on your knee until you feel a gentle stretch in your lower back or buttocks. Hold the stretch for 10-30 seconds. Slowly release and straighten your leg. Pelvic tilt Repeat these steps 5-10 times: Lie on your back on a firm bed or the floor with your legs extended. Bend your knees so they are pointing toward the ceiling and your feet are flat on the floor. Tighten your lower abdominal muscles to press your lower back against the floor. This motion will tilt your pelvis so your tailbone points up toward the ceiling instead of pointing to your feet or the floor. With gentle tension and even breathing, hold this position for 5-10 seconds. Cat-cow Repeat these steps until your lower back becomes more  flexible: Get into a hands-and-knees position on a firm surface. Keep your hands under your shoulders, and keep your knees under your hips. You may place padding under your knees for comfort. Let your head hang down toward your chest. Contract your abdominal muscles and point your tailbone toward the floor so your lower back becomes rounded like the back of a cat. Hold this position for 5 seconds. Slowly lift your head, let your abdominal muscles relax and point your tailbone up toward the ceiling so your back forms a sagging arch like the back of a cow. Hold this position for 5 seconds.  Press-ups Repeat these steps 5-10 times: Lie on your abdomen (face-down) on the floor. Place your palms near your head, about shoulder-width apart. Keeping your back as relaxed as possible and keeping your hips on the floor, slowly straighten your arms to raise the top half of your body and lift your shoulders. Do not use your back muscles to raise your upper torso. You may adjust the placement of your hands to make yourself more comfortable. Hold this position for 5 seconds while you keep your back relaxed. Slowly return to lying flat on the floor.  Bridges Repeat these steps 10 times: Lie on your back on a firm surface. Bend your knees so they are pointing toward the ceiling and your feet are flat on the floor. Your arms should be flat at your sides, next to your body. Tighten your buttocks muscles and lift your   buttocks off the floor until your waist is at almost the same height as your knees. You should feel the muscles working in your buttocks and the back of your thighs. If you do not feel these muscles, slide your feet 1-2 inches farther away from your buttocks. Hold this position for 3-5 seconds. Slowly lower your hips to the starting position, and allow your buttocks muscles to relax completely. If this exercise is too easy, try doing it with your arms crossed over yourchest. Abdominal  crunches Repeat these steps 5-10 times: Lie on your back on a firm bed or the floor with your legs extended. Bend your knees so they are pointing toward the ceiling and your feet are flat on the floor. Cross your arms over your chest. Tip your chin slightly toward your chest without bending your neck. Tighten your abdominal muscles and slowly raise your trunk (torso) high enough to lift your shoulder blades a tiny bit off the floor. Avoid raising your torso higher than that because it can put too much stress on your low back and does not help to strengthen your abdominal muscles. Slowly return to your starting position. Back lifts Repeat these steps 5-10 times: Lie on your abdomen (face-down) with your arms at your sides, and rest your forehead on the floor. Tighten the muscles in your legs and your buttocks. Slowly lift your chest off the floor while you keep your hips pressed to the floor. Keep the back of your head in line with the curve in your back. Your eyes should be looking at the floor. Hold this position for 3-5 seconds. Slowly return to your starting position. Contact a health care provider if: Your back pain or discomfort gets much worse when you do an exercise. Your worsening back pain or discomfort does not lessen within 2 hours after you exercise. If you have any of these problems, stop doing these exercises right away. Do not do them again unless your health care provider says that you can. Get help right away if: You develop sudden, severe back pain. If this happens, stop doing the exercises right away. Do not do them again unless your health care provider says that you can. This information is not intended to replace advice given to you by your health care provider. Make sure you discuss any questions you have with your healthcare provider. Document Revised: 01/30/2019 Document Reviewed: 06/27/2018 Elsevier Patient Education  [employer].

## 2021-04-25 NOTE — Assessment & Plan Note (Signed)
Advised him we could try oral antifungal He reports he has had issues with elevated liver enzymes in the past, recent liver enzymes normal We will continue oral Clotrimazole and Goldbond medicated powder at this time Encouraged him to keep the area as dry as possible, avoid friction

## 2021-04-25 NOTE — Assessment & Plan Note (Signed)
Encourage diet and exercise for weight loss 

## 2021-04-27 ENCOUNTER — Other Ambulatory Visit: Payer: Self-pay

## 2021-05-01 ENCOUNTER — Encounter: Payer: Self-pay | Admitting: Internal Medicine

## 2021-05-02 ENCOUNTER — Other Ambulatory Visit: Payer: Self-pay | Admitting: Internal Medicine

## 2021-05-02 DIAGNOSIS — R3911 Hesitancy of micturition: Secondary | ICD-10-CM

## 2021-05-02 MED ORDER — GABAPENTIN 800 MG PO TABS: 800.0000 mg | ORAL_TABLET | Freq: Four times a day (QID) | ORAL | 0 refills | Status: DC

## 2021-05-05 MED ORDER — MONTELUKAST SODIUM 10 MG PO TABS: 10.0000 mg | ORAL_TABLET | Freq: Every day | ORAL | 0 refills | Status: DC

## 2021-05-05 MED ORDER — GABAPENTIN 800 MG PO TABS: 800.0000 mg | ORAL_TABLET | Freq: Four times a day (QID) | ORAL | 0 refills | Status: DC

## 2021-05-05 MED ORDER — BUPROPION HCL ER (XL) 300 MG PO TB24: 300.0000 mg | ORAL_TABLET | Freq: Every day | ORAL | 0 refills | Status: DC

## 2021-05-05 MED ORDER — TAMSULOSIN HCL 0.4 MG PO CAPS: 0.4000 mg | ORAL_CAPSULE | Freq: Two times a day (BID) | ORAL | 0 refills | Status: DC

## 2021-05-11 ENCOUNTER — Encounter: Payer: Self-pay | Admitting: Internal Medicine

## 2021-05-11 ENCOUNTER — Telehealth: Payer: Self-pay | Admitting: Clinical

## 2021-05-11 NOTE — Telephone Encounter (Signed)
Patient called [facility]) requesting an appointment with LCSW for mental health counseling. CSW not able to see patient as he is not a patient of our clinic. His primary care is at [facility]. Referred patient to his PCP and sent message to PCP requesting assistance with referral for mental health.   Estanislado Emms, [facility] [phone]

## 2021-05-12 ENCOUNTER — Other Ambulatory Visit: Payer: Self-pay | Admitting: Internal Medicine

## 2021-05-12 DIAGNOSIS — K219 Gastro-esophageal reflux disease without esophagitis: Secondary | ICD-10-CM

## 2021-05-12 DIAGNOSIS — F4323 Adjustment disorder with mixed anxiety and depressed mood: Secondary | ICD-10-CM

## 2021-05-12 DIAGNOSIS — F4311 Post-traumatic stress disorder, acute: Secondary | ICD-10-CM

## 2021-05-12 DIAGNOSIS — D508 Other iron deficiency anemias: Secondary | ICD-10-CM

## 2021-05-12 DIAGNOSIS — I10 Essential (primary) hypertension: Secondary | ICD-10-CM

## 2021-05-12 DIAGNOSIS — E119 Type 2 diabetes mellitus without complications: Secondary | ICD-10-CM

## 2021-05-12 DIAGNOSIS — E038 Other specified hypothyroidism: Secondary | ICD-10-CM

## 2021-05-12 DIAGNOSIS — G4733 Obstructive sleep apnea (adult) (pediatric): Secondary | ICD-10-CM

## 2021-05-12 DIAGNOSIS — E782 Mixed hyperlipidemia: Secondary | ICD-10-CM

## 2021-05-16 ENCOUNTER — Encounter: Payer: Self-pay | Admitting: Internal Medicine

## 2021-05-16 ENCOUNTER — Telehealth: Payer: Self-pay

## 2021-05-16 NOTE — Chronic Care Management (AMB) (Signed)
  Chronic Care Management   Note  05/16/2021 Name: Corey Bell MRN: [id number] DOB: 05-17-1966  Corey Bell is a 55 y.o. year old male who is a primary care patient of Jearld Fenton, NP. I reached out to Mirian Capuchin by phone today in response to a referral sent by Corey Bell Corey Bell's PCP, Jearld Fenton, NP      Corey Bell was given information about Chronic Care Management services today including:  CCM service includes personalized support from designated clinical staff supervised by his physician, including individualized plan of care and coordination with other care providers 24/7 contact phone numbers for assistance for urgent and routine care needs. Service will only be billed when office clinical staff spend 20 minutes or more in a month to coordinate care. Only one practitioner may furnish and bill the service in a calendar month. The patient may stop CCM services at any time (effective at the end of the month) by phone call to the office staff. The patient will be responsible for cost sharing (co-pay) of up to 20% of the service fee (after annual deductible is met).  Patient agreed to services and verbal consent obtained.   Follow up plan: Telephone appointment with care management team member scheduled for:05/24/2021  Noreene Larsson, [place], [place], [place] [address] Direct Dial: [phone] [email or web address]

## 2021-05-20 DIAGNOSIS — L648 Other androgenic alopecia: Secondary | ICD-10-CM | POA: Diagnosis not present

## 2021-05-20 DIAGNOSIS — L821 Other seborrheic keratosis: Secondary | ICD-10-CM | POA: Diagnosis not present

## 2021-05-20 DIAGNOSIS — D225 Melanocytic nevi of trunk: Secondary | ICD-10-CM | POA: Diagnosis not present

## 2021-05-20 DIAGNOSIS — L304 Erythema intertrigo: Secondary | ICD-10-CM | POA: Diagnosis not present

## 2021-05-20 DIAGNOSIS — L218 Other seborrheic dermatitis: Secondary | ICD-10-CM | POA: Diagnosis not present

## 2021-05-24 ENCOUNTER — Ambulatory Visit (INDEPENDENT_AMBULATORY_CARE_PROVIDER_SITE_OTHER): Payer: Medicare HMO | Admitting: Licensed Clinical Social Worker

## 2021-05-24 DIAGNOSIS — I10 Essential (primary) hypertension: Secondary | ICD-10-CM | POA: Diagnosis not present

## 2021-05-24 DIAGNOSIS — F4311 Post-traumatic stress disorder, acute: Secondary | ICD-10-CM

## 2021-05-24 DIAGNOSIS — F4323 Adjustment disorder with mixed anxiety and depressed mood: Secondary | ICD-10-CM

## 2021-05-27 NOTE — Chronic Care Management (AMB) (Signed)
Chronic Care Management    Clinical Social Work Note  05/27/2021 Name: Corey Bell MRN: [id number] DOB: 1966-02-23  Corey Bell is a 55 y.o. year old male who is a primary care patient of Jearld Fenton, NP. The CCM team was consulted to assist the patient with chronic disease management and/or care coordination needs related to: Mental Health Counseling and Resources and Grief Counseling.   Engaged with patient by telephone for initial visit in response to provider referral for social work chronic care management and care coordination services.   Consent to Services:  The patient was given the following information about Chronic Care Management services today, agreed to services, and gave verbal consent: 1. CCM service includes personalized support from designated clinical staff supervised by the primary care provider, including individualized plan of care and coordination with other care providers 2. 24/7 contact phone numbers for assistance for urgent and routine care needs. 3. Service will only be billed when office clinical staff spend 20 minutes or more in a month to coordinate care. 4. Only one practitioner may furnish and bill the service in a calendar month. 5.The patient may stop CCM services at any time (effective at the end of the month) by phone call to the office staff. 6. The patient will be responsible for cost sharing (co-pay) of up to 20% of the service fee (after annual deductible is met). Patient agreed to services and consent obtained.  Patient agreed to services and consent obtained.   Consent to Services:  The patient was given information about Care Management services, agreed to services, and gave verbal consent prior to initiation of services.  Please Bell initial visit note for detailed documentation.   Patient agreed to services today and consent obtained.   Assessment: Engaged with patient by phone in response to provider referral for social work care  coordination services: [place] and Resources and Grief Counseling.    Patient is currently experiencing symptoms of  depression and anxiety which seems to be exacerbated by passing of mother and strained relationships with siblings. Healthy coping skills were identified and patient is open to therapy to assist with managing symptoms. Bell Care Plan below for interventions and patient self-care actives.  Recent life changes or stressors: Grief and strained relationships with family  Recommendation: Patient may benefit from, and is in agreement work with Corey Bell to address care coordination needs and will continue to work with the clinical team to address health care and disease management related needs.   Follow up Plan: Patient would like continued follow-up from CCM Corey Bell .  per patient's request will follow up in 06/14/21.  Will call office if needed prior to next encounter.  SDOH (Social Determinants of Health) assessments and interventions performed:  SDOH Interventions    Flowsheet Row Most Recent Value  SDOH Interventions   Food Insecurity Interventions Intervention Not Indicated  Housing Interventions Intervention Not Indicated  Transportation Interventions Intervention Not Indicated        Advanced Directives Status: Not addressed in this encounter.  CCM Care Plan  Allergies  Allergen Reactions   Augmentin [Amoxicillin-Pot Clavulanate] Diarrhea   Demerol [Meperidine] Other (Bell Comments)    "goes crazy and has the strength of 10 men"   Eggs Or Egg-Derived Products Diarrhea and Nausea And Vomiting   Sulfamethoxazole Other (Bell Comments)    Blisters form in mouth   Statins Rash   Sulfa Antibiotics Rash   Sulfacetamide Sodium Rash    Outpatient Encounter  Medications as of 05/24/2021  Medication Sig   albuterol (VENTOLIN HFA) 108 (90 Base) MCG/ACT inhaler Inhale 2 puffs into the lungs every 6 (six) hours as needed for wheezing or shortness of breath.    buPROPion (WELLBUTRIN XL) 300 MG 24 hr tablet Take 1 tablet (300 mg total) by mouth daily.   buPROPion (WELLBUTRIN XL) 300 MG 24 hr tablet TAKE 1 TABLET BY MOUTH DAILY   calcium carbonate (TUMS - DOSED IN MG ELEMENTAL CALCIUM) 500 MG chewable tablet Chew 3 tablets by mouth daily.   citalopram (CELEXA) 40 MG tablet TAKE 1 TABLET BY MOUTH DAILY   clonazePAM (KLONOPIN) 0.5 MG tablet Take 1 tablet (0.5 mg total) by mouth daily as needed for anxiety.   cyclobenzaprine (FLEXERIL) 10 MG tablet Take 1 tablet (10 mg total) by mouth at bedtime.   diphenhydrAMINE (BENADRYL) 25 mg capsule Take 50 mg by mouth at bedtime.   Erythromycin 2 % PADS Apply 1 application topically daily.   erythromycin with ethanol (THERAMYCIN) 2 % external solution APPLY TOPICALLY TWICE A DAY AS NEEDED   ezetimibe (ZETIA) 10 MG tablet TAKE 1 TABLET BY MOUTH DAILY   Ferrous Sulfate (IRON) 325 (65 Fe) MG TABS Take by mouth daily.    fexofenadine (ALLEGRA) 180 MG tablet Take 180 mg by mouth daily.   fluticasone (FLONASE) 50 MCG/ACT nasal spray Place 1 spray into both nostrils 2 (two) times daily.   gabapentin (NEURONTIN) 800 MG tablet Take 1 tablet (800 mg total) by mouth 4 (four) times daily.   gabapentin (NEURONTIN) 800 MG tablet Take 1 tablet (800 mg total) by mouth 4 (four) times daily.   hydrOXYzine (VISTARIL) 25 MG capsule Take 1 capsule (25 mg total) by mouth every 8 (eight) hours as needed.   levothyroxine (SYNTHROID) 75 MCG tablet TAKE 1 TABLET BY MOUTH DAILY   methocarbamol (ROBAXIN) 500 MG tablet TAKE 2 TABLETS BY MOUTH EVERY 6 HOURS ASNEEDED FOR MUSCLE SPASMS   montelukast (SINGULAIR) 10 MG tablet Take 1 tablet (10 mg total) by mouth at bedtime.   montelukast (SINGULAIR) 10 MG tablet TAKE 1 TABLET BY MOUTH AT BEDTIME   pantoprazole (PROTONIX) 40 MG tablet TAKE 1 TABLET BY MOUTH DAILY   Saw Palmetto 450 MG CAPS Take 4 capsules by mouth daily.    sodium chloride (OCEAN) 0.65 % SOLN nasal spray Place 1 spray into both  nostrils as needed for congestion.   tamsulosin (FLOMAX) 0.4 MG CAPS capsule Take 1 capsule (0.4 mg total) by mouth 2 (two) times daily.   tamsulosin (FLOMAX) 0.4 MG CAPS capsule TAKE 1 CAPSULE BY MOUTH 2 TIMES DAILY   traMADol (ULTRAM) 50 MG tablet TAKE 1 TABLET BY MOUTH EVERY 8 HOURS AS NEEDED FOR MODERATE TO SEVEREPAIN   Trolamine Salicylate (ASPERCREME EX) Apply 1 application topically as needed (for pain).   Vitamin D, Ergocalciferol, (DRISDOL) 1.25 MG (50000 UNIT) CAPS capsule Take 1 capsule (50,000 Units total) by mouth every 7 (seven) days.   No facility-administered encounter medications on file as of 05/24/2021.    Patient Active Problem List   Diagnosis Date Noted   Chronic upper back pain 04/25/2021   Candidal intertrigo 04/25/2021   Bacterial skin infection 04/25/2021   Class 1 obesity due to excess calories with serious comorbidity and body mass index (BMI) of 33.0 to 33.9 in adult 04/25/2021   BPH (benign prostatic hyperplasia) 03/02/2020   Iron deficiency anemia 03/02/2020   Adjustment reaction with anxiety and depression 03/06/2019   Post-traumatic stress disorder,  acute 01/27/2019   Diabetes mellitus type 2, diet-controlled ([place]) 07/31/2017   GERD (gastroesophageal reflux disease) 04/07/2015   HLD (hyperlipidemia) 06/03/2014   DDD (degenerative disc disease), cervical 10/13/2013   DDD (degenerative disc disease), lumbosacral 10/13/2013   OSA (obstructive sleep apnea) 07/01/2013   Essential hypertension 05/29/2013   Hypothyroidism     Conditions to be addressed/monitored: Anxiety and Depression; Mental Health Concerns  and Family and relationship dysfunction Grief  Care Plan : General Social Work (Adult)  Updates made by Corey Chesterfield, Corey Bell since 05/27/2021 12:00 AM     Problem: Coping Skills (General Plan of Care)      Goal: Coping Skills Enhanced   Start Date: 05/24/2021  This Visit's Progress: On track  Priority: High  Note:   Current barriers:   Acute  Mental Health needs related to anxiety and depression Limited social support, Mental Health Concerns , and Family and relationship dysfunction Needs Support, Education, and Care Coordination in order to meet unmet mental health needs. Clinical Goal(s): demonstrate a reduction in symptoms related to :Adjustment disorder, Anxiety , Depression, and Grief   Clinical Interventions:  1:1 collaboration with primary care provider regarding development and update of comprehensive plan of care as evidenced by provider attestation and co-signature Inter-disciplinary care team collaboration (Bell longitudinal plan of care) Assessed patient's previous and current treatment, coping skills, support system and barriers to care  Patient reports difficulty managing stress triggered by mother's recent passing May 2022. Patient and spouse were very involved in her care prior to her passing Patient shared that he has not received multiple family heirlooms that mother awarded him, which has significantly strained relationship with stepbrothers Patient endorses racing thoughts, difficulty relaxing, and irritability. CCM Corey Bell provided a safe environment for patient to process feelings of anger  Patient works one day out of the week Patient reports emotional support from spouse and has hired an Forensic psychologist to strengthen support system regarding legal claims States interest in learning tools to cope with stressors and is open to referral to therapy. He prefers to speak with a male therapist and is open to telehealth and traveling to [place], [place] Depression screen reviewed , Solution-Focused Strategies, Active listening / Reflection utilized , Emotional Supportive Provided, Provided psychoeducation for mental health needs , Participation in counseling encouraged , Verbalization of feelings encouraged , Crisis Resource Education / information provided , and Suicidal Ideation/Homicidal Ideation assessed: ; Patient Goals/Self-Care  Activities: Over the next 120 days - participation in mental health treatment encouraged - self-awareness of emotional triggers encouraged - strategies to manage emotional triggers promoted - talk about feelings with a friend, family or spiritual advisor Contact clinic with any questions or concerns Attend scheduled appointments with providers         Corey Bell, Corey Bell, [email or web address] Phone [phone] 11:19 PM

## 2021-05-27 NOTE — Patient Instructions (Signed)
Visit Information   Goals Addressed             This Visit's Progress    Manage My Emotions   On track    Timeframe:  Long-Range Goal Priority:  High Start Date:           05/24/21                  Expected End Date:                       Follow Up Date 06/14/21   Patient Goals/Self-Care Activities: Over the next 120 days - participation in mental health treatment encouraged - self-awareness of emotional triggers encouraged - strategies to manage emotional triggers promoted - talk about feelings with a friend, family or spiritual advisor Contact clinic with any questions or concerns Attend scheduled appointments with providers        Patient verbalizes understanding of instructions provided today and agrees to view in [place].   Telephone follow up appointment with care management team member scheduled for:06/14/21  Christa See, MSW, [email or web address] Phone [phone] 11:22 PM

## 2021-06-04 ENCOUNTER — Other Ambulatory Visit: Payer: Self-pay | Admitting: Internal Medicine

## 2021-06-04 NOTE — Telephone Encounter (Signed)
Requested Prescriptions  Pending Prescriptions Disp Refills  . levothyroxine (SYNTHROID) 75 MCG tablet [Pharmacy Med Name: LEVOTHYROXINE SODIUM 75 MCG TAB] 90 tablet 2    Sig: TAKE 1 TABLET BY MOUTH DAILY     Endocrinology:  Hypothyroid Agents Failed - 06/04/2021 11:59 AM      Failed - TSH needs to be rechecked within 3 months after an abnormal result. Refill until TSH is due.      Passed - TSH in normal range and within 360 days    TSH  Date Value Ref Range Status  02/22/2021 1.24 0.40 - 4.50 mIU/L Final         Passed - Valid encounter within last 12 months    Recent Outpatient Visits          1 month ago DDD (degenerative disc disease), cervical   [place], NP   3 months ago Other fatigue   [facility], [place], [place]

## 2021-06-07 ENCOUNTER — Encounter: Payer: Self-pay | Admitting: Internal Medicine

## 2021-06-14 ENCOUNTER — Other Ambulatory Visit: Payer: Self-pay | Admitting: Internal Medicine

## 2021-06-14 ENCOUNTER — Ambulatory Visit (INDEPENDENT_AMBULATORY_CARE_PROVIDER_SITE_OTHER): Payer: Medicare HMO | Admitting: Licensed Clinical Social Worker

## 2021-06-14 DIAGNOSIS — F4323 Adjustment disorder with mixed anxiety and depressed mood: Secondary | ICD-10-CM

## 2021-06-14 DIAGNOSIS — F4311 Post-traumatic stress disorder, acute: Secondary | ICD-10-CM

## 2021-06-14 DIAGNOSIS — M5030 Other cervical disc degeneration, unspecified cervical region: Secondary | ICD-10-CM

## 2021-06-14 DIAGNOSIS — I10 Essential (primary) hypertension: Secondary | ICD-10-CM

## 2021-06-14 NOTE — Chronic Care Management (AMB) (Signed)
Chronic Care Management    Clinical Social Work Note  06/14/2021 Name: Corey Bell MRN: [id number] DOB: [birth year]  Corey Bell is a 55 y.o. year old male who is a primary care patient of Jearld Fenton, NP. The CCM team was consulted to assist the patient with chronic disease management and/or care coordination needs related to: Mental Health Counseling and Resources and Grief Counseling.   Engaged with patient by telephone for follow up visit in response to provider referral for social work chronic care management and care coordination services.   Consent to Services:  The patient was given information about Chronic Care Management services, agreed to services, and gave verbal consent prior to initiation of services.  Please see initial visit note for detailed documentation.   Patient agreed to services and consent obtained.   Consent to Services:  The patient was given information about Care Management services, agreed to services, and gave verbal consent prior to initiation of services.  Please see initial visit note for detailed documentation.   Patient agreed to services today and consent obtained.   Assessment: Engaged with patient by phone in response to provider referral for social work care coordination services: [place] and Resources and Grief Counseling.    Patient continues to maintain positive progress with care plan goals. He reports continued difficulty managing mental health symptoms and experienced a negative experience with CMA while attempting to obtain information (see in MyChart) CCM LCSW provided emotional support. Patient is requesting a referral to [facility]. See Care Plan below for interventions and patient self-care activities.  Recent life changes or stressors: Grief  Recommendation: Patient may benefit from, and is in agreement work with LCSW to address care coordination needs and will continue to work with the  clinical team to address health care and disease management related needs.   Follow up Plan: Patient would like continued follow-up from CCM LCSW .  per patient's request will follow up in 06/28/21.  Will call office if needed prior to next encounter.   SDOH (Social Determinants of Health) assessments and interventions performed:  NA  Advanced Directives Status: Not addressed in this encounter.  CCM Care Plan  Allergies  Allergen Reactions   Augmentin [Amoxicillin-Pot Clavulanate] Diarrhea   Demerol [Meperidine] Other (See Comments)    "goes crazy and has the strength of 10 men"   Eggs Or Egg-Derived Products Diarrhea and Nausea And Vomiting   Sulfamethoxazole Other (See Comments)    Blisters form in mouth   Statins Rash   Sulfa Antibiotics Rash   Sulfacetamide Sodium Rash    Outpatient Encounter Medications as of 06/14/2021  Medication Sig   albuterol (VENTOLIN HFA) 108 (90 Base) MCG/ACT inhaler Inhale 2 puffs into the lungs every 6 (six) hours as needed for wheezing or shortness of breath.   buPROPion (WELLBUTRIN XL) 300 MG 24 hr tablet Take 1 tablet (300 mg total) by mouth daily.   buPROPion (WELLBUTRIN XL) 300 MG 24 hr tablet TAKE 1 TABLET BY MOUTH DAILY   calcium carbonate (TUMS - DOSED IN MG ELEMENTAL CALCIUM) 500 MG chewable tablet Chew 3 tablets by mouth daily.   citalopram (CELEXA) 40 MG tablet TAKE 1 TABLET BY MOUTH DAILY   clonazePAM (KLONOPIN) 0.5 MG tablet Take 1 tablet (0.5 mg total) by mouth daily as needed for anxiety.   cyclobenzaprine (FLEXERIL) 10 MG tablet Take 1 tablet (10 mg total) by mouth at bedtime.   diphenhydrAMINE (BENADRYL) 25 mg capsule Take  50 mg by mouth at bedtime.   Erythromycin 2 % PADS Apply 1 application topically daily.   erythromycin with ethanol (THERAMYCIN) 2 % external solution APPLY TOPICALLY TWICE A DAY AS NEEDED   ezetimibe (ZETIA) 10 MG tablet TAKE 1 TABLET BY MOUTH DAILY   Ferrous Sulfate (IRON) 325 (65 Fe) MG TABS Take by mouth daily.     fexofenadine (ALLEGRA) 180 MG tablet Take 180 mg by mouth daily.   fluticasone (FLONASE) 50 MCG/ACT nasal spray Place 1 spray into both nostrils 2 (two) times daily.   gabapentin (NEURONTIN) 800 MG tablet Take 1 tablet (800 mg total) by mouth 4 (four) times daily.   gabapentin (NEURONTIN) 800 MG tablet Take 1 tablet (800 mg total) by mouth 4 (four) times daily.   hydrOXYzine (VISTARIL) 25 MG capsule Take 1 capsule (25 mg total) by mouth every 8 (eight) hours as needed.   levothyroxine (SYNTHROID) 75 MCG tablet TAKE 1 TABLET BY MOUTH DAILY   methocarbamol (ROBAXIN) 500 MG tablet TAKE 2 TABLETS BY MOUTH EVERY 6 HOURS ASNEEDED FOR MUSCLE SPASMS   montelukast (SINGULAIR) 10 MG tablet Take 1 tablet (10 mg total) by mouth at bedtime.   montelukast (SINGULAIR) 10 MG tablet TAKE 1 TABLET BY MOUTH AT BEDTIME   pantoprazole (PROTONIX) 40 MG tablet TAKE 1 TABLET BY MOUTH DAILY   Saw Palmetto 450 MG CAPS Take 4 capsules by mouth daily.    sodium chloride (OCEAN) 0.65 % SOLN nasal spray Place 1 spray into both nostrils as needed for congestion.   tamsulosin (FLOMAX) 0.4 MG CAPS capsule Take 1 capsule (0.4 mg total) by mouth 2 (two) times daily.   tamsulosin (FLOMAX) 0.4 MG CAPS capsule TAKE 1 CAPSULE BY MOUTH 2 TIMES DAILY   traMADol (ULTRAM) 50 MG tablet TAKE 1 TABLET BY MOUTH EVERY 8 HOURS AS NEEDED FOR MODERATE TO SEVEREPAIN   Trolamine Salicylate (ASPERCREME EX) Apply 1 application topically as needed (for pain).   Vitamin D, Ergocalciferol, (DRISDOL) 1.25 MG (50000 UNIT) CAPS capsule Take 1 capsule (50,000 Units total) by mouth every 7 (seven) days.   No facility-administered encounter medications on file as of 06/14/2021.    Patient Active Problem List   Diagnosis Date Noted   Chronic upper back pain 04/25/2021   Candidal intertrigo 04/25/2021   Bacterial skin infection 04/25/2021   Class 1 obesity due to excess calories with serious comorbidity and body mass index (BMI) of 33.0 to 33.9 in adult  04/25/2021   BPH (benign prostatic hyperplasia) 03/02/2020   Iron deficiency anemia 03/02/2020   Adjustment reaction with anxiety and depression 03/06/2019   Post-traumatic stress disorder, acute 01/27/2019   Diabetes mellitus type 2, diet-controlled ([place]) 07/31/2017   GERD (gastroesophageal reflux disease) 04/07/2015   HLD (hyperlipidemia) 06/03/2014   DDD (degenerative disc disease), cervical 10/13/2013   DDD (degenerative disc disease), lumbosacral 10/13/2013   OSA (obstructive sleep apnea) 07/01/2013   Essential hypertension 05/29/2013   Hypothyroidism     Conditions to be addressed/monitored: Anxiety, Depression, and Grief, PTSD ; Mental Health Concerns   Care Plan : General Social Work (Adult)  Updates made by Rebekah Chesterfield, LCSW since 06/14/2021 12:00 AM     Problem: Coping Skills (General Plan of Care)      Goal: Coping Skills Enhanced   Start Date: 05/24/2021  This Visit's Progress: On track  Recent Progress: On track  Priority: High  Note:   Current barriers:   Acute Mental Health needs related to anxiety and depression Limited  social support, Mental Health Concerns , and Family and relationship dysfunction Needs Support, Education, and Care Coordination in order to meet unmet mental health needs. Clinical Goal(s): demonstrate a reduction in symptoms related to :Adjustment disorder, Anxiety , Depression, and Grief   Clinical Interventions:  1:1 collaboration with primary care provider regarding development and update of comprehensive plan of care as evidenced by provider attestation and co-signature Inter-disciplinary care team collaboration (see longitudinal plan of care) Assessed patient's previous and current treatment, coping skills, support system and barriers to care  Patient reports difficulty managing stress triggered by mother's recent passing May 2022. Patient and spouse were very involved in her care prior to her passing 09/06:  Patient reports increase in  anxiety and is interested in strengthening support to cope with symptoms. Patient is highly motivated to initiate counseling Patient shared that he has not received multiple family heirlooms that mother awarded him, which has significantly strained relationship with stepbrothers 09/06: Patient reports that he has decreased communication with step-brothers, in attempt, to better manage stress Patient endorses racing thoughts, difficulty relaxing, and irritability. CCM LCSW provided a safe environment for patient to process feelings of anger  Patient works one day out of the week Patient reports emotional support from spouse and has hired an Forensic psychologist to strengthen support system regarding legal claims States interest in learning tools to cope with stressors and is open to referral to therapy. He prefers to speak with a male therapist and is open to telehealth and traveling to [place], [place] 09/06: Patient was successful in identifying healthy coping skills (working on car) Patient verbalized consent for a referral to [facility]. CCM LCSW will collaborate with PCP regarding referral Patient reports frustration with treatment of CMA through MyChart regarding questions of upcoming appt. CCM LCSW will Special educational needs teacher and PCP of patient's negative experience Depression screen reviewed , Solution-Focused Strategies, Active listening / Reflection utilized , Emotional Supportive Provided, Provided psychoeducation for mental health needs , Participation in counseling encouraged , Verbalization of feelings encouraged , Crisis Resource Education / information provided , and Suicidal Ideation/Homicidal Ideation assessed: ; Patient Goals/Self-Care Activities: Over the next 120 days - participation in mental health treatment encouraged - self-awareness of emotional triggers encouraged - strategies to manage emotional triggers promoted - talk about feelings with a friend, family or spiritual advisor Contact  clinic with any questions or concerns Attend scheduled appointments with providers           Christa See, MSW, [email or web address] Phone [phone] 10:07 AM

## 2021-06-14 NOTE — Patient Instructions (Signed)
Visit Information   Goals Addressed             This Visit's Progress    Manage My Emotions   On track    Timeframe:  Long-Range Goal Priority:  High Start Date:           06/24/21                  Expected End Date:  07/08/21                     Follow Up Date 06/28/21   Patient Goals/Self-Care Activities: Over the next 120 days - participation in mental health treatment encouraged - self-awareness of emotional triggers encouraged - strategies to manage emotional triggers promoted - talk about feelings with a friend, family or spiritual advisor Contact clinic with any questions or concerns Attend scheduled appointments with providers        Patient verbalizes understanding of instructions provided today and agrees to view in [place].   Telephone follow up appointment with care management team member scheduled for: 06/28/21  Christa See, MSW, [email or web address] Phone [phone] 10:30 AM

## 2021-06-28 ENCOUNTER — Ambulatory Visit: Payer: Medicare HMO | Admitting: Licensed Clinical Social Worker

## 2021-06-28 DIAGNOSIS — E782 Mixed hyperlipidemia: Secondary | ICD-10-CM

## 2021-06-28 DIAGNOSIS — E038 Other specified hypothyroidism: Secondary | ICD-10-CM

## 2021-06-28 DIAGNOSIS — F4311 Post-traumatic stress disorder, acute: Secondary | ICD-10-CM

## 2021-06-28 DIAGNOSIS — E119 Type 2 diabetes mellitus without complications: Secondary | ICD-10-CM

## 2021-06-28 DIAGNOSIS — I10 Essential (primary) hypertension: Secondary | ICD-10-CM

## 2021-06-28 DIAGNOSIS — F4323 Adjustment disorder with mixed anxiety and depressed mood: Secondary | ICD-10-CM

## 2021-06-28 NOTE — Chronic Care Management (AMB) (Signed)
Chronic Care Management    Clinical Social Work Note  06/28/2021 Name: IRENE MITCHAM MRN: [id number] DOB: [birth year]  LESLY JOSLYN is a 55 y.o. year old male who is a primary care patient of Jearld Fenton, NP. The CCM team was consulted to assist the patient with chronic disease management and/or care coordination needs related to: Mental Health Counseling and Resources.   Engaged with patient by telephone for follow up visit in response to provider referral for social work chronic care management and care coordination services.   Consent to Services:  The patient was given information about Chronic Care Management services, agreed to services, and gave verbal consent prior to initiation of services.  Please see initial visit note for detailed documentation.   Patient agreed to services and consent obtained.   Consent to Services:  The patient was given information about Care Management services, agreed to services, and gave verbal consent prior to initiation of services.  Please see initial visit note for detailed documentation.   Patient agreed to services today and consent obtained.   Assessment: Engaged with patient by phone in response to provider referral for social work care coordination services: [place] and Resources.    Patient continues to maintain positive progress with care plan goals. He has an upcoming appt scheduled with [facility] and reports decrease usage of PRN medications to assist with anxiety symptoms. See Care Plan below for interventions and patient self-care actives.  Recent life changes or stressors: Management of MH symptoms, pending legal case, grief  Recommendation: Patient may benefit from, and is in agreement work with LCSW to address care coordination needs and will continue to work with the clinical team to address health care and disease management related needs.   Follow up Plan: Patient would like continued follow-up from CCM LCSW .   per patient's request will follow up in 08/02/21.  Will call office if needed prior to next encounter.     SDOH (Social Determinants of Health) assessments and interventions performed:  NA  Advanced Directives Status: Not addressed in this encounter.  CCM Care Plan  Allergies  Allergen Reactions   Augmentin [Amoxicillin-Pot Clavulanate] Diarrhea   Demerol [Meperidine] Other (See Comments)    "goes crazy and has the strength of 10 men"   Eggs Or Egg-Derived Products Diarrhea and Nausea And Vomiting   Sulfamethoxazole Other (See Comments)    Blisters form in mouth   Statins Rash   Sulfa Antibiotics Rash   Sulfacetamide Sodium Rash    Outpatient Encounter Medications as of 06/28/2021  Medication Sig   albuterol (VENTOLIN HFA) 108 (90 Base) MCG/ACT inhaler Inhale 2 puffs into the lungs every 6 (six) hours as needed for wheezing or shortness of breath.   buPROPion (WELLBUTRIN XL) 300 MG 24 hr tablet Take 1 tablet (300 mg total) by mouth daily.   buPROPion (WELLBUTRIN XL) 300 MG 24 hr tablet TAKE 1 TABLET BY MOUTH DAILY   calcium carbonate (TUMS - DOSED IN MG ELEMENTAL CALCIUM) 500 MG chewable tablet Chew 3 tablets by mouth daily.   citalopram (CELEXA) 40 MG tablet TAKE 1 TABLET BY MOUTH DAILY   clonazePAM (KLONOPIN) 0.5 MG tablet Take 1 tablet (0.5 mg total) by mouth daily as needed for anxiety.   cyclobenzaprine (FLEXERIL) 10 MG tablet Take 1 tablet (10 mg total) by mouth at bedtime.   diphenhydrAMINE (BENADRYL) 25 mg capsule Take 50 mg by mouth at bedtime.   Erythromycin 2 % PADS Apply 1 application  topically daily.   erythromycin with ethanol (THERAMYCIN) 2 % external solution APPLY TOPICALLY TWICE A DAY AS NEEDED   ezetimibe (ZETIA) 10 MG tablet TAKE 1 TABLET BY MOUTH DAILY   Ferrous Sulfate (IRON) 325 (65 Fe) MG TABS Take by mouth daily.    fexofenadine (ALLEGRA) 180 MG tablet Take 180 mg by mouth daily.   fluticasone (FLONASE) 50 MCG/ACT nasal spray Place 1 spray into both  nostrils 2 (two) times daily.   gabapentin (NEURONTIN) 800 MG tablet Take 1 tablet (800 mg total) by mouth 4 (four) times daily.   gabapentin (NEURONTIN) 800 MG tablet Take 1 tablet (800 mg total) by mouth 4 (four) times daily.   hydrOXYzine (VISTARIL) 25 MG capsule Take 1 capsule (25 mg total) by mouth every 8 (eight) hours as needed.   levothyroxine (SYNTHROID) 75 MCG tablet TAKE 1 TABLET BY MOUTH DAILY   methocarbamol (ROBAXIN) 500 MG tablet TAKE 2 TABLETS BY MOUTH EVERY 6 HOURS ASNEEDED FOR MUSCLE SPASMS   montelukast (SINGULAIR) 10 MG tablet Take 1 tablet (10 mg total) by mouth at bedtime.   montelukast (SINGULAIR) 10 MG tablet TAKE 1 TABLET BY MOUTH AT BEDTIME   pantoprazole (PROTONIX) 40 MG tablet TAKE 1 TABLET BY MOUTH DAILY   Saw Palmetto 450 MG CAPS Take 4 capsules by mouth daily.    sodium chloride (OCEAN) 0.65 % SOLN nasal spray Place 1 spray into both nostrils as needed for congestion.   tamsulosin (FLOMAX) 0.4 MG CAPS capsule Take 1 capsule (0.4 mg total) by mouth 2 (two) times daily.   tamsulosin (FLOMAX) 0.4 MG CAPS capsule TAKE 1 CAPSULE BY MOUTH 2 TIMES DAILY   traMADol (ULTRAM) 50 MG tablet TAKE 1 TABLET BY MOUTH EVERY 8 HOURS AS NEEDED FOR MODERATE TO SEVEREPAIN   Trolamine Salicylate (ASPERCREME EX) Apply 1 application topically as needed (for pain).   Vitamin D, Ergocalciferol, (DRISDOL) 1.25 MG (50000 UNIT) CAPS capsule Take 1 capsule (50,000 Units total) by mouth every 7 (seven) days.   No facility-administered encounter medications on file as of 06/28/2021.    Patient Active Problem List   Diagnosis Date Noted   Chronic upper back pain 04/25/2021   Candidal intertrigo 04/25/2021   Bacterial skin infection 04/25/2021   Class 1 obesity due to excess calories with serious comorbidity and body mass index (BMI) of 33.0 to 33.9 in adult 04/25/2021   BPH (benign prostatic hyperplasia) 03/02/2020   Iron deficiency anemia 03/02/2020   Adjustment reaction with anxiety and  depression 03/06/2019   Post-traumatic stress disorder, acute 01/27/2019   Diabetes mellitus type 2, diet-controlled ([place]) 07/31/2017   GERD (gastroesophageal reflux disease) 04/07/2015   HLD (hyperlipidemia) 06/03/2014   DDD (degenerative disc disease), cervical 10/13/2013   DDD (degenerative disc disease), lumbosacral 10/13/2013   OSA (obstructive sleep apnea) 07/01/2013   Essential hypertension 05/29/2013   Hypothyroidism     Conditions to be addressed/monitored: Anxiety, Depression, and PTSD ; Mental Health Concerns   Care Plan : General Social Work (Adult)  Updates made by Rebekah Chesterfield, LCSW since 06/28/2021 12:00 AM     Problem: Coping Skills (General Plan of Care)      Goal: Coping Skills Enhanced   Start Date: 05/24/2021  This Visit's Progress: On track  Recent Progress: On track  Priority: High  Note:   Current barriers:   Acute Mental Health needs related to anxiety and depression Limited social support, Mental Health Concerns , and Family and relationship dysfunction Needs Support, Education, and Care  Coordination in order to meet unmet mental health needs. Clinical Goal(s): demonstrate a reduction in symptoms related to :Adjustment disorder, Anxiety , Depression, and Grief   Clinical Interventions:  1:1 collaboration with primary care provider regarding development and update of comprehensive plan of care as evidenced by provider attestation and co-signature Inter-disciplinary care team collaboration (see longitudinal plan of care) Assessed patient's previous and current treatment, coping skills, support system and barriers to care  Patient reports difficulty managing stress triggered by mother's recent passing May 2022. Patient and spouse were very involved in her care prior to her passing 09/06:  Patient reports increase in anxiety and is interested in strengthening support to cope with symptoms. Patient is highly motivated to initiate counseling 09/20: Symptoms  fluctuate depending on the day. No report of SI/HI Patient reports decrease in PRN medications, which is improvement in him managing symptoms Patient shared that he has not received multiple family heirlooms that mother awarded him, which has significantly strained relationship with stepbrothers 09/06: Patient reports that he has decreased communication with step-brothers, in attempt, to better manage stress 09/20: Patient recently spoke with brothers; however, states that he continues to limit interactions Patient endorses racing thoughts, difficulty relaxing, and irritability. CCM LCSW provided a safe environment for patient to process feelings of anger  Patient works one day out of the week Patient reports emotional support from spouse and has hired an Forensic psychologist to strengthen support system regarding legal claims 09/20: Patient receives limited support from attorney to date; however, patient continues to follow up States interest in learning tools to cope with stressors and is open to referral to therapy. He prefers to speak with a male therapist and is open to telehealth and traveling to [place], [place] 09/06: Patient was successful in identifying healthy coping skills (working on car) Patient verbalized consent for a referral to [facility] CCM LCSW will collaborate with PCP regarding referral 09/20: Patient has an upcoming appointment with [facility] Patient reports frustration with treatment of CMA through MyChart regarding questions of upcoming appt. CCM LCSW will Special educational needs teacher and PCP of patient's negative experience CCM LCSW spoke with Surveyor, quantity at [facility]. CCM LCSW was informed that appointments are 3 weeks out and they are accepting new patients.  Depression screen reviewed , Solution-Focused Strategies, Active listening / Reflection utilized , Emotional Supportive Provided, Provided psychoeducation for mental health needs , Participation in counseling encouraged ,  Verbalization of feelings encouraged , Crisis Resource Education / information provided , and Suicidal Ideation/Homicidal Ideation assessed: ; Patient Goals/Self-Care Activities: Over the next 120 days - participation in mental health treatment encouraged - self-awareness of emotional triggers encouraged - strategies to manage emotional triggers promoted - talk about feelings with a friend, family or spiritual advisor Contact clinic with any questions or concerns Attend scheduled appointments with providers        Christa See, MSW, [email or web address] Phone [phone] 12:54 PM

## 2021-06-28 NOTE — Patient Instructions (Signed)
Visit Information   Goals Addressed             This Visit's Progress    Manage My Emotions   On track    Timeframe:  Long-Range Goal Priority:  High Start Date:           06/24/21                  Expected End Date:  10/31`/22                     Follow Up Date 08/02/21   Patient Goals/Self-Care Activities: Over the next 120 days - participation in mental health treatment encouraged - self-awareness of emotional triggers encouraged - strategies to manage emotional triggers promoted - talk about feelings with a friend, family or spiritual advisor Contact clinic with any questions or concerns Attend scheduled appointments with providers        Patient verbalizes understanding of instructions provided today and agrees to view in [place].   Telephone follow up appointment with care management team member scheduled for:08/02/21  Christa See, MSW, [email or web address] Phone [phone] 12:58 PM

## 2021-07-01 ENCOUNTER — Encounter: Payer: Self-pay | Admitting: Internal Medicine

## 2021-07-08 DIAGNOSIS — F4323 Adjustment disorder with mixed anxiety and depressed mood: Secondary | ICD-10-CM

## 2021-07-08 DIAGNOSIS — E119 Type 2 diabetes mellitus without complications: Secondary | ICD-10-CM

## 2021-07-08 DIAGNOSIS — E038 Other specified hypothyroidism: Secondary | ICD-10-CM | POA: Diagnosis not present

## 2021-07-08 DIAGNOSIS — I10 Essential (primary) hypertension: Secondary | ICD-10-CM

## 2021-07-08 DIAGNOSIS — E782 Mixed hyperlipidemia: Secondary | ICD-10-CM

## 2021-07-14 DIAGNOSIS — E119 Type 2 diabetes mellitus without complications: Secondary | ICD-10-CM | POA: Diagnosis not present

## 2021-07-14 LAB — HM DIABETES EYE EXAM

## 2021-07-18 ENCOUNTER — Other Ambulatory Visit: Payer: Self-pay | Admitting: Internal Medicine

## 2021-07-18 ENCOUNTER — Encounter: Payer: Self-pay | Admitting: Internal Medicine

## 2021-07-18 MED ORDER — CYCLOBENZAPRINE HCL 10 MG PO TABS: 10.0000 mg | ORAL_TABLET | Freq: Every day | ORAL | 5 refills | Status: DC

## 2021-07-21 ENCOUNTER — Ambulatory Visit: Payer: Self-pay | Admitting: Licensed Clinical Social Worker

## 2021-07-21 MED ORDER — CLONAZEPAM 0.5 MG PO TABS: 0.5000 mg | ORAL_TABLET | Freq: Every day | ORAL | 0 refills | Status: DC | PRN

## 2021-07-25 ENCOUNTER — Other Ambulatory Visit: Payer: Self-pay | Admitting: Internal Medicine

## 2021-07-25 DIAGNOSIS — H524 Presbyopia: Secondary | ICD-10-CM | POA: Diagnosis not present

## 2021-07-25 DIAGNOSIS — H52209 Unspecified astigmatism, unspecified eye: Secondary | ICD-10-CM | POA: Diagnosis not present

## 2021-07-25 DIAGNOSIS — H5213 Myopia, bilateral: Secondary | ICD-10-CM | POA: Diagnosis not present

## 2021-07-25 NOTE — Telephone Encounter (Signed)
Requested medication (s) are due for refill today - yes  Requested medication (s) are on the active medication list -yes  Future visit scheduled -no  Last refill: Tramadol- 03/08/21 #30                  Ezetimibe-04/25/21 #90  Notes to clinic: Request RF: non delegated Rx, fails lab protocol- last lab- 06/10/20  Requested Prescriptions  Pending Prescriptions Disp Refills   traMADol (ULTRAM) 50 MG tablet [Pharmacy Med Name: TRAMADOL HCL 50 MG TAB] 30 tablet     Sig: TAKE 1 TABLET BY MOUTH EVERY 8 HOURS AS NEEDED FOR MODERATE TO SEVERE PAIN     Not Delegated - Analgesics:  Opioid Agonists Failed - 07/25/2021  9:39 AM      Failed - This refill cannot be delegated      Failed - Urine Drug Screen completed in last 360 days      Passed - Valid encounter within last 6 months    Recent Outpatient Visits           3 months ago DDD (degenerative disc disease), cervical   [facility] [place], Coralie Keens, NP   5 months ago Other fatigue   [facility] [place], Coralie Keens, NP               ezetimibe (ZETIA) 10 MG tablet [Pharmacy Med Name: EZETIMIBE 10 MG TAB] 90 tablet 0    Sig: TAKE 1 TABLET BY MOUTH DAILY     Cardiovascular:  Antilipid - Sterol Transport Inhibitors Failed - 07/25/2021  9:39 AM      Failed - Total Cholesterol in normal range and within 360 days    Cholesterol  Date Value Ref Range Status  06/10/2020 157 0 - 200 mg/dL Final    Comment:    ATP III Classification       Desirable:  < 200 mg/dL               Borderline High:  200 - 239 mg/dL          High:  > = 240 mg/dL          Failed - LDL in normal range and within 360 days    LDL Cholesterol  Date Value Ref Range Status  06/10/2020 85 0 - 99 mg/dL Final   Direct LDL  Date Value Ref Range Status  12/16/2019 135.0 mg/dL Final    Comment:    Optimal:  <100 mg/dLNear or Above Optimal:  100-129 mg/dLBorderline High:  130-159 mg/dLHigh:  160-189 mg/dLVery High:  >190 mg/dL           Failed - HDL in normal range and within 360 days    HDL  Date Value Ref Range Status  06/10/2020 42.50 >39.00 mg/dL Final          Failed - Triglycerides in normal range and within 360 days    Triglycerides  Date Value Ref Range Status  06/10/2020 149.0 0.0 - 149.0 mg/dL Final    Comment:    Normal:  <150 mg/dLBorderline High:  150 - 199 mg/dL          Passed - Valid encounter within last 12 months    Recent Outpatient Visits           3 months ago DDD (degenerative disc disease), cervical   [place], Coralie Keens, NP   5 months ago Other fatigue   [facility] [place], Coralie Keens, NP                 Requested Prescriptions  Pending Prescriptions Disp Refills   traMADol (ULTRAM) 50 MG tablet [Pharmacy Med Name: TRAMADOL HCL 50 MG TAB] 30 tablet     Sig: TAKE 1 TABLET BY MOUTH EVERY 8 HOURS AS NEEDED FOR MODERATE TO SEVERE PAIN     Not Delegated - Analgesics:  Opioid Agonists Failed - 07/25/2021  9:39 AM      Failed - This refill cannot be delegated      Failed - Urine Drug Screen completed in last 360 days      Passed - Valid encounter within last 6 months    Recent Outpatient Visits           3 months ago DDD (degenerative disc disease), cervical   [facility] [place], Coralie Keens, NP   5 months ago Other fatigue   [facility] [place], Coralie Keens, NP               ezetimibe (ZETIA) 10 MG tablet [Pharmacy Med Name: EZETIMIBE 10 MG TAB] 90 tablet 0    Sig: TAKE 1 TABLET BY MOUTH DAILY     Cardiovascular:  Antilipid - Sterol Transport Inhibitors Failed - 07/25/2021  9:39 AM      Failed - Total Cholesterol in normal range and within 360 days    Cholesterol  Date Value Ref Range Status  06/10/2020 157 0 - 200 mg/dL Final    Comment:    ATP III Classification       Desirable:  < 200 mg/dL               Borderline High:  200 - 239 mg/dL          High:  > = 240 mg/dL          Failed - LDL in  normal range and within 360 days    LDL Cholesterol  Date Value Ref Range Status  06/10/2020 85 0 - 99 mg/dL Final   Direct LDL  Date Value Ref Range Status  12/16/2019 135.0 mg/dL Final    Comment:    Optimal:  <100 mg/dLNear or Above Optimal:  100-129 mg/dLBorderline High:  130-159 mg/dLHigh:  160-189 mg/dLVery High:  >190 mg/dL          Failed - HDL in normal range and within 360 days    HDL  Date Value Ref Range Status  06/10/2020 42.50 >39.00 mg/dL Final          Failed - Triglycerides in normal range and within 360 days    Triglycerides  Date Value Ref Range Status  06/10/2020 149.0 0.0 - 149.0 mg/dL Final    Comment:    Normal:  <150 mg/dLBorderline High:  150 - 199 mg/dL          Passed - Valid encounter within last 12 months    Recent Outpatient Visits           3 months ago DDD (degenerative disc disease), cervical   [place], Coralie Keens, NP   5 months ago Other fatigue   [facility] [place], Coralie Keens, NP

## 2021-07-27 ENCOUNTER — Ambulatory Visit: Payer: Medicare HMO | Admitting: Licensed Clinical Social Worker

## 2021-07-27 ENCOUNTER — Other Ambulatory Visit: Payer: Self-pay

## 2021-07-27 ENCOUNTER — Ambulatory Visit (INDEPENDENT_AMBULATORY_CARE_PROVIDER_SITE_OTHER): Payer: Medicare HMO | Admitting: Licensed Clinical Social Worker

## 2021-07-27 DIAGNOSIS — F4323 Adjustment disorder with mixed anxiety and depressed mood: Secondary | ICD-10-CM

## 2021-07-27 NOTE — Progress Notes (Addendum)
   THERAPIST PROGRESS NOTE  Session Time: 1-2p  Participation Level: Active  Behavioral Response: NeatAlertDepressed  Type of Therapy: Individual Therapy  Summary: Corey Bell is a 55 y.o. male who presents with symptoms consistent with adjustment disorder. Pt reports that his mood is stable and that he is working hard to manage anxiety and external stressors.  Allowed pt to explore and express thoughts and feelings associated with recent life situations and external stressors. Explored pts relationship with parents (deceased), relationships with siblings, and relationships with family friends that inherited items that belonged to pts parents. Pt states that siblings were "disinherited" per their father in the past.   Explored pts current coping mechanisms re: depression symptoms and anxiety symptoms. Assessed other symptoms and developed treatment plan.  Continued recommendations are as follows: self care behaviors, positive social engagements, focusing on overall work/home/life balance, and focusing on positive physical and emotional wellness.    Suicidal/Homicidal: No  Therapist Response: Assessment/development of treatment plan.  Plan: Return again in 4 weeks.  Diagnosis: Axis I: Adjustment disorder with mixed anxiety and depressed mood    Axis II: No diagnosis    [place], LCSW 07/27/2021

## 2021-08-01 NOTE — Plan of Care (Signed)
Developed tx plan at time of consultation/assessment with pt input.

## 2021-08-01 NOTE — Progress Notes (Signed)
Comprehensive Clinical Assessment (CCA) Note  08/01/2021 HALLIS MEDITZ [id number]  Chief Complaint:  Chief Complaint  Patient presents with   Establish Care   Visit Diagnosis: Adjustment disorder with mixed depression and anxiety symptoms    CCA Screening, Triage and Referral (STR)  Patient Reported Information How did you hear about [place]? Self  Referral name: No data recorded Referral phone number: No data recorded  Whom do you see for routine medical problems? No data recorded Practice/Facility Name: No data recorded Practice/Facility Phone Number: No data recorded Name of Contact: No data recorded Contact Number: No data recorded Contact Fax Number: No data recorded Prescriber Name: No data recorded Prescriber Address (if known): No data recorded  What Is the Reason for Your Visit/Call Today? counseling  How Long Has This Been Causing You Problems? > than 6 months  What Do You Feel Would Help You the Most Today? Treatment for Depression or other mood problem   Have You Recently Been in Any Inpatient Treatment (Hospital/Detox/Crisis Center/28-Day Program)? No  Name/Location of Program/Hospital:No data recorded How Long Were You There? No data recorded When Were You Discharged? No data recorded  Have You Ever Received Services From [facility] Before? No data recorded Who Do You See at [facility]? No data recorded  Have You Recently Had Any Thoughts About Hurting Yourself? No  Are You Planning to Commit Suicide/Harm Yourself At This time? No   Have you Recently Had Thoughts About [place]? No  Explanation: No data recorded  Have You Used Any Alcohol or Drugs in the Past 24 Hours? No  How Long Ago Did You Use Drugs or Alcohol? No data recorded What Did You Use and How Much? No data recorded  Do You Currently Have a Therapist/Psychiatrist? No (Pt has LCSW care manager)  Name of Therapist/Psychiatrist: No data recorded  Have You Been Recently  Discharged From Any Office Practice or Programs? No  Explanation of Discharge From Practice/Program: No data recorded    CCA Screening Triage Referral Assessment Type of Contact: Face-to-Face  Is this Initial or Reassessment? No data recorded Date Telepsych consult ordered in [facility]:  No data recorded Time Telepsych consult ordered in [facility]L:  No data recorded  Patient Reported Information Reviewed? No data recorded Patient Left Without Being Seen? No data recorded Reason for Not Completing Assessment: No data recorded  Collateral Involvement: No data recorded  Does Patient Have a [place]? No data recorded Name and Contact of Legal Guardian: No data recorded If Minor and Not Living with Parent(s), Who has Custody? No data recorded Is CPS involved or ever been involved? Never  Is APS involved or ever been involved? Never   Patient Determined To Be At Risk for Harm To Self or Others Based on Review of Patient Reported Information or Presenting Complaint? No  Method: No data recorded Availability of Means: No data recorded Intent: No data recorded Notification Required: No data recorded Additional Information for Danger to Others Potential: No data recorded Additional Comments for Danger to Others Potential: No data recorded Are There Guns or Other Weapons in Your Home? No data recorded Types of Guns/Weapons: No data recorded Are These Weapons Safely Secured?                            No data recorded Who Could Verify You Are Able To Have These Secured: No data recorded Do You Have any Outstanding Charges, Pending  Court Dates, Parole/Probation? No data recorded Contacted To Inform of Risk of Harm To Self or Others: No data recorded  Location of Assessment: No data recorded  Does Patient Present under Involuntary Commitment? No data recorded IVC Papers Initial File Date: No data recorded  [place] of Residence: [place]   Patient Currently Receiving the  Following Services: Not Receiving Services   Determination of Need: No data recorded  Options For Referral: Outpatient Therapy     CCA Biopsychosocial Intake/Chief Complaint:  depression/anxiety/bereavement  Current Symptoms/Problems: No data recorded  Patient Reported Schizophrenia/Schizoaffective Diagnosis in Past: No   Strengths: No data recorded Preferences: outpatient psychiatric services  Abilities: No data recorded  Type of Services Patient Feels are Needed: No data recorded  Initial Clinical Notes/Concerns: No data recorded  Mental Health Symptoms Depression:   Sleep (too much or little); Change in energy/activity; Difficulty Concentrating   Duration of Depressive symptoms:  Greater than two weeks   Mania:   None   Anxiety:    Worrying; Tension; Restlessness; Irritability; Difficulty concentrating   Psychosis:   None   Duration of Psychotic symptoms: No data recorded  Trauma:   None   Obsessions:   None   Compulsions:   None   Inattention:   None   Hyperactivity/Impulsivity:   None   Oppositional/Defiant Behaviors:   None   Emotional Irregularity:   Mood lability   Other Mood/Personality Symptoms:  No data recorded   Mental Status Exam Appearance and self-care  Stature:   Average   Weight:   Average weight   Clothing:  No data recorded  Grooming:   Normal   Cosmetic use:   None   Posture/gait:   Normal   Motor activity:   Not Remarkable   Sensorium  Attention:   Normal   Concentration:   Normal   Orientation:   X5   Recall/memory:   Normal   Affect and Mood  Affect:   Appropriate   Mood:   Anxious   Relating  Eye contact:   Normal   Facial expression:   Responsive   Attitude toward examiner:   Cooperative   Thought and Language  Speech flow:  Clear and Coherent   Thought content:   Appropriate to Mood and Circumstances   Preoccupation:   Ruminations (estate settlement of mother/family  conflict)   Hallucinations:   None   Organization:  No data recorded  Transport planner of Knowledge:   Good   Intelligence:   Average   Abstraction:   Normal   Judgement:   Good   Reality Testing:   Variable   Insight:   Gaps   Decision Making:   Normal   Social Functioning  Social Maturity:   Isolates   Social Judgement:   Normal   Stress  Stressors:   Family conflict   Coping Ability:   Programme researcher, broadcasting/film/video Deficits:   None   Supports:  No data recorded    Exercise/Diet: Exercise/Diet Have You Gained or Lost A Significant Amount of Weight in the Past Six Months?: No Do You Follow a Special Diet?: No Do You Have Any Trouble Sleeping?: No   CCA Employment/Education Employment/Work Situation: Employment / Work Situation Has Patient ever Been in Passenger transport manager?: No  CCA Family/Childhood History Family and Relationship History:    Childhood History:  Childhood History By whom was/is the patient raised?: Both parents Additional childhood history information: adopted Description of patient's relationship with caregiver when they were  a child: stable childhood Does patient have siblings?: Yes Did patient suffer any verbal/emotional/physical/sexual abuse as a child?: No Did patient suffer from severe childhood neglect?: No Has patient ever been sexually abused/assaulted/raped as an adolescent or adult?: No Was the patient ever a victim of a crime or a disaster?: No Witnessed domestic violence?: No Has patient been affected by domestic violence as an adult?: No  Child/Adolescent Assessment:     CCA Substance Use Alcohol/Drug Use: Alcohol / Drug Use Pain Medications: SEE MAR Prescriptions: SEE MAR Over the Counter: SEE MAR History of alcohol / drug use?: No history of alcohol / drug abuse    ASAM's:  Six Dimensions of Multidimensional Assessment  Dimension 1:  Acute Intoxication and/or Withdrawal Potential:      Dimension 2:   Biomedical Conditions and Complications:      Dimension 3:  Emotional, Behavioral, or Cognitive Conditions and Complications:     Dimension 4:  Readiness to Change:     Dimension 5:  Relapse, Continued use, or Continued Problem Potential:     Dimension 6:  Recovery/Living Environment:     ASAM Severity Score:    ASAM Recommended Level of Treatment:     Substance use Disorder (SUD)  none  Recommendations for Services/Supports/Treatments: Recommendations for Services/Supports/Treatments Recommendations For Services/Supports/Treatments: Individual Therapy  DSM5 Diagnoses: Patient Active Problem List   Diagnosis Date Noted   Chronic upper back pain 04/25/2021   Candidal intertrigo 04/25/2021   Bacterial skin infection 04/25/2021   Class 1 obesity due to excess calories with serious comorbidity and body mass index (BMI) of 33.0 to 33.9 in adult 04/25/2021   BPH (benign prostatic hyperplasia) 03/02/2020   Iron deficiency anemia 03/02/2020   Adjustment reaction with anxiety and depression 03/06/2019   Post-traumatic stress disorder, acute 01/27/2019   Diabetes mellitus type 2, diet-controlled ([place]) 07/31/2017   GERD (gastroesophageal reflux disease) 04/07/2015   HLD (hyperlipidemia) 06/03/2014   DDD (degenerative disc disease), cervical 10/13/2013   DDD (degenerative disc disease), lumbosacral 10/13/2013   OSA (obstructive sleep apnea) 07/01/2013   Essential hypertension 05/29/2013   Hypothyroidism     Patient Centered Plan: Patient is on the following Treatment Plan(s):  Anxiety and Depression   Referrals to Alternative Service(s): Referred to Alternative Service(s):   Place:   Date:   Time:    Referred to Alternative Service(s):   Place:   Date:   Time:    Referred to Alternative Service(s):   Place:   Date:   Time:    Referred to Alternative Service(s):   Place:   Date:   Time:     Rachel Bo [place] Paiz, LCSW

## 2021-08-02 ENCOUNTER — Telehealth: Payer: Medicare HMO

## 2021-08-03 ENCOUNTER — Telehealth: Payer: Medicare HMO

## 2021-08-07 ENCOUNTER — Telehealth: Payer: Self-pay | Admitting: Licensed Clinical Social Worker

## 2021-08-07 NOTE — Telephone Encounter (Signed)
    Clinical Social Work  Care Management   Phone Outreach    08/07/2021 Name: SAUNDERS ARLINGTON MRN: [id number] DOB: 1965-12-28  BONIFACE GOFFE is a 55 y.o. year old male who is a primary care patient of Jearld Fenton, NP .   Reason for referral: Mental Health Counseling and Resources.    F/U phone call today to assess needs, progress and barriers with care plan goals.   Telephone outreach was unsuccessful. A HIPPA compliant phone message was left for the patient providing contact information and requesting a return call.   Plan:CCM LCSW will wait for return call. If no return call is received, Will reach out to patient again in the next 30 days .   Review of patient status, including review of consultants reports, relevant laboratory and other test results, and collaboration with appropriate care team members and the patient's provider was performed as part of comprehensive patient evaluation and provision of care management services.    Christa See, MSW, [place] [facility] Care Management [email or web address] Phone [phone] 10:18 PM

## 2021-08-17 ENCOUNTER — Encounter: Payer: Self-pay | Admitting: Internal Medicine

## 2021-08-21 ENCOUNTER — Encounter: Payer: Self-pay | Admitting: Internal Medicine

## 2021-08-23 ENCOUNTER — Other Ambulatory Visit: Payer: Self-pay

## 2021-08-23 ENCOUNTER — Ambulatory Visit (INDEPENDENT_AMBULATORY_CARE_PROVIDER_SITE_OTHER): Payer: Medicare HMO | Admitting: Licensed Clinical Social Worker

## 2021-08-23 ENCOUNTER — Encounter: Payer: Self-pay | Admitting: Internal Medicine

## 2021-08-23 DIAGNOSIS — F4323 Adjustment disorder with mixed anxiety and depressed mood: Secondary | ICD-10-CM

## 2021-08-23 DIAGNOSIS — M5030 Other cervical disc degeneration, unspecified cervical region: Secondary | ICD-10-CM

## 2021-08-23 DIAGNOSIS — F4311 Post-traumatic stress disorder, acute: Secondary | ICD-10-CM

## 2021-08-23 DIAGNOSIS — E782 Mixed hyperlipidemia: Secondary | ICD-10-CM

## 2021-08-23 DIAGNOSIS — E038 Other specified hypothyroidism: Secondary | ICD-10-CM

## 2021-08-23 DIAGNOSIS — I10 Essential (primary) hypertension: Secondary | ICD-10-CM

## 2021-08-23 NOTE — Progress Notes (Signed)
Virtual Visit via Video Note  I connected with Corey Bell on 08/23/21 at  4:00 PM EST by a video enabled telemedicine application and verified that I am speaking with the correct person using two identifiers.  Location: Patient: home Provider: remote office [place], [place])   I discussed the limitations of evaluation and management by telemedicine and the availability of in person appointments. The patient expressed understanding and agreed to proceed.  I discussed the assessment and treatment plan with the patient. The patient was provided an opportunity to ask questions and all were answered. The patient agreed with the plan and demonstrated an understanding of the instructions.   The patient was advised to call back or seek an in-person evaluation if the symptoms worsen or if the condition fails to improve as anticipated.  I provided 40 minutes of non-face-to-face time during this encounter.   Kaelum Kissick R Kensi Karr, LCSW   THERAPIST PROGRESS NOTE  Session Time: 58-440p  Participation Level: Active  Behavioral Response: NAAlertAnxious and Irritable  Type of Therapy: Individual Therapy  Treatment Goals addressed:  Problem: Decrease depressive symptoms and improve levels of effective functioning Goal: LTG: Reduce frequency, intensity, and duration of depression symptoms as evidenced by: SSB input needed on appropriate metric Outcome: Progressing Note: Pt reports that he doesn't feel very depressed currently Problem: Reduce overall frequency, intensity, and duration of the anxiety so that daily functioning is not impaired. Goal: LTG: Patient will score less than 5 on the Generalized Anxiety Disorder 7 Scale (GAD-7) Outcome: Not Progressing Note: PT continuing to express significant anxiety symptoms Goal: STG: Patient will participate in at least 80% of scheduled individual psychotherapy sessions Outcome: Progressing Interventions:  Intervention: Work with patient to  identify the major components of a recent episode of anxiety: physical symptoms, major thoughts and images, and major behaviors they experienced Intervention: Assist with coping skills and behavior Intervention: Educate patient on: Stress management Intervention: Provide grief and bereavement support Intervention: Perform motivational interviewing regarding physical activity Intervention: REVIEW PLEASE SKILLS (TREAT PHYSICAL ILLNESS, BALANCE EATING, AVOID MOOD-ALTERING SUBSTANCES, BALANCE SLEEP AND GET EXERCISE) WITH Gwyndolyn Saxon "Izell [place]" Summary: Corey Bell is a 55 y.o. male who presents with ongoing symptoms related to adjustment disorder. Stress symptoms are triggered by ongoing family conflict related to pts mother's will and barriers to getting personal items that are in pts mothers home.  Pt reports that overall mood has been fluctuating. Pt reports that sleep quality and quantity is good with occasional bouts of insomnia. 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   Pt feels good knowing that his attorney is going to court for him and that he doesn't have to worry about any of this.   Reviewed depression management/stress management coping skills. Pt reports that his wife is continuing to be very supportive of him.  Pt reports that he enjoys spending time with his pet cats and enjoys traveling to visit family members. Pt reports that he also spends time with friends.   Continued recommendations are as follows: self care behaviors, positive social engagements, focusing on overall work/home/life balance, and focusing on positive physical and emotional wellness.    Suicidal/Homicidal: No  Therapist Response: Pt is continuing to apply interventions learned in session into daily life situations. Pt is currently on track to meet goals utilizing interventions mentioned above. Personal growth and progress noted. Treatment to continue as indicated.   Plan: Return again in 4 weeks.  Diagnosis: Axis I:  Adjustment disorder    Axis II: No  diagnosis    [place] Lewis, LCSW 08/23/2021

## 2021-08-24 NOTE — Chronic Care Management (AMB) (Signed)
Chronic Care Management    Clinical Social Work Note  08/24/2021 Name: Corey Bell MRN: [id number] DOB: 08/07/1966  Corey Bell is a 55 y.o. year old male who is a primary care patient of Jearld Fenton, NP. The CCM team was consulted to assist the patient with chronic disease management and/or care coordination needs related to: Mental Health Counseling and Resources.   Engaged with patient by telephone for follow up visit in response to provider referral for social work chronic care management and care coordination services.   Consent to Services:  The patient was given information about Chronic Care Management services, agreed to services, and gave verbal consent prior to initiation of services.  Please see initial visit note for detailed documentation.   Patient agreed to services and consent obtained.   Consent to Services:  The patient was given information about Care Management services, agreed to services, and gave verbal consent prior to initiation of services.  Please see initial visit note for detailed documentation.   Patient agreed to services today and consent obtained.  Engaged with patient by phone in response to provider referral for social work care coordination services:  Assessment/Interventions:  Patient continues to maintain positive progress with care plan goals. He has began counseling and continues to comply with med management to assist with symptoms. Strategies to cope with grief during holiday season discussed. See Care Plan below for interventions and patient self-care activities.  Recent life changes or stressors: Management of health conditions, grief  Recommendation: Patient may benefit from, and is in agreement work with LCSW to address care coordination needs and will continue to work with the clinical team to address health care and disease management related needs.   Follow up Plan: Patient would like continued follow-up from CCM LCSW .  per  patient's request will follow up in 10/25/21.  Will call office if needed prior to next encounter.  SDOH (Social Determinants of Health) assessments and interventions performed:    Advanced Directives Status: Not addressed in this encounter.  CCM Care Plan  Allergies  Allergen Reactions   Augmentin [Amoxicillin-Pot Clavulanate] Diarrhea   Demerol [Meperidine] Other (See Comments)    "goes crazy and has the strength of 10 men"   Eggs Or Egg-Derived Products Diarrhea and Nausea And Vomiting   Sulfamethoxazole Other (See Comments)    Blisters form in mouth   Statins Rash   Sulfa Antibiotics Rash   Sulfacetamide Sodium Rash    Outpatient Encounter Medications as of 08/23/2021  Medication Sig   albuterol (VENTOLIN HFA) 108 (90 Base) MCG/ACT inhaler Inhale 2 puffs into the lungs every 6 (six) hours as needed for wheezing or shortness of breath.   buPROPion (WELLBUTRIN XL) 300 MG 24 hr tablet Take 1 tablet (300 mg total) by mouth daily.   buPROPion (WELLBUTRIN XL) 300 MG 24 hr tablet TAKE 1 TABLET BY MOUTH DAILY   calcium carbonate (TUMS - DOSED IN MG ELEMENTAL CALCIUM) 500 MG chewable tablet Chew 3 tablets by mouth daily.   citalopram (CELEXA) 40 MG tablet TAKE 1 TABLET BY MOUTH DAILY   clonazePAM (KLONOPIN) 0.5 MG tablet Take 1 tablet (0.5 mg total) by mouth daily as needed for anxiety.   cyclobenzaprine (FLEXERIL) 10 MG tablet Take 1 tablet (10 mg total) by mouth at bedtime.   diphenhydrAMINE (BENADRYL) 25 mg capsule Take 50 mg by mouth at bedtime.   Erythromycin 2 % PADS Apply 1 application topically daily.   erythromycin with ethanol (THERAMYCIN) 2 %  external solution APPLY TOPICALLY TWICE A DAY AS NEEDED   ezetimibe (ZETIA) 10 MG tablet TAKE 1 TABLET BY MOUTH DAILY   Ferrous Sulfate (IRON) 325 (65 Fe) MG TABS Take by mouth daily.    fexofenadine (ALLEGRA) 180 MG tablet Take 180 mg by mouth daily.   fluticasone (FLONASE) 50 MCG/ACT nasal spray Place 1 spray into both nostrils 2  (two) times daily.   gabapentin (NEURONTIN) 800 MG tablet Take 1 tablet (800 mg total) by mouth 4 (four) times daily.   gabapentin (NEURONTIN) 800 MG tablet Take 1 tablet (800 mg total) by mouth 4 (four) times daily.   hydrOXYzine (VISTARIL) 25 MG capsule Take 1 capsule (25 mg total) by mouth every 8 (eight) hours as needed.   levothyroxine (SYNTHROID) 75 MCG tablet TAKE 1 TABLET BY MOUTH DAILY   methocarbamol (ROBAXIN) 500 MG tablet TAKE 2 TABLETS BY MOUTH EVERY 6 HOURS ASNEEDED FOR MUSCLE SPASMS   montelukast (SINGULAIR) 10 MG tablet Take 1 tablet (10 mg total) by mouth at bedtime.   montelukast (SINGULAIR) 10 MG tablet TAKE 1 TABLET BY MOUTH AT BEDTIME   pantoprazole (PROTONIX) 40 MG tablet TAKE 1 TABLET BY MOUTH DAILY   Saw Palmetto 450 MG CAPS Take 4 capsules by mouth daily.    sodium chloride (OCEAN) 0.65 % SOLN nasal spray Place 1 spray into both nostrils as needed for congestion.   tamsulosin (FLOMAX) 0.4 MG CAPS capsule Take 1 capsule (0.4 mg total) by mouth 2 (two) times daily.   tamsulosin (FLOMAX) 0.4 MG CAPS capsule TAKE 1 CAPSULE BY MOUTH 2 TIMES DAILY   traMADol (ULTRAM) 50 MG tablet TAKE 1 TABLET BY MOUTH EVERY 8 HOURS AS NEEDED FOR MODERATE TO SEVERE PAIN   Trolamine Salicylate (ASPERCREME EX) Apply 1 application topically as needed (for pain).   Vitamin D, Ergocalciferol, (DRISDOL) 1.25 MG (50000 UNIT) CAPS capsule Take 1 capsule (50,000 Units total) by mouth every 7 (seven) days.   No facility-administered encounter medications on file as of 08/23/2021.    Patient Active Problem List   Diagnosis Date Noted   Chronic upper back pain 04/25/2021   Candidal intertrigo 04/25/2021   Bacterial skin infection 04/25/2021   Class 1 obesity due to excess calories with serious comorbidity and body mass index (BMI) of 33.0 to 33.9 in adult 04/25/2021   BPH (benign prostatic hyperplasia) 03/02/2020   Iron deficiency anemia 03/02/2020   Adjustment reaction with anxiety and depression  03/06/2019   Post-traumatic stress disorder, acute 01/27/2019   Diabetes mellitus type 2, diet-controlled (Universal) 07/31/2017   GERD (gastroesophageal reflux disease) 04/07/2015   HLD (hyperlipidemia) 06/03/2014   DDD (degenerative disc disease), cervical 10/13/2013   DDD (degenerative disc disease), lumbosacral 10/13/2013   OSA (obstructive sleep apnea) 07/01/2013   Essential hypertension 05/29/2013   Hypothyroidism     Conditions to be addressed/monitored: Anxiety and Depression; Mental Health Concerns   Care Plan : General Social Work (Adult)  Updates made by Rebekah Chesterfield, LCSW since 08/24/2021 12:00 AM     Problem: Coping Skills (General Plan of Care)      Goal: Coping Skills Enhanced   Start Date: 05/24/2021  This Visit's Progress: On track  Recent Progress: On track  Priority: High  Note:   Current barriers:   Acute Mental Health needs related to anxiety and depression Limited social support, Mental Health Concerns , and Family and relationship dysfunction Needs Support, Education, and Care Coordination in order to meet unmet mental health needs. Clinical Goal(s):  demonstrate a reduction in symptoms related to :Adjustment disorder, Anxiety , Depression, and Grief   Clinical Interventions:  1:1 collaboration with primary care provider regarding development and update of comprehensive plan of care as evidenced by provider attestation and co-signature Inter-disciplinary care team collaboration (see longitudinal plan of care) Assessed patient's previous and current treatment, coping skills, support system and barriers to care  Patient reports difficulty managing stress triggered by mother's recent passing May 2022. Patient and spouse were very involved in her care prior to her passing 09/06:  Patient reports increase in anxiety and is interested in strengthening support to cope with symptoms. Patient is highly motivated to initiate counseling 09/20: Symptoms fluctuate depending  on the day. No report of SI/HI Patient reports decrease in PRN medications, which is improvement in him managing symptoms 11/15: Patient continues to comply with med management  Patient shared that he has not received multiple family heirlooms that mother awarded him, which has significantly strained relationship with stepbrothers 09/06: Patient reports that he has decreased communication with step-brothers, in attempt, to better manage stress 09/20: Patient recently spoke with brothers; however, states that he continues to limit interactions Patient endorses racing thoughts, difficulty relaxing, and irritability. CCM LCSW provided a safe environment for patient to process feelings of anger  Patient works one day out of the week Patient reports emotional support from spouse and has hired an Forensic psychologist to strengthen support system regarding legal claims 09/20: Patient receives limited support from attorney to date; however, patient continues to follow up States interest in learning tools to cope with stressors and is open to referral to therapy. He prefers to speak with a male therapist and is open to telehealth and traveling to [place], [place] 09/06: Patient was successful in identifying healthy coping skills (working on car) Patient verbalized consent for a referral to [facility]. CCM LCSW will collaborate with PCP regarding referral 09/20: Patient has an upcoming appointment with [facility] 11/14: Patient reports good rapport with therapist during initial appointment. Follow up appt is scheduled for  today CCM LCSW discussed strategies to cope with grief through holidays Patient reports frustration with treatment of CMA through MyChart regarding questions of upcoming appt. CCM LCSW will Special educational needs teacher and PCP of patient's negative experience CCM LCSW spoke with Surveyor, quantity at [facility]. CCM LCSW was informed that appointments are 3 weeks out and they are accepting new patients.  Depression  screen reviewed , Solution-Focused Strategies, Active listening / Reflection utilized , Emotional Supportive Provided, Provided psychoeducation for mental health needs , Participation in counseling encouraged , Verbalization of feelings encouraged , Crisis Resource Education / information provided , and Suicidal Ideation/Homicidal Ideation assessed: ; Patient Goals/Self-Care Activities: Over the next 120 days - participation in mental health treatment encouraged - self-awareness of emotional triggers encouraged - strategies to manage emotional triggers promoted - talk about feelings with a friend, family or spiritual advisor Contact clinic with any questions or concerns Attend scheduled appointments with providers        Christa See, MSW, [email or web address] Phone [phone] 2:57 PM

## 2021-08-24 NOTE — Plan of Care (Signed)
  Problem: Reduce overall frequency, intensity, and duration of the anxiety so that daily functioning is not impaired. Goal: LTG: Patient will score less than 5 on the Generalized Anxiety Disorder 7 Scale (GAD-7) Outcome: Not Progressing Note: PT continuing to express significant anxiety symptoms Goal: STG: Patient will participate in at least 80% of scheduled individual psychotherapy sessions Outcome: Progressing Intervention: Work with patient to identify the major components of a recent episode of anxiety: physical symptoms, major thoughts and images, and major behaviors they experienced Intervention: Assist with coping skills and behavior Intervention: Educate patient on: Stress management   Problem: Decrease depressive symptoms and improve levels of effective functioning Goal: LTG: Reduce frequency, intensity, and duration of depression symptoms as evidenced by: SSB input needed on appropriate metric Outcome: Progressing Note: Pt reports that he doesn't feel very depressed currently Goal: STG: Corey Saxon "Willie" WILL PARTICIPATE IN AT LEAST 80% OF SCHEDULED INDIVIDUAL PSYCHOTHERAPY SESSIONS Outcome: Progressing Intervention: Provide grief and bereavement support Intervention: Perform motivational interviewing regarding physical activity Intervention: REVIEW PLEASE SKILLS (TREAT PHYSICAL ILLNESS, BALANCE EATING, AVOID MOOD-ALTERING SUBSTANCES, BALANCE SLEEP AND GET EXERCISE) WITH Corey Saxon "[place]"

## 2021-08-24 NOTE — Telephone Encounter (Signed)
Can you add this to his medication list?

## 2021-08-24 NOTE — Patient Instructions (Signed)
Visit Information  Patient Goals/Self-Care Activities: Over the next 120 days - participation in mental health treatment encouraged - self-awareness of emotional triggers encouraged - strategies to manage emotional triggers promoted - talk about feelings with a friend, family or spiritual advisor Contact clinic with any questions or concerns Attend scheduled appointments with providers  Patient verbalizes understanding of instructions provided today and agrees to view in [place].   Telephone follow up appointment with care management team member scheduled for:10/25/21  Christa See, MSW, [email or web address] Phone [phone] 2:59 PM

## 2021-08-25 ENCOUNTER — Encounter: Payer: Self-pay | Admitting: Internal Medicine

## 2021-08-25 ENCOUNTER — Ambulatory Visit (INDEPENDENT_AMBULATORY_CARE_PROVIDER_SITE_OTHER): Payer: Medicare HMO | Admitting: Internal Medicine

## 2021-08-25 ENCOUNTER — Other Ambulatory Visit: Payer: Self-pay

## 2021-08-25 VITALS — BP 132/68 | HR 59 | Temp 97.5°F | Resp 18 | Ht 74.0 in | Wt 261.6 lb

## 2021-08-25 DIAGNOSIS — J449 Chronic obstructive pulmonary disease, unspecified: Secondary | ICD-10-CM | POA: Diagnosis not present

## 2021-08-25 DIAGNOSIS — Z8249 Family history of ischemic heart disease and other diseases of the circulatory system: Secondary | ICD-10-CM | POA: Diagnosis not present

## 2021-08-25 DIAGNOSIS — R0602 Shortness of breath: Secondary | ICD-10-CM

## 2021-08-25 DIAGNOSIS — K645 Perianal venous thrombosis: Secondary | ICD-10-CM

## 2021-08-25 MED ORDER — PRAMOXINE HCL (PERIANAL) 1 % EX FOAM: 1.0000 "application " | Freq: Three times a day (TID) | CUTANEOUS | 0 refills | Status: DC | PRN

## 2021-08-25 NOTE — Patient Instructions (Signed)
Cerebral Aneurysm A cerebral aneurysm is a bulge that occurs in a blood vessel (artery) inside the brain. An aneurysm is caused when a weakened part of the blood vessel expands. The blood vessel expands due to the constant pressure from the flow of blood through the weakened blood vessel. As the aneurysm expands, the walls of the aneurysm become weaker. Aneurysms are dangerous because they can leak or burst (rupture). When a cerebral aneurysm ruptures, it causes bleeding in the brain (subarachnoid hemorrhage). The blood flow to the area of the brain supplied by the artery is also reduced. This can cause a stroke, seizures, or a coma. A ruptured cerebral aneurysm is a medical emergency. This can cause permanent brain damage or death. What are the causes? The exact cause of this condition is not known. What increases the risk? The following factors may make you more likely to develop this condition: Being older. The condition is most common in people between the ages of 43 and 12. Being male. Having a family history of aneurysm in two or more direct relatives. Having certain conditions that are passed along from parent to child (inherited). They include: Autosomal dominant polycystic kidney disease. This is a condition in which small, fluid-filled sacs (cysts) develop in the kidney. Neurofibromatosis type 1. In this condition, flat spots develop under the skin (pigmentation) and tumors grow along nerves in the skin, brain, and other parts of the body. Ehlers-Danlos syndrome. This is a condition in which bad connective tissue causes loose or unstable joints and creates a very soft skin that bruises or tears easily. Smoking. Having high blood pressure (hypertension). Abusing alcohol. What are the signs or symptoms? The symptoms of a cerebral aneurysm that has not leaked or ruptured can depend on its size and rate of growth. A small, unchanging aneurysm generally does not cause symptoms. A larger  aneurysm that is steadily growing can increase pressure on the brain or nerves. This increased pressure can cause: A headache. Vision problems. Numbness or weakness in an arm or leg. Memory problems. Problems speaking. Seizures. If an aneurysm leaks or ruptures, it can cause a life-threatening condition, such as a stroke. Symptoms may include: A sudden, severe headache with no known cause. The headache is often described as the worst headache ever experienced. Stiff neck. Nausea or vomiting, especially when combined with other symptoms, such as a headache. Sudden weakness or numbness of the face, arm, or leg, especially on one side of the body. Sudden trouble walking or difficulty moving the arms or legs. Double vision or sudden trouble seeing in one or both eyes. Trouble speaking or understanding speech. Trouble swallowing. Dizziness. Loss of balance or coordination. Intolerance to light. Sudden confusion or loss of consciousness. How is this diagnosed? This condition is diagnosed using certain tests, including: CT scan. Computed tomographic angiogram (CTA). This test uses a dye and a scanner to produce images of your blood vessels. Magnetic resonance angiogram (MRA). This test uses an MRI machine to produce images of your blood vessels. Digital subtraction angiogram (DSA). This test involves placing a long, thin tube (catheter) into the artery in your thigh and guiding it up to the arteries in the brain. A dye is then injected into the area, and X-rays are taken to create images of your blood vessels. How is this treated? Unruptured aneurysm Treatment for an aneurysm that is not causing problems will depend on many factors, such as the size and location of the aneurysm, your age, your overall health, and  your preferences. Small aneurysms in certain locations of the brain have a very low chance of bleeding or rupturing. These small aneurysms may not need to be treated. Your health care  provider may monitor the aneurysm regularly to check for any changes. In some cases, however, treatment may be required because of the size or location of an aneurysm. Treatment options may include: Coiling. During this procedure, a catheter is inserted and advanced through a blood vessel. Once the catheter reaches the aneurysm, tiny coils are used to block blood flow into the aneurysm. This procedure is sometimes done at the same time as a DSA. Surgical clipping. During surgery, a clip is placed at the base of the aneurysm. The clip prevents blood from continuing to enter the aneurysm. Flow diversion. This procedure is used to divert blood flow around the aneurysm with a stent that is placed across the opening of an aneurysm. Ruptured aneurysm For a ruptured aneurysm, emergency surgery or coiling is often needed right away to help prevent damage to the brain and to reduce the risk of rebleeding. Follow these instructions at home: If your aneurysm is not treated: Take over-the-counter and prescription medicines only as told by your health care provider. Follow a diet suggested by your health care provider. Certain dietary changes may be advised to address hypertension, such as choosing foods that are low in salt (sodium), saturated fat, trans fat, and cholesterol. Stay physically active. Try to get at least 30 minutes of activity on most or all days of the week. Do not use any products that contain nicotine or tobacco. These products include cigarettes, chewing tobacco, and vaping devices, such as e-cigarettes. If you need help quitting, ask your health care provider. If you drink alcohol: Limit how much you have to: 0-1 drink a day for women who are not pregnant. 0-2 drinks a day for men. Know how much alcohol is in your drink. In the [place] one drink equals one 12 oz bottle of beer (355 mL), one 5 oz glass of wine (148 mL), or one 1 oz glass of hard liquor (44 mL). Do not use drugs. If you need  help quitting, ask your health care provider. Keep all follow-up visits. This is important. This includes any referrals, imaging studies, and lab tests. Proper follow-up may prevent an aneurysm rupture or a stroke. Get help right away if: You have a sudden, severe headache with no known cause. This may include a stiff neck. You have sudden nausea or vomiting with a severe headache. You have a seizure. You have other symptoms of a stroke. "BE FAST" is an easy way to remember the main warning signs of a stroke: B - Balance. Signs are dizziness, sudden trouble walking, or loss of balance. E - Eyes. Signs are trouble seeing or a sudden change in vision. F - Face. Signs are sudden weakness or numbness of the face, or the face or eyelid drooping on one side. A - Arms. Signs are weakness or numbness in an arm. This happens suddenly and usually on one side of the body. S - Speech. Signs are sudden trouble speaking, slurred speech, or trouble understanding what people say. T - Time. Time to call emergency services. Write down what time symptoms started. These symptoms may represent a serious problem that is an emergency. Do not wait to see if the symptoms will go away. Get medical help right away. Call your local emergency services (911 in the [place] Do not drive yourself to the  hospital. Summary A cerebral aneurysm is a bulge that occurs in a blood vessel (artery) inside the brain. Aneurysms are dangerous because they can leak or burst (rupture). When a cerebral aneurysm ruptures, it causes bleeding in the brain. Treatment depends on many factors, including the size and location of the aneurysm and whether it is ruptured. A ruptured aneurysm is a medical emergency. Get help right away if you have symptoms of a stroke. "BE FAST" is an easy way to remember the main warning signs of a stroke. This information is not intended to replace advice given to you by your health care provider. Make sure you discuss  any questions you have with your health care provider. Document Revised: 06/15/2020 Document Reviewed: 06/15/2020 Elsevier Patient Education  2022 [employer].

## 2021-08-25 NOTE — Progress Notes (Signed)
Subjective:    Patient ID: Corey Bell, male    DOB: Jun 04, 1966, 55 y.o.   MRN: [id number]  HPI  Patient presents the clinic today with complaint of a mass near his rectum.  He noticed this 2 weeks ago.  He reports it is not tender.  He has not noticed that it has gotten any bigger in size.  He has not noticed any drainage.  He has tried Preparation H OTC with minimal relief of symptoms.  He also reports persistent shortness of breath. he reports this started 1 year ago.  He describes it more of a chest tightness that is worse first thing in the morning and gets better throughout the day.  He reports it feels like he has to cough up mucus but he is unable to cough anything up.  He denies runny nose, nasal congestion, postnasal drip, sore throat, hoarseness, reflux, chest pain.  He has tried Human resources officer, Mucinex, Albuterol with minimal relief of symptoms.  He has seen pulmonology for the same.  Chest x-ray is normal.  CT angio chest did not show any evidence of a PE.  He has had normal PFTs.  His echocardiogram was normal.  He also reports family history of brain aneurysms.  He reports his mother died of a ruptured brain aneurysm.  He reports his brother just had a brain aneurysm coiled.  He denies headaches or dizziness.  Review of Systems     Past Medical History:  Diagnosis Date   Anxiety    Aphthous ulcer 10-04-12   improving   Arthritis    left index fnger   Carpal tunnel syndrome, bilateral    damaged nerve in neck   Chronic pain syndrome    COPD (chronic obstructive pulmonary disease) (HCC)    mild   Depression    Diabetes mellitus without complication (HCC)    type 2 , has not been medicated in over 1 year, no meds since Gastric sleeve   Diverticulitis of colon    Esophageal reflux    H/O acute prostatitis    Hypothyroidism    Insomnia, unspecified    Mixed hyperlipidemia    Peyronie disease 12-20-12   PER uROLOGY   Seasonal allergies    Sleep apnea    uses cpap    Tympanic membrane perforation    history of    Current Outpatient Medications  Medication Sig Dispense Refill   albuterol (VENTOLIN HFA) 108 (90 Base) MCG/ACT inhaler Inhale 2 puffs into the lungs every 6 (six) hours as needed for wheezing or shortness of breath. 8 g 2   buPROPion (WELLBUTRIN XL) 300 MG 24 hr tablet Take 1 tablet (300 mg total) by mouth daily. 90 tablet 0   buPROPion (WELLBUTRIN XL) 300 MG 24 hr tablet TAKE 1 TABLET BY MOUTH DAILY 90 tablet 0   calcium carbonate (TUMS - DOSED IN MG ELEMENTAL CALCIUM) 500 MG chewable tablet Chew 3 tablets by mouth daily.     citalopram (CELEXA) 40 MG tablet TAKE 1 TABLET BY MOUTH DAILY 90 tablet 3   clonazePAM (KLONOPIN) 0.5 MG tablet Take 1 tablet (0.5 mg total) by mouth daily as needed for anxiety. 20 tablet 0   cyclobenzaprine (FLEXERIL) 10 MG tablet Take 1 tablet (10 mg total) by mouth at bedtime. 30 tablet 5   diphenhydrAMINE (BENADRYL) 25 mg capsule Take 50 mg by mouth at bedtime.     Erythromycin 2 % PADS Apply 1 application topically daily. 60 each 2  erythromycin with ethanol (THERAMYCIN) 2 % external solution APPLY TOPICALLY TWICE A DAY AS NEEDED 60 mL 0   ezetimibe (ZETIA) 10 MG tablet TAKE 1 TABLET BY MOUTH DAILY 90 tablet 0   Ferrous Sulfate (IRON) 325 (65 Fe) MG TABS Take by mouth daily.      fexofenadine (ALLEGRA) 180 MG tablet Take 180 mg by mouth daily.     fluticasone (FLONASE) 50 MCG/ACT nasal spray Place 1 spray into both nostrils 2 (two) times daily. 16 g 2   gabapentin (NEURONTIN) 800 MG tablet Take 1 tablet (800 mg total) by mouth 4 (four) times daily. 360 tablet 0   gabapentin (NEURONTIN) 800 MG tablet Take 1 tablet (800 mg total) by mouth 4 (four) times daily. 360 tablet 0   hydrOXYzine (VISTARIL) 25 MG capsule Take 1 capsule (25 mg total) by mouth every 8 (eight) hours as needed. 90 capsule 0   levothyroxine (SYNTHROID) 75 MCG tablet TAKE 1 TABLET BY MOUTH DAILY 90 tablet 2   methocarbamol (ROBAXIN) 500 MG tablet  TAKE 2 TABLETS BY MOUTH EVERY 6 HOURS ASNEEDED FOR MUSCLE SPASMS 120 tablet 5   montelukast (SINGULAIR) 10 MG tablet Take 1 tablet (10 mg total) by mouth at bedtime. 90 tablet 0   montelukast (SINGULAIR) 10 MG tablet TAKE 1 TABLET BY MOUTH AT BEDTIME 90 tablet 0   pantoprazole (PROTONIX) 40 MG tablet TAKE 1 TABLET BY MOUTH DAILY 90 tablet 3   Saw Palmetto 450 MG CAPS Take 4 capsules by mouth daily.      sodium chloride (OCEAN) 0.65 % SOLN nasal spray Place 1 spray into both nostrils as needed for congestion. 15 mL 0   tamsulosin (FLOMAX) 0.4 MG CAPS capsule Take 1 capsule (0.4 mg total) by mouth 2 (two) times daily. 180 capsule 0   tamsulosin (FLOMAX) 0.4 MG CAPS capsule TAKE 1 CAPSULE BY MOUTH 2 TIMES DAILY 180 capsule 0   traMADol (ULTRAM) 50 MG tablet TAKE 1 TABLET BY MOUTH EVERY 8 HOURS AS NEEDED FOR MODERATE TO SEVERE PAIN 30 tablet 0   Trolamine Salicylate (ASPERCREME EX) Apply 1 application topically as needed (for pain).     Vitamin D, Ergocalciferol, (DRISDOL) 1.25 MG (50000 UNIT) CAPS capsule Take 1 capsule (50,000 Units total) by mouth every 7 (seven) days. 12 capsule 0   No current facility-administered medications for this visit.    Allergies  Allergen Reactions   Augmentin [Amoxicillin-Pot Clavulanate] Diarrhea   Demerol [Meperidine] Other (See Comments)    "goes crazy and has the strength of 10 men"   Eggs Or Egg-Derived Products Diarrhea and Nausea And Vomiting   Sulfamethoxazole Other (See Comments)    Blisters form in mouth   Statins Rash   Sulfa Antibiotics Rash   Sulfacetamide Sodium Rash    Family History  Problem Relation Age of Onset   Congestive Heart Failure Father    Heart disease Father    COPD Father    Kidney cancer Paternal Grandfather    Heart disease Brother        stents at 21 yo   Bladder Cancer Paternal Uncle    Lupus Brother    Heart failure Paternal Grandmother    Parkinsonism Neg Hx     Social History   Socioeconomic History   Marital  status: Married    Spouse name: [name]   Number of children: 0   Years of education: college   Highest education level: Not on file  Occupational History   Occupation: disabled  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  Tobacco Use   Smoking status: Former    Packs/day: 2.00    Years: 20.00    Pack years: 40.00    Types: Cigarettes    Quit date: 10/09/2006    Years since quitting: 14.8   Smokeless tobacco: Never  Vaping Use   Vaping Use: Never used  Substance and Sexual Activity   Alcohol use: Yes    Alcohol/week: 1.0 standard drink    Types: 1 Cans of beer per week    Comment: rarely   Drug use: No   Sexual activity: Yes    Birth control/protection: Surgical  Other Topics Concern   Not on file  Social History Narrative   12/16/19   From: [place]   Living: with wife [name], since 1990   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      Family: mother and brothers are nearby      Enjoys: record collection with 10,000 LPs - working on digitizing these, fixing up a 1968 mustang       Exercise: not currently   Diet: over-eating, tries to eat somewhat healthy - salad every other day      Safety   Seat belts: Yes    Guns: Yes  and secure   Safe in relationships: Yes    Social Determinants of Radio broadcast assistant Strain: Not on file  Food Insecurity: No Food Insecurity   Worried About Charity fundraiser in the Last Year: Never true   [place] in the Last Year: Never true  Transportation Needs: No Transportation Needs   Lack of Transportation (Medical): No   Lack of Transportation (Non-Medical): No  Physical Activity: Not on file  Stress: Not on file  Social Connections: Not on file  Intimate Partner Violence: Not on file     Constitutional: Denies fever, malaise, fatigue, headache or abrupt weight changes.  HEENT: Denies eye pain, eye redness, ear pain, ringing in the ears, wax buildup, runny nose, nasal congestion, bloody nose, or sore  throat. Respiratory: Patient reports intermittent shortness of breath.  Denies difficulty breathing, cough or sputum production.   Cardiovascular: Denies chest pain, chest tightness, palpitations or swelling in the hands or feet.  Gastrointestinal: Denies abdominal pain, bloating, constipation, diarrhea or blood in the stool.  Skin: Patient reports mass around rectum.  Denies redness, rashes, or ulcercations.  Neurological: Denies dizziness, difficulty with memory, difficulty with speech or problems with balance and coordination.    No other specific complaints in a complete review of systems (except as listed in HPI above).  Objective:   Physical Exam  BP 132/68 (BP Location: Right Arm, Patient Position: Sitting, Cuff Size: Large)   Pulse (!) 59   Temp (!) 97.5 F (36.4 C) (Temporal)   Resp 18   Ht 6\' 2"  (1.88 m)   Wt 261 lb 9.6 oz (118.7 kg)   SpO2 100%   BMI 33.59 kg/m   Wt Readings from Last 3 Encounters:  04/25/21 262 lb (118.8 kg)  02/22/21 262 lb 12.8 oz (119.2 kg)  08/17/20 257 lb 9.6 oz (116.8 kg)    General: Appears his stated age, obese, in NAD. Skin: Warm, dry and intact.  Cardiovascular: Normal rate and rhythm.  Pulmonary/Chest: Normal effort and positive vesicular breath sounds.  Rectal: 2 cm thrombosed hemorrhoid noted at 4:00 at the rectum. Musculoskeletal:  No difficulty with gait.  Neurological: Alert and oriented.  BMET    Component Value Date/Time   NA 141 06/02/2020 0431   NA 142 01/27/2018 0000   NA 138 12/26/2012 1936   K 4.0 06/02/2020 0431   K 3.9 12/26/2012 1936   CL 107 06/02/2020 0431   CL 106 12/26/2012 1936   CO2 27 06/02/2020 0431   CO2 24 12/26/2012 1936   GLUCOSE 138 (H) 06/02/2020 0431   GLUCOSE 124 (H) 12/26/2012 1936   BUN 18 06/02/2020 0431   BUN 19 01/27/2018 0000   BUN 22 (H) 12/26/2012 1936   CREATININE 1.00 06/02/2020 0431   CREATININE 1.24 09/23/2015 1440   CALCIUM 9.0 06/02/2020 0431   CALCIUM 8.4 (L) 12/26/2012  1936   GFRNONAA >60 06/02/2020 0431   GFRNONAA 58 (L) 12/26/2012 1936   GFRAA >60 06/02/2020 0431   GFRAA >60 12/26/2012 1936    Lipid Panel     Component Value Date/Time   CHOL 157 06/10/2020 0919   TRIG 149.0 06/10/2020 0919   HDL 42.50 06/10/2020 0919   CHOLHDL 4 06/10/2020 0919   VLDL 29.8 06/10/2020 0919   LDLCALC 85 06/10/2020 0919    CBC    Component Value Date/Time   WBC 5.2 06/02/2020 0431   RBC 4.68 06/02/2020 0431   HGB 15.0 06/02/2020 0431   HGB 15.2 12/26/2012 1936   HCT 41.5 06/02/2020 0431   HCT 44.6 12/26/2012 1936   PLT 167 06/02/2020 0431   PLT 218 12/26/2012 1936   MCV 88.7 06/02/2020 0431   MCV 94 12/26/2012 1936   MCH 32.1 06/02/2020 0431   MCHC 36.1 (H) 06/02/2020 0431   RDW 12.6 06/02/2020 0431   RDW 13.3 12/26/2012 1936   LYMPHSABS 1.9 06/02/2020 0431   MONOABS 0.4 06/02/2020 0431   EOSABS 0.2 06/02/2020 0431   BASOSABS 0.1 06/02/2020 0431    Hgb A1C Lab Results  Component Value Date   HGBA1C 5.8 (H) 02/22/2021           Assessment & Plan:   Thrombosed Hemorrhoid:  Failed Preparation H OTC Rx for Proctofoam Can refer to GI if symptoms persist or worsen  Shortness of Breath:  Normal pulmonology work-up He does not feel like this is related to reflux at all Advised him to follow-up with his cardiologist to see about a stress test if symptoms persist or worsen  Family History of Brain Aneurysms:  CTA of the head ordered  We will follow-up after imaging, return precautions discussed  Webb Silversmith, NP .This visit occurred during the SARS-CoV-2 public health emergency.  Safety protocols were in place, including screening questions prior to the visit, additional usage of staff PPE, and extensive cleaning of exam room while observing appropriate contact time as indicated for disinfecting solutions.

## 2021-08-26 ENCOUNTER — Encounter: Payer: Self-pay | Admitting: Internal Medicine

## 2021-09-07 DIAGNOSIS — E782 Mixed hyperlipidemia: Secondary | ICD-10-CM

## 2021-09-07 DIAGNOSIS — I10 Essential (primary) hypertension: Secondary | ICD-10-CM

## 2021-09-07 DIAGNOSIS — E038 Other specified hypothyroidism: Secondary | ICD-10-CM

## 2021-09-07 DIAGNOSIS — Z87891 Personal history of nicotine dependence: Secondary | ICD-10-CM

## 2021-09-07 DIAGNOSIS — F4323 Adjustment disorder with mixed anxiety and depressed mood: Secondary | ICD-10-CM

## 2021-09-21 ENCOUNTER — Ambulatory Visit: Payer: Medicare HMO

## 2021-09-22 ENCOUNTER — Other Ambulatory Visit: Payer: Self-pay

## 2021-09-22 ENCOUNTER — Encounter: Payer: Self-pay | Admitting: Internal Medicine

## 2021-09-22 ENCOUNTER — Ambulatory Visit
Admission: RE | Admit: 2021-09-22 | Discharge: 2021-09-22 | Disposition: A | Discharge status: Home / Self Care | Payer: Medicare HMO | Source: Ambulatory Visit | Attending: Internal Medicine | Admitting: Internal Medicine

## 2021-09-22 DIAGNOSIS — Z8249 Family history of ischemic heart disease and other diseases of the circulatory system: Secondary | ICD-10-CM | POA: Insufficient documentation

## 2021-09-22 DIAGNOSIS — Z136 Encounter for screening for cardiovascular disorders: Secondary | ICD-10-CM | POA: Insufficient documentation

## 2021-09-22 LAB — POCT I-STAT CREATININE: Creatinine, Ser: 1.3 mg/dL — ABNORMAL HIGH (ref 0.61–1.24)

## 2021-09-22 MED ORDER — IOHEXOL 350 MG/ML SOLN
75.0000 mL | Freq: Once | INTRAVENOUS | Status: AC | PRN
  Administered 2021-09-22: 75 mL via INTRAVENOUS

## 2021-09-26 ENCOUNTER — Other Ambulatory Visit: Payer: Self-pay

## 2021-09-26 ENCOUNTER — Ambulatory Visit (INDEPENDENT_AMBULATORY_CARE_PROVIDER_SITE_OTHER): Payer: Medicare HMO | Admitting: Internal Medicine

## 2021-09-26 ENCOUNTER — Encounter: Payer: Self-pay | Admitting: Internal Medicine

## 2021-09-26 VITALS — BP 129/72 | HR 60 | Temp 97.5°F | Resp 18 | Ht 74.0 in | Wt 265.2 lb

## 2021-09-26 DIAGNOSIS — K6289 Other specified diseases of anus and rectum: Secondary | ICD-10-CM | POA: Diagnosis not present

## 2021-09-26 DIAGNOSIS — E119 Type 2 diabetes mellitus without complications: Secondary | ICD-10-CM | POA: Diagnosis not present

## 2021-09-26 DIAGNOSIS — E038 Other specified hypothyroidism: Secondary | ICD-10-CM

## 2021-09-26 NOTE — Assessment & Plan Note (Signed)
CBC, c-Met, lipid and A1c today Encouraged him to consume a low carb diet and exercise for weight loss Encourage routine eye exams Encourage routine foot exams

## 2021-09-26 NOTE — Progress Notes (Signed)
Subjective:    Patient ID: Corey Bell, male    DOB: October 27, 1965, 55 y.o.   MRN: [id number]  HPI  Pt presents to the clinic today with c/o a mass in his rectum.  He noticed this a few months ago.  He reports the mass is not itchy or painful.  He has not noticed any drainage from the area.  He does feel like the mass has increased in size.  He was seen 11/17 for the same, diagnosed with a thrombosed hemorrhoid.  He was prescribed Proctofoam and Anusol suppositories however these medications were not affordable so he was not able to take them.  He denies any difficulty passing stool at this time.  He has never had a colonoscopy and does not want one.  He would also like to know if he could have his labs done prior to a physical appointment in January or February.  Hypothyroidism: He denies any issues on his current dose of Levothyroxine.  He does not follow with endocrinology.  DM2: His last A1c was 5.8%, 02/2021.  He is not taking any oral diabetic medication at this time.  He does not check his sugars.  Review of Systems    Past Medical History:  Diagnosis Date   Anxiety    Aphthous ulcer 10-04-12   improving   Arthritis    left index fnger   Carpal tunnel syndrome, bilateral    damaged nerve in neck   Chronic pain syndrome    COPD (chronic obstructive pulmonary disease) (HCC)    mild   Depression    Diabetes mellitus without complication (HCC)    type 2 , has not been medicated in over 1 year, no meds since Gastric sleeve   Diverticulitis of colon    Esophageal reflux    H/O acute prostatitis    Hypothyroidism    Insomnia, unspecified    Mixed hyperlipidemia    Peyronie disease 12-20-12   PER uROLOGY   Seasonal allergies    Sleep apnea    uses cpap   Tympanic membrane perforation    history of    Current Outpatient Medications  Medication Sig Dispense Refill   buPROPion (WELLBUTRIN XL) 300 MG 24 hr tablet Take 1 tablet (300 mg total) by mouth daily. 90 tablet 0    calcium carbonate (TUMS - DOSED IN MG ELEMENTAL CALCIUM) 500 MG chewable tablet Chew 3 tablets by mouth daily.     citalopram (CELEXA) 40 MG tablet TAKE 1 TABLET BY MOUTH DAILY 90 tablet 3   clonazePAM (KLONOPIN) 0.5 MG tablet Take 1 tablet (0.5 mg total) by mouth daily as needed for anxiety. 20 tablet 0   cyclobenzaprine (FLEXERIL) 10 MG tablet Take 1 tablet (10 mg total) by mouth at bedtime. 30 tablet 5   diphenhydrAMINE (BENADRYL) 25 mg capsule Take 50 mg by mouth at bedtime.     Erythromycin 2 % PADS Apply 1 application topically daily. 60 each 2   erythromycin with ethanol (THERAMYCIN) 2 % external solution APPLY TOPICALLY TWICE A DAY AS NEEDED 60 mL 0   ezetimibe (ZETIA) 10 MG tablet TAKE 1 TABLET BY MOUTH DAILY 90 tablet 0   fexofenadine (ALLEGRA) 180 MG tablet Take 180 mg by mouth daily.     fluticasone (CUTIVATE) 0.05 % cream      fluticasone (FLONASE) 50 MCG/ACT nasal spray Place 1 spray into both nostrils 2 (two) times daily. 16 g 2   gabapentin (NEURONTIN) 800 MG tablet Take 1 tablet (  800 mg total) by mouth 4 (four) times daily. 360 tablet 0   hydrOXYzine (VISTARIL) 25 MG capsule Take 1 capsule (25 mg total) by mouth every 8 (eight) hours as needed. 90 capsule 0   levothyroxine (SYNTHROID) 75 MCG tablet TAKE 1 TABLET BY MOUTH DAILY 90 tablet 2   methocarbamol (ROBAXIN) 500 MG tablet TAKE 2 TABLETS BY MOUTH EVERY 6 HOURS ASNEEDED FOR MUSCLE SPASMS 120 tablet 5   montelukast (SINGULAIR) 10 MG tablet Take 1 tablet (10 mg total) by mouth at bedtime. 90 tablet 0   pantoprazole (PROTONIX) 40 MG tablet TAKE 1 TABLET BY MOUTH DAILY 90 tablet 3   pramoxine (PROCTOFOAM) 1 % foam Place 1 application rectally 3 (three) times daily as needed for anal itching. 15 g 0   Saw Palmetto 450 MG CAPS Take 4 capsules by mouth daily.      sodium chloride (OCEAN) 0.65 % SOLN nasal spray Place 1 spray into both nostrils as needed for congestion. 15 mL 0   tamsulosin (FLOMAX) 0.4 MG CAPS capsule Take 1 capsule  (0.4 mg total) by mouth 2 (two) times daily. 180 capsule 0   traMADol (ULTRAM) 50 MG tablet TAKE 1 TABLET BY MOUTH EVERY 8 HOURS AS NEEDED FOR MODERATE TO SEVERE PAIN 30 tablet 0   No current facility-administered medications for this visit.    Allergies  Allergen Reactions   Augmentin [Amoxicillin-Pot Clavulanate] Diarrhea   Demerol [Meperidine] Other (See Comments)    "goes crazy and has the strength of 10 men"   Eggs Or Egg-Derived Products Diarrhea and Nausea And Vomiting   Sulfamethoxazole Other (See Comments)    Blisters form in mouth   Statins Rash   Sulfa Antibiotics Rash   Sulfacetamide Sodium Rash    Family History  Problem Relation Age of Onset   Congestive Heart Failure Father    Heart disease Father    COPD Father    Kidney cancer Paternal Grandfather    Heart disease Brother        stents at 34 yo   Bladder Cancer Paternal Uncle    Lupus Brother    Heart failure Paternal Grandmother    Parkinsonism Neg Hx     Social History   Socioeconomic History   Marital status: Married    Spouse name: [name]   Number of children: 0   Years of education: college   Highest education level: Not on file  Occupational History   Occupation: disabled    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  Tobacco Use   Smoking status: Former    Packs/day: 2.00    Years: 20.00    Pack years: 40.00    Types: Cigarettes    Quit date: 10/09/2006    Years since quitting: 14.9   Smokeless tobacco: Never  Vaping Use   Vaping Use: Never used  Substance and Sexual Activity   Alcohol use: Yes    Alcohol/week: 1.0 standard drink    Types: 1 Cans of beer per week    Comment: rarely   Drug use: No   Sexual activity: Yes    Birth control/protection: Surgical  Other Topics Concern   Not on file  Social History Narrative   12/16/19   From: [place]   Living: with wife [name], since 1990   Work: [employer] one day a week      Family: mother and brothers are nearby       Enjoys: record collection with 10,000 LPs -  working on digitizing these, fixing up a 1968 mustang       Exercise: not currently   Diet: over-eating, tries to eat somewhat healthy - salad every other day      Safety   Seat belts: Yes    Guns: Yes  and secure   Safe in relationships: Yes    Social Determinants of Health   Financial Resource Strain: Not on file  Food Insecurity: No Food Insecurity   Worried About Charity fundraiser in the Last Year: Never true   Ran Out of Food in the Last Year: Never true  Transportation Needs: No Transportation Needs   Lack of Transportation (Medical): No   Lack of Transportation (Non-Medical): No  Physical Activity: Not on file  Stress: Not on file  Social Connections: Not on file  Intimate Partner Violence: Not on file     Constitutional: Denies fever, malaise, fatigue, headache or abrupt weight changes.  Respiratory: Denies difficulty breathing, shortness of breath, cough or sputum production.   Cardiovascular: Denies chest pain, chest tightness, palpitations or swelling in the hands or feet.  Gastrointestinal: Patient reports mass of rectum.  Denies abdominal pain, bloating, constipation, diarrhea or blood in the stool.  Skin: Denies redness, rashes, or ulcercations.    No other specific complaints in a complete review of systems (except as listed in HPI above).  Objective:   Physical Exam  BP 129/72 (BP Location: Right Arm, Patient Position: Sitting, Cuff Size: Large)    Pulse 60    Temp (!) 97.5 F (36.4 C) (Temporal)    Resp 18    Ht 6\' 2"  (1.88 m)    Wt 265 lb 3.2 oz (120.3 kg)    SpO2 100%    BMI 34.05 kg/m   Wt Readings from Last 3 Encounters:  08/25/21 261 lb 9.6 oz (118.7 kg)  04/25/21 262 lb (118.8 kg)  02/22/21 262 lb 12.8 oz (119.2 kg)    General: Appears his stated age, obese, in NAD. Skin: Warm, dry and intact.  Cardiovascular: Normal rate. Pulmonary/Chest: Normal effort. Abdomen: Normal bowel sounds.  Rectal:  He has a pea-sized hard mass noted at 4:00 at the rectal wall.  Normal rectal tone. Neurological: Alert and oriented.    BMET    Component Value Date/Time   NA 141 06/02/2020 0431   NA 142 01/27/2018 0000   NA 138 12/26/2012 1936   K 4.0 06/02/2020 0431   K 3.9 12/26/2012 1936   CL 107 06/02/2020 0431   CL 106 12/26/2012 1936   CO2 27 06/02/2020 0431   CO2 24 12/26/2012 1936   GLUCOSE 138 (H) 06/02/2020 0431   GLUCOSE 124 (H) 12/26/2012 1936   BUN 18 06/02/2020 0431   BUN 19 01/27/2018 0000   BUN 22 (H) 12/26/2012 1936   CREATININE 1.30 (H) 09/22/2021 0941   CREATININE 1.24 09/23/2015 1440   CALCIUM 9.0 06/02/2020 0431   CALCIUM 8.4 (L) 12/26/2012 1936   GFRNONAA >60 06/02/2020 0431   GFRNONAA 58 (L) 12/26/2012 1936   GFRAA >60 06/02/2020 0431   GFRAA >60 12/26/2012 1936    Lipid Panel     Component Value Date/Time   CHOL 157 06/10/2020 0919   TRIG 149.0 06/10/2020 0919   HDL 42.50 06/10/2020 0919   CHOLHDL 4 06/10/2020 0919   VLDL 29.8 06/10/2020 0919   LDLCALC 85 06/10/2020 0919    CBC    Component Value Date/Time   WBC 5.2 06/02/2020 0431   RBC 4.68  06/02/2020 0431   HGB 15.0 06/02/2020 0431   HGB 15.2 12/26/2012 1936   HCT 41.5 06/02/2020 0431   HCT 44.6 12/26/2012 1936   PLT 167 06/02/2020 0431   PLT 218 12/26/2012 1936   MCV 88.7 06/02/2020 0431   MCV 94 12/26/2012 1936   MCH 32.1 06/02/2020 0431   MCHC 36.1 (H) 06/02/2020 0431   RDW 12.6 06/02/2020 0431   RDW 13.3 12/26/2012 1936   LYMPHSABS 1.9 06/02/2020 0431   MONOABS 0.4 06/02/2020 0431   EOSABS 0.2 06/02/2020 0431   BASOSABS 0.1 06/02/2020 0431    Hgb A1C Lab Results  Component Value Date   HGBA1C 5.8 (H) 02/22/2021           Assessment & Plan:    Webb Silversmith, NP This visit occurred during the SARS-CoV-2 public health emergency.  Safety protocols were in place, including screening questions prior to the visit, additional usage of staff PPE, and extensive cleaning of exam  room while observing appropriate contact time as indicated for disinfecting solutions. '

## 2021-09-26 NOTE — Assessment & Plan Note (Signed)
TSH and free T4 today Will adjust Levothyroxine if needed based on labs 

## 2021-09-26 NOTE — Patient Instructions (Signed)

## 2021-09-27 ENCOUNTER — Telehealth: Payer: Medicare HMO

## 2021-09-27 LAB — COMPLETE METABOLIC PANEL WITH GFR
AG Ratio: 1.7 (calc) (ref 1.0–2.5)
ALT: 36 U/L (ref 9–46)
AST: 24 U/L (ref 10–35)
Albumin: 4.5 g/dL (ref 3.6–5.1)
Alkaline phosphatase (APISO): 62 U/L (ref 35–144)
BUN: 14 mg/dL (ref 7–25)
CO2: 30 mmol/L (ref 20–32)
Calcium: 9.4 mg/dL (ref 8.6–10.3)
Chloride: 103 mmol/L (ref 98–110)
Creat: 1.23 mg/dL (ref 0.70–1.30)
Globulin: 2.7 g/dL (calc) (ref 1.9–3.7)
Glucose, Bld: 85 mg/dL (ref 65–139)
Potassium: 5 mmol/L (ref 3.5–5.3)
Sodium: 141 mmol/L (ref 135–146)
Total Bilirubin: 0.9 mg/dL (ref 0.2–1.2)
Total Protein: 7.2 g/dL (ref 6.1–8.1)
eGFR: 70 mL/min/{1.73_m2} (ref >=60)

## 2021-09-27 LAB — CBC
HCT: 45.7 % (ref 38.5–50.0)
Hemoglobin: 15.5 g/dL (ref 13.2–17.1)
MCH: 31.4 pg (ref 27.0–33.0)
MCHC: 33.9 g/dL (ref 32.0–36.0)
MCV: 92.5 fL (ref 80.0–100.0)
MPV: 9.6 fL (ref 7.5–12.5)
Platelets: 205 10*3/uL (ref 140–400)
RBC: 4.94 10*6/uL (ref 4.20–5.80)
RDW: 12.3 % (ref 11.0–15.0)
WBC: 5.6 10*3/uL (ref 3.8–10.8)

## 2021-09-27 LAB — LIPID PANEL
Cholesterol: 173 mg/dL (ref <200)
HDL: 37 mg/dL — ABNORMAL LOW (ref >=40)
LDL Cholesterol (Calc): 100 mg/dL (calc) — ABNORMAL HIGH
Non-HDL Cholesterol (Calc): 136 mg/dL (calc) — ABNORMAL HIGH (ref <130)
Total CHOL/HDL Ratio: 4.7 (calc) (ref <5.0)
Triglycerides: 250 mg/dL — ABNORMAL HIGH (ref <150)

## 2021-09-27 LAB — T4, FREE: Free T4: 1 ng/dL (ref 0.8–1.8)

## 2021-09-27 LAB — HEMOGLOBIN A1C
Hgb A1c MFr Bld: 5.8 % of total Hgb — ABNORMAL HIGH (ref <5.7)
Mean Plasma Glucose: 120 mg/dL
eAG (mmol/L): 6.6 mmol/L

## 2021-09-27 LAB — TSH: TSH: 2.98 mIU/L (ref 0.40–4.50)

## 2021-09-29 ENCOUNTER — Other Ambulatory Visit: Payer: Self-pay | Admitting: Internal Medicine

## 2021-09-29 NOTE — Telephone Encounter (Signed)
Requested Prescriptions  Pending Prescriptions Disp Refills   gabapentin (NEURONTIN) 800 MG tablet [Pharmacy Med Name: GABAPENTIN 800 MG TAB] 360 tablet 0    Sig: TAKE 1 TABLET BY MOUTH 4 TIMES DAILY     Neurology: Anticonvulsants - gabapentin Passed - 09/29/2021 12:18 PM      Passed - Valid encounter within last 12 months    Recent Outpatient Visits          3 days ago Rectal mass   [facility] [place], Coralie Keens, NP   1 month ago Family history of brain aneurysm   [place], Coralie Keens, NP   5 months ago DDD (degenerative disc disease), cervical   [place], NP   7 months ago Other fatigue   [facility] [place], Coralie Keens, NP      Future Appointments            In 1 month [place], Coralie Keens, NP [facility], [facility]

## 2021-10-11 ENCOUNTER — Other Ambulatory Visit: Payer: Self-pay | Admitting: Internal Medicine

## 2021-10-12 NOTE — Telephone Encounter (Signed)
Requested medications are due for refill today.  yes  Requested medications are on the active medications list.  yes  Last refill. 12/20/2020  Future visit scheduled.   yes  Notes to clinic.  Medication is not delegated.    Requested Prescriptions  Pending Prescriptions Disp Refills   methocarbamol (ROBAXIN) 500 MG tablet [Pharmacy Med Name: METHOCARBAMOL 500 MG TAB] 120 tablet 5    Sig: TAKE 2 TABLETS BY MOUTH EVERY 6 HOURS ASNEEDED FOR MUSCLE SPASMS     Not Delegated - Analgesics:  Muscle Relaxants Failed - 10/11/2021  5:01 PM      Failed - This refill cannot be delegated      Passed - Valid encounter within last 6 months    Recent Outpatient Visits           2 weeks ago Rectal mass   [facility] [place], Coralie Keens, NP   1 month ago Family history of brain aneurysm   [place], Coralie Keens, NP   5 months ago DDD (degenerative disc disease), cervical   [place], NP   7 months ago Other fatigue   [facility] [place], Coralie Keens, NP       Future Appointments             In 2 weeks [place], Coralie Keens, NP [facility], [facility]

## 2021-10-25 ENCOUNTER — Ambulatory Visit (INDEPENDENT_AMBULATORY_CARE_PROVIDER_SITE_OTHER): Payer: Medicare HMO | Admitting: Licensed Clinical Social Worker

## 2021-10-25 DIAGNOSIS — E038 Other specified hypothyroidism: Secondary | ICD-10-CM

## 2021-10-25 DIAGNOSIS — F4311 Post-traumatic stress disorder, acute: Secondary | ICD-10-CM

## 2021-10-25 DIAGNOSIS — E119 Type 2 diabetes mellitus without complications: Secondary | ICD-10-CM

## 2021-10-25 DIAGNOSIS — M5030 Other cervical disc degeneration, unspecified cervical region: Secondary | ICD-10-CM

## 2021-10-25 DIAGNOSIS — F4323 Adjustment disorder with mixed anxiety and depressed mood: Secondary | ICD-10-CM

## 2021-10-25 DIAGNOSIS — I10 Essential (primary) hypertension: Secondary | ICD-10-CM

## 2021-10-26 NOTE — Patient Instructions (Signed)
Visit Information  Thank you for taking time to visit with me today. Please don't hesitate to contact me if I can be of assistance to you before our next scheduled telephone appointment.  Following are the goals we discussed today:  Patient Goals/Self-Care Activities: Over the next 120 days - participation in mental health treatment encouraged - self-awareness of emotional triggers encouraged - strategies to manage emotional triggers promoted - talk about feelings with a friend, family or spiritual advisor Contact clinic with any questions or concerns Attend scheduled appointments with providers  Our next appointment is by telephone on 12/20/21 at 2:15 PM  Please call the care guide team at [phone] if you need to cancel or reschedule your appointment.   If you are experiencing a Mental Health or [place] or need someone to talk to, please call the Suicide and Crisis Lifeline: 988 call 911   Patient verbalizes understanding of instructions and care plan provided today and agrees to view in [place]. Active MyChart status confirmed with patient.    Christa See, MSW, [facility] Care Management [email or web address] Phone [phone] 11:32 AM

## 2021-10-26 NOTE — Chronic Care Management (AMB) (Signed)
Chronic Care Management    Clinical Social Work Note  10/26/2021 Name: Corey Bell MRN: [id number] DOB: [birth year]  Corey Bell is a 56 y.o. year old male who is a primary care patient of Jearld Fenton, NP. The CCM team was consulted to assist the patient with chronic disease management and/or care coordination needs related to: Mental Health Counseling and Resources.   Engaged with patient by telephone for follow up visit in response to provider referral for social work chronic care management and care coordination services.   Consent to Services:  The patient was given information about Chronic Care Management services, agreed to services, and gave verbal consent prior to initiation of services.  Please see initial visit note for detailed documentation.   Patient agreed to services and consent obtained.    Summary:  Patient continues to maintain positive progress with care plan goals. Patient discontinued services with therapist due to low rapport. He states symptoms have improved and he is "riding it out" LCSW encouraged patient to re-start services through another agency or with another counselor. He is not interested in services currently.  See Care Plan below for interventions and patient self-care activities.  Recommendation: Patient may benefit from, and is in agreement work with LCSW to address care coordination needs and will continue to work with the clinical team to address health care and disease management related needs.   Follow up Plan: Patient would like continued follow-up from CCM LCSW.  per patient's request will follow up in 12/20/21.  Will call office if needed prior to next encounter.         SDOH (Social Determinants of Health) assessments and interventions performed:    Advanced Directives Status: Not addressed in this encounter.  CCM Care Plan  Allergies  Allergen Reactions   Augmentin [Amoxicillin-Pot Clavulanate] Diarrhea   Demerol  [Meperidine] Other (See Comments)    "goes crazy and has the strength of 10 men"   Eggs Or Egg-Derived Products Diarrhea and Nausea And Vomiting   Sulfamethoxazole Other (See Comments)    Blisters form in mouth   Statins Rash   Sulfa Antibiotics Rash   Sulfacetamide Sodium Rash    Outpatient Encounter Medications as of 10/25/2021  Medication Sig   buPROPion (WELLBUTRIN XL) 300 MG 24 hr tablet Take 1 tablet (300 mg total) by mouth daily.   calcium carbonate (TUMS - DOSED IN MG ELEMENTAL CALCIUM) 500 MG chewable tablet Chew 3 tablets by mouth daily.   citalopram (CELEXA) 40 MG tablet TAKE 1 TABLET BY MOUTH DAILY   clonazePAM (KLONOPIN) 0.5 MG tablet Take 1 tablet (0.5 mg total) by mouth daily as needed for anxiety.   cyclobenzaprine (FLEXERIL) 10 MG tablet Take 1 tablet (10 mg total) by mouth at bedtime.   diphenhydrAMINE (BENADRYL) 25 mg capsule Take 50 mg by mouth at bedtime.   erythromycin with ethanol (THERAMYCIN) 2 % external solution APPLY TOPICALLY TWICE A DAY AS NEEDED   ezetimibe (ZETIA) 10 MG tablet TAKE 1 TABLET BY MOUTH DAILY   fexofenadine (ALLEGRA) 180 MG tablet Take 180 mg by mouth daily.   finasteride (PROPECIA) 1 MG tablet Take 0.25 mg by mouth daily.   fluticasone (CUTIVATE) 0.05 % cream    fluticasone (FLONASE) 50 MCG/ACT nasal spray Place 1 spray into both nostrils 2 (two) times daily.   gabapentin (NEURONTIN) 800 MG tablet TAKE 1 TABLET BY MOUTH 4 TIMES DAILY   hydrOXYzine (VISTARIL) 25 MG capsule Take 1 capsule (25 mg total) by  mouth every 8 (eight) hours as needed.   Ibuprofen 200 MG CAPS    levothyroxine (SYNTHROID) 75 MCG tablet TAKE 1 TABLET BY MOUTH DAILY   methocarbamol (ROBAXIN) 500 MG tablet TAKE 2 TABLETS BY MOUTH EVERY 6 HOURS ASNEEDED FOR MUSCLE SPASMS   montelukast (SINGULAIR) 10 MG tablet Take 1 tablet (10 mg total) by mouth at bedtime.   pantoprazole (PROTONIX) 40 MG tablet TAKE 1 TABLET BY MOUTH DAILY   pramoxine (PROCTOFOAM) 1 % foam Place 1  application rectally 3 (three) times daily as needed for anal itching.   Saw Palmetto 450 MG CAPS Take 4 capsules by mouth daily.    sodium chloride (OCEAN) 0.65 % SOLN nasal spray Place 1 spray into both nostrils as needed for congestion.   tamsulosin (FLOMAX) 0.4 MG CAPS capsule Take 1 capsule (0.4 mg total) by mouth 2 (two) times daily. (Patient taking differently: Take 0.8 mg by mouth at bedtime.)   traMADol (ULTRAM) 50 MG tablet TAKE 1 TABLET BY MOUTH EVERY 8 HOURS AS NEEDED FOR MODERATE TO SEVERE PAIN   No facility-administered encounter medications on file as of 10/25/2021.    Patient Active Problem List   Diagnosis Date Noted   Chronic upper back pain 04/25/2021   Class 1 obesity due to excess calories with serious comorbidity and body mass index (BMI) of 33.0 to 33.9 in adult 04/25/2021   BPH (benign prostatic hyperplasia) 03/02/2020   Iron deficiency anemia 03/02/2020   Adjustment reaction with anxiety and depression 03/06/2019   Post-traumatic stress disorder, acute 01/27/2019   Diabetes mellitus type 2, diet-controlled ([place]) 07/31/2017   GERD (gastroesophageal reflux disease) 04/07/2015   HLD (hyperlipidemia) 06/03/2014   DDD (degenerative disc disease), cervical 10/13/2013   DDD (degenerative disc disease), lumbosacral 10/13/2013   OSA (obstructive sleep apnea) 07/01/2013   Essential hypertension 05/29/2013   Hypothyroidism     Conditions to be addressed/monitored: HTN, DMII, Anxiety, Depression, and PTSD ; Grief  Care Plan : General Social Work (Adult)  Updates made by Rebekah Chesterfield, LCSW since 10/26/2021 12:00 AM     Problem: Coping Skills (General Plan of Care)      Goal: Coping Skills Enhanced   Start Date: 05/24/2021  This Visit's Progress: On track  Recent Progress: On track  Priority: High  Note:   Current barriers:   Acute Mental Health needs related to anxiety and depression Limited social support, Mental Health Concerns , and Family and relationship  dysfunction Needs Support, Education, and Care Coordination in order to meet unmet mental health needs. Clinical Goal(s): demonstrate a reduction in symptoms related to :Adjustment disorder, Anxiety , Depression, and Grief   Clinical Interventions:  1:1 collaboration with primary care provider regarding development and update of comprehensive plan of care as evidenced by provider attestation and co-signature Inter-disciplinary care team collaboration (see longitudinal plan of care) Assessed patient's previous and current treatment, coping skills, support system and barriers to care  Patient reports difficulty managing stress triggered by mother's recent passing May 2022. Patient and spouse were very involved in her care prior to her passing 09/06:  Patient reports increase in anxiety and is interested in strengthening support to cope with symptoms. Patient is highly motivated to initiate counseling 09/20: Symptoms fluctuate depending on the day. No report of SI/HI Patient reports decrease in PRN medications, which is improvement in him managing symptoms 11/15: Patient continues to comply with med management 1/17: Reports decrease in symptoms and spouse is a form of support Patient shared that  he has not received multiple family heirlooms that mother awarded him, which has significantly strained relationship with stepbrothers 09/06: Patient reports that he has decreased communication with step-brothers, in attempt, to better manage stress 09/20: Patient recently spoke with brothers; however, states that he continues to limit interactions Patient endorses racing thoughts, difficulty relaxing, and irritability. CCM LCSW provided a safe environment for patient to process feelings of anger  Patient works one day out of the week Patient reports emotional support from spouse and has hired an Forensic psychologist to strengthen support system regarding legal claims 09/20: Patient receives limited support from attorney to  date; however, patient continues to follow up States interest in learning tools to cope with stressors and is open to referral to therapy. He prefers to speak with a male therapist and is open to telehealth and traveling to [place], [place] 09/06: Patient was successful in identifying healthy coping skills (working on car) Patient verbalized consent for a referral to [facility]. CCM LCSW will collaborate with PCP regarding referral 09/20: Patient has an upcoming appointment with [facility] 11/14: Patient reports good rapport with therapist during initial appointment. Follow up appt is scheduled for today 1/17: Patient discontinued services with therapist due to low rapport. He states symptoms have improved and he is "riding it out" LCSW encouraged patient to re-start services through another agency or with another counselor. He is not interested in services currently CCM LCSW discussed strategies to cope with grief through holidays 1/17: Strategies to assist with stress management, including, emotional snacking/eating discussed. Patient has an upcoming physical Patient reports frustration with treatment of CMA through MyChart regarding questions of upcoming appt. CCM LCSW will Special educational needs teacher and PCP of patient's negative experience Depression screen reviewed , Solution-Focused Strategies, Active listening / Reflection utilized , Emotional Supportive Provided, Provided psychoeducation for mental health needs , Participation in counseling encouraged , Verbalization of feelings encouraged , Crisis Resource Education / information provided , and Suicidal Ideation/Homicidal Ideation assessed: ; Patient Goals/Self-Care Activities: Over the next 120 days - participation in mental health treatment encouraged - self-awareness of emotional triggers encouraged - strategies to manage emotional triggers promoted - talk about feelings with a friend, family or spiritual advisor Contact clinic with any questions or  concerns Attend scheduled appointments with providers         Christa See, MSW, [email or web address] Phone [phone] 11:30 AM

## 2021-10-27 ENCOUNTER — Other Ambulatory Visit: Payer: Self-pay | Admitting: Internal Medicine

## 2021-10-27 NOTE — Telephone Encounter (Signed)
Requested Prescriptions  Pending Prescriptions Disp Refills   ezetimibe (ZETIA) 10 MG tablet [Pharmacy Med Name: EZETIMIBE 10 MG TAB] 90 tablet 3    Sig: TAKE 1 TABLET BY MOUTH DAILY     Cardiovascular:  Antilipid - Sterol Transport Inhibitors Failed - 10/27/2021  3:23 PM      Failed - LDL in normal range and within 360 days    LDL Cholesterol (Calc)  Date Value Ref Range Status  09/26/2021 100 (H) mg/dL (calc) Final    Comment:    Reference range: <100 . Desirable range <100 mg/dL for primary prevention;   <70 mg/dL for patients with CHD or diabetic patients  with > or = 2 CHD risk factors. [place] Kitchen LDL-C is now calculated using the Martin-Hopkins  calculation, which is a validated novel method providing  better accuracy than the Friedewald equation in the  estimation of LDL-C.  Cresenciano Genre et al. Annamaria Helling. 5361;443(15): 2061-2068  (http://education.[employer].com/faq/FAQ164)    Direct LDL  Date Value Ref Range Status  12/16/2019 135.0 mg/dL Final    Comment:    Optimal:  <100 mg/dLNear or Above Optimal:  100-129 mg/dLBorderline High:  130-159 mg/dLHigh:  160-189 mg/dLVery High:  >190 mg/dL         Failed - HDL in normal range and within 360 days    HDL  Date Value Ref Range Status  09/26/2021 37 (L) > OR = 40 mg/dL Final         Failed - Triglycerides in normal range and within 360 days    Triglycerides  Date Value Ref Range Status  09/26/2021 250 (H) <150 mg/dL Final    Comment:    . If a non-fasting specimen was collected, consider repeat triglyceride testing on a fasting specimen if clinically indicated.  Yates Decamp et al. J. of Clin. Lipidol. 4008;6:761-950. Mar[place] Kitchen          Passed - Total Cholesterol in normal range and within 360 days    Cholesterol  Date Value Ref Range Status  09/26/2021 173 <200 mg/dL Final         Passed - Valid encounter within last 12 months    Recent Outpatient Visits          1 month ago Rectal mass   [facility] [place],  Coralie Keens, NP   2 months ago Family history of brain aneurysm   [place], Coralie Keens, NP   6 months ago DDD (degenerative disc disease), cervical   [place], NP   8 months ago Other fatigue   [facility] [place], Coralie Keens, NP      Future Appointments            In 5 days [place], Coralie Keens, NP [facility], [facility]

## 2021-11-01 ENCOUNTER — Encounter: Payer: Self-pay | Admitting: Internal Medicine

## 2021-11-01 ENCOUNTER — Ambulatory Visit (INDEPENDENT_AMBULATORY_CARE_PROVIDER_SITE_OTHER): Payer: Medicare HMO | Admitting: Internal Medicine

## 2021-11-01 VITALS — BP 139/70 | HR 63 | Temp 98.1°F | Ht 74.0 in | Wt 267.0 lb

## 2021-11-01 DIAGNOSIS — R3911 Hesitancy of micturition: Secondary | ICD-10-CM

## 2021-11-01 DIAGNOSIS — Z1211 Encounter for screening for malignant neoplasm of colon: Secondary | ICD-10-CM

## 2021-11-01 MED ORDER — BUPROPION HCL ER (XL) 300 MG PO TB24: 300.0000 mg | ORAL_TABLET | Freq: Every day | ORAL | 1 refills | Status: DC

## 2021-11-01 MED ORDER — CITALOPRAM HYDROBROMIDE 40 MG PO TABS: 40.0000 mg | ORAL_TABLET | Freq: Every day | ORAL | 1 refills | Status: DC

## 2021-11-01 MED ORDER — PREDNISONE 10 MG PO TABS: ORAL_TABLET | ORAL | 0 refills | Status: DC

## 2021-11-01 MED ORDER — TAMSULOSIN HCL 0.4 MG PO CAPS: 0.8000 mg | ORAL_CAPSULE | Freq: Every day | ORAL | 1 refills | Status: DC

## 2021-11-01 MED ORDER — PANTOPRAZOLE SODIUM 40 MG PO TBEC: 40.0000 mg | DELAYED_RELEASE_TABLET | Freq: Every day | ORAL | 1 refills | Status: DC

## 2021-11-01 MED ORDER — MONTELUKAST SODIUM 10 MG PO TABS: 10.0000 mg | ORAL_TABLET | Freq: Every day | ORAL | 1 refills | Status: DC

## 2021-11-01 NOTE — Patient Instructions (Signed)

## 2021-11-01 NOTE — Progress Notes (Signed)
Subjective:    Patient ID: Corey Bell, male    DOB: 04/27/1966, 56 y.o.   MRN: [id number]  HPI  Patient presents the clinic today for his annual exam.  He reports headache, facial pain and pressure, nasal congestion, left ear pain, cough. This started 1 week ago.  The headache is located in his forehead.  He describes the pain as pressure.  He is blowing clear mucus out of his nose.  He denies ear drainage or loss of hearing.  The cough is productive of white mucus.  He denies runny nose, sore throat, shortness of breath, fever, chills or body aches.  He has tried Mucinex OTC with minimal relief of symptoms.  Flu: Never Tetanus: 12/2017 COVID: Moderna x2 Pneumovax: 06/2020 Shingrix: 03/2018, 05/2018 PSA screening: 12/2019 Colon screening: 01/2017, Cologuard Vision screening: annually, [facility] Dentist: biannually  Diet: He does eat meat. He consumes some fruits and veggies. He tries to avoid fried foods. He drinks mostly coffee, water, sweet tea and lemonade. 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 90 tablet 0   calcium carbonate  (TUMS - DOSED IN MG ELEMENTAL CALCIUM) 500 MG chewable tablet Chew 3 tablets by mouth daily.     citalopram (CELEXA) 40 MG tablet TAKE 1 TABLET BY MOUTH DAILY 90 tablet 3   clonazePAM (KLONOPIN) 0.5 MG tablet Take 1 tablet (0.5 mg total) by mouth daily as needed for anxiety. 20 tablet 0   cyclobenzaprine (FLEXERIL) 10 MG tablet Take 1 tablet (10 mg total) by mouth at bedtime. 30 tablet 5   diphenhydrAMINE (BENADRYL) 25 mg capsule Take 50 mg by mouth at bedtime.     erythromycin with ethanol (THERAMYCIN) 2 % external solution APPLY TOPICALLY TWICE A DAY AS NEEDED 60 mL 0   ezetimibe (ZETIA) 10 MG tablet TAKE 1 TABLET BY MOUTH DAILY 90 tablet 3   fexofenadine (ALLEGRA) 180 MG tablet Take 180 mg by mouth daily.     finasteride (PROPECIA) 1 MG tablet Take 0.25 mg by mouth daily.     fluticasone (CUTIVATE) 0.05 % cream      fluticasone (FLONASE) 50 MCG/ACT nasal spray Place 1 spray into both nostrils 2 (two) times daily. 16 g 2   gabapentin (NEURONTIN) 800 MG tablet TAKE 1 TABLET BY MOUTH 4 TIMES DAILY 360 tablet 0   hydrOXYzine (VISTARIL) 25 MG capsule Take 1 capsule (25 mg total) by mouth every  8 (eight) hours as needed. 90 capsule 0   Ibuprofen 200 MG CAPS      levothyroxine (SYNTHROID) 75 MCG tablet TAKE 1 TABLET BY MOUTH DAILY 90 tablet 2   methocarbamol (ROBAXIN) 500 MG tablet TAKE 2 TABLETS BY MOUTH EVERY 6 HOURS ASNEEDED FOR MUSCLE SPASMS 120 tablet 0   montelukast (SINGULAIR) 10 MG tablet Take 1 tablet (10 mg total) by mouth at bedtime. 90 tablet 0   pantoprazole (PROTONIX) 40 MG tablet TAKE 1 TABLET BY MOUTH DAILY 90 tablet 3   pramoxine (PROCTOFOAM) 1 % foam Place 1 application rectally 3 (three) times daily as needed for anal itching. 15 g 0   Saw Palmetto 450 MG CAPS Take 4 capsules by mouth daily.      sodium chloride (OCEAN) 0.65 % SOLN nasal spray Place 1 spray into both nostrils as needed for congestion. 15 mL 0   tamsulosin (FLOMAX) 0.4 MG CAPS capsule Take 1 capsule (0.4 mg total)  by mouth 2 (two) times daily. (Patient taking differently: Take 0.8 mg by mouth at bedtime.) 180 capsule 0   traMADol (ULTRAM) 50 MG tablet TAKE 1 TABLET BY MOUTH EVERY 8 HOURS AS NEEDED FOR MODERATE TO SEVERE PAIN 30 tablet 0   No current facility-administered medications for this visit.    Allergies  Allergen Reactions   Augmentin [Amoxicillin-Pot Clavulanate] Diarrhea   Demerol [Meperidine] Other (See Comments)    "goes crazy and has the strength of 10 men"   Eggs Or Egg-Derived Products Diarrhea and Nausea And Vomiting   Sulfamethoxazole Other (See Comments)    Blisters form in mouth   Statins Rash   Sulfa Antibiotics Rash   Sulfacetamide Sodium Rash    Family History  Problem Relation Age of Onset   Congestive Heart Failure Father    Heart disease Father    COPD Father    Kidney cancer Paternal Grandfather    Heart disease Brother        stents at 73 yo   Bladder Cancer Paternal Uncle    Lupus Brother    Heart failure Paternal Grandmother    Parkinsonism Neg Hx     Social History   Socioeconomic History   Marital status: Married    Spouse name: [name]   Number of children: 0   Years of education: college   Highest education level: Not on file  Occupational History   Occupation: disabled    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  Tobacco Use   Smoking status: Former    Packs/day: 2.00    Years: 20.00    Pack years: 40.00    Types: Cigarettes    Quit date: 10/09/2006    Years since quitting: 15.0   Smokeless tobacco: Never  Vaping Use   Vaping Use: Never used  Substance and Sexual Activity   Alcohol use: Yes    Alcohol/week: 1.0 standard drink    Types: 1 Cans of beer per week    Comment: rarely   Drug use: No   Sexual activity: Yes    Birth control/protection: Surgical  Other Topics Concern   Not on file  Social History Narrative   12/16/19   From: [place]   Living: with wife [name], since 1990   Work: [employer] one day a week       Family: mother and brothers are nearby      Enjoys: record collection with 10,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 - working on digitizing these, fixing up a 1968  mustang       Exercise: not currently   Diet: over-eating, tries to eat somewhat healthy - salad every other day      Safety   Seat belts: Yes    Guns: Yes  and secure   Safe in relationships: Yes    Social Determinants of Health   Financial Resource Strain: Not on file  Food Insecurity: No Food Insecurity   Worried About Charity fundraiser in the Last Year: Never true   Ran Out of Food in the Last Year: Never true  Transportation Needs: No Transportation Needs   Lack of Transportation (Medical): No   Lack of Transportation (Non-Medical): No  Physical Activity: Not on file  Stress: Not on file  Social Connections: Not on file  Intimate Partner Violence: Not on file     Constitutional: Patient reports headache.  Denies fever, malaise, fatigue, or abrupt weight changes.  HEENT: Patient reports facial pain and pressure, nasal congestion and left ear pain.  Denies eye pain, eye redness, ringing in the ears, wax buildup, runny nose, bloody nose, or sore throat. Respiratory: Patient reports cough.  Denies difficulty breathing, shortness of breath.   Cardiovascular: Denies chest pain, chest tightness, palpitations or swelling in the hands or feet.  Gastrointestinal: Denies abdominal pain, bloating, constipation, diarrhea or blood in the stool.  GU: Patient reports urinary hesitancy.  Denies urgency, frequency, pain with urination, burning sensation, blood in urine, odor or discharge. Musculoskeletal: Patient reports chronic back pain.  Denies decrease in range of motion, difficulty with gait, or joint swelling.  Skin: Denies redness, rashes, lesions or ulcercations.  Neurological: Denies dizziness, difficulty with memory, difficulty with speech or problems with balance and coordination.  Psych: Patient has a history of anxiety and depression.   Denies SI/HI.  No other specific complaints in a complete review of systems (except as listed in HPI above).  Objective:   Physical Exam   BP 139/70    Pulse 63    Temp 98.1 F (36.7 C) (Oral)    Ht 6\' 2"  (1.88 m)    Wt 267 lb (121.1 kg)    SpO2 98%    BMI 34.28 kg/m   Wt Readings from Last 3 Encounters:  09/26/21 265 lb 3.2 oz (120.3 kg)  08/25/21 261 lb 9.6 oz (118.7 kg)  04/25/21 262 lb (118.8 kg)    General: Appears his stated age, obese, in NAD. Skin: Warm, dry and intact. No ulcerations noted. HEENT: Head: normal shape and size, frontal sinus tenderness noted; Eyes: sclera white and EOMs intact; Ears: Tm's gray and intact, normal light reflex; Throat/Mouth: Teeth present, mucosa erythematous and moist, + PND, no exudate, lesions or ulcerations noted.  Neck:  Neck supple, trachea midline. No masses, lumps or thyromegaly present.  Cardiovascular: Normal rate and rhythm. S1,S2 noted.  No murmur, rubs or gallops noted. No JVD or BLE edema. No carotid bruits noted. Pulmonary/Chest: Normal effort and positive vesicular breath sounds. No respiratory distress. No wheezes, rales or ronchi noted.  Abdomen: Soft and nontender. Normal bowel sounds.  Musculoskeletal: Strength 5/5 BUE/BLE.  No difficulty with gait.  Neurological: Alert and oriented. Cranial nerves II-XII grossly intact. Coordination normal.  Psychiatric: Mood and affect normal. Behavior is normal. Judgment and thought content normal.    BMET    Component Value Date/Time   NA 141 09/26/2021 0938   NA 142 01/27/2018 0000   NA 138 12/26/2012 1936   K 5.0 09/26/2021 0938   K 3.9  12/26/2012 1936   CL 103 09/26/2021 0938   CL 106 12/26/2012 1936   CO2 30 09/26/2021 0938   CO2 24 12/26/2012 1936   GLUCOSE 85 09/26/2021 0938   GLUCOSE 124 (H) 12/26/2012 1936   BUN 14 09/26/2021 0938   BUN 19 01/27/2018 0000   BUN 22 (H) 12/26/2012 1936   CREATININE 1.23 09/26/2021 0938   CALCIUM 9.4 09/26/2021 0938   CALCIUM 8.4 (L)  12/26/2012 1936   GFRNONAA >60 06/02/2020 0431   GFRNONAA 58 (L) 12/26/2012 1936   GFRAA >60 06/02/2020 0431   GFRAA >60 12/26/2012 1936    Lipid Panel     Component Value Date/Time   CHOL 173 09/26/2021 0938   TRIG 250 (H) 09/26/2021 0938   HDL 37 (L) 09/26/2021 0938   CHOLHDL 4.7 09/26/2021 0938   VLDL 29.8 06/10/2020 0919   LDLCALC 100 (H) 09/26/2021 0938    CBC    Component Value Date/Time   WBC 5.6 09/26/2021 0938   RBC 4.94 09/26/2021 0938   HGB 15.5 09/26/2021 0938   HGB 15.2 12/26/2012 1936   HCT 45.7 09/26/2021 0938   HCT 44.6 12/26/2012 1936   PLT 205 09/26/2021 0938   PLT 218 12/26/2012 1936   MCV 92.5 09/26/2021 0938   MCV 94 12/26/2012 1936   MCH 31.4 09/26/2021 0938   MCHC 33.9 09/26/2021 0938   RDW 12.3 09/26/2021 0938   RDW 13.3 12/26/2012 1936   LYMPHSABS 1.9 06/02/2020 0431   MONOABS 0.4 06/02/2020 0431   EOSABS 0.2 06/02/2020 0431   BASOSABS 0.1 06/02/2020 0431    Hgb A1C Lab Results  Component Value Date   HGBA1C 5.8 (H) 09/26/2021           Assessment & Plan:   Preventative Health Maintenance:  He declines flu shot Tetanus UTD Pneumovax UTD Encouraged him to get his COVID booster Cologuard ordered Encouraged him to consume a balanced diet and exercise regimen Advised him to see an eye doctor and dentist annually Labs from December reviewed  Viral Sinusitis:  RX for Pred Taper x 6 days Can use a Nettie pot which can be purchased from your local pharmacy If worsens by the end of the week or early next week, would consider antibiotic therapy  RTC in 6 months, follow-up chronic conditions Webb Silversmith, NP This visit occurred during the SARS-CoV-2 public health emergency.  Safety protocols were in place, including screening questions prior to the visit, additional usage of staff PPE, and extensive cleaning of exam room while observing appropriate contact time as indicated for disinfecting solutions.

## 2021-11-07 ENCOUNTER — Encounter: Payer: Self-pay | Admitting: Internal Medicine

## 2021-11-08 DIAGNOSIS — F4323 Adjustment disorder with mixed anxiety and depressed mood: Secondary | ICD-10-CM

## 2021-11-08 DIAGNOSIS — E038 Other specified hypothyroidism: Secondary | ICD-10-CM

## 2021-11-08 DIAGNOSIS — E119 Type 2 diabetes mellitus without complications: Secondary | ICD-10-CM

## 2021-11-08 DIAGNOSIS — I10 Essential (primary) hypertension: Secondary | ICD-10-CM

## 2021-11-11 ENCOUNTER — Other Ambulatory Visit: Payer: Self-pay | Admitting: Internal Medicine

## 2021-11-11 NOTE — Telephone Encounter (Signed)
Requested medication (s) are due for refill today: yes  Requested medication (s) are on the active medication list: yes  Last refill:  vistaril- 02/28/21 #90 0 refills, ultram - 07/25/21 #30 0 refill  Future visit scheduled:  yes in 5 months  Notes to clinic:  not delegated per protocol. Do you want to refill Rx?     Requested Prescriptions  Pending Prescriptions Disp Refills   hydrOXYzine (VISTARIL) 25 MG capsule [Pharmacy Med Name: HYDROXYZINE PAMOATE 25 MG CAP] 90 capsule 0    Sig: TAKE 1 CAPSULE BY MOUTH EVERY 8 HOURS ASNEEDED     Ear, Nose, and Throat:  Antihistamines 2 Passed - 11/11/2021  2:09 PM      Passed - Cr in normal range and within 360 days    Creat  Date Value Ref Range Status  09/26/2021 1.23 0.70 - 1.30 mg/dL Final   Creatinine,U  Date Value Ref Range Status  09/07/2014 133.2 mg/dL Final          Passed - Valid encounter within last 12 months    Recent Outpatient Visits           1 week ago Screen for colon cancer   [facility] [place], Coralie Keens, NP   1 month ago Rectal mass   [facility] [place], Coralie Keens, NP   2 months ago Family history of brain aneurysm   [facility], Coralie Keens, NP   6 months ago DDD (degenerative disc disease), cervical   [facility], Coralie Keens, NP   8 months ago Other fatigue   [facility] [place], Coralie Keens, NP       Future Appointments             In 5 months Baity, Coralie Keens, NP [facility]             traMADol (ULTRAM) 50 MG tablet [Pharmacy Med Name: TRAMADOL HCL 50 MG TAB] 30 tablet     Sig: TAKE 1 TABLET BY MOUTH EVERY 8 HOURS AS NEEDED FOR MODERATE TO SEVERE PAIN     Not Delegated - Analgesics:  Opioid Agonists Failed - 11/11/2021  2:09 PM      Failed - This refill cannot be delegated      Failed - Urine Drug Screen completed in last 360 days      Passed - Valid encounter within last 3 months    Recent  Outpatient Visits           1 week ago Screen for colon cancer   [place], Coralie Keens, NP   1 month ago Rectal mass   [facility] [place], Coralie Keens, NP   2 months ago Family history of brain aneurysm   [place], Coralie Keens, NP   6 months ago DDD (degenerative disc disease), cervical   [facility], Coralie Keens, NP   8 months ago Other fatigue   [facility] [place], Coralie Keens, NP       Future Appointments             In 5 months Baity, Coralie Keens, NP [facility], [facility]

## 2021-11-13 DIAGNOSIS — Z1211 Encounter for screening for malignant neoplasm of colon: Secondary | ICD-10-CM | POA: Diagnosis not present

## 2021-11-16 ENCOUNTER — Encounter: Payer: Self-pay | Admitting: Internal Medicine

## 2021-11-17 MED ORDER — DOXYCYCLINE HYCLATE 100 MG PO TABS: 100.0000 mg | ORAL_TABLET | Freq: Two times a day (BID) | ORAL | 0 refills | Status: DC

## 2021-11-21 LAB — COLOGUARD: COLOGUARD: POSITIVE — AB

## 2021-11-22 ENCOUNTER — Encounter: Payer: Self-pay | Admitting: Internal Medicine

## 2021-11-22 DIAGNOSIS — R195 Other fecal abnormalities: Secondary | ICD-10-CM

## 2021-11-23 ENCOUNTER — Other Ambulatory Visit: Payer: Self-pay

## 2021-11-23 DIAGNOSIS — R195 Other fecal abnormalities: Secondary | ICD-10-CM

## 2021-11-23 MED ORDER — PEG 3350-KCL-NA BICARB-NACL 420 G PO SOLR: 4000.0000 mL | Freq: Once | ORAL | 0 refills | Status: AC

## 2021-11-23 NOTE — Progress Notes (Signed)
Gastroenterology Pre-Procedure Review  Request Date: 12/27/2021 Requesting Physician: Dr. Vicente Males  PATIENT REVIEW QUESTIONS: The patient responded to the following health history questions as indicated:    1. Are you having any GI issues? no 2. Do you have a personal history of Polyps? no 3. Do you have a family history of Colon Cancer or Polyps? no 4. Diabetes Mellitus? yes (Type II) 5. Joint replacements in the past 12 months?no 6. Major health problems in the past 3 months?no 7. Any artificial heart valves, MVP, or defibrillator?no    MEDICATIONS & ALLERGIES:    Patient reports the following regarding taking any anticoagulation/antiplatelet therapy:   Plavix, Coumadin, Eliquis, Xarelto, Lovenox, Pradaxa, Brilinta, or Effient? no Aspirin? no  Patient confirms/reports the following medications:  Current Outpatient Medications  Medication Sig Dispense Refill   buPROPion (WELLBUTRIN XL) 300 MG 24 hr tablet Take 1 tablet (300 mg total) by mouth daily. 90 tablet 1   calcium carbonate (TUMS - DOSED IN MG ELEMENTAL CALCIUM) 500 MG chewable tablet Chew 3 tablets by mouth daily.     citalopram (CELEXA) 40 MG tablet Take 1 tablet (40 mg total) by mouth daily. 90 tablet 1   clonazePAM (KLONOPIN) 0.5 MG tablet Take 1 tablet (0.5 mg total) by mouth daily as needed for anxiety. 20 tablet 0   cyclobenzaprine (FLEXERIL) 10 MG tablet Take 1 tablet (10 mg total) by mouth at bedtime. 30 tablet 5   diphenhydrAMINE (BENADRYL) 25 mg capsule Take 50 mg by mouth at bedtime.     doxycycline (VIBRA-TABS) 100 MG tablet Take 1 tablet (100 mg total) by mouth 2 (two) times daily. 20 tablet 0   erythromycin with ethanol (THERAMYCIN) 2 % external solution APPLY TOPICALLY TWICE A DAY AS NEEDED 60 mL 0   ezetimibe (ZETIA) 10 MG tablet TAKE 1 TABLET BY MOUTH DAILY 90 tablet 3   fexofenadine (ALLEGRA) 180 MG tablet Take 180 mg by mouth daily.     finasteride (PROPECIA) 1 MG tablet Take 0.25 mg by mouth daily.      fluticasone (CUTIVATE) 0.05 % cream      fluticasone (FLONASE) 50 MCG/ACT nasal spray Place 1 spray into both nostrils 2 (two) times daily. 16 g 2   gabapentin (NEURONTIN) 800 MG tablet TAKE 1 TABLET BY MOUTH 4 TIMES DAILY 360 tablet 0   hydrOXYzine (VISTARIL) 25 MG capsule TAKE 1 CAPSULE BY MOUTH EVERY 8 HOURS ASNEEDED 90 capsule 0   Ibuprofen 200 MG CAPS      levothyroxine (SYNTHROID) 75 MCG tablet TAKE 1 TABLET BY MOUTH DAILY 90 tablet 2   methocarbamol (ROBAXIN) 500 MG tablet TAKE 2 TABLETS BY MOUTH EVERY 6 HOURS ASNEEDED FOR MUSCLE SPASMS 120 tablet 0   montelukast (SINGULAIR) 10 MG tablet Take 1 tablet (10 mg total) by mouth at bedtime. 90 tablet 1   pantoprazole (PROTONIX) 40 MG tablet Take 1 tablet (40 mg total) by mouth daily. 90 tablet 1   pramoxine (PROCTOFOAM) 1 % foam Place 1 application rectally 3 (three) times daily as needed for anal itching. 15 g 0   predniSONE (DELTASONE) 10 MG tablet Take 6 tabs on day 1, 5 tabs on day 2, 4 tabs on day 3, 3 tabs on day 4, 2 tabs on day 5, 1 tab on day 6 21 tablet 0   Saw Palmetto 450 MG CAPS Take 4 capsules by mouth daily.      sodium chloride (OCEAN) 0.65 % SOLN nasal spray Place 1 spray into both nostrils  as needed for congestion. 15 mL 0   tamsulosin (FLOMAX) 0.4 MG CAPS capsule Take 2 capsules (0.8 mg total) by mouth at bedtime. 60 capsule 1   traMADol (ULTRAM) 50 MG tablet TAKE 1 TABLET BY MOUTH EVERY 8 HOURS AS NEEDED FOR MODERATE TO SEVERE PAIN 30 tablet 0   No current facility-administered medications for this visit.    Patient confirms/reports the following allergies:  Allergies  Allergen Reactions   Augmentin [Amoxicillin-Pot Clavulanate] Diarrhea   Demerol [Meperidine] Other (See Comments)    "goes crazy and has the strength of 10 men"   Eggs Or Egg-Derived Products Diarrhea and Nausea And Vomiting   Sulfamethoxazole Other (See Comments)    Blisters form in mouth   Statins Rash   Sulfa Antibiotics Rash   Sulfacetamide  Sodium Rash    No orders of the defined types were placed in this encounter.   AUTHORIZATION INFORMATION Primary Insurance: 1D#: Group #:  Secondary Insurance: 1D#: Group #:  SCHEDULE INFORMATION: Date: 12/27/2021 Time: Location: Vicente Males

## 2021-11-28 ENCOUNTER — Other Ambulatory Visit: Payer: Self-pay | Admitting: Internal Medicine

## 2021-11-29 NOTE — Telephone Encounter (Signed)
Requested medication (s) are due for refill today: yes  Requested medication (s) are on the active medication list: yes  Last refill:  10/12/21 #120/0  Future visit scheduled: yes  Notes to clinic:  Unable to refill per protocol, cannot delegate.      Requested Prescriptions  Pending Prescriptions Disp Refills   methocarbamol (ROBAXIN) 500 MG tablet [Pharmacy Med Name: METHOCARBAMOL 500 MG TAB] 120 tablet 0    Sig: TAKE 2 TABLETS BY MOUTH EVERY 6 HOURS ASNEEDED FOR MUSCLE SPASMS     Not Delegated - Analgesics:  Muscle Relaxants Failed - 11/28/2021  1:20 PM      Failed - This refill cannot be delegated      Passed - Valid encounter within last 6 months    Recent Outpatient Visits           4 weeks ago Screen for colon cancer   [facility] [place], Coralie Keens, NP   2 months ago Rectal mass   [facility] [employer], Coralie Keens, NP   3 months ago Family history of brain aneurysm   [place], Coralie Keens, NP   7 months ago DDD (degenerative disc disease), cervical   [facility] [place], Coralie Keens, NP   9 months ago Other fatigue   [facility], Coralie Keens, NP       Future Appointments             In 5 months Baity, Coralie Keens, NP [facility]

## 2021-11-29 NOTE — Telephone Encounter (Signed)
Is he taking this 4 times a day every day?  Does he need it that often?

## 2021-11-30 NOTE — Telephone Encounter (Signed)
Pt states he take two at bedtime every night.  On a rare occasion he takes one during the day.   Thanks,   -Mickel Baas

## 2021-12-01 ENCOUNTER — Telehealth: Payer: Self-pay

## 2021-12-01 NOTE — Telephone Encounter (Signed)
Copied from [place] [id number]. Topic: General - Call Back - No Documentation >> Nov 30, 2021  3:38 PM Erick Blinks wrote: Reason for CRM: Dawn from [facility] needs a call back to clarify the medication instructions for methocarbamol. Please call back and advise

## 2021-12-05 ENCOUNTER — Encounter: Payer: Self-pay | Admitting: Internal Medicine

## 2021-12-06 NOTE — Telephone Encounter (Signed)
methocarbamol (ROBAXIN) 500 MG tablet 90 tablet 0 11/30/2021    Sig - Route: Take 1 tablet (500 mg total) by mouth every 8 (eight) hours as needed for muscle spasms. TAKE 2 TABLETS BY MOUTH EVERY 6 HOURS ASNEEDED FOR MUSCLE SPASMS - Oral   Sent to pharmacy as: methocarbamol (ROBAXIN) 500 MG tablet   E-Prescribing Status: Receipt confirmed by pharmacy (11/30/2021  3:33 PM EST)   Pls fu to pharmacy as  "as needed" phrase still needs to be more clarified.

## 2021-12-06 NOTE — Telephone Encounter (Signed)
This is a duplicate message.  See Telephone encounter.   Thanks,   -Mickel Baas

## 2021-12-06 NOTE — Telephone Encounter (Signed)
Advised pharmacist the instructions are 1 every 8 hours as needed.   Thanks,   -Mickel Baas

## 2021-12-14 ENCOUNTER — Encounter: Payer: Self-pay | Admitting: Gastroenterology

## 2021-12-15 ENCOUNTER — Encounter
Admission: RE | Disposition: A | Discharge status: Home / Self Care | Payer: Self-pay | Source: Home / Self Care | Attending: Gastroenterology

## 2021-12-15 ENCOUNTER — Ambulatory Visit: Payer: Medicare HMO | Admitting: Certified Registered"

## 2021-12-15 ENCOUNTER — Telehealth: Payer: Self-pay

## 2021-12-15 ENCOUNTER — Encounter: Payer: Self-pay | Admitting: Gastroenterology

## 2021-12-15 ENCOUNTER — Ambulatory Visit
Admission: RE | Admit: 2021-12-15 | Discharge: 2021-12-15 | Disposition: A | Discharge status: Home / Self Care | Payer: Medicare HMO | Attending: Gastroenterology | Admitting: Gastroenterology

## 2021-12-15 ENCOUNTER — Telehealth: Payer: Self-pay | Admitting: Gastroenterology

## 2021-12-15 DIAGNOSIS — I10 Essential (primary) hypertension: Secondary | ICD-10-CM | POA: Insufficient documentation

## 2021-12-15 DIAGNOSIS — F419 Anxiety disorder, unspecified: Secondary | ICD-10-CM | POA: Diagnosis not present

## 2021-12-15 DIAGNOSIS — Z87891 Personal history of nicotine dependence: Secondary | ICD-10-CM | POA: Insufficient documentation

## 2021-12-15 DIAGNOSIS — E119 Type 2 diabetes mellitus without complications: Secondary | ICD-10-CM | POA: Insufficient documentation

## 2021-12-15 DIAGNOSIS — R195 Other fecal abnormalities: Secondary | ICD-10-CM | POA: Diagnosis not present

## 2021-12-15 DIAGNOSIS — E785 Hyperlipidemia, unspecified: Secondary | ICD-10-CM | POA: Diagnosis not present

## 2021-12-15 DIAGNOSIS — F32A Depression, unspecified: Secondary | ICD-10-CM | POA: Diagnosis not present

## 2021-12-15 DIAGNOSIS — Z1211 Encounter for screening for malignant neoplasm of colon: Secondary | ICD-10-CM | POA: Diagnosis not present

## 2021-12-15 DIAGNOSIS — G894 Chronic pain syndrome: Secondary | ICD-10-CM | POA: Insufficient documentation

## 2021-12-15 DIAGNOSIS — J449 Chronic obstructive pulmonary disease, unspecified: Secondary | ICD-10-CM | POA: Insufficient documentation

## 2021-12-15 DIAGNOSIS — E039 Hypothyroidism, unspecified: Secondary | ICD-10-CM | POA: Diagnosis not present

## 2021-12-15 DIAGNOSIS — G4730 Sleep apnea, unspecified: Secondary | ICD-10-CM | POA: Insufficient documentation

## 2021-12-15 DIAGNOSIS — K219 Gastro-esophageal reflux disease without esophagitis: Secondary | ICD-10-CM | POA: Insufficient documentation

## 2021-12-15 DIAGNOSIS — E782 Mixed hyperlipidemia: Secondary | ICD-10-CM | POA: Diagnosis not present

## 2021-12-15 HISTORY — PX: COLONOSCOPY WITH PROPOFOL: SHX5780

## 2021-12-15 SURGERY — COLONOSCOPY WITH PROPOFOL
Anesthesia: General

## 2021-12-15 MED ORDER — SODIUM CHLORIDE 0.9 % IV SOLN: INTRAVENOUS | Status: DC

## 2021-12-15 MED ORDER — PROPOFOL 10 MG/ML IV BOLUS
INTRAVENOUS | Status: DC | PRN
  Administered 2021-12-15: 70 mg via INTRAVENOUS

## 2021-12-15 MED ORDER — PROPOFOL 500 MG/50ML IV EMUL
INTRAVENOUS | Status: DC | PRN
  Administered 2021-12-15: 165 ug/kg/min via INTRAVENOUS

## 2021-12-15 MED ORDER — PROPOFOL 500 MG/50ML IV EMUL
INTRAVENOUS | Status: AC
  Filled 2021-12-15: qty 50

## 2021-12-15 NOTE — Telephone Encounter (Signed)
I will reschedule patient what prep do u want him to have are we doing a 2day prep or is there any special instructions patient is diabetic  ?

## 2021-12-15 NOTE — Op Note (Signed)
[facility] ?Gastroenterology ?Patient Name: Corey Bell ?Procedure Date: 12/15/2021 8:42 AM ?MRN: [id number] ?Account #: [id number] ?Date of Birth: 01/30/1966 ?Admit Type: Outpatient ?Age: 56 ?Room: [facility] ENDO ROOM 3 ?Gender: Male ?Note Status: Finalized ?Instrument Name: Colonoscope [id number] ?Procedure:             Colonoscopy ?Indications:           Screening for colorectal malignant neoplasm ?Providers:             Jonathon Bellows MD, MD ?Referring MD:          Jearld Fenton (Referring MD) ?Medicines:             Monitored Anesthesia Care ?Complications:         No immediate complications. ?Procedure:             Pre-Anesthesia Assessment: ?                       - Prior to the procedure, a History and Physical was  ?                       performed, and patient medications, allergies and  ?                       sensitivities were reviewed. The patient's tolerance  ?                       of previous anesthesia was reviewed. ?                       - The risks and benefits of the procedure and the  ?                       sedation options and risks were discussed with the  ?                       patient. All questions were answered and informed  ?                       consent was obtained. ?                       - ASA Grade Assessment: II - A patient with mild  ?                       systemic disease. ?                       After obtaining informed consent, the colonoscope was  ?                       passed under direct vision. Throughout the procedure,  ?                       the patient's blood pressure, pulse, and oxygen  ?                       saturations were monitored continuously. The  ?                       Colonoscope was introduced  through the anus and  ?                       advanced to the the cecum, identified by the  ?                       appendiceal orifice. The colonoscopy was performed  ?                       with ease. The patient tolerated the procedure well.  ?                        The quality of the bowel preparation was poor. ?Findings: ?     The perianal and digital rectal examinations were normal. ?     A large amount of semi-liquid stool was found in the entire colon,  ?     interfering with visualization. ?Impression:            - Preparation of the colon was poor. ?                       - Stool in the entire examined colon. ?                       - No specimens collected. ?Recommendation:        - Discharge patient to home (with escort). ?                       - Resume previous diet. ?                       - Continue present medications. ?                       - Repeat colonoscopy in 2 weeks because the bowel  ?                       preparation was suboptimal. ?Procedure Code(s):     --- Professional --- ?                       [id number], Colonoscopy, flexible; diagnostic, including  ?                       collection of specimen(s) by brushing or washing, when  ?                       performed (separate procedure) ?Diagnosis Code(s):     --- Professional --- ?                       Z12.11, Encounter for screening for malignant neoplasm  ?                       of colon ?CPT copyright 2019 American Medical Association. All rights reserved. ?The codes documented in this report are preliminary and upon coder review may  ?be revised to meet current compliance requirements. ?Jonathon Bellows, MD ?Jonathon Bellows MD, MD ?12/15/2021 8:57:18 AM ?This report has been signed electronically. ?Number of Addenda: 0 ?Note Initiated On: 12/15/2021 8:42 AM ?Scope Withdrawal Time: 0 hours 1 minute 56  seconds  ?Total Procedure Duration: 0 hours 7 minutes 27 seconds  ?Estimated Blood Loss:  Estimated blood loss: none. ?     [facility] ?

## 2021-12-15 NOTE — Transfer of Care (Signed)
Immediate Anesthesia Transfer of Care Note ? ?Patient: Corey Bell ? ?Procedure(s) Performed: COLONOSCOPY WITH PROPOFOL ? ?Patient Location: Endoscopy Unit ? ?Anesthesia Type:General ? ?Level of Consciousness: drowsy ? ?Airway & Oxygen Therapy: Patient Spontanous Breathing ? ?Post-op Assessment: Report given to RN and Post -op Vital signs reviewed and stable ? ?Post vital signs: Reviewed and stable ? ?Last Vitals:  ?Vitals Value Taken Time  ?BP 99/74 12/15/21 0900  ?Temp 36.1 ?C 12/15/21 0900  ?Pulse    ?Resp 12 12/15/21 0900  ?SpO2 100 % 12/15/21 0900  ? ? ?Last Pain:  ?Vitals:  ? 12/15/21 0900  ?TempSrc: Temporal  ?PainSc: Asleep  ?   ? ?  ? ?Complications: No notable events documented. ?

## 2021-12-15 NOTE — Anesthesia Preprocedure Evaluation (Signed)
Anesthesia Evaluation  ?Patient identified by MRN, date of birth, ID band ?Patient awake ? ? ? ?Reviewed: ?Allergy & Precautions, H&P , NPO status , Patient's Chart, lab work & pertinent test results, reviewed documented beta blocker date and time  ? ?History of Anesthesia Complications ?Negative for: history of anesthetic complications ? ?Airway ?Mallampati: I ? ?TM Distance: >3 FB ?Neck ROM: full ? ? ? Dental ? ?(+) Dental Advidsory Given, Caps, Chipped, Missing, Teeth Intact ?  ?Pulmonary ?neg shortness of breath, sleep apnea and Continuous Positive Airway Pressure Ventilation , neg COPD, neg recent URI, former smoker,  ?  ?Pulmonary exam normal ?breath sounds clear to auscultation ? ? ? ? ? ? Cardiovascular ?Exercise Tolerance: Good ?hypertension, (-) angina(-) Past MI and (-) Cardiac Stents Normal cardiovascular exam(-) dysrhythmias (-) Valvular Problems/Murmurs ?Rhythm:regular Rate:Normal ? ? ?  ?Neuro/Psych ?PSYCHIATRIC DISORDERS Anxiety Depression negative neurological ROS ?   ? GI/Hepatic ?Neg liver ROS, GERD  ,  ?Endo/Other  ?diabetes (borderline), Well Controlled, Type 2Hypothyroidism  ? Renal/GU ?negative Renal ROS  ?negative genitourinary ?  ?Musculoskeletal ? ? Abdominal ?  ?Peds ? Hematology ?negative hematology ROS ?(+)   ?Anesthesia Other Findings ?Past Medical History: ?No date: Anxiety ?10/04/2012: Aphthous ulcer ?    Comment:  improving ?No date: Arthritis ?    Comment:  left index fnger ?No date: Carpal tunnel syndrome, bilateral ?    Comment:  damaged nerve in neck ?No date: Chronic pain syndrome ?No date: COPD (chronic obstructive pulmonary disease) ([place]) ?    Comment:  mild ?No date: Depression ?No date: Diverticulitis of colon ?No date: Esophageal reflux ?No date: H/O acute prostatitis ?No date: Hypothyroidism ?No date: Insomnia, unspecified ?No date: Mixed hyperlipidemia ?12/20/2012: Peyronie disease ?    Comment:  PER uROLOGY ?No date: Seasonal  allergies ?No date: Sleep apnea ?    Comment:  uses cpap ?No date: Tympanic membrane perforation ?    Comment:  history of ? ? Reproductive/Obstetrics ?negative OB ROS ? ?  ? ? ? ? ? ? ? ? ? ? ? ? ? ?  ?  ? ? ? ? ? ? ? ? ?Anesthesia Physical ?Anesthesia Plan ? ?ASA: 3 ? ?Anesthesia Plan: General  ? ?Post-op Pain Management:   ? ?Induction: Intravenous ? ?PONV Risk Score and Plan: 2 and Propofol infusion and TIVA ? ?Airway Management Planned: Natural Airway and Nasal Cannula ? ?Additional Equipment:  ? ?Intra-op Plan:  ? ?Post-operative Plan:  ? ?Informed Consent: I have reviewed the patients History and Physical, chart, labs and discussed the procedure including the risks, benefits and alternatives for the proposed anesthesia with the patient or authorized representative who has indicated his/her understanding and acceptance.  ? ? ? ?Dental Advisory Given ? ?Plan Discussed with: Anesthesiologist, CRNA and Surgeon ? ?Anesthesia Plan Comments:   ? ? ? ? ? ? ?Anesthesia Quick Evaluation ? ?

## 2021-12-15 NOTE — Anesthesia Postprocedure Evaluation (Signed)
Anesthesia Post Note ? ?Patient: Corey Bell ? ?Procedure(s) Performed: COLONOSCOPY WITH PROPOFOL ? ?Patient location during evaluation: Endoscopy ?Anesthesia Type: General ?Level of consciousness: awake and alert ?Pain management: pain level controlled ?Vital Signs Assessment: post-procedure vital signs reviewed and stable ?Respiratory status: spontaneous breathing, nonlabored ventilation, respiratory function stable and patient connected to nasal cannula oxygen ?Cardiovascular status: blood pressure returned to baseline and stable ?Postop Assessment: no apparent nausea or vomiting ?Anesthetic complications: no ? ? ?No notable events documented. ? ? ?Last Vitals:  ?Vitals:  ? 12/15/21 0920 12/15/21 0930  ?BP: 124/77 127/74  ?Resp: 13 14  ?Temp:    ?SpO2: 98% 99%  ?  ?Last Pain:  ?Vitals:  ? 12/15/21 0930  ?TempSrc:   ?PainSc: 0-No pain  ? ? ?  ?  ?  ?  ?  ?  ? ?Martha Clan ? ? ? ? ?

## 2021-12-15 NOTE — H&P (Signed)
Jonathon Bellows, MD [address] [place], [place], [place], [address] [id number], [place], [place], [place], [address] Phone: [phone]  Fax: [phone]  Primary Care Physician:  Jearld Fenton, NP   Pre-Procedure History & Physical: HPI:  Corey Bell is a 56 y.o. male is here for an colonoscopy.   Past Medical History:  Diagnosis Date   Anxiety    Aphthous ulcer 10/04/2012   improving   Arthritis    left index fnger   Carpal tunnel syndrome, bilateral    damaged nerve in neck   Chronic pain syndrome    COPD (chronic obstructive pulmonary disease) ([facility])    mild   Depression    Diverticulitis of colon    Esophageal reflux    H/O acute prostatitis    Hypothyroidism    Insomnia, unspecified    Mixed hyperlipidemia    Peyronie disease 12/20/2012   PER uROLOGY   Seasonal allergies    Sleep apnea    uses cpap   Tympanic membrane perforation    history of    Past Surgical History:  Procedure Laterality Date   LAPAROSCOPIC GASTRIC SLEEVE RESECTION N/A 07/31/2017   Procedure: LAPAROSCOPIC GASTRIC SLEEVE RESECTION WITH UPPER ENDOSCOPY;  Surgeon: Alphonsa Overall, MD;  Location: [facility];  Service: General;  Laterality: N/A;   LumbarSacral Disc Surgery     L5-S1   MENISCUS REPAIR Left    torn meniscus   NESBIT PROCEDURE N/A 08/22/2013   Procedure: 16 DOT PLACTATION;  Surgeon: Claybon Jabs, MD;  Location: [facility];  Service: Urology;  Laterality: N/A;   TYMPANOSTOMY TUBE PLACEMENT  1975   ULNAR COLLATERAL LIGAMENT REPAIR Right 04/17/2019   Procedure: RIGHT THUMB ULNAR COLLATERAL LIGAMENT REPAIR;  Surgeon: Leanora Cover, MD;  Location: [facility];  Service: Orthopedics;  Laterality: Right;   ULNAR COLLATERAL LIGAMENT REPAIR Right 10/16/2019   Procedure: RIGHT THUMB ULNAR COLLATERAL LIGAMENT RECONSTRUCTION;  Surgeon: Leanora Cover, MD;  Location: [facility];  Service: Orthopedics;  Laterality: Right;  block in preop    VASECTOMY Bilateral 08/22/2013   Procedure: VASECTOMY;  Surgeon: Claybon Jabs, MD;  Location: [facility];  Service: Urology;  Laterality: Bilateral;    Prior to Admission medications   Medication Sig Start Date End Date Taking? Authorizing Provider  buPROPion (WELLBUTRIN XL) 300 MG 24 hr tablet Take 1 tablet (300 mg total) by mouth daily. 11/01/21  Yes Baity, Coralie Keens, NP  calcium carbonate (TUMS - DOSED IN MG ELEMENTAL CALCIUM) 500 MG chewable tablet Chew 3 tablets by mouth daily.   Yes [provider]  citalopram (CELEXA) 40 MG tablet Take 1 tablet (40 mg total) by mouth daily. 11/01/21  Yes Baity, Coralie Keens, NP  cyclobenzaprine (FLEXERIL) 10 MG tablet Take 1 tablet (10 mg total) by mouth at bedtime. 07/18/21  Yes Jearld Fenton, NP  ezetimibe (ZETIA) 10 MG tablet TAKE 1 TABLET BY MOUTH DAILY 10/27/21  Yes Baity, Coralie Keens, NP  fexofenadine (ALLEGRA) 180 MG tablet Take 180 mg by mouth daily.   Yes [provider]  finasteride (PROPECIA) 1 MG tablet Take 0.25 mg by mouth daily.   Yes [provider]  fluticasone (FLONASE) 50 MCG/ACT nasal spray Place 1 spray into both nostrils 2 (two) times daily. 11/02/20  Yes Jearld Fenton, NP  gabapentin (NEURONTIN) 800 MG tablet TAKE 1 TABLET BY MOUTH 4 TIMES DAILY 09/29/21  Yes Jearld Fenton, NP  hydrOXYzine (VISTARIL) 25 MG capsule TAKE 1 CAPSULE BY MOUTH EVERY 8 HOURS ASNEEDED 11/14/21  Yes Jearld Fenton, NP  levothyroxine (SYNTHROID) 75 MCG tablet TAKE 1 TABLET BY MOUTH DAILY 06/04/21  Yes Baity, Coralie Keens, NP  montelukast (SINGULAIR) 10 MG tablet Take 1 tablet (10 mg total) by mouth at bedtime. 11/01/21  Yes Jearld Fenton, NP  pantoprazole (PROTONIX) 40 MG tablet Take 1 tablet (40 mg total) by mouth daily. 11/01/21  Yes Jearld Fenton, NP  tamsulosin (FLOMAX) 0.4 MG CAPS capsule Take 2 capsules (0.8 mg total) by mouth at bedtime. 11/01/21  Yes Baity, Coralie Keens, NP  clonazePAM (KLONOPIN) 0.5 MG tablet Take 1  tablet (0.5 mg total) by mouth daily as needed for anxiety. 07/21/21   Jearld Fenton, NP  diphenhydrAMINE (BENADRYL) 25 mg capsule Take 50 mg by mouth at bedtime.    [provider]  doxycycline (VIBRA-TABS) 100 MG tablet Take 1 tablet (100 mg total) by mouth 2 (two) times daily. 11/17/21   Jearld Fenton, NP  erythromycin with ethanol [facility]) 2 % external solution APPLY TOPICALLY TWICE A DAY AS NEEDED 04/25/21   Jearld Fenton, NP  fluticasone (CUTIVATE) 0.05 % cream  05/27/21   [provider]  Ibuprofen 200 MG CAPS     [provider]  methocarbamol (ROBAXIN) 500 MG tablet Take 1 tablet (500 mg total) by mouth every 8 (eight) hours as needed for muscle spasms. TAKE 2 TABLETS BY MOUTH EVERY 6 HOURS ASNEEDED FOR MUSCLE SPASMS 11/30/21   Jearld Fenton, NP  pramoxine (PROCTOFOAM) 1 % foam Place 1 application rectally 3 (three) times daily as needed for anal itching. 08/25/21   Jearld Fenton, NP  predniSONE (DELTASONE) 10 MG tablet Take 6 tabs on day 1, 5 tabs on day 2, 4 tabs on day 3, 3 tabs on day 4, 2 tabs on day 5, 1 tab on day 6 11/01/21   Jearld Fenton, NP  Saw Palmetto 450 MG CAPS Take 4 capsules by mouth daily.     [provider]  sodium chloride (OCEAN) 0.65 % SOLN nasal spray Place 1 spray into both nostrils as needed for congestion. 06/17/20   Montine Circle, PA-C  traMADol (ULTRAM) 50 MG tablet TAKE 1 TABLET BY MOUTH EVERY 8 HOURS AS NEEDED FOR MODERATE TO SEVERE PAIN 11/14/21   Jearld Fenton, NP    Allergies as of 11/24/2021 - Review Complete 11/01/2021  Allergen Reaction Noted   Augmentin [amoxicillin-pot clavulanate] Diarrhea 11/24/2013   Demerol [meperidine] Other (See Comments) 07/01/2013   Eggs or egg-derived products Diarrhea and Nausea And Vomiting    Sulfamethoxazole Other (See Comments) 09/23/2015   Statins Rash    Sulfa antibiotics Rash    Sulfacetamide sodium Rash 12/10/2014    Family History  Problem Relation Age of Onset    Congestive Heart Failure Father    Heart disease Father    COPD Father    Kidney cancer Paternal Grandfather    Heart disease Brother        stents at 8 yo   Bladder Cancer Paternal Uncle    Lupus Brother    Heart failure Paternal Grandmother    Parkinsonism Neg Hx     Social History   Socioeconomic History   Marital status: Married    Spouse name: [name]   Number of children: 0   Years of education: college   Highest education level: Not on file  Occupational History  Occupation: disabled    Fish farm manager: Solicitor: UNEMPLOYED  Tobacco Use   Smoking status: Former    Packs/day: 2.00    Years: 20.00    Pack years: 40.00    Types: Cigarettes    Quit date: 10/09/2006    Years since quitting: 15.1   Smokeless tobacco: Never  Vaping Use   Vaping Use: Never used  Substance and Sexual Activity   Alcohol use: Yes    Alcohol/week: 1.0 standard drink    Types: 1 Cans of beer per week    Comment: rarely   Drug use: No   Sexual activity: Yes    Birth control/protection: Surgical  Other Topics Concern   Not on file  Social History Narrative   12/16/19   From: [place]   Living: with wife [name], since 1990   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      Family: mother and brothers are nearby      Enjoys: record collection with 10,000 LPs - working on digitizing these, fixing up a 1968 mustang       Exercise: not currently   Diet: over-eating, tries to eat somewhat healthy - salad every other day      Safety   Seat belts: Yes    Guns: Yes  and secure   Safe in relationships: Yes    Social Determinants of Radio broadcast assistant Strain: Not on file  Food Insecurity: No Food Insecurity   Worried About Charity fundraiser in the Last Year: Never true   [place] in the Last Year: Never true  Transportation Needs: No Transportation Needs   Lack of Transportation (Medical): No   Lack of Transportation (Non-Medical): No  Physical Activity:  Not on file  Stress: Not on file  Social Connections: Not on file  Intimate Partner Violence: Not on file    Review of Systems: See HPI, otherwise negative ROS  Physical Exam: BP (!) 133/91    Temp (!) 96.7 F (35.9 C) (Temporal)    Resp 17    Ht '6\' 2"'$  (1.88 m)    Wt 117.9 kg    SpO2 99%    BMI 33.38 kg/m  General:   Alert,  pleasant and cooperative in NAD Head:  Normocephalic and atraumatic. Neck:  Supple; no masses or thyromegaly. Lungs:  Clear throughout to auscultation, normal respiratory effort.    Heart:  +S1, +S2, Regular rate and rhythm, No edema. Abdomen:  Soft, nontender and nondistended. Normal bowel sounds, without guarding, and without rebound.   Neurologic:  Alert and  oriented x4;  grossly normal neurologically.  Impression/Plan: THEODIS KINSEL is here for an colonoscopy to be performed for Screening colonoscopy average risk   Risks, benefits, limitations, and alternatives regarding  colonoscopy have been reviewed with the patient.  Questions have been answered.  All parties agreeable.   Jonathon Bellows, MD  12/15/2021, 8:37 AM

## 2021-12-15 NOTE — Telephone Encounter (Signed)
Pt had a colonoscopy today and was  told he was not cleaned out enough he wanted to talk to someone about a stronger prep. Before the next one was sched. ?

## 2021-12-19 ENCOUNTER — Telehealth: Payer: Self-pay | Admitting: Gastroenterology

## 2021-12-19 ENCOUNTER — Other Ambulatory Visit: Payer: Self-pay

## 2021-12-19 ENCOUNTER — Telehealth: Payer: Self-pay

## 2021-12-19 DIAGNOSIS — R195 Other fecal abnormalities: Secondary | ICD-10-CM

## 2021-12-19 MED ORDER — PEG 3350-KCL-NA BICARB-NACL 420 G PO SOLR: 4000.0000 mL | Freq: Once | ORAL | 0 refills | Status: AC

## 2021-12-19 MED ORDER — PEG 3350-KCL-NABCB-NACL-NASULF 236 G PO SOLR: 4000.0000 mL | Freq: Once | ORAL | 0 refills | Status: AC

## 2021-12-19 NOTE — Addendum Note (Signed)
Addended by: Wayna Chalet on: 12/19/2021 04:50 PM ? ? Modules accepted: Orders ? ?

## 2021-12-19 NOTE — Telephone Encounter (Signed)
Called patient to reschedule colonoscopy per [facility]. Will need a 2-day prep instructions. ?

## 2021-12-19 NOTE — Telephone Encounter (Signed)
[facility] was inquiring for pt to use Golytely instead of Newlytely as prep ?

## 2021-12-19 NOTE — Telephone Encounter (Signed)
GoLytely prescription was sent to pharmacy. ?

## 2021-12-19 NOTE — Progress Notes (Signed)
Patient has been scheduled to due a repeat colonoscopy. Patient expressed he could not do the 2 day prep but could do the Dulcolax tablets starting at 2pm. Instructions will be sent via my chart and mail. ?

## 2021-12-20 ENCOUNTER — Ambulatory Visit (INDEPENDENT_AMBULATORY_CARE_PROVIDER_SITE_OTHER): Payer: Medicare HMO | Admitting: Licensed Clinical Social Worker

## 2021-12-20 DIAGNOSIS — F4323 Adjustment disorder with mixed anxiety and depressed mood: Secondary | ICD-10-CM

## 2021-12-20 DIAGNOSIS — F4311 Post-traumatic stress disorder, acute: Secondary | ICD-10-CM

## 2021-12-22 NOTE — Chronic Care Management (AMB) (Signed)
?Chronic Care Management  ? ? Clinical Social Work Note ? ?12/22/2021 ?Name: Corey Bell MRN: [id number] DOB: 1966/01/14 ? ?Corey Bell is a 56 y.o. year old male who is a primary care patient of Jearld Fenton, NP. The CCM team was consulted to assist the patient with chronic disease management and/or care coordination needs related to: Mental Health Counseling and Resources.  ? ?Engaged with patient by telephone for follow up visit in response to provider referral for social work chronic care management and care coordination services.  ? ?Consent to Services:  ?The patient was given information about Chronic Care Management services, agreed to services, and gave verbal consent prior to initiation of services.  Please see initial visit note for detailed documentation.  ? ?Patient agreed to services and consent obtained.  ? ?Assessment: Review of patient past medical history, allergies, medications, and health status, including review of relevant consultants reports was performed today as part of a comprehensive evaluation and provision of chronic care management and care coordination services.    ? ?SDOH (Social Determinants of Health) assessments and interventions performed:   ? ?Advanced Directives Status: Not addressed in this encounter. ? ?CCM Care Plan ? ?Allergies  ?Allergen Reactions  ? Augmentin [Amoxicillin-Pot Clavulanate] Diarrhea  ? Demerol [Meperidine] Other (See Comments)  ?  "goes crazy and has the strength of 10 men"  ? Eggs Or Egg-Derived Products Diarrhea and Nausea And Vomiting  ? Sulfamethoxazole Other (See Comments)  ?  Blisters form in mouth  ? Statins Rash  ? Sulfa Antibiotics Rash  ? Sulfacetamide Sodium Rash  ? ? ?Outpatient Encounter Medications as of 12/20/2021  ?Medication Sig  ? buPROPion (WELLBUTRIN XL) 300 MG 24 hr tablet Take 1 tablet (300 mg total) by mouth daily.  ? calcium carbonate (TUMS - DOSED IN MG ELEMENTAL CALCIUM) 500 MG chewable tablet Chew 3 tablets by mouth daily.   ? citalopram (CELEXA) 40 MG tablet Take 1 tablet (40 mg total) by mouth daily.  ? clonazePAM (KLONOPIN) 0.5 MG tablet Take 1 tablet (0.5 mg total) by mouth daily as needed for anxiety.  ? cyclobenzaprine (FLEXERIL) 10 MG tablet Take 1 tablet (10 mg total) by mouth at bedtime.  ? diphenhydrAMINE (BENADRYL) 25 mg capsule Take 50 mg by mouth at bedtime.  ? doxycycline (VIBRA-TABS) 100 MG tablet Take 1 tablet (100 mg total) by mouth 2 (two) times daily.  ? erythromycin with ethanol (THERAMYCIN) 2 % external solution APPLY TOPICALLY TWICE A DAY AS NEEDED  ? ezetimibe (ZETIA) 10 MG tablet TAKE 1 TABLET BY MOUTH DAILY  ? fexofenadine (ALLEGRA) 180 MG tablet Take 180 mg by mouth daily.  ? finasteride (PROPECIA) 1 MG tablet Take 0.25 mg by mouth daily.  ? fluticasone (CUTIVATE) 0.05 % cream   ? fluticasone (FLONASE) 50 MCG/ACT nasal spray Place 1 spray into both nostrils 2 (two) times daily.  ? gabapentin (NEURONTIN) 800 MG tablet TAKE 1 TABLET BY MOUTH 4 TIMES DAILY  ? hydrOXYzine (VISTARIL) 25 MG capsule TAKE 1 CAPSULE BY MOUTH EVERY 8 HOURS ASNEEDED  ? Ibuprofen 200 MG CAPS   ? levothyroxine (SYNTHROID) 75 MCG tablet TAKE 1 TABLET BY MOUTH DAILY  ? methocarbamol (ROBAXIN) 500 MG tablet Take 1 tablet (500 mg total) by mouth every 8 (eight) hours as needed for muscle spasms. TAKE 2 TABLETS BY MOUTH EVERY 6 HOURS ASNEEDED FOR MUSCLE SPASMS  ? montelukast (SINGULAIR) 10 MG tablet Take 1 tablet (10 mg total) by mouth at bedtime.  ?  pantoprazole (PROTONIX) 40 MG tablet Take 1 tablet (40 mg total) by mouth daily.  ? pramoxine (PROCTOFOAM) 1 % foam Place 1 application rectally 3 (three) times daily as needed for anal itching.  ? predniSONE (DELTASONE) 10 MG tablet Take 6 tabs on day 1, 5 tabs on day 2, 4 tabs on day 3, 3 tabs on day 4, 2 tabs on day 5, 1 tab on day 6  ? Saw Palmetto 450 MG CAPS Take 4 capsules by mouth daily.   ? sodium chloride (OCEAN) 0.65 % SOLN nasal spray Place 1 spray into both nostrils as needed for  congestion.  ? tamsulosin (FLOMAX) 0.4 MG CAPS capsule Take 2 capsules (0.8 mg total) by mouth at bedtime.  ? traMADol (ULTRAM) 50 MG tablet TAKE 1 TABLET BY MOUTH EVERY 8 HOURS AS NEEDED FOR MODERATE TO SEVERE PAIN  ? ?No facility-administered encounter medications on file as of 12/20/2021.  ? ? ?Patient Active Problem List  ? Diagnosis Date Noted  ? Chronic upper back pain 04/25/2021  ? Class 1 obesity due to excess calories with serious comorbidity and body mass index (BMI) of 33.0 to 33.9 in adult 04/25/2021  ? BPH (benign prostatic hyperplasia) 03/02/2020  ? Iron deficiency anemia 03/02/2020  ? Adjustment reaction with anxiety and depression 03/06/2019  ? Post-traumatic stress disorder, acute 01/27/2019  ? Diabetes mellitus type 2, diet-controlled ([place]) 07/31/2017  ? GERD (gastroesophageal reflux disease) 04/07/2015  ? HLD (hyperlipidemia) 06/03/2014  ? DDD (degenerative disc disease), cervical 10/13/2013  ? DDD (degenerative disc disease), lumbosacral 10/13/2013  ? OSA (obstructive sleep apnea) 07/01/2013  ? Essential hypertension 05/29/2013  ? Hypothyroidism   ? ? ?Conditions to be addressed/monitored: Anxiety, Depression, and PTSD ? ?Care Plan : General Social Work (Adult)  ?Updates made by Rebekah Chesterfield, LCSW since 12/22/2021 12:00 AM  ?  ? ?Problem: Coping Skills (General Plan of Care)   ?  ? ?Goal: Coping Skills Enhanced Completed 12/20/2021  ?Start Date: 05/24/2021  ?This Visit's Progress: On track  ?Recent Progress: On track  ?Priority: High  ?Note:   ?Current barriers:   ?Acute Mental Health needs related to anxiety and depression ?Limited social support, Mental Health Concerns , and Family and relationship dysfunction ?Needs Support, Education, and Care Coordination in order to meet unmet mental health needs. ?Clinical Goal(s): demonstrate a reduction in symptoms related to :Adjustment disorder, Anxiety , Depression, and Grief   ?Clinical Interventions:  ?1:1 collaboration with primary care provider  regarding development and update of comprehensive plan of care as evidenced by provider attestation and co-signature ?Inter-disciplinary care team collaboration (see longitudinal plan of care) ?Assessed patient's previous and current treatment, coping skills, support system and barriers to care  ?Patient reports difficulty managing stress triggered by mother's recent passing May 2022. Patient and spouse were very involved in her care prior to her passing 09/06:  Patient reports increase in anxiety and is interested in strengthening support to cope with symptoms. Patient is highly motivated to initiate counseling 09/20: Symptoms fluctuate depending on the day. No report of SI/HI Patient reports decrease in PRN medications, which is improvement in him managing symptoms 11/15: Patient continues to comply with med management 1/17: Reports decrease in symptoms and spouse is a form of support 3/14: Patient is managing symptoms well, stating they have improved ?Patient shared that he has not received multiple family heirlooms that mother awarded him, which has significantly strained relationship with stepbrothers 09/06: Patient reports that he has decreased communication with step-brothers, in  attempt, to better manage stress 09/20: Patient recently spoke with brothers; however, states that he continues to limit interactions ?Patient endorses racing thoughts, difficulty relaxing, and irritability. CCM LCSW provided a safe environment for patient to process feelings of anger  ?Patient works one day out of the week ?Patient reports emotional support from spouse and has hired an Forensic psychologist to strengthen support system regarding legal claims 09/20: Patient receives limited support from attorney to date; however, patient continues to follow up ?States interest in learning tools to cope with stressors and is open to referral to therapy. He prefers to speak with a male therapist and is open to telehealth and traveling to  [place], [place] 09/06: Patient was successful in identifying healthy coping skills (working on car) Patient verbalized consent for a referral to [facility]. CCM LCSW will collaborate with PCP regarding referral 09/20:

## 2021-12-22 NOTE — Patient Instructions (Signed)
Visit Information ? ?Thank you for taking time to visit with me today. Please don't hesitate to contact me if I can be of assistance to you before our next scheduled telephone appointment. ? ?Following are the goals we discussed today:  ?Patient Goals/Self-Care Activities: Over the next 120 days ?- participation in mental health treatment encouraged ?- self-awareness of emotional triggers encouraged ?- strategies to manage emotional triggers promoted ?- talk about feelings with a friend, family or spiritual advisor ?Contact clinic with any questions or concerns ?Attend scheduled appointments with providers ? ?Please call the care guide team at [phone] if you need to cancel or reschedule your appointment.  ? ?If you are experiencing a Mental Health or [place] or need someone to talk to, please call the Suicide and Crisis Lifeline: 988 ?call 911  ? ?Patient verbalizes understanding of instructions and care plan provided today and agrees to view in [place]. Active MyChart status confirmed with patient.   ? ?No further follow up required: Patient has met goals associated with CCM LCSW ? ?Christa See, MSW, LCSW ?[facility] Care Management ?[facility] ?[email or web address] ?Phone [phone] ?12:27 PM ? ?

## 2021-12-26 DIAGNOSIS — F320 Major depressive disorder, single episode, mild: Secondary | ICD-10-CM | POA: Diagnosis not present

## 2022-01-06 DIAGNOSIS — F32A Depression, unspecified: Secondary | ICD-10-CM

## 2022-01-08 ENCOUNTER — Encounter: Payer: Self-pay | Admitting: Internal Medicine

## 2022-01-08 DIAGNOSIS — R3911 Hesitancy of micturition: Secondary | ICD-10-CM

## 2022-01-09 MED ORDER — TAMSULOSIN HCL 0.4 MG PO CAPS: 0.8000 mg | ORAL_CAPSULE | Freq: Every day | ORAL | 1 refills | Status: DC

## 2022-01-12 ENCOUNTER — Ambulatory Visit: Payer: Medicare HMO | Admitting: Certified Registered Nurse Anesthetist

## 2022-01-12 ENCOUNTER — Encounter: Payer: Self-pay | Admitting: Gastroenterology

## 2022-01-12 ENCOUNTER — Ambulatory Visit
Admission: RE | Admit: 2022-01-12 | Discharge: 2022-01-12 | Disposition: A | Discharge status: Home / Self Care | Payer: Medicare HMO | Attending: Gastroenterology | Admitting: Gastroenterology

## 2022-01-12 ENCOUNTER — Encounter
Admission: RE | Disposition: A | Discharge status: Home / Self Care | Payer: Self-pay | Source: Home / Self Care | Attending: Gastroenterology

## 2022-01-12 DIAGNOSIS — Z1211 Encounter for screening for malignant neoplasm of colon: Secondary | ICD-10-CM | POA: Insufficient documentation

## 2022-01-12 DIAGNOSIS — Z79890 Hormone replacement therapy (postmenopausal): Secondary | ICD-10-CM | POA: Insufficient documentation

## 2022-01-12 DIAGNOSIS — E119 Type 2 diabetes mellitus without complications: Secondary | ICD-10-CM | POA: Insufficient documentation

## 2022-01-12 DIAGNOSIS — K219 Gastro-esophageal reflux disease without esophagitis: Secondary | ICD-10-CM | POA: Diagnosis not present

## 2022-01-12 DIAGNOSIS — G4730 Sleep apnea, unspecified: Secondary | ICD-10-CM | POA: Insufficient documentation

## 2022-01-12 DIAGNOSIS — Z87891 Personal history of nicotine dependence: Secondary | ICD-10-CM | POA: Insufficient documentation

## 2022-01-12 DIAGNOSIS — R195 Other fecal abnormalities: Secondary | ICD-10-CM | POA: Diagnosis not present

## 2022-01-12 DIAGNOSIS — E782 Mixed hyperlipidemia: Secondary | ICD-10-CM | POA: Diagnosis not present

## 2022-01-12 DIAGNOSIS — E039 Hypothyroidism, unspecified: Secondary | ICD-10-CM | POA: Insufficient documentation

## 2022-01-12 DIAGNOSIS — F32A Depression, unspecified: Secondary | ICD-10-CM | POA: Insufficient documentation

## 2022-01-12 DIAGNOSIS — J449 Chronic obstructive pulmonary disease, unspecified: Secondary | ICD-10-CM | POA: Diagnosis not present

## 2022-01-12 DIAGNOSIS — K635 Polyp of colon: Secondary | ICD-10-CM | POA: Diagnosis not present

## 2022-01-12 DIAGNOSIS — K640 First degree hemorrhoids: Secondary | ICD-10-CM | POA: Diagnosis not present

## 2022-01-12 DIAGNOSIS — D126 Benign neoplasm of colon, unspecified: Secondary | ICD-10-CM | POA: Diagnosis not present

## 2022-01-12 DIAGNOSIS — D120 Benign neoplasm of cecum: Secondary | ICD-10-CM | POA: Diagnosis not present

## 2022-01-12 DIAGNOSIS — F419 Anxiety disorder, unspecified: Secondary | ICD-10-CM | POA: Insufficient documentation

## 2022-01-12 HISTORY — PX: COLONOSCOPY WITH PROPOFOL: SHX5780

## 2022-01-12 LAB — GLUCOSE, CAPILLARY: Glucose-Capillary: 113 mg/dL — ABNORMAL HIGH (ref 70–99)

## 2022-01-12 SURGERY — COLONOSCOPY WITH PROPOFOL
Anesthesia: General

## 2022-01-12 MED ORDER — LIDOCAINE HCL (PF) 2 % IJ SOLN
INTRAMUSCULAR | Status: AC
  Filled 2022-01-12: qty 15

## 2022-01-12 MED ORDER — PROPOFOL 10 MG/ML IV BOLUS
INTRAVENOUS | Status: DC | PRN
  Administered 2022-01-12: 70 mg via INTRAVENOUS
  Administered 2022-01-12 (×2): 30 mg via INTRAVENOUS

## 2022-01-12 MED ORDER — PROPOFOL 500 MG/50ML IV EMUL
INTRAVENOUS | Status: AC
  Filled 2022-01-12: qty 50

## 2022-01-12 MED ORDER — LIDOCAINE HCL (CARDIAC) PF 100 MG/5ML IV SOSY
PREFILLED_SYRINGE | INTRAVENOUS | Status: DC | PRN
  Administered 2022-01-12: 50 mg via INTRAVENOUS

## 2022-01-12 MED ORDER — PROPOFOL 500 MG/50ML IV EMUL
INTRAVENOUS | Status: DC | PRN
  Administered 2022-01-12: 140 ug/kg/min via INTRAVENOUS

## 2022-01-12 MED ORDER — SODIUM CHLORIDE 0.9 % IV SOLN: INTRAVENOUS | Status: DC

## 2022-01-12 MED ORDER — PROPOFOL 500 MG/50ML IV EMUL
INTRAVENOUS | Status: AC
  Filled 2022-01-12: qty 100

## 2022-01-12 MED ORDER — GLYCOPYRROLATE 0.2 MG/ML IJ SOLN
INTRAMUSCULAR | Status: AC
  Filled 2022-01-12: qty 5

## 2022-01-12 MED ORDER — STERILE WATER FOR IRRIGATION IR SOLN
Status: DC | PRN
  Administered 2022-01-12: 60 mL

## 2022-01-12 MED ORDER — PHENYLEPHRINE 40 MCG/ML (10ML) SYRINGE FOR IV PUSH (FOR BLOOD PRESSURE SUPPORT)
PREFILLED_SYRINGE | INTRAVENOUS | Status: DC | PRN
  Administered 2022-01-12 (×2): 80 ug via INTRAVENOUS

## 2022-01-12 MED ORDER — EPHEDRINE SULFATE (PRESSORS) 50 MG/ML IJ SOLN
INTRAMUSCULAR | Status: DC | PRN
Start: 2022-01-12 — End: 2022-01-12
  Administered 2022-01-12: 10 mg via INTRAVENOUS
  Administered 2022-01-12: 5 mg via INTRAVENOUS
  Administered 2022-01-12: 10 mg via INTRAVENOUS

## 2022-01-12 NOTE — Transfer of Care (Signed)
Immediate Anesthesia Transfer of Care Note ? ?Patient: Corey Bell ? ?Procedure(s) Performed: COLONOSCOPY WITH PROPOFOL ? ?Patient Location: Endoscopy Unit ? ?Anesthesia Type:General ? ?Level of Consciousness: drowsy ? ?Airway & Oxygen Therapy: Patient Spontanous Breathing ? ?Post-op Assessment: Report given to RN and Post -op Vital signs reviewed and stable ? ?Post vital signs: Reviewed and stable ? ?Last Vitals:  ?Vitals Value Taken Time  ?BP 97/42   ?Temp    ?Pulse 99   ?Resp 17   ?SpO2 99   ? ? ?Last Pain:  ?Vitals:  ? 01/12/22 0721  ?TempSrc: Temporal  ?PainSc: 0-No pain  ?   ? ?  ? ?Complications: No notable events documented. ?

## 2022-01-12 NOTE — Anesthesia Preprocedure Evaluation (Addendum)
Anesthesia Evaluation  ?Patient identified by MRN, date of birth, ID band ?Patient awake ? ? ? ?Reviewed: ?Allergy & Precautions, H&P , NPO status , Patient's Chart, lab work & pertinent test results, reviewed documented beta blocker date and time  ? ?History of Anesthesia Complications ?Negative for: history of anesthetic complications ? ?Airway ?Mallampati: I ? ?TM Distance: >3 FB ?Neck ROM: full ? ? ? Dental ? ?(+) Dental Advidsory Given, Caps, Missing, Teeth Intact, Chipped ?  ?Pulmonary ?neg shortness of breath, sleep apnea and Continuous Positive Airway Pressure Ventilation , neg COPD, neg recent URI, former smoker,  ?  ?Pulmonary exam normal ?breath sounds clear to auscultation ? ? ? ? ? ? Cardiovascular ?Exercise Tolerance: Good ?hypertension, (-) angina(-) Past MI and (-) Cardiac Stents Normal cardiovascular exam(-) dysrhythmias (-) Valvular Problems/Murmurs ?Rhythm:regular Rate:Normal ? ? ?  ?Neuro/Psych ?PSYCHIATRIC DISORDERS Anxiety Depression negative neurological ROS ?   ? GI/Hepatic ?Neg liver ROS, GERD  Controlled,  ?Endo/Other  ?diabetes (borderline), Well Controlled, Type 2Hypothyroidism  ? Renal/GU ?negative Renal ROS  ?negative genitourinary ?  ?Musculoskeletal ? ? Abdominal ?  ?Peds ? Hematology ?negative hematology ROS ?(+)   ?Anesthesia Other Findings ?Past Medical History: ?No date: Anxiety ?10/04/2012: Aphthous ulcer ?    Comment:  improving ?No date: Arthritis ?    Comment:  left index fnger ?No date: Carpal tunnel syndrome, bilateral ?    Comment:  damaged nerve in neck ?No date: Chronic pain syndrome ?No date: COPD (chronic obstructive pulmonary disease) ([place]) ?    Comment:  mild ?No date: Depression ?No date: Diverticulitis of colon ?No date: Esophageal reflux ?No date: H/O acute prostatitis ?No date: Hypothyroidism ?No date: Insomnia, unspecified ?No date: Mixed hyperlipidemia ?12/20/2012: Peyronie disease ?    Comment:  PER uROLOGY ?No date: Seasonal  allergies ?No date: Sleep apnea ?    Comment:  uses cpap ?No date: Tympanic membrane perforation ?    Comment:  history of ? ? Reproductive/Obstetrics ?negative OB ROS ? ?  ? ? ? ? ? ? ? ? ? ? ? ? ? ?  ?  ? ? ? ? ? ? ? ?Anesthesia Physical ? ?Anesthesia Plan ? ?ASA: 3 ? ?Anesthesia Plan: General  ? ?Post-op Pain Management:   ? ?Induction: Intravenous ? ?PONV Risk Score and Plan: 2 and Propofol infusion and TIVA ? ?Airway Management Planned: Natural Airway and Nasal Cannula ? ?Additional Equipment:  ? ?Intra-op Plan:  ? ?Post-operative Plan:  ? ?Informed Consent: I have reviewed the patients History and Physical, chart, labs and discussed the procedure including the risks, benefits and alternatives for the proposed anesthesia with the patient or authorized representative who has indicated his/her understanding and acceptance.  ? ? ? ?Dental Advisory Given ? ?Plan Discussed with: Anesthesiologist, CRNA and Surgeon ? ?Anesthesia Plan Comments:   ? ? ? ? ? ? ?Anesthesia Quick Evaluation ? ?

## 2022-01-12 NOTE — Op Note (Signed)
[facility] ?Gastroenterology ?Patient Name: Corey Bell ?Procedure Date: 01/12/2022 8:14 AM ?MRN: [id number] ?Account #: [id number] ?Date of Birth: January 04, 1966 ?Admit Type: Outpatient ?Age: 56 ?Room: [facility] ENDO ROOM 3 ?Gender: Male ?Note Status: Finalized ?Instrument Name: Colonoscope [id number] ?Procedure:             Colonoscopy ?Indications:           Positive Cologuard test ?Providers:             Jonathon Bellows MD, MD ?Referring MD:          Jearld Fenton (Referring MD) ?Medicines:             Monitored Anesthesia Care ?Complications:         No immediate complications. ?Procedure:             Pre-Anesthesia Assessment: ?                       - Prior to the procedure, a History and Physical was  ?                       performed, and patient medications, allergies and  ?                       sensitivities were reviewed. The patient's tolerance  ?                       of previous anesthesia was reviewed. ?                       - The risks and benefits of the procedure and the  ?                       sedation options and risks were discussed with the  ?                       patient. All questions were answered and informed  ?                       consent was obtained. ?                       - ASA Grade Assessment: II - A patient with mild  ?                       systemic disease. ?                       After obtaining informed consent, the colonoscope was  ?                       passed under direct vision. Throughout the procedure,  ?                       the patient's blood pressure, pulse, and oxygen  ?                       saturations were monitored continuously. The  ?                       Colonoscope was introduced through the  anus and  ?                       advanced to the the cecum, identified by the  ?                       appendiceal orifice. The colonoscopy was performed  ?                       with ease. The patient tolerated the procedure well.  ?                        The quality of the bowel preparation was good. ?Findings: ?     The perianal and digital rectal examinations were normal. ?     A 3 mm polyp was found in the cecum. The polyp was sessile. The polyp  ?     was removed with a cold biopsy forceps. Resection and retrieval were  ?     complete. ?     Non-bleeding internal hemorrhoids were found during retroflexion. The  ?     hemorrhoids were medium-sized and Grade I (internal hemorrhoids that do  ?     not prolapse). ?     The exam was otherwise without abnormality on direct and retroflexion  ?     views. ?Impression:            - One 3 mm polyp in the cecum, removed with a cold  ?                       biopsy forceps. Resected and retrieved. ?                       - Non-bleeding internal hemorrhoids. ?                       - The examination was otherwise normal on direct and  ?                       retroflexion views. ?Recommendation:        - Discharge patient to home (with escort). ?                       - Resume previous diet. ?                       - Continue present medications. ?                       - Await pathology results. ?                       - Repeat colonoscopy for surveillance based on  ?                       pathology results. ?Procedure Code(s):     --- Professional --- ?                       [phone], Colonoscopy, flexible; with biopsy, single or  ?                       multiple ?  Diagnosis Code(s):     --- Professional --- ?                       K63.5, Polyp of colon ?                       K64.0, First degree hemorrhoids ?                       R19.5, Other fecal abnormalities ?CPT copyright 2019 [employer] Medical Association. All rights reserved. ?The codes documented in this report are preliminary and upon coder review may  ?be revised to meet current compliance requirements. ?Jonathon Bellows, MD ?Jonathon Bellows MD, MD ?01/12/2022 8:43:12 AM ?This report has been signed electronically. ?Number of Addenda: 0 ?Note Initiated On: 01/12/2022 8:14 AM ?Scope  Withdrawal Time: 0 hours 16 minutes 16 seconds  ?Total Procedure Duration: 0 hours 20 minutes 5 seconds  ?Estimated Blood Loss:  Estimated blood loss: none. ?     [facility] ?

## 2022-01-12 NOTE — H&P (Signed)
? ? ? ?Jonathon Bellows, MD ?[address], [place], [place], [place], [address] ?[address], [place], [place], [place], [address] ?Phone: [phone]  ?Fax: [phone] ? ?Primary Care Physician:  Jearld Fenton, NP ? ? ?Pre-Procedure History & Physical: ?HPI:  Corey Bell is a 56 y.o. male is here for an colonoscopy. ?  ?Past Medical History:  ?Diagnosis Date  ? Anxiety   ? Aphthous ulcer 10/04/2012  ? improving  ? Arthritis   ? left index fnger  ? Carpal tunnel syndrome, bilateral   ? damaged nerve in neck  ? Chronic pain syndrome   ? COPD (chronic obstructive pulmonary disease) ([place])   ? mild  ? Depression   ? Diverticulitis of colon   ? Esophageal reflux   ? H/O acute prostatitis   ? Hypothyroidism   ? Insomnia, unspecified   ? Mixed hyperlipidemia   ? Peyronie disease 12/20/2012  ? PER uROLOGY  ? Seasonal allergies   ? Sleep apnea   ? uses cpap  ? Tympanic membrane perforation   ? history of  ? ? ?Past Surgical History:  ?Procedure Laterality Date  ? COLONOSCOPY WITH PROPOFOL N/A 12/15/2021  ? Procedure: COLONOSCOPY WITH PROPOFOL;  Surgeon: Jonathon Bellows, MD;  Location: [facility] ENDOSCOPY;  Service: Gastroenterology;  Laterality: N/A;  ? LAPAROSCOPIC GASTRIC SLEEVE RESECTION N/A 07/31/2017  ? Procedure: LAPAROSCOPIC GASTRIC SLEEVE RESECTION WITH UPPER ENDOSCOPY;  Surgeon: Alphonsa Overall, MD;  Location: [facility];  Service: General;  Laterality: N/A;  ? LumbarSacral Disc Surgery    ? L5-S1  ? MENISCUS REPAIR Left   ? torn meniscus  ? NESBIT PROCEDURE N/A 08/22/2013  ? Procedure: 16 DOT PLACTATION;  Surgeon: Claybon Jabs, MD;  Location: [facility];  Service: Urology;  Laterality: N/A;  ? TYMPANOSTOMY [place]  ? ULNAR COLLATERAL LIGAMENT REPAIR Right 04/17/2019  ? Procedure: RIGHT THUMB ULNAR COLLATERAL LIGAMENT REPAIR;  Surgeon: Leanora Cover, MD;  Location: [facility];  Service: Orthopedics;  Laterality: Right;  ? ULNAR COLLATERAL LIGAMENT REPAIR Right 10/16/2019  ? Procedure:  RIGHT THUMB ULNAR COLLATERAL LIGAMENT RECONSTRUCTION;  Surgeon: Leanora Cover, MD;  Location: [place];  Service: Orthopedics;  Laterality: Right;  block in preop  ? VASECTOMY Bilateral 08/22/2013  ? Procedure: VASECTOMY;  Surgeon: Claybon Jabs, MD;  Location: [facility];  Service: Urology;  Laterality: Bilateral;  ? ? ?Prior to Admission medications   ?Medication Sig Start Date End Date Taking? Authorizing Provider  ?buPROPion (WELLBUTRIN XL) 300 MG 24 hr tablet Take 1 tablet (300 mg total) by mouth daily. 11/01/21  Yes Jearld Fenton, NP  ?calcium carbonate (TUMS - DOSED IN MG ELEMENTAL CALCIUM) 500 MG chewable tablet Chew 3 tablets by mouth daily.   Yes [provider]  ?clonazePAM (KLONOPIN) 0.5 MG tablet Take 1 tablet (0.5 mg total) by mouth daily as needed for anxiety. 07/21/21  Yes Jearld Fenton, NP  ?cyclobenzaprine (FLEXERIL) 10 MG tablet Take 1 tablet (10 mg total) by mouth at bedtime. 07/18/21  Yes Jearld Fenton, NP  ?doxycycline (VIBRA-TABS) 100 MG tablet Take 1 tablet (100 mg total) by mouth 2 (two) times daily. 11/17/21  Yes Jearld Fenton, NP  ?fexofenadine (ALLEGRA) 180 MG tablet Take 180 mg by mouth daily.   Yes [provider]  ?finasteride (PROPECIA) 1 MG tablet Take 0.25 mg by mouth daily.   Yes [provider]  ?fluticasone (FLONASE) 50 MCG/ACT nasal spray  Place 1 spray into both nostrils 2 (two) times daily. 11/02/20  Yes Jearld Fenton, NP  ?gabapentin (NEURONTIN) 800 MG tablet TAKE 1 TABLET BY MOUTH 4 TIMES DAILY 09/29/21  Yes Jearld Fenton, NP  ?hydrOXYzine (VISTARIL) 25 MG capsule TAKE 1 CAPSULE BY MOUTH EVERY 8 HOURS ASNEEDED 11/14/21  Yes Jearld Fenton, NP  ?pantoprazole (PROTONIX) 40 MG tablet Take 1 tablet (40 mg total) by mouth daily. 11/01/21  Yes Jearld Fenton, NP  ?sodium chloride (OCEAN) 0.65 % SOLN nasal spray Place 1 spray into both nostrils as needed for congestion. 06/17/20  Yes Montine Circle, PA-C  ?traMADol  (ULTRAM) 50 MG tablet TAKE 1 TABLET BY MOUTH EVERY 8 HOURS AS NEEDED FOR MODERATE TO SEVERE PAIN 11/14/21  Yes Jearld Fenton, NP  ?citalopram (CELEXA) 40 MG tablet Take 1 tablet (40 mg total) by mouth daily. 11/01/21   Jearld Fenton, NP  ?diphenhydrAMINE (BENADRYL) 25 mg capsule Take 50 mg by mouth at bedtime.    [provider]  ?erythromycin with ethanol (THERAMYCIN) 2 % external solution APPLY TOPICALLY TWICE A DAY AS NEEDED 04/25/21   Jearld Fenton, NP  ?ezetimibe (ZETIA) 10 MG tablet TAKE 1 TABLET BY MOUTH DAILY 10/27/21   Jearld Fenton, NP  ?fluticasone (CUTIVATE) 0.05 % cream  05/27/21   [provider]  ?Ibuprofen 200 MG CAPS     [provider]  ?levothyroxine (SYNTHROID) 75 MCG tablet TAKE 1 TABLET BY MOUTH DAILY 06/04/21   Jearld Fenton, NP  ?methocarbamol (ROBAXIN) 500 MG tablet Take 1 tablet (500 mg total) by mouth every 8 (eight) hours as needed for muscle spasms. TAKE 2 TABLETS BY MOUTH EVERY 6 HOURS ASNEEDED FOR MUSCLE SPASMS 11/30/21   Jearld Fenton, NP  ?montelukast (SINGULAIR) 10 MG tablet Take 1 tablet (10 mg total) by mouth at bedtime. 11/01/21   Jearld Fenton, NP  ?pramoxine (PROCTOFOAM) 1 % foam Place 1 application rectally 3 (three) times daily as needed for anal itching. ?Patient not taking: Reported on 01/12/2022 08/25/21   Jearld Fenton, NP  ?predniSONE (DELTASONE) 10 MG tablet Take 6 tabs on day 1, 5 tabs on day 2, 4 tabs on day 3, 3 tabs on day 4, 2 tabs on day 5, 1 tab on day 6 ?Patient not taking: Reported on 01/12/2022 11/01/21   Jearld Fenton, NP  ?Saw Palmetto 450 MG CAPS Take 4 capsules by mouth daily.  ?Patient not taking: Reported on 01/12/2022    [provider]  ?tamsulosin (FLOMAX) 0.4 MG CAPS capsule Take 2 capsules (0.8 mg total) by mouth at bedtime. 01/09/22   Jearld Fenton, NP  ? ? ?Allergies as of 12/19/2021 - Review Complete 12/15/2021  ?Allergen Reaction Noted  ? Augmentin [amoxicillin-pot clavulanate] Diarrhea 11/24/2013  ? Demerol  [meperidine] Other (See Comments) 07/01/2013  ? Eggs or egg-derived products Diarrhea and Nausea And Vomiting   ? Sulfamethoxazole Other (See Comments) 09/23/2015  ? Statins Rash   ? Sulfa antibiotics Rash   ? Sulfacetamide sodium Rash 12/10/2014  ? ? ?Family History  ?Problem Relation Age of Onset  ? Congestive Heart Failure Father   ? Heart disease Father   ? COPD Father   ? Kidney cancer Paternal Grandfather   ? Heart disease Brother   ?     stents at 9 yo  ? Bladder Cancer Paternal Uncle   ? Lupus Brother   ? Heart failure Paternal Grandmother   ? Parkinsonism  Neg Hx   ? ? ?Social History  ? ?Socioeconomic History  ? Marital status: Married  ?  Spouse name: Corey Bell  ? Number of children: 0  ? Years of education: college  ? Highest education level: Not on file  ?Occupational History  ? Occupation: disabled  ?  Employer: [employer]  ?  Employer: UNEMPLOYED  ?Tobacco Use  ? Smoking status: Former  ?  Packs/day: 2.00  ?  Years: 20.00  ?  Pack years: 40.00  ?  Types: Cigarettes  ?  Quit date: 10/09/2006  ?  Years since quitting: 15.2  ? Smokeless tobacco: Never  ?Vaping Use  ? Vaping Use: Never used  ?Substance and Sexual Activity  ? Alcohol use: Yes  ?  Alcohol/week: 1.0 standard drink  ?  Types: 1 Cans of beer per week  ?  Comment: rarely  ? Drug use: No  ? Sexual activity: Yes  ?  Birth control/protection: Surgical  ?Other Topics Concern  ? Not on file  ?Social History Narrative  ? 12/16/19  ? From: [place]  ? Living: with wife Corey Bell, since 1990  ? Work: [employer] one day a week  ?   ? Family: mother and brothers are nearby  ?   ? 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   ? Exercise: not currently  ? Diet: over-eating, tries to eat somewhat healthy - salad every other day  ?   ? Safety  ? Seat belts: Yes   ? Guns: Yes  and secure  ? Safe in relationships: Yes   ? ?Social Determinants of Health  ? ?Financial Resource Strain: Not on file  ?Food  Insecurity: No Food Insecurity  ? Worried About Charity fundraiser in the Last Year: Never true  ? Ran Out of Food in the Last Year: Never true  ?Transportation Needs: No Transportation Needs  ? [place]

## 2022-01-13 ENCOUNTER — Other Ambulatory Visit: Payer: Self-pay | Admitting: Internal Medicine

## 2022-01-13 ENCOUNTER — Encounter: Payer: Self-pay | Admitting: Gastroenterology

## 2022-01-13 LAB — SURGICAL PATHOLOGY

## 2022-01-13 NOTE — Telephone Encounter (Signed)
Requested medication (s) are due for refill today: yes ? ?Requested medication (s) are on the active medication list: yes ? ?Last refill:  11/30/21 #90/0 ? ?Future visit scheduled: yes ? ?Notes to clinic:  rx not delegated but also got 2 different directions. Please advise ? ? ?  ?Requested Prescriptions  ?Pending Prescriptions Disp Refills  ? methocarbamol (ROBAXIN) 500 MG tablet [Pharmacy Med Name: METHOCARBAMOL 500 MG TAB] 90 tablet 0  ?  Sig: TAKE 1 TABLET BY MOUTH EVERY 8 HOURS AS NEEDED FOR MUSCLE SPASMS  ?  ? Not Delegated - Analgesics:  Muscle Relaxants Failed - 01/13/2022  9:40 AM  ?  ?  Failed - This refill cannot be delegated  ?  ?  Passed - Valid encounter within last 6 months  ?  Recent Outpatient Visits   ? ?      ? 2 months ago Screen for colon cancer  ? [facility] [place], [place] W, NP  ? 3 months ago Rectal mass  ? [facility] [place], [place] W, NP  ? 4 months ago Family history of brain aneurysm  ? [facility] [place], [place] W, NP  ? 8 months ago DDD (degenerative disc disease), cervical  ? [place], NP  ? 10 months ago Other fatigue  ? [facility] [place], Coralie Keens, NP  ? ?  ?  ?Future Appointments   ? ?        ? In 3 months Baity, Coralie Keens, NP [facility], [place]  ? ?  ? ?  ?  ?  ? ?

## 2022-01-14 NOTE — Anesthesia Postprocedure Evaluation (Signed)
Anesthesia Post Note ? ?Patient: Corey Bell ? ?Procedure(s) Performed: COLONOSCOPY WITH PROPOFOL ? ?Patient location during evaluation: Endoscopy ?Anesthesia Type: General ?Level of consciousness: awake and alert ?Pain management: pain level controlled ?Vital Signs Assessment: post-procedure vital signs reviewed and stable ?Respiratory status: spontaneous breathing, nonlabored ventilation and respiratory function stable ?Cardiovascular status: blood pressure returned to baseline and stable ?Postop Assessment: no apparent nausea or vomiting ?Anesthetic complications: no ? ? ?No notable events documented. ? ? ?Last Vitals:  ?Vitals:  ? 01/12/22 0900 01/12/22 0910  ?BP: 122/71 123/76  ?Pulse: 60 (!) 56  ?Resp: 12 13  ?Temp:    ?SpO2: 100% 99%  ?  ?Last Pain:  ?Vitals:  ? 01/13/22 0744  ?TempSrc:   ?PainSc: 0-No pain  ? ? ?  ?  ?  ?  ?  ?  ? ?[place] Ouch ? ? ? ? ?

## 2022-01-16 ENCOUNTER — Other Ambulatory Visit: Payer: Self-pay | Admitting: Internal Medicine

## 2022-01-16 ENCOUNTER — Encounter: Payer: Self-pay | Admitting: Gastroenterology

## 2022-01-16 DIAGNOSIS — F320 Major depressive disorder, single episode, mild: Secondary | ICD-10-CM | POA: Diagnosis not present

## 2022-01-17 NOTE — Telephone Encounter (Signed)
Requested medication (s) are due for refill today: yes ? ?Requested medication (s) are on the active medication list: yes ? ?Last refill:  12/06/21 ? ?Future visit scheduled: yes ? ?Notes to clinic:  Unable to refill per protocol, cannot delegate. ? ? ? ?  ?Requested Prescriptions  ?Pending Prescriptions Disp Refills  ? methocarbamol (ROBAXIN) 500 MG tablet [Pharmacy Med Name: METHOCARBAMOL 500 MG TAB] 90 tablet 0  ?  Sig: TAKE 1 TABLET BY MOUTH EVERY 8 HOURS AS NEEDED FOR MUSCLE SPASMS  ?  ? Not Delegated - Analgesics:  Muscle Relaxants Failed - 01/16/2022 10:22 AM  ?  ?  Failed - This refill cannot be delegated  ?  ?  Passed - Valid encounter within last 6 months  ?  Recent Outpatient Visits   ? ?      ? 2 months ago Screen for colon cancer  ? [facility] [place], [place] W, NP  ? 3 months ago Rectal mass  ? [facility] [place], [place] W, NP  ? 4 months ago Family history of brain aneurysm  ? [facility] [place], [place] W, NP  ? 8 months ago DDD (degenerative disc disease), cervical  ? [place], NP  ? 10 months ago Other fatigue  ? [facility] [place], Coralie Keens, NP  ? ?  ?  ?Future Appointments   ? ?        ? In 3 months Baity, Coralie Keens, NP [facility], [place]  ? ?  ? ?  ?  ?  ? ? ?

## 2022-01-30 ENCOUNTER — Encounter: Payer: Self-pay | Admitting: Internal Medicine

## 2022-02-02 ENCOUNTER — Encounter: Payer: Self-pay | Admitting: Internal Medicine

## 2022-02-06 DIAGNOSIS — F320 Major depressive disorder, single episode, mild: Secondary | ICD-10-CM | POA: Diagnosis not present

## 2022-02-14 ENCOUNTER — Ambulatory Visit: Payer: Medicare HMO | Admitting: Podiatry

## 2022-02-14 DIAGNOSIS — L600 Ingrowing nail: Secondary | ICD-10-CM | POA: Diagnosis not present

## 2022-02-15 ENCOUNTER — Encounter: Payer: Self-pay | Admitting: Podiatry

## 2022-02-15 NOTE — Progress Notes (Signed)
?Subjective:  ?Patient ID: Corey Bell, male    DOB: 1966/07/08,  MRN: [id number] ? ?Chief Complaint  ?Patient presents with  ? Ingrown Toenail  ? ? ?56 y.o. male presents with the above complaint.  Patient presents with complaint left medial border ingrown.  Patient states the right side is doing much better.  Now the left side is hurting.  He states been on for quite some time is progressive gotten worse.  He denies any other acute complaint hurts with ambulation. ? ? ?Review of Systems: Negative except as noted in the HPI. Denies N/V/F/Ch. ? ?Past Medical History:  ?Diagnosis Date  ? Anxiety   ? Aphthous ulcer 10/04/2012  ? improving  ? Arthritis   ? left index fnger  ? Carpal tunnel syndrome, bilateral   ? damaged nerve in neck  ? Chronic pain syndrome   ? COPD (chronic obstructive pulmonary disease) ([place])   ? mild  ? Depression   ? Diverticulitis of colon   ? Esophageal reflux   ? H/O acute prostatitis   ? Hypothyroidism   ? Insomnia, unspecified   ? Mixed hyperlipidemia   ? Peyronie disease 12/20/2012  ? PER uROLOGY  ? Seasonal allergies   ? Sleep apnea   ? uses cpap  ? Tympanic membrane perforation   ? history of  ? ? ?Current Outpatient Medications:  ?  buPROPion (WELLBUTRIN XL) 300 MG 24 hr tablet, Take 1 tablet (300 mg total) by mouth daily., Disp: 90 tablet, Rfl: 1 ?  calcium carbonate (TUMS - DOSED IN MG ELEMENTAL CALCIUM) 500 MG chewable tablet, Chew 3 tablets by mouth daily., Disp: , Rfl:  ?  citalopram (CELEXA) 40 MG tablet, Take 1 tablet (40 mg total) by mouth daily., Disp: 90 tablet, Rfl: 1 ?  clonazePAM (KLONOPIN) 0.5 MG tablet, Take 1 tablet (0.5 mg total) by mouth daily as needed for anxiety., Disp: 20 tablet, Rfl: 0 ?  cyclobenzaprine (FLEXERIL) 10 MG tablet, Take 1 tablet (10 mg total) by mouth at bedtime., Disp: 30 tablet, Rfl: 5 ?  diphenhydrAMINE (BENADRYL) 25 mg capsule, Take 50 mg by mouth at bedtime., Disp: , Rfl:  ?  doxycycline (VIBRA-TABS) 100 MG tablet, Take 1 tablet (100 mg  total) by mouth 2 (two) times daily., Disp: 20 tablet, Rfl: 0 ?  erythromycin with ethanol (THERAMYCIN) 2 % external solution, APPLY TOPICALLY TWICE A DAY AS NEEDED, Disp: 60 mL, Rfl: 0 ?  ezetimibe (ZETIA) 10 MG tablet, TAKE 1 TABLET BY MOUTH DAILY, Disp: 90 tablet, Rfl: 3 ?  fexofenadine (ALLEGRA) 180 MG tablet, Take 180 mg by mouth daily., Disp: , Rfl:  ?  finasteride (PROPECIA) 1 MG tablet, Take 0.25 mg by mouth daily., Disp: , Rfl:  ?  fluticasone (CUTIVATE) 0.05 % cream, , Disp: , Rfl:  ?  fluticasone (FLONASE) 50 MCG/ACT nasal spray, Place 1 spray into both nostrils 2 (two) times daily., Disp: 16 g, Rfl: 2 ?  gabapentin (NEURONTIN) 800 MG tablet, TAKE 1 TABLET BY MOUTH 4 TIMES DAILY, Disp: 360 tablet, Rfl: 0 ?  hydrOXYzine (VISTARIL) 25 MG capsule, TAKE 1 CAPSULE BY MOUTH EVERY 8 HOURS ASNEEDED, Disp: 90 capsule, Rfl: 0 ?  Ibuprofen 200 MG CAPS, , Disp: , Rfl:  ?  levothyroxine (SYNTHROID) 75 MCG tablet, TAKE 1 TABLET BY MOUTH DAILY, Disp: 90 tablet, Rfl: 2 ?  methocarbamol (ROBAXIN) 500 MG tablet, TAKE 1 TABLET BY MOUTH EVERY 8 HOURS AS NEEDED FOR MUSCLE SPASMS, Disp: 90 tablet, Rfl: 0 ?  montelukast (SINGULAIR) 10 MG tablet, Take 1 tablet (10 mg total) by mouth at bedtime., Disp: 90 tablet, Rfl: 1 ?  pantoprazole (PROTONIX) 40 MG tablet, Take 1 tablet (40 mg total) by mouth daily., Disp: 90 tablet, Rfl: 1 ?  pramoxine (PROCTOFOAM) 1 % foam, Place 1 application rectally 3 (three) times daily as needed for anal itching. (Patient not taking: Reported on 01/12/2022), Disp: 15 g, Rfl: 0 ?  predniSONE (DELTASONE) 10 MG tablet, Take 6 tabs on day 1, 5 tabs on day 2, 4 tabs on day 3, 3 tabs on day 4, 2 tabs on day 5, 1 tab on day 6 (Patient not taking: Reported on 01/12/2022), Disp: 21 tablet, Rfl: 0 ?  Saw Palmetto 450 MG CAPS, Take 4 capsules by mouth daily.  (Patient not taking: Reported on 01/12/2022), Disp: , Rfl:  ?  sodium chloride (OCEAN) 0.65 % SOLN nasal spray, Place 1 spray into both nostrils as needed for  congestion., Disp: 15 mL, Rfl: 0 ?  tamsulosin (FLOMAX) 0.4 MG CAPS capsule, Take 2 capsules (0.8 mg total) by mouth at bedtime., Disp: 180 capsule, Rfl: 1 ?  traMADol (ULTRAM) 50 MG tablet, TAKE 1 TABLET BY MOUTH EVERY 8 HOURS AS NEEDED FOR MODERATE TO SEVERE PAIN, Disp: 30 tablet, Rfl: 0 ? ?Social History  ? ?Tobacco Use  ?Smoking Status Former  ? Packs/day: 2.00  ? Years: 20.00  ? Pack years: 40.00  ? Types: Cigarettes  ? Quit date: 10/09/2006  ? Years since quitting: 15.3  ?Smokeless Tobacco Never  ? ? ?Allergies  ?Allergen Reactions  ? Augmentin [Amoxicillin-Pot Clavulanate] Diarrhea  ? Demerol [Meperidine] Other (See Comments)  ?  "goes crazy and has the strength of 10 men"  ? Eggs Or Egg-Derived Products Diarrhea and Nausea And Vomiting  ? Sulfamethoxazole Other (See Comments)  ?  Blisters form in mouth  ? Statins Rash  ? Sulfa Antibiotics Rash  ? Sulfacetamide Sodium Rash  ? ?Objective:  ?There were no vitals filed for this visit. ?There is no height or weight on file to calculate BMI. ?Constitutional Well developed. ?Well nourished.  ?Vascular Dorsalis pedis pulses palpable bilaterally. ?Posterior tibial pulses palpable bilaterally. ?Capillary refill normal to all digits.  ?No cyanosis or clubbing noted. ?Pedal hair growth normal.  ?Neurologic Normal speech. ?Oriented to person, place, and time. ?Epicritic sensation to light touch grossly present bilaterally.  ?Dermatologic Painful ingrowing nail at medial nail borders of the hallux nail left. ?No other open wounds. ?No skin lesions.  ?Orthopedic: Normal joint ROM without pain or crepitus bilaterally. ?No visible deformities. ?No bony tenderness.  ? ?Radiographs: None ?Assessment:  ? ?1. Ingrown left big toenail   ? ?Plan:  ?Patient was evaluated and treated and all questions answered. ? ?Ingrown Nail, left ?-Patient elects to proceed with minor surgery to remove ingrown toenail removal today. Consent reviewed and signed by patient. ?-Ingrown nail excised.  See procedure note. ?-Educated on post-procedure care including soaking. Written instructions provided and reviewed. ?-Patient to follow up in 2 weeks for nail check. ? ?Procedure: Excision of Ingrown Toenail ?Location: Left 1st toe medial nail borders. ?Anesthesia: Lidocaine 1% plain; 1.5 mL and Marcaine 0.5% plain; 1.5 mL, digital block. ?Skin Prep: Betadine. ?Dressing: Silvadene; telfa; dry, sterile, compression dressing. ?Technique: Following skin prep, the toe was exsanguinated and a tourniquet was secured at the base of the toe. The affected nail border was freed, split with a nail splitter, and excised. Chemical matrixectomy was then performed with phenol and irrigated  out with alcohol. The tourniquet was then removed and sterile dressing applied. ?Disposition: Patient tolerated procedure well. Patient to return in 2 weeks for follow-up.  ? ?No follow-ups on file. ?

## 2022-02-22 ENCOUNTER — Other Ambulatory Visit: Payer: Self-pay | Admitting: Internal Medicine

## 2022-02-22 NOTE — Telephone Encounter (Signed)
Requested Prescriptions  ?Pending Prescriptions Disp Refills  ?? gabapentin (NEURONTIN) 800 MG tablet [Pharmacy Med Name: GABAPENTIN 800 MG TAB] 360 tablet 0  ?  Sig: TAKE 1 TABLET BY MOUTH 4 TIMES DAILY  ?  ? Neurology: Anticonvulsants - gabapentin Passed - 02/22/2022 12:20 PM  ?  ?  Passed - Cr in normal range and within 360 days  ?  Creat  ?Date Value Ref Range Status  ?09/26/2021 1.23 0.70 - 1.30 mg/dL Final  ? ?Creatinine,U  ?Date Value Ref Range Status  ?09/07/2014 133.2 mg/dL Final  ?   ?  ?  Passed - Completed PHQ-2 or PHQ-9 in the last 360 days  ?  ?  Passed - Valid encounter within last 12 months  ?  Recent Outpatient Visits   ?      ? 3 months ago Screen for colon cancer  ? [facility] [place], [place] W, NP  ? 4 months ago Rectal mass  ? [facility] [place], [place] W, NP  ? 6 months ago Family history of brain aneurysm  ? [facility] [place], Coralie Keens, NP  ? 10 months ago DDD (degenerative disc disease), cervical  ? [facility] [place], Coralie Keens, NP  ? 1 year ago Other fatigue  ? [facility] [place], Coralie Keens, NP  ?  ?  ?Future Appointments   ?        ? In 2 months Baity, Coralie Keens, NP [facility], [place]  ?  ? ?  ?  ?  ? ?

## 2022-02-23 ENCOUNTER — Ambulatory Visit: Payer: Medicare HMO | Admitting: Sports Medicine

## 2022-02-23 ENCOUNTER — Encounter: Payer: Self-pay | Admitting: Podiatry

## 2022-02-23 ENCOUNTER — Encounter (HOSPITAL_COMMUNITY): Payer: Self-pay | Admitting: *Deleted

## 2022-02-23 ENCOUNTER — Ambulatory Visit: Payer: Medicare HMO | Admitting: Podiatry

## 2022-02-23 DIAGNOSIS — M7751 Other enthesopathy of right foot: Secondary | ICD-10-CM

## 2022-02-23 MED ORDER — TRIAMCINOLONE ACETONIDE 40 MG/ML IJ SUSP
20.0000 mg | Freq: Once | INTRAMUSCULAR | Status: AC
  Administered 2022-02-23: 20 mg

## 2022-02-23 NOTE — Progress Notes (Signed)
He presents with his wife today for follow-up of his fifth metatarsal base of his right foot.  He saw Dr. Patel and Dr. Patel wanted to give him an injection to his fifth metatarsal base area last week but he did not want to take it.  Objective: Vital signs are stable he is alert and oriented x3.  He has palpable bursitis beneath and just proximal to the fifth metatarsal base which is warm and fluctuant.  This does not appear to be cellulitic and does not appear to be infectious.  Assessment: Bursitis of fifth met base right.  Plan: I injected the area with Kenalog and local anesthetic today making sure not to inject into the peroneus brevis arch of the peroneus longus tendons.  He will follow-up with Dr. Patel with any other questions or concerns. 

## 2022-02-27 ENCOUNTER — Other Ambulatory Visit: Payer: Self-pay | Admitting: Internal Medicine

## 2022-02-28 ENCOUNTER — Encounter: Payer: Self-pay | Admitting: Internal Medicine

## 2022-02-28 NOTE — Telephone Encounter (Signed)
Requested Prescriptions  Pending Prescriptions Disp Refills  . levothyroxine (SYNTHROID) 75 MCG tablet [Pharmacy Med Name: LEVOTHYROXINE SODIUM 75 MCG TAB] 90 tablet 2    Sig: TAKE 1 TABLET BY MOUTH DAILY     Endocrinology:  Hypothyroid Agents Passed - 02/27/2022  1:01 PM      Passed - TSH in normal range and within 360 days    TSH  Date Value Ref Range Status  09/26/2021 2.98 0.40 - 4.50 mIU/L Final         Passed - Valid encounter within last 12 months    Recent Outpatient Visits          3 months ago Screen for colon cancer   [facility] [place], Coralie Keens, NP   5 months ago Rectal mass   [facility] [place], Coralie Keens, NP   6 months ago Family history of brain aneurysm   [facility] [place], Coralie Keens, NP   10 months ago DDD (degenerative disc disease), cervical   [place], Coralie Keens, NP   1 year ago Other fatigue   [facility], Coralie Keens, NP      Future Appointments            In 2 months Baity, Coralie Keens, NP [facility], [place]           . methocarbamol (ROBAXIN) 500 MG tablet [Pharmacy Med Name: METHOCARBAMOL 500 MG TAB] 90 tablet 0    Sig: TAKE 1 TABLET BY MOUTH EVERY 8 HOURS AS NEEDED FOR MUSCLE SPASMS     Not Delegated - Analgesics:  Muscle Relaxants Failed - 02/27/2022  1:01 PM      Failed - This refill cannot be delegated      Passed - Valid encounter within last 6 months    Recent Outpatient Visits          3 months ago Screen for colon cancer   [facility] [place], Coralie Keens, NP   5 months ago Rectal mass   [facility] [place], Coralie Keens, NP   6 months ago Family history of brain aneurysm   [place], Coralie Keens, NP   10 months ago DDD (degenerative disc disease), cervical   [facility] [place], Coralie Keens, NP   1 year ago Other fatigue   [facility] [place], Coralie Keens, NP       Future Appointments            In 2 months Baity, Coralie Keens, NP [facility]

## 2022-02-28 NOTE — Telephone Encounter (Signed)
Requested medication (s) are due for refill today: yes  Requested medication (s) are on the active medication list: yes  Last refill:  01/16/22 #90/0  Future visit scheduled: yes  Notes to clinic:  Unable to refill per protocol, cannot delegate.    Requested Prescriptions  Pending Prescriptions Disp Refills   methocarbamol (ROBAXIN) 500 MG tablet [Pharmacy Med Name: METHOCARBAMOL 500 MG TAB] 90 tablet 0    Sig: TAKE 1 TABLET BY MOUTH EVERY 8 HOURS AS NEEDED FOR MUSCLE SPASMS     Not Delegated - Analgesics:  Muscle Relaxants Failed - 02/27/2022  1:01 PM      Failed - This refill cannot be delegated      Passed - Valid encounter within last 6 months    Recent Outpatient Visits           3 months ago Screen for colon cancer   [facility] [place], Coralie Keens, NP   5 months ago Rectal mass   [facility] [place], Coralie Keens, NP   6 months ago Family history of brain aneurysm   [facility] [place], Coralie Keens, NP   10 months ago DDD (degenerative disc disease), cervical   [facility], Coralie Keens, NP   1 year ago Other fatigue   [facility] [place], Coralie Keens, NP       Future Appointments             In 2 months Baity, Coralie Keens, NP [facility], [facility]             Signed Prescriptions Disp Refills   levothyroxine (SYNTHROID) 75 MCG tablet 90 tablet 0    Sig: TAKE 1 TABLET BY MOUTH DAILY     Endocrinology:  Hypothyroid Agents Passed - 02/27/2022  1:01 PM      Passed - TSH in normal range and within 360 days    TSH  Date Value Ref Range Status  09/26/2021 2.98 0.40 - 4.50 mIU/L Final         Passed - Valid encounter within last 12 months    Recent Outpatient Visits           3 months ago Screen for colon cancer   [facility], Coralie Keens, NP   5 months ago Rectal mass   [facility] [place], Coralie Keens, NP   6 months ago Family history of brain aneurysm    [place], NP   10 months ago DDD (degenerative disc disease), cervical   [facility] [place], Coralie Keens, NP   1 year ago Other fatigue   [facility] [place], Coralie Keens, NP       Future Appointments             In 2 months Baity, Coralie Keens, NP [facility], [facility]

## 2022-03-02 ENCOUNTER — Ambulatory Visit: Payer: PPO | Admitting: Podiatry

## 2022-03-23 DIAGNOSIS — M5451 Vertebrogenic low back pain: Secondary | ICD-10-CM | POA: Diagnosis not present

## 2022-03-23 DIAGNOSIS — F320 Major depressive disorder, single episode, mild: Secondary | ICD-10-CM | POA: Diagnosis not present

## 2022-03-30 ENCOUNTER — Encounter: Payer: Self-pay | Admitting: Internal Medicine

## 2022-03-31 ENCOUNTER — Ambulatory Visit (INDEPENDENT_AMBULATORY_CARE_PROVIDER_SITE_OTHER): Payer: Medicare HMO | Admitting: Internal Medicine

## 2022-03-31 ENCOUNTER — Encounter: Payer: Self-pay | Admitting: Internal Medicine

## 2022-03-31 VITALS — BP 134/86 | HR 65 | Temp 96.9°F | Wt 274.0 lb

## 2022-03-31 DIAGNOSIS — E039 Hypothyroidism, unspecified: Secondary | ICD-10-CM

## 2022-03-31 DIAGNOSIS — R0789 Other chest pain: Secondary | ICD-10-CM

## 2022-03-31 DIAGNOSIS — F4323 Adjustment disorder with mixed anxiety and depressed mood: Secondary | ICD-10-CM | POA: Diagnosis not present

## 2022-03-31 DIAGNOSIS — E119 Type 2 diabetes mellitus without complications: Secondary | ICD-10-CM | POA: Diagnosis not present

## 2022-03-31 DIAGNOSIS — R0602 Shortness of breath: Secondary | ICD-10-CM

## 2022-03-31 DIAGNOSIS — R0989 Other specified symptoms and signs involving the circulatory and respiratory systems: Secondary | ICD-10-CM

## 2022-03-31 MED ORDER — KETOROLAC TROMETHAMINE 30 MG/ML IJ SOLN
30.0000 mg | Freq: Once | INTRAMUSCULAR | Status: AC
  Administered 2022-03-31: 30 mg via INTRAMUSCULAR

## 2022-04-01 LAB — COMPLETE METABOLIC PANEL WITH GFR
AG Ratio: 1.7 (calc) (ref 1.0–2.5)
ALT: 45 U/L (ref 9–46)
AST: 30 U/L (ref 10–35)
Albumin: 4.5 g/dL (ref 3.6–5.1)
Alkaline phosphatase (APISO): 63 U/L (ref 35–144)
BUN/Creatinine Ratio: 16 (calc) (ref 6–22)
BUN: 21 mg/dL (ref 7–25)
CO2: 30 mmol/L (ref 20–32)
Calcium: 9.5 mg/dL (ref 8.6–10.3)
Chloride: 104 mmol/L (ref 98–110)
Creat: 1.31 mg/dL — ABNORMAL HIGH (ref 0.70–1.30)
Globulin: 2.6 g/dL (calc) (ref 1.9–3.7)
Glucose, Bld: 86 mg/dL (ref 65–139)
Potassium: 4.6 mmol/L (ref 3.5–5.3)
Sodium: 141 mmol/L (ref 135–146)
Total Bilirubin: 1 mg/dL (ref 0.2–1.2)
Total Protein: 7.1 g/dL (ref 6.1–8.1)
eGFR: 64 mL/min/{1.73_m2} (ref >=60)

## 2022-04-01 LAB — CBC
HCT: 46 % (ref 38.5–50.0)
Hemoglobin: 15.6 g/dL (ref 13.2–17.1)
MCH: 31.5 pg (ref 27.0–33.0)
MCHC: 33.9 g/dL (ref 32.0–36.0)
MCV: 92.9 fL (ref 80.0–100.0)
MPV: 9.5 fL (ref 7.5–12.5)
Platelets: 218 10*3/uL (ref 140–400)
RBC: 4.95 10*6/uL (ref 4.20–5.80)
RDW: 12.5 % (ref 11.0–15.0)
WBC: 6.3 10*3/uL (ref 3.8–10.8)

## 2022-04-01 LAB — TSH: TSH: 0.64 mIU/L (ref 0.40–4.50)

## 2022-04-01 LAB — LIPID PANEL
Cholesterol: 169 mg/dL (ref <200)
HDL: 40 mg/dL (ref >=40)
LDL Cholesterol (Calc): 96 mg/dL (calc)
Non-HDL Cholesterol (Calc): 129 mg/dL (calc) (ref <130)
Total CHOL/HDL Ratio: 4.2 (calc) (ref <5.0)
Triglycerides: 236 mg/dL — ABNORMAL HIGH (ref <150)

## 2022-04-01 LAB — HEMOGLOBIN A1C
Hgb A1c MFr Bld: 6 % of total Hgb — ABNORMAL HIGH (ref <5.7)
Mean Plasma Glucose: 126 mg/dL
eAG (mmol/L): 7 mmol/L

## 2022-04-01 LAB — T4, FREE: Free T4: 1 ng/dL (ref 0.8–1.8)

## 2022-04-04 ENCOUNTER — Encounter: Payer: Self-pay | Admitting: Internal Medicine

## 2022-04-04 MED ORDER — ROSUVASTATIN CALCIUM 10 MG PO TABS: 10.0000 mg | ORAL_TABLET | ORAL | 0 refills | Status: DC

## 2022-04-12 ENCOUNTER — Other Ambulatory Visit: Payer: Self-pay | Admitting: Internal Medicine

## 2022-04-13 NOTE — Telephone Encounter (Signed)
Requested medication (s) are due for refill today: yes  Requested medication (s) are on the active medication list: yes    Last refill: 02/28/22  #90  0 refills  Future visit scheduled yes 05/01/22  Notes to clinic:  Not delegated, please review. Thank you.  Requested Prescriptions  Pending Prescriptions Disp Refills   methocarbamol (ROBAXIN) 500 MG tablet [Pharmacy Med Name: METHOCARBAMOL 500 MG TAB] 90 tablet 0    Sig: TAKE 1 TABLET BY MOUTH EVERY 8 HOURS AS NEEDED FOR MUSCLE SPASMS     Not Delegated - Analgesics:  Muscle Relaxants Failed - 04/12/2022  4:18 PM      Failed - This refill cannot be delegated      Passed - Valid encounter within last 6 months    Recent Outpatient Visits           1 week ago Chest congestion   [facility] [place], Coralie Keens, NP   5 months ago Screen for colon cancer   [facility] [employer], Coralie Keens, NP   6 months ago Rectal mass   [facility] [place], Coralie Keens, NP   7 months ago Family history of brain aneurysm   [facility] [place], Coralie Keens, NP   11 months ago DDD (degenerative disc disease), cervical   [facility] [place], Coralie Keens, NP       Future Appointments             In 2 weeks Garnette Gunner, Coralie Keens, NP [facility], [facility]

## 2022-04-21 DIAGNOSIS — F320 Major depressive disorder, single episode, mild: Secondary | ICD-10-CM | POA: Diagnosis not present

## 2022-04-25 ENCOUNTER — Encounter: Payer: Self-pay | Admitting: Internal Medicine

## 2022-05-01 ENCOUNTER — Encounter: Payer: Self-pay | Admitting: Internal Medicine

## 2022-05-01 ENCOUNTER — Ambulatory Visit (INDEPENDENT_AMBULATORY_CARE_PROVIDER_SITE_OTHER): Payer: Medicare HMO | Admitting: Internal Medicine

## 2022-05-01 VITALS — BP 132/74 | HR 60 | Temp 97.1°F | Ht 74.0 in | Wt 274.0 lb

## 2022-05-01 DIAGNOSIS — M549 Dorsalgia, unspecified: Secondary | ICD-10-CM

## 2022-05-01 DIAGNOSIS — M791 Myalgia, unspecified site: Secondary | ICD-10-CM

## 2022-05-01 DIAGNOSIS — Z0001 Encounter for general adult medical examination with abnormal findings: Secondary | ICD-10-CM

## 2022-05-01 DIAGNOSIS — Z6835 Body mass index (BMI) 35.0-35.9, adult: Secondary | ICD-10-CM

## 2022-05-01 NOTE — Patient Instructions (Signed)
Caring for Your Mental Health Mental health is emotional, psychological, and social well-being. Mental health is just as important as physical health. In fact, mental and physical health are connected, and you need both to be healthy. Some signs of good mental health (well-being) include: Being able to attend to tasks at home, school, or work. Being able to manage stress and emotions. Practicing self-care, which may include: A regular exercise pattern. A reasonably healthy diet. Supportive and trusting relationships. The ability to relax and calm yourself (self-calm). Having pleasurable hobbies and activities to do. Believing that you have meaning and purpose in your life. Recovering and adjusting after facing challenges (resilience). You can take steps to build or strengthen these mentally healthy behaviors. There are resources and support to help you with this. Why is caring for mental health important? Caring for your mental health is a big part of staying healthy. Everyone has times when feelings, thoughts, or situations feel overwhelming. Mental health means having the skills to manage what feels overwhelming. If this sense of being overwhelmed persists, however, you might need some help. If you have some of the following signs, you may need to take better care of your mental health or seek help from a health care provider or mental health professional: Problems with energy or focus. Changes in eating habits. Problems sleeping, such as sleeping too much or not enough. Emotional distress, such as anger, sadness, depression, or anxiety. Major changes in your relationships. Losing interest in life or activities that you used to enjoy. If you have any of these symptoms on most days for 2 weeks or longer: Talk with a close friend or family member about how you are feeling. Contact your health care provider to discuss your symptoms. Consider working with a mental health professional. Your  health care provider, family, or friends may be able to recommend a therapist. How to promote emotional and mental health Managing emotions Learn to identify emotions and be honest with yourself about what you are feeling. Recognizing your emotions is the first step in learning to deal with them. Practice ways to appropriately express feelings. Remember that you can control your feelings. They do not control you. Practice stress management techniques, such as: Relaxation techniques, like breathing or muscle relaxation exercises. Exercise. Regular activity can lower your stress level. Changing what you can change and accepting what you cannot change. Build up your resilience so that you can recover and adjust after big problems or challenges. Practice resilient behaviors and attitudes: Set long-term goals and focus on them. Develop and maintain healthy, supportive relationships. Take care of yourself physically by eating a healthy diet, getting plenty of sleep, and exercising regularly. Develop self-awareness. Ask others to give feedback about how they see you. Practice mindfulness meditation to help you stay calm when dealing with daily challenges. Learn to respond to situations in healthy ways, rather than reacting with your emotions. Keep a positive attitude, and believe in yourself. Your view of yourself affects your mental health. Develop your listening and empathy skills. These will help you deal with difficult situations and communications. Practice acts of kindness and helpfulness toward others. Spend time in nature being in the moment and appreciating its beauty. Remember that emotions can be used as a good source of communication and are a great source of energy. Try to laugh and find humor in life. Sleeping Get the right amount and quality of sleep. Sleep has a big impact on physical and mental health. To improve your sleep:   Go to bed and wake up around the same time every  day. Limit screen time before bedtime. This includes the use of your mobile phone, TV, computer, and tablet. Keep your bedroom dark and cool. Activity Exercise or do some physical activity regularly. This helps: Keep your body strong, especially during times of stress. Get rid of chemicals in your body (hormones) that build up when you are stressed. Build up your resilience.  Eating and drinking  Eat a healthy diet that includes whole grains, vegetables, fresh fruits, and lean proteins. If you have questions about what foods are best for you, ask your health care provider. Try not to turn to sweet, salty, or otherwise unhealthy foods when you are tired or unhappy. This can lead to unwanted weight gain and is not a healthy way to cope with emotions. Where to find more information You can find more information and guidance about how to care for your mental health from: [employer] on Mental Illness (NAMI): www.nami.[employer] of Mental Health: https://carter.com/ Centers for Disease Control and Prevention: http://www.wolf.info/ Contact a health care provider if: You lose interest in being with others or you do not want to leave the house. You have a hard time completing your normal activities or you have less energy than normal. You cannot stay focused or you have problems with memory. You feel that your senses are heightened, and this makes you upset or concerned. You feel nervous or have rapid mood changes. You are sleeping or eating more or less than normal. You question reality or you show odd behavior that disturbs you or others. Get help right away if: You have thoughts about hurting yourself or others. Get help right away if you feel like you may hurt yourself or others, or have thoughts about taking your own life. Go to your nearest emergency room or: Call 911. Call the [place] at [phone] or 988 in the [place] This is open 24 hours a  day. Text the Crisis Text Line at 8622962016. Summary Mental health is not just the absence of mental illness. It involves understanding your emotions and behaviors, and taking steps to manage them in a healthy way. If you have symptoms of mental or emotional distress, get help from family, friends, a health care provider, or a mental health professional. Practice good mental health behaviors such as stress management skills, self-calming skills, exercise, healthy sleeping and eating, and supportive relationships. This information is not intended to replace advice given to you by your health care provider. Make sure you discuss any questions you have with your health care provider. Document Revised: 04/21/2021 Document Reviewed: 04/15/2021 Elsevier Patient Education  [place].

## 2022-05-01 NOTE — Assessment & Plan Note (Signed)
Encourage diet and exercise for weight loss 

## 2022-05-01 NOTE — Progress Notes (Signed)
Subjective:    Patient ID: Corey Bell, male    DOB: [birth year], 56 y.o.   MRN: [id number]  HPI  Pt presents to the clinic today for his annual exam.  Flu: never Tetanus: 12/2017 Pneumovax: 06/2020 Covid: Moderna x 2 Shingrix: 03/2018, 05/2018 PSA screening: 12/2019 Colon screening: 01/2022 Vision screening: annually Dentist: biannually  Diet: He does eat meat. He consumes fruits and veggies. He tries to avoid fried foods. He drinks mostly water, gatorade, lemonade, tea. 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 90 tablet 1   calcium carbonate (TUMS - DOSED IN MG ELEMENTAL CALCIUM) 500 MG chewable tablet Chew 3 tablets by mouth daily.     citalopram (CELEXA) 40 MG tablet Take 1 tablet (40 mg total) by mouth daily. 90 tablet 1   clonazePAM (KLONOPIN) 0.5 MG tablet Take 1 tablet (0.5 mg total) by mouth daily as needed for anxiety. 20 tablet 0   cyclobenzaprine (FLEXERIL) 10 MG tablet cyclobenzaprine 10 mg tablet     diphenhydrAMINE (BENADRYL) 25 mg capsule Take 50 mg by mouth at bedtime.     erythromycin with ethanol (THERAMYCIN) 2 % external solution APPLY TOPICALLY TWICE A DAY AS NEEDED 60 mL 0   ezetimibe (ZETIA) 10 MG tablet TAKE 1 TABLET BY MOUTH  DAILY 90 tablet 3   fexofenadine (ALLEGRA) 180 MG tablet Take 180 mg by mouth daily.     finasteride (PROPECIA) 1 MG tablet Take 0.25 mg by mouth daily.     fluticasone (CUTIVATE) 0.05 % cream      fluticasone (FLONASE) 50 MCG/ACT nasal spray Place 1 spray into both nostrils 2 (two) times daily. 16 g 2   gabapentin (NEURONTIN) 400 MG capsule Take 400 mg by mouth 2 (two) times daily.     hydrOXYzine (VISTARIL) 25 MG capsule TAKE 1 CAPSULE BY MOUTH EVERY 8 HOURS ASNEEDED 90 capsule 0   levothyroxine (SYNTHROID) 75 MCG tablet TAKE 1 TABLET BY MOUTH DAILY 90 tablet 0   methocarbamol (ROBAXIN) 500 MG tablet TAKE 1 TABLET BY MOUTH EVERY 8 HOURS AS NEEDED FOR MUSCLE SPASMS 90 tablet 0   montelukast (SINGULAIR) 10 MG tablet Take 1 tablet (10 mg total) by mouth at bedtime. 90 tablet 1   pantoprazole (PROTONIX) 40 MG tablet Take 1 tablet (40 mg total) by mouth daily. 90 tablet 1   pramoxine (PROCTOFOAM) 1 % foam Place 1 application rectally 3 (three) times daily as needed for anal itching. 15 g 0   rosuvastatin (CRESTOR) 10 MG tablet Take 1 tablet (10 mg total) by mouth 3 (three)  times a week. 36 tablet 0   sodium chloride (OCEAN) 0.65 % SOLN nasal spray Place 1 spray into both nostrils as needed for congestion. 15 mL 0   tamsulosin (FLOMAX) 0.4 MG CAPS capsule Take 2 capsules (0.8 mg total) by mouth at bedtime. 180 capsule 1   traMADol (ULTRAM) 50 MG tablet TAKE 1 TABLET BY MOUTH EVERY 8 HOURS AS NEEDED FOR MODERATE TO SEVERE PAIN 30 tablet 0   No current facility-administered medications for this visit.    Allergies  Allergen Reactions   Augmentin [Amoxicillin-Pot Clavulanate] Diarrhea   Demerol [Meperidine] Other (See Comments)    "goes crazy and has the strength of 10 men"   Eggs Or Egg-Derived Products Diarrhea and Nausea And Vomiting   Sulfamethoxazole Other (See Comments)    Blisters form in mouth   Statins Rash   Sulfa Antibiotics Rash   Sulfacetamide Sodium Rash    Family History   Problem Relation Age of Onset   Congestive Heart Failure Father    Heart disease Father    COPD Father    Kidney cancer Paternal Grandfather    Heart disease Brother        stents at 40 yo   Bladder Cancer Paternal Uncle    Lupus Brother    Heart failure Paternal Grandmother    Parkinsonism Neg Hx     Social History   Socioeconomic History   Marital status: Married    Spouse name: [name]   Number of children: 0   Years of education: college   Highest education level: Not on file  Occupational History   Occupation: disabled    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  Tobacco Use   Smoking status: Former    Packs/day: 2.00    Years: 20.00    Total pack years: 40.00    Types: Cigarettes    Quit date: 10/09/2006    Years since quitting: 15.5   Smokeless tobacco: Never  Vaping Use   Vaping Use: Never used  Substance and Sexual Activity   Alcohol use: Yes    Alcohol/week: 1.0 standard drink of alcohol    Types: 1 Cans of beer per week    Comment: rarely   Drug use: No   Sexual activity: Yes    Birth control/protection: Surgical  Other Topics Concern   Not on file  Social History Narrative   12/16/19   From: [place]   Living: with wife [name], since 1990   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      Family: mother and brothers are nearby      Enjoys: record collection with 10,000 LPs - working on digitizing these, fixing up a 1968 mustang       Exercise: not currently   Diet: over-eating, tries to eat somewhat healthy - salad every other day      Safety   Seat belts: Yes    Guns: Yes  and secure   Safe in relationships: Yes    Social Determinants of Health   Financial Resource Strain: Not on file  Food Insecurity: No Food Insecurity (05/24/2021)   Hunger Vital Sign    Worried About Running Out of Food in the Last Year: Never true    [place] in the Last Year: Never true  Transportation Needs: No Transportation Needs (05/24/2021)   PRAPARE  - Hydrologist (Medical): No    Lack of Transportation (Non-Medical): No  Physical Activity: Not on file  Stress: Not on file  Social Connections: Not on file  Intimate Partner Violence: Not on file     Constitutional: Denies fever, malaise, fatigue, headache or abrupt weight changes.  HEENT: Denies eye pain, eye redness, ear pain, ringing in the ears, wax buildup, runny nose, nasal congestion, bloody nose, or sore throat. Respiratory: Denies difficulty breathing, shortness of breath, cough or sputum production.   Cardiovascular: Denies chest pain, chest tightness, palpitations or swelling in the hands or feet.  Gastrointestinal: Pt reports intermittent constipation. Denies abdominal pain, bloating, diarrhea or blood in the stool.  GU: Denies urgency, frequency, pain with urination, burning sensation, blood in urine, odor or discharge. Musculoskeletal: Pt reports back pain. Denies decrease in range of motion, difficulty with gait, or joint swelling.  Skin: Denies redness, rashes, lesions or ulcercations.  Neurological: Pt reports intermittent numbness in feet. Denies dizziness, difficulty with memory, difficulty with speech or problems with balance and coordination.  Psych: Pt has a history of anxiety and depression. Denies SI/HI.  No other specific complaints in a complete review of systems (except as listed in HPI above).  Objective:   Physical Exam  BP 132/74 (BP Location: Left Arm, Patient Position: Sitting, Cuff Size: Large)   Pulse 60   Temp (!) 97.1 F (36.2 C) (Temporal)   Ht '6\' 2"'$  (1.88 m)   Wt 274 lb (124.3 kg)   SpO2 99%   BMI 35.18 kg/m   Wt Readings from Last 3 Encounters:  03/31/22 274 lb (124.3 kg)  01/12/22 265 lb (120.2 kg)  12/15/21 260 lb (117.9 kg)    General: Appears his stated age obese, in NAD. Skin: Warm, dry and intact. No ulcerations noted. HEENT: Head: normal shape and size; Eyes: sclera white, no icterus, conjunctiva  pink, PERRLA and EOMs intact;  Neck:  Neck supple, trachea midline. No masses, lumps or thyromegaly present.  Cardiovascular: Normal rate and rhythm. S1,S2 noted.  No murmur, rubs or gallops noted. No JVD or BLE edema. No carotid bruits noted. Pulmonary/Chest: Normal effort and positive vesicular breath sounds. No respiratory distress. No wheezes, rales or ronchi noted.  Abdomen: Normal bowel sounds.  Musculoskeletal: Strength 5/5 BUE/BLE. No difficulty with gait.  Neurological: Alert and oriented. Cranial nerves II-XII grossly intact. Coordination normal.  Psychiatric: Mood and affect normal. Behavior is normal. Judgment and thought content normal.   BMET    Component Value Date/Time   NA 141 03/31/2022 1437   NA 142 01/27/2018 0000   NA 138 12/26/2012 1936   K 4.6 03/31/2022 1437   K 3.9 12/26/2012 1936   CL 104 03/31/2022 1437   CL 106 12/26/2012 1936   CO2 30 03/31/2022 1437   CO2 24 12/26/2012 1936   GLUCOSE 86 03/31/2022 1437   GLUCOSE 124 (H) 12/26/2012 1936   BUN 21 03/31/2022 1437   BUN 19 01/27/2018 0000   BUN 22 (H) 12/26/2012 1936   CREATININE 1.31 (H) 03/31/2022 1437   CALCIUM 9.5 03/31/2022 1437   CALCIUM 8.4 (L) 12/26/2012 1936   GFRNONAA >60 06/02/2020 0431   GFRNONAA 58 (L) 12/26/2012 1936   GFRAA >60 06/02/2020 0431   GFRAA >60 12/26/2012 1936    Lipid Panel     Component Value Date/Time   CHOL 169 03/31/2022 1437   TRIG 236 (H) 03/31/2022 1437   HDL 40 03/31/2022 1437   CHOLHDL 4.2 03/31/2022 1437   VLDL 29.8 06/10/2020 0919   LDLCALC 96 03/31/2022 1437  CBC    Component Value Date/Time   WBC 6.3 03/31/2022 1437   RBC 4.95 03/31/2022 1437   HGB 15.6 03/31/2022 1437   HGB 15.2 12/26/2012 1936   HCT 46.0 03/31/2022 1437   HCT 44.6 12/26/2012 1936   PLT 218 03/31/2022 1437   PLT 218 12/26/2012 1936   MCV 92.9 03/31/2022 1437   MCV 94 12/26/2012 1936   MCH 31.5 03/31/2022 1437   MCHC 33.9 03/31/2022 1437   RDW 12.5 03/31/2022 1437   RDW  13.3 12/26/2012 1936   LYMPHSABS 1.9 06/02/2020 0431   MONOABS 0.4 06/02/2020 0431   EOSABS 0.2 06/02/2020 0431   BASOSABS 0.1 06/02/2020 0431    Hgb A1C Lab Results  Component Value Date   HGBA1C 6.0 (H) 03/31/2022            Assessment & Plan:    Preventative Health Maintenance:  Encouraged him to get a flu shot in the fall Tetanus UTD Pneumovax UTD Shingrix UTD Colon screening UTD Encouraged him to consume a balanced diet and exercise regimen Advised him to see an eye doctor and dentist annually Recent labs reviewed  RTC in 6 months, follow-up chronic conditions Webb Silversmith, NP

## 2022-05-02 ENCOUNTER — Telehealth: Payer: Self-pay

## 2022-05-02 NOTE — Telephone Encounter (Signed)
I contact the patient to attempt to schedule his AWV, but he declined the visit.

## 2022-05-08 ENCOUNTER — Encounter: Payer: Self-pay | Admitting: Internal Medicine

## 2022-05-08 NOTE — Telephone Encounter (Signed)
Please review.  KP

## 2022-05-08 NOTE — Telephone Encounter (Signed)
Can you send in a prescription for meter, strips and lancets please

## 2022-05-16 ENCOUNTER — Encounter: Payer: Self-pay | Admitting: Internal Medicine

## 2022-05-16 MED ORDER — BLOOD GLUCOSE METER KIT: PACK | 0 refills | Status: AC

## 2022-05-17 ENCOUNTER — Other Ambulatory Visit: Payer: Self-pay | Admitting: Internal Medicine

## 2022-05-17 NOTE — Telephone Encounter (Signed)
Requested Prescriptions  Pending Prescriptions Disp Refills  . buPROPion (WELLBUTRIN XL) 300 MG 24 hr tablet [Pharmacy Med Name: BUPROPION HCL ER (XL) 300 MG TAB] 90 tablet 1    Sig: TAKE 1 TABLET BY MOUTH DAILY     Psychiatry: Antidepressants - bupropion Failed - 05/17/2022  9:09 AM      Failed - Cr in normal range and within 360 days    Creat  Date Value Ref Range Status  03/31/2022 1.31 (H) 0.70 - 1.30 mg/dL Final   Creatinine,U  Date Value Ref Range Status  09/07/2014 133.2 mg/dL Final         Passed - AST in normal range and within 360 days    AST  Date Value Ref Range Status  03/31/2022 30 10 - 35 U/L Final   SGOT(AST)  Date Value Ref Range Status  12/18/2012 47 (H) 15 - 37 Unit/L Final         Passed - ALT in normal range and within 360 days    ALT  Date Value Ref Range Status  03/31/2022 45 9 - 46 U/L Final   SGPT (ALT)  Date Value Ref Range Status  12/18/2012 66 12 - 78 U/L Final         Passed - Last BP in normal range    BP Readings from Last 1 Encounters:  05/01/22 132/74         Passed - Valid encounter within last 6 months    Recent Outpatient Visits          2 weeks ago Myalgia due to statin   [place], Coralie Keens, NP   1 month ago Chest congestion   [facility] [place], Coralie Keens, NP   6 months ago Screen for colon cancer   [facility] [place], Coralie Keens, NP   7 months ago Rectal mass   [facility] [place], Coralie Keens, NP   8 months ago Family history of brain aneurysm   [place], Coralie Keens, NP      Future Appointments            In 5 months Baity, Coralie Keens, NP [facility]           . montelukast (SINGULAIR) 10 MG tablet [Pharmacy Med Name: MONTELUKAST SODIUM 10 MG TAB] 90 tablet 1    Sig: TAKE 1 TABLET BY MOUTH AT BEDTIME     Pulmonology:  Leukotriene Inhibitors Passed - 05/17/2022  9:09 AM      Passed - Valid encounter within  last 12 months    Recent Outpatient Visits          2 weeks ago Myalgia due to statin   [place], Coralie Keens, NP   1 month ago Chest congestion   [facility] [place], Coralie Keens, NP   6 months ago Screen for colon cancer   [place], Coralie Keens, NP   7 months ago Rectal mass   [facility] [place], Coralie Keens, NP   8 months ago Family history of brain aneurysm   [place], NP      Future Appointments            In 5 months Baity, Coralie Keens, NP [facility], [facility]

## 2022-05-26 DIAGNOSIS — F320 Major depressive disorder, single episode, mild: Secondary | ICD-10-CM | POA: Diagnosis not present

## 2022-05-29 ENCOUNTER — Other Ambulatory Visit: Payer: Self-pay | Admitting: Internal Medicine

## 2022-05-30 NOTE — Telephone Encounter (Signed)
Requested medication (s) are due for refill today: yes  Requested medication (s) are on the active medication list: yes  Last refill:  04/13/22 #90/0  Future visit scheduled: yes  Notes to clinic:  Unable to refill per protocol, cannot delegate.    Requested Prescriptions  Pending Prescriptions Disp Refills   methocarbamol (ROBAXIN) 500 MG tablet [Pharmacy Med Name: METHOCARBAMOL 500 MG TAB] 90 tablet 0    Sig: TAKE 1 TABLET BY MOUTH EVERY 8 HOURS AS NEEDED FOR MUSCLE SPASMS     Not Delegated - Analgesics:  Muscle Relaxants Failed - 05/29/2022 11:15 AM      Failed - This refill cannot be delegated      Passed - Valid encounter within last 6 months    Recent Outpatient Visits           4 weeks ago Myalgia due to statin   [facility], Coralie Keens, NP   2 months ago Chest congestion   [facility] [place], Coralie Keens, NP   7 months ago Screen for colon cancer   [facility] [place], Coralie Keens, NP   8 months ago Rectal mass   [facility] [place], Coralie Keens, NP   9 months ago Family history of brain aneurysm   [facility] [place], Coralie Keens, NP       Future Appointments             In 5 months Baity, Coralie Keens, NP [facility], [place]            Signed Prescriptions Disp Refills   citalopram (CELEXA) 40 MG tablet 90 tablet 1    Sig: TAKE 1 TABLET BY MOUTH DAILY     Psychiatry:  Antidepressants - SSRI Passed - 05/29/2022 11:15 AM      Passed - Valid encounter within last 6 months    Recent Outpatient Visits           4 weeks ago Myalgia due to statin   [facility], Coralie Keens, NP   2 months ago Chest congestion   [facility] [place], Coralie Keens, NP   7 months ago Screen for colon cancer   [facility] [place], Coralie Keens, NP   8 months ago Rectal mass   [facility] [place], Coralie Keens, NP   9 months ago Family history of brain  aneurysm   [facility] [place], Coralie Keens, NP       Future Appointments             In 5 months Baity, Coralie Keens, NP [facility], [facility]             levothyroxine (SYNTHROID) 75 MCG tablet 90 tablet 3    Sig: TAKE 1 TABLET BY MOUTH DAILY     Endocrinology:  Hypothyroid Agents Passed - 05/29/2022 11:15 AM      Passed - TSH in normal range and within 360 days    TSH  Date Value Ref Range Status  03/31/2022 0.64 0.40 - 4.50 mIU/L Final         Passed - Valid encounter within last 12 months    Recent Outpatient Visits           4 weeks ago Myalgia due to statin   [place], Coralie Keens, NP   2 months ago Chest congestion  [facility] [place], Coralie Keens, NP   7 months ago Screen for colon cancer   [place], NP   8 months ago Rectal mass   [facility] [place], Coralie Keens, NP   9 months ago Family history of brain aneurysm   [place], NP       Future Appointments             In 5 months Baity, Coralie Keens, NP [facility], [facility]

## 2022-05-30 NOTE — Telephone Encounter (Signed)
Requested Prescriptions  Pending Prescriptions Disp Refills  . methocarbamol (ROBAXIN) 500 MG tablet [Pharmacy Med Name: METHOCARBAMOL 500 MG TAB] 90 tablet 0    Sig: TAKE 1 TABLET BY MOUTH EVERY 8 HOURS AS NEEDED FOR MUSCLE SPASMS     Not Delegated - Analgesics:  Muscle Relaxants Failed - 05/29/2022 11:15 AM      Failed - This refill cannot be delegated      Passed - Valid encounter within last 6 months    Recent Outpatient Visits          4 weeks ago Myalgia due to statin   [facility], Coralie Keens, NP   2 months ago Chest congestion   [facility] [place], Coralie Keens, NP   7 months ago Screen for colon cancer   [facility] [place], Coralie Keens, NP   8 months ago Rectal mass   [facility] [place], Coralie Keens, NP   9 months ago Family history of brain aneurysm   [place], Coralie Keens, NP      Future Appointments            In 5 months Baity, Coralie Keens, NP [facility], [place]           . citalopram ([place]) 40 MG tablet [Pharmacy Med Name: CITALOPRAM HYDROBROMIDE 40 MG TAB] 90 tablet 1    Sig: TAKE 1 TABLET BY MOUTH DAILY     Psychiatry:  Antidepressants - SSRI Passed - 05/29/2022 11:15 AM      Passed - Valid encounter within last 6 months    Recent Outpatient Visits          4 weeks ago Myalgia due to statin   [facility], Coralie Keens, NP   2 months ago Chest congestion   [facility] [place], Coralie Keens, NP   7 months ago Screen for colon cancer   [facility] [place], Coralie Keens, NP   8 months ago Rectal mass   [facility] [place], Coralie Keens, NP   9 months ago Family history of brain aneurysm   [place], Coralie Keens, NP      Future Appointments            In 5 months Baity, Coralie Keens, NP [facility], [place]           . levothyroxine (SYNTHROID) 75 MCG tablet [Pharmacy Med Name:  LEVOTHYROXINE SODIUM 75 MCG TAB] 90 tablet 0    Sig: TAKE 1 TABLET BY MOUTH DAILY     Endocrinology:  Hypothyroid Agents Passed - 05/29/2022 11:15 AM      Passed - TSH in normal range and within 360 days    TSH  Date Value Ref Range Status  03/31/2022 0.64 0.40 - 4.50 mIU/L Final         Passed - Valid encounter within last 12 months    Recent Outpatient Visits          4 weeks ago Myalgia due to statin   [facility] [place], Coralie Keens, NP   2 months ago Chest congestion   [facility] [place], Coralie Keens, NP   7 months ago Screen for colon cancer   [facility] [place], Coralie Keens, NP   8 months ago Rectal mass   [facility] [place], [place],  NP   9 months ago Family history of brain aneurysm   [place], NP      Future Appointments            In 5 months Baity, Coralie Keens, NP [facility], [facility]

## 2022-06-14 ENCOUNTER — Encounter: Payer: Self-pay | Admitting: Internal Medicine

## 2022-06-14 ENCOUNTER — Other Ambulatory Visit: Payer: Self-pay | Admitting: Internal Medicine

## 2022-06-15 MED ORDER — TRAMADOL HCL 50 MG PO TABS: 50.0000 mg | ORAL_TABLET | Freq: Every day | ORAL | 0 refills | Status: DC | PRN

## 2022-06-16 NOTE — Telephone Encounter (Signed)
   Notes to clinic:  Pharm request as follows:Maximum MME cannot be calculated for this prescription. Enter discrete sig details to calculate maximum MME.      Requested Prescriptions  Pending Prescriptions Disp Refills   traMADol (ULTRAM) 50 MG tablet [Pharmacy Med Name: TRAMADOL HCL 50 MG TAB] 30 tablet     Sig: TAKE 1 TABLET BY MOUTH EVERY 8 HOURS AS NEEDED FOR MODERATE TO SEVERE PAIN     Not Delegated - Analgesics:  Opioid Agonists Failed - 06/14/2022  4:42 PM      Failed - This refill cannot be delegated      Failed - Urine Drug Screen completed in last 360 days      Passed - Valid encounter within last 3 months    Recent Outpatient Visits           1 month ago Myalgia due to statin   [place], Coralie Keens, NP   2 months ago Chest congestion   [facility] [place], Coralie Keens, NP   7 months ago Screen for colon cancer   [facility] [place], Coralie Keens, NP   8 months ago Rectal mass   [facility] [place], Coralie Keens, NP   9 months ago Family history of brain aneurysm   [place], NP       Future Appointments             In 4 months Baity, Coralie Keens, NP [facility], [facility]

## 2022-07-04 ENCOUNTER — Other Ambulatory Visit: Payer: Self-pay | Admitting: Internal Medicine

## 2022-07-04 NOTE — Telephone Encounter (Signed)
Requested Prescriptions  Pending Prescriptions Disp Refills  . pantoprazole (PROTONIX) 40 MG tablet [Pharmacy Med Name: PANTOPRAZOLE SODIUM 40 MG DR TAB] 90 tablet 1    Sig: TAKE 1 TABLET BY MOUTH DAILY     Gastroenterology: Proton Pump Inhibitors Passed - 07/04/2022  2:33 PM      Passed - Valid encounter within last 12 months    Recent Outpatient Visits          2 months ago Myalgia due to statin   [facility] [place], Coralie Keens, NP   3 months ago Chest congestion   [facility] [place], Coralie Keens, NP   8 months ago Screen for colon cancer   [facility] [place], Coralie Keens, NP   9 months ago Rectal mass   [facility] [place], Coralie Keens, NP   10 months ago Family history of brain aneurysm   [place], NP      Future Appointments            In 4 months Baity, Coralie Keens, NP [facility], [facility]

## 2022-07-10 ENCOUNTER — Encounter: Payer: Self-pay | Admitting: Internal Medicine

## 2022-07-10 MED ORDER — METHOCARBAMOL 500 MG PO TABS: 500.0000 mg | ORAL_TABLET | Freq: Three times a day (TID) | ORAL | 0 refills | Status: DC | PRN

## 2022-07-17 ENCOUNTER — Other Ambulatory Visit: Payer: Self-pay | Admitting: Internal Medicine

## 2022-07-17 DIAGNOSIS — R3911 Hesitancy of micturition: Secondary | ICD-10-CM

## 2022-07-18 NOTE — Telephone Encounter (Signed)
Requested medications are due for refill today.  Flomax - yes, Erythromycin - unsure  Requested medications are on the active medications list.  yes  Last refill. Flomax 01/09/2022 #180 1 rf, Erythromycin 60 mL 0 rf  Future visit scheduled.   yes  Notes to clinic.  Flomax rf failed protocol d/t expired labs. Erythromycin refill is not delegated.    Requested Prescriptions  Pending Prescriptions Disp Refills   tamsulosin (FLOMAX) 0.4 MG CAPS capsule [Pharmacy Med Name: TAMSULOSIN HCL 0.4 MG CAP] 180 capsule 1    Sig: TAKE 2 CAPSULES BY MOUTH AT BEDTIME     Urology: Alpha-Adrenergic Blocker Failed - 07/17/2022 10:25 AM      Failed - PSA in normal range and within 360 days    PSA  Date Value Ref Range Status  12/16/2019 0.23 0.10 - 4.00 ng/mL Final    Comment:    Test performed using Access [employer]ch PSA Assay, a parmagnetic partical, chemiluminecent immunoassay.         Passed - Last BP in normal range    BP Readings from Last 1 Encounters:  05/01/22 132/74         Passed - Valid encounter within last 12 months    Recent Outpatient Visits           2 months ago Myalgia due to statin   [facility], Coralie Keens, NP   3 months ago Chest congestion   [facility] [place], Coralie Keens, NP   8 months ago Screen for colon cancer   [facility] [place], Coralie Keens, NP   9 months ago Rectal mass   [facility] [place], Coralie Keens, NP   10 months ago Family history of brain aneurysm   [place], NP       Future Appointments             In 3 months [place], Coralie Keens, NP [facility]             erythromycin with ethanol [facility]) 2 % external solution [Pharmacy Med Name: ERYTHROMYCIN 2% TOP SOLN ML] 60 mL 0    Sig: APPLY TOPICALLY TWICE A DAY AS NEEDED     Off-Protocol Failed - 07/17/2022 10:25 AM      Failed - Medication not assigned to a protocol, review manually.       Passed - Valid encounter within last 12 months    Recent Outpatient Visits           2 months ago Myalgia due to statin   [place], Coralie Keens, NP   3 months ago Chest congestion   [facility] [place], Coralie Keens, NP   8 months ago Screen for colon cancer   [facility] [place], Coralie Keens, NP   9 months ago Rectal mass   [facility] [place], Coralie Keens, NP   10 months ago Family history of brain aneurysm   [place], NP       Future Appointments             In 3 months Baity, Coralie Keens, NP [facility], [facility]

## 2022-07-25 ENCOUNTER — Encounter: Payer: Self-pay | Admitting: Internal Medicine

## 2022-07-31 DIAGNOSIS — M7061 Trochanteric bursitis, right hip: Secondary | ICD-10-CM | POA: Diagnosis not present

## 2022-08-14 DIAGNOSIS — E119 Type 2 diabetes mellitus without complications: Secondary | ICD-10-CM | POA: Diagnosis not present

## 2022-08-14 LAB — HM DIABETES EYE EXAM

## 2022-08-15 ENCOUNTER — Telehealth: Payer: Self-pay

## 2022-08-15 NOTE — Telephone Encounter (Signed)
I left a message for the patient to call back and schedule their Medicare Annual Wellness Visit (AWV) virtually, by telephone, or face-to-face.   Ed Mandich, CMA [phone]  

## 2022-08-17 DIAGNOSIS — H524 Presbyopia: Secondary | ICD-10-CM | POA: Diagnosis not present

## 2022-08-21 ENCOUNTER — Encounter: Payer: Self-pay | Admitting: Internal Medicine

## 2022-08-21 MED ORDER — METHOCARBAMOL 500 MG PO TABS: 500.0000 mg | ORAL_TABLET | Freq: Three times a day (TID) | ORAL | 0 refills | Status: DC | PRN

## 2022-08-22 ENCOUNTER — Encounter: Payer: Self-pay | Admitting: Pulmonary Disease

## 2022-08-22 ENCOUNTER — Ambulatory Visit: Payer: Medicare HMO | Admitting: Pulmonary Disease

## 2022-08-22 VITALS — BP 124/84 | HR 54 | Temp 98.0°F | Ht 74.0 in | Wt 273.6 lb

## 2022-08-22 DIAGNOSIS — G4733 Obstructive sleep apnea (adult) (pediatric): Secondary | ICD-10-CM

## 2022-08-22 DIAGNOSIS — R0789 Other chest pain: Secondary | ICD-10-CM

## 2022-08-22 DIAGNOSIS — R0602 Shortness of breath: Secondary | ICD-10-CM | POA: Diagnosis not present

## 2022-08-22 MED ORDER — ALBUTEROL SULFATE HFA 108 (90 BASE) MCG/ACT IN AERS: 2.0000 | INHALATION_SPRAY | Freq: Four times a day (QID) | RESPIRATORY_TRACT | 2 refills | Status: DC | PRN

## 2022-08-22 NOTE — Patient Instructions (Signed)
You did not show any evidence of inflammation on your airway today which is good.  We are going to repeat breathing tests.  In the past these were normal.  I am going to send a prescription of a emergency inhaler that you can use just as needed to see if it helps with clearing the pressure in your chest and your mucus.  We have placed another consult for the cardiologist as I am not convinced that the pressure in your chest is all related to your lungs.  Have sent a prescription to [facility] for a new CPAP machine with supplies.   See you in follow-up in 2 to 3 months time call sooner should any new problems arise.

## 2022-08-22 NOTE — Progress Notes (Signed)
Subjective:    Patient ID: Corey Bell, male    DOB: 02/03/1966, 56 y.o.   MRN: [id number] Patient Care Team: Jearld Fenton, NP as PCP - General (Internal Medicine)  Chief Complaint  Patient presents with   Follow-up    COPD. Chest discomfort. No SOB or Wheezing. CPAP 7-9 hours a night   HPI Patient is a 56 year old former smoker (quit 2008, 40 PY) previously seen here on 17 August 2020, patient failed to follow-up as instructed after that.  He presents today because he states that he initially got better after his visit in 2021 but now has started having symptoms again of chest pressure and that his breathing is restricted.  Symptoms started back approximately 2 months ago.  He has not noted any shortness of breath on exertion or wheezing per se.  Just the sensation of chest tightness.  When he has the chest tightness he may have a mild sensation of shortness of breath which is transient.  He does not note any aggravating nor mitigating circumstance.  He has not had cardiac evaluation.  There is significant cardiac disease in the family.  He also has issues with obstructive sleep apnea and has been on CPAP at 15 cm of water pressure.  He uses the device 7 to 9 hours per night.  Has been compliant and notes improvement on sleep and alertness even today with the device.  Device is old and no download is available.  He states that he cannot sleep without it.  At his initial visit we had recommended [facility] however he never followed through with this after using it for a limited time as he did not think that it did much for him though he states it did used to clear his secretions.  He did have pulmonary function testing performed on 20 September 2020 that showed that he had minimal airway obstruction with airways reversibility increase airway resistance and normal diffusion capacity.  DATA 10/08/2020 PFTs: FEV1 4.31 L or 98% predicted, FVC 5.72 L or 100% predicted, FEV1/FVC 75%, minimal  bronchodilator response, small airways component, diffusion capacity normal.  Very minimal obstruction with minimal bronchodilator response.  Review of Systems A 10 point review of systems was performed and it is as noted above otherwise negative.  Past Medical History:  Diagnosis Date   Anxiety    Aphthous ulcer 10/04/2012   improving   Arthritis    left index fnger   Carpal tunnel syndrome, bilateral    damaged nerve in neck   Chronic pain syndrome    COPD (chronic obstructive pulmonary disease) (HCC)    mild   Depression    Diverticulitis of colon    Esophageal reflux    H/O acute prostatitis    Hypothyroidism    Insomnia, unspecified    Mixed hyperlipidemia    Peyronie disease 12/20/2012   PER uROLOGY   Seasonal allergies    Sleep apnea    uses cpap   Tympanic membrane perforation    history of   Past Surgical History:  Procedure Laterality Date   COLONOSCOPY WITH PROPOFOL N/A 12/15/2021   Procedure: COLONOSCOPY WITH PROPOFOL;  Surgeon: Jonathon Bellows, MD;  [facility] END[facility]COPY;  Service: Gastroenterology;  Laterality: N/A;   COLONOSCOPY WITH PROPOFOL N/A 01/12/2022   Procedure: COLONOSCOPY WITH PROPOFOL;  Surgeon: Jonathon Bellows, MD;  [facility];  Service: Gastroenterology;  Laterality: N/A;   LAPAROSCOPIC GASTRIC SLEEVE RESECTION N/A 07/31/2017   Procedure: LAPAROSCOPIC GASTRIC SLEEVE RESECTION  WITH UPPER ENDOSCOPY;  Surgeon: Alphonsa Overall, MD;  Location: [facility] ORS;  Service: General;  Laterality: N/A;   LumbarSacral Disc Surgery     L5-S1   MENISCUS REPAIR Left    torn meniscus   NESBIT PROCEDURE N/A 08/22/2013   Procedure: 16 DOT PLACTATION;  Surgeon: Claybon Jabs, MD;  Location: [place];  Service: Urology;  Laterality: N/A;   TYMPANOSTOMY TUBE PLACEMENT  1975   ULNAR COLLATERAL LIGAMENT REPAIR Right 04/17/2019   Procedure: RIGHT THUMB ULNAR COLLATERAL LIGAMENT REPAIR;  Surgeon: Leanora Cover, MD;  Location: [place];   Service: Orthopedics;  Laterality: Right;   ULNAR COLLATERAL LIGAMENT REPAIR Right 10/16/2019   Procedure: RIGHT THUMB ULNAR COLLATERAL LIGAMENT RECONSTRUCTION;  Surgeon: Leanora Cover, MD;  Location: [place];  Service: Orthopedics;  Laterality: Right;  block in preop   VASECTOMY Bilateral 08/22/2013   Procedure: VASECTOMY;  Surgeon: Claybon Jabs, MD;  [facility];  Service: Urology;  Laterality: Bilateral;   Patient Active Problem List   Diagnosis Date Noted   Myalgia due to statin 05/01/2022   Chronic upper back pain 04/25/2021   Class 2 obesity due to excess calories with body mass index (BMI) of 35.0 to 35.9 in adult 04/25/2021   BPH (benign prostatic hyperplasia) 03/02/2020   Iron deficiency anemia 03/02/2020   Adjustment reaction with anxiety and depression 03/06/2019   Post-traumatic stress disorder, acute 01/27/2019   Diabetes mellitus type 2, diet-controlled ([place]) 07/31/2017   GERD (gastroesophageal reflux disease) 04/07/2015   HLD (hyperlipidemia) 06/03/2014   DDD (degenerative disc disease), cervical 10/13/2013   DDD (degenerative disc disease), lumbosacral 10/13/2013   OSA (obstructive sleep apnea) 07/01/2013   Essential hypertension 05/29/2013   Hypothyroidism    Family History  Problem Relation Age of Onset   Congestive Heart Failure Father    Heart disease Father    COPD Father    Kidney cancer Paternal Grandfather    Heart disease Brother        stents at 76 yo   Bladder Cancer Paternal Uncle    Lupus Brother    Heart failure Paternal Grandmother    Parkinsonism Neg Hx    Social History   Tobacco Use   Smoking status: Former    Packs/day: 2.00    Years: 20.00    Total pack years: 40.00    Types: Cigarettes    Quit date: 10/09/2006    Years since quitting: 15.8   Smokeless tobacco: Never  Substance Use Topics   Alcohol use: Yes    Alcohol/week: 1.0 standard drink of alcohol    Types: 1 Cans of beer per week     Comment: rarely   Allergies  Allergen Reactions   Augmentin [Amoxicillin-Pot Clavulanate] Diarrhea   Demerol [Meperidine] Other (See Comments)    "goes crazy and has the strength of 10 men"   Eggs Or Egg-Derived Products Diarrhea and Nausea And Vomiting   Sulfamethoxazole Other (See Comments)    Blisters form in mouth   Statins Rash   Sulfa Antibiotics Rash   Sulfacetamide Sodium Rash   Current Meds  Medication Sig   blood glucose meter kit and supplies Dispense based on patient and insurance preference. Use up to four times daily as directed. (FOR ICD-10 E10.9, E11.9).   buPROPion (WELLBUTRIN XL) 300 MG 24 hr tablet TAKE 1 TABLET BY MOUTH DAILY   calcium carbonate (TUMS - DOSED IN MG ELEMENTAL CALCIUM) 500 MG chewable tablet  Chew 3 tablets by mouth daily.   citalopram (CELEXA) 40 MG tablet TAKE 1 TABLET BY MOUTH DAILY   clonazePAM (KLONOPIN) 0.5 MG tablet Take 1 tablet (0.5 mg total) by mouth daily as needed for anxiety.   cyclobenzaprine (FLEXERIL) 10 MG tablet cyclobenzaprine 10 mg tablet   diphenhydrAMINE (BENADRYL) 25 mg capsule Take 50 mg by mouth at bedtime.   erythromycin with ethanol (THERAMYCIN) 2 % external solution APPLY TOPICALLY TWICE A DAY AS NEEDED   ezetimibe (ZETIA) 10 MG tablet TAKE 1 TABLET BY MOUTH DAILY   fexofenadine (ALLEGRA) 180 MG tablet Take 180 mg by mouth daily.   finasteride (PROPECIA) 1 MG tablet Take 0.25 mg by mouth daily.   fluticasone (FLONASE) 50 MCG/ACT nasal spray Place 1 spray into both nostrils 2 (two) times daily.   gabapentin (NEURONTIN) 400 MG capsule Take 400 mg by mouth 2 (two) times daily.   hydrOXYzine (VISTARIL) 25 MG capsule TAKE 1 CAPSULE BY MOUTH EVERY 8 HOURS ASNEEDED   levothyroxine (SYNTHROID) 75 MCG tablet TAKE 1 TABLET BY MOUTH DAILY   methocarbamol (ROBAXIN) 500 MG tablet Take 1 tablet (500 mg total) by mouth every 8 (eight) hours as needed for muscle spasms.   montelukast (SINGULAIR) 10 MG tablet TAKE 1 TABLET BY MOUTH AT  BEDTIME   pantoprazole (PROTONIX) 40 MG tablet TAKE 1 TABLET BY MOUTH DAILY   Pseudoephedrine-guaiFENesin (MUCINEX D MAX STRENGTH PO) Take 1 tablet by mouth every 12 (twelve) hours.   sodium chloride (OCEAN) 0.65 % SOLN nasal spray Place 1 spray into both nostrils as needed for congestion.   tamsulosin (FLOMAX) 0.4 MG CAPS capsule TAKE 2 CAPSULES BY MOUTH AT BEDTIME   traMADol (ULTRAM) 50 MG tablet Take 1 tablet (50 mg total) by mouth daily as needed.   Immunization History  Administered Date(s) Administered   Moderna Sars-Covid-2 Vaccination 01/26/2020, 02/23/2020   Pneumococcal Polysaccharide-23 06/10/2020   Tdap 12/13/2017   Zoster Recombinat (Shingrix) 03/21/2018, 05/21/2018       Objective:   Physical Exam BP 124/84 (BP Location: Left Arm, Cuff Size: Large)   Pulse (!) 54   Temp 98 F (36.7 C)   Ht _0  (1.88 m)   Wt 273 lb 9.6 oz (124.1 kg)   SpO2 97%   BMI 35.13 kg/m  GENERAL: Obese gentleman, no acute distress, fully ambulatory.  No conversational dyspnea HEAD: Normocephalic, atraumatic.  EYES: Pupils equal, round, reactive to light.  No scleral icterus.  MOUTH: Nose/mouth/throat not examined due to masking requirements for COVID 19. NECK: Supple. No thyromegaly. Trachea midline. No JVD.  No adenopathy. PULMONARY: Good air entry bilaterally.  No adventitious sounds. CARDIOVASCULAR: S1 and S2. Regular rate and rhythm.  No rubs, murmurs or gallops heard.   ABDOMEN: Significant truncal obesity, otherwise benign. MUSCULOSKELETAL: No joint deformity, no clubbing, no edema.  NEUROLOGIC: No focal deficit, no gait disturbance, speech is fluent. SKIN: Intact,warm,dry. PSYCH: Mood and behavior normal.   FeNO: Normal     Assessment & Plan:     ICD-10-CM   1. SOB (shortness of breath)  R06.02 Pulmonary Function Test [facility] Only   We will reassess with PFTs Prior PFTs with minimal obstruction Recommend cardiac ration as well    2. Chest pressure  R07.89 Ambulatory  referral to Cardiology   Will refer to cardiology Patient with significant risk factors for coronary disease Prior PFTs do not explain chest pressure    3. OSA (obstructive sleep apnea)  G47.33 AMB REFERRAL FOR DME   Patient  needs updated equipment Has been compliant 7 to 9 hours per night Notes benefit from therapy Notes inability to sleep unless wears the device     Orders Placed This Encounter  Procedures   Ambulatory referral to Cardiology    Referral Priority:   Routine    Referral Type:   Consultation    Referral Reason:   Specialty Services Required    Requested Specialty:   Cardiology    Number of Visits Requested:   1   AMB REFERRAL FOR DME    Referral Priority:   Routine    Referral Type:   Durable Medical Equipment Purchase    Number of Visits Requested:   1   Pulmonary Function Test [facility] Only    Next available    Standing Status:   Future    Standing Expiration Date:   08/23/2023    Order Specific Question:   Full PFT: includes the following: basic spirometry, spirometry pre & post bronchodilator, diffusion capacity (DLCO), lung volumes    Answer:   Full PFT    Order Specific Question:   This test can only be performed at    Answer:   [place] ordered this encounter  Medications   albuterol (VENTOLIN HFA) 108 (90 Base) MCG/ACT inhaler    Sig: Inhale 2 puffs into the lungs every 6 (six) hours as needed for wheezing or shortness of breath.    Dispense:  8 g    Refill:  2   We will reassess the patient with pulmonary function testing.  For the time being we will provide him with a as needed albuterol.  His prior obstructive defect was very minimal and does not explain the symptoms he is having fully.  He will be referred to cardiology as he has a strong family history of cardiac disease and I cannot explain his chest pressure on asthma or similar process.  Patient is in need of a new CPAP machine, he was diagnosed with obstructive sleep apnea in  2014.  This will be ordered for the patient.  We will see the patient in follow-up in 2 to 3 months time call sooner should any new problems arise.   Renold Don, MD Advanced Bronchoscopy P[facility]M [facility]    *This note was dictated using voice recognition software/Dragon.  Despite best efforts to proofread, errors can occur which can change the meaning. Any transcriptional errors that result from this process are unintentional and may not be fully corrected at the time of dictation.

## 2022-08-24 ENCOUNTER — Other Ambulatory Visit: Payer: Self-pay | Admitting: Internal Medicine

## 2022-08-24 NOTE — Telephone Encounter (Signed)
Requested medication (s) are due for refill today:routing for review  Requested medication (s) are on the active medication list:yes  Last refill:  02/23/22  Future visit scheduled: yes  Notes to clinic:  Unable to refill per protocol, last refill by another provider. Historical medication, routing for review.     Requested Prescriptions  Pending Prescriptions Disp Refills   gabapentin (NEURONTIN) 800 MG tablet [Pharmacy Med Name: GABAPENTIN 800 MG TAB] 360 tablet     Sig: TAKE 1 TABLET BY MOUTH 4 TIMES DAILY     Neurology: Anticonvulsants - gabapentin Failed - 08/24/2022 10:07 AM      Failed - Cr in normal range and within 360 days    Creat  Date Value Ref Range Status  03/31/2022 1.31 (H) 0.70 - 1.30 mg/dL Final   Creatinine,U  Date Value Ref Range Status  09/07/2014 133.2 mg/dL Final         Passed - Completed PHQ-2 or PHQ-9 in the last 360 days      Passed - Valid encounter within last 12 months    Recent Outpatient Visits           3 months ago Encounter for general adult medical examination with abnormal findings   [facility] [place], Coralie Keens, NP   4 months ago Chest congestion   [facility] [place], Coralie Keens, NP   9 months ago Screen for colon cancer   [facility] [place], Coralie Keens, NP   11 months ago Rectal mass   [facility] [place], Coralie Keens, NP   12 months ago Family history of brain aneurysm   [facility] [place], Coralie Keens, NP       Future Appointments             In 3 weeks Rockey Situ, Kathlene November, MD [place]. [place]   In 2 months Baity, Coralie Keens, NP [facility], [facility]

## 2022-08-25 ENCOUNTER — Encounter: Payer: Self-pay | Admitting: Pulmonary Disease

## 2022-09-18 ENCOUNTER — Ambulatory Visit: Payer: Medicare HMO | Admitting: Cardiovascular Disease

## 2022-09-22 ENCOUNTER — Telehealth: Payer: Self-pay | Admitting: Internal Medicine

## 2022-09-22 NOTE — Telephone Encounter (Signed)
Left message for patient to call back and schedule Medicare Annual Wellness Visit (AWV) to be done virtually or by telephone.  No hx of AWV eligible as of 02/07/16  Please schedule at anytime with [facility].      77 Minutes appointment   Any questions, please call me at [phone]

## 2022-09-26 ENCOUNTER — Other Ambulatory Visit: Payer: Self-pay | Admitting: Internal Medicine

## 2022-09-27 NOTE — Telephone Encounter (Signed)
Requested Prescriptions  Pending Prescriptions Disp Refills   hydrOXYzine (VISTARIL) 25 MG capsule [Pharmacy Med Name: HYDROXYZINE PAMOATE 25 MG CAP] 90 capsule 0    Sig: TAKE 1 CAPSULE BY MOUTH EVERY 8 HOURS ASNEEDED     Ear, Nose, and Throat:  Antihistamines 2 Failed - 09/26/2022  1:24 PM      Failed - Cr in normal range and within 360 days    Creat  Date Value Ref Range Status  03/31/2022 1.31 (H) 0.70 - 1.30 mg/dL Final   Creatinine,U  Date Value Ref Range Status  09/07/2014 133.2 mg/dL Final         Passed - Valid encounter within last 12 months    Recent Outpatient Visits           4 months ago Encounter for general adult medical examination with abnormal findings   [facility] [place], Coralie Keens, NP   6 months ago Chest congestion   [facility] [place], Coralie Keens, NP   11 months ago Screen for colon cancer   [facility] [place], Coralie Keens, NP   1 year ago Rectal mass   [facility] [place], Coralie Keens, NP   1 year ago Family history of brain aneurysm   [facility] [place], Coralie Keens, NP       Future Appointments             In 1 month Gollan, Kathlene November, MD [place]. [place]   In 1 month Baity, Coralie Keens, NP [facility], [facility]

## 2022-10-04 ENCOUNTER — Other Ambulatory Visit: Payer: Self-pay | Admitting: Internal Medicine

## 2022-10-06 NOTE — Telephone Encounter (Signed)
Unable to refill per protocol, Rx request is too soon. Last refill 08/21/22 for 90 days. Will refuse.  Requested Prescriptions  Pending Prescriptions Disp Refills   methocarbamol (ROBAXIN) 500 MG tablet [Pharmacy Med Name: METHOCARBAMOL 500 MG TAB] 90 tablet 0    Sig: TAKE 1 TABLET BY MOUTH EVERY 8 HOURS AS NEEDED FOR MUSCLE SPASMS     Not Delegated - Analgesics:  Muscle Relaxants Failed - 10/04/2022 11:56 AM      Failed - This refill cannot be delegated      Passed - Valid encounter within last 6 months    Recent Outpatient Visits           5 months ago Encounter for general adult medical examination with abnormal findings   [place], NP   6 months ago Chest congestion   [facility] [place], Coralie Keens, NP   11 months ago Screen for colon cancer   [facility] [place], Coralie Keens, NP   1 year ago Rectal mass   [facility] [place], Coralie Keens, NP   1 year ago Family history of brain aneurysm   [facility] [place], Coralie Keens, NP       Future Appointments             In 3 weeks Gollan, Kathlene November, MD [place]. [place]   In 3 weeks Baity, Coralie Keens, NP [facility], [facility]

## 2022-10-11 ENCOUNTER — Encounter: Payer: Self-pay | Admitting: Internal Medicine

## 2022-10-11 MED ORDER — METHOCARBAMOL 500 MG PO TABS: 500.0000 mg | ORAL_TABLET | Freq: Three times a day (TID) | ORAL | 0 refills | Status: DC | PRN

## 2022-10-23 ENCOUNTER — Ambulatory Visit: Payer: Self-pay | Admitting: *Deleted

## 2022-10-23 ENCOUNTER — Encounter: Payer: Self-pay | Admitting: Internal Medicine

## 2022-10-23 ENCOUNTER — Ambulatory Visit: Payer: Medicare HMO | Admitting: Cardiovascular Disease

## 2022-10-23 ENCOUNTER — Ambulatory Visit (INDEPENDENT_AMBULATORY_CARE_PROVIDER_SITE_OTHER): Payer: Medicare HMO | Admitting: Internal Medicine

## 2022-10-23 VITALS — BP 134/70 | HR 62 | Temp 96.8°F | Wt 273.0 lb

## 2022-10-23 DIAGNOSIS — I10 Essential (primary) hypertension: Secondary | ICD-10-CM

## 2022-10-23 DIAGNOSIS — E782 Mixed hyperlipidemia: Secondary | ICD-10-CM

## 2022-10-23 DIAGNOSIS — G8929 Other chronic pain: Secondary | ICD-10-CM

## 2022-10-23 DIAGNOSIS — K219 Gastro-esophageal reflux disease without esophagitis: Secondary | ICD-10-CM | POA: Diagnosis not present

## 2022-10-23 DIAGNOSIS — M791 Myalgia, unspecified site: Secondary | ICD-10-CM

## 2022-10-23 DIAGNOSIS — G4733 Obstructive sleep apnea (adult) (pediatric): Secondary | ICD-10-CM | POA: Diagnosis not present

## 2022-10-23 DIAGNOSIS — M5030 Other cervical disc degeneration, unspecified cervical region: Secondary | ICD-10-CM

## 2022-10-23 DIAGNOSIS — N1831 Chronic kidney disease, stage 3a: Secondary | ICD-10-CM

## 2022-10-23 DIAGNOSIS — F4323 Adjustment disorder with mixed anxiety and depressed mood: Secondary | ICD-10-CM

## 2022-10-23 DIAGNOSIS — N400 Enlarged prostate without lower urinary tract symptoms: Secondary | ICD-10-CM

## 2022-10-23 DIAGNOSIS — F4311 Post-traumatic stress disorder, acute: Secondary | ICD-10-CM

## 2022-10-23 DIAGNOSIS — E038 Other specified hypothyroidism: Secondary | ICD-10-CM

## 2022-10-23 DIAGNOSIS — Z125 Encounter for screening for malignant neoplasm of prostate: Secondary | ICD-10-CM

## 2022-10-23 DIAGNOSIS — E119 Type 2 diabetes mellitus without complications: Secondary | ICD-10-CM

## 2022-10-23 DIAGNOSIS — M549 Dorsalgia, unspecified: Secondary | ICD-10-CM

## 2022-10-23 DIAGNOSIS — R3911 Hesitancy of micturition: Secondary | ICD-10-CM

## 2022-10-23 DIAGNOSIS — M5137 Other intervertebral disc degeneration, lumbosacral region: Secondary | ICD-10-CM

## 2022-10-23 DIAGNOSIS — Z6835 Body mass index (BMI) 35.0-35.9, adult: Secondary | ICD-10-CM

## 2022-10-23 LAB — POCT GLYCOSYLATED HEMOGLOBIN (HGB A1C): Hemoglobin A1C: 6.2 % — AB (ref 4.0–5.6)

## 2022-10-23 MED ORDER — MONTELUKAST SODIUM 10 MG PO TABS: 10.0000 mg | ORAL_TABLET | Freq: Every day | ORAL | 1 refills | Status: DC

## 2022-10-23 MED ORDER — CITALOPRAM HYDROBROMIDE 40 MG PO TABS: 40.0000 mg | ORAL_TABLET | Freq: Every day | ORAL | 1 refills | Status: DC

## 2022-10-23 MED ORDER — PANTOPRAZOLE SODIUM 40 MG PO TBEC: 40.0000 mg | DELAYED_RELEASE_TABLET | Freq: Every day | ORAL | 1 refills | Status: DC

## 2022-10-23 MED ORDER — BUPROPION HCL ER (XL) 300 MG PO TB24: 300.0000 mg | ORAL_TABLET | Freq: Every day | ORAL | 1 refills | Status: DC

## 2022-10-23 MED ORDER — EZETIMIBE 10 MG PO TABS: 10.0000 mg | ORAL_TABLET | Freq: Every day | ORAL | 1 refills | Status: DC

## 2022-10-23 MED ORDER — GABAPENTIN 800 MG PO TABS: 800.0000 mg | ORAL_TABLET | Freq: Two times a day (BID) | ORAL | 0 refills | Status: DC

## 2022-10-23 MED ORDER — TAMSULOSIN HCL 0.4 MG PO CAPS: 0.8000 mg | ORAL_CAPSULE | Freq: Every day | ORAL | 1 refills | Status: DC

## 2022-10-23 NOTE — Assessment & Plan Note (Signed)
Controlled off meds Reinforced DASH diet and exercise for weight loss

## 2022-10-23 NOTE — Telephone Encounter (Signed)
Reason for Disposition . Systolic BP  >= 356 OR Diastolic >= 701  Answer Assessment - Initial Assessment Questions 1. BLOOD PRESSURE: "What is the blood pressure?" "Did you take at least two measurements 5 minutes apart?"     144/73, 59 ( 189/100, 128/78-this weekend) 2. ONSET: "When did you take your blood pressure?"     Elevations Saturday/Sunday 3. HOW: "How did you take your blood pressure?" (e.g., automatic home BP monitor, visiting nurse)     Automatic cuff-wrist 4. HISTORY: "Do you have a history of high blood pressure?"     yes 5. MEDICINES: "Are you taking any medicines for blood pressure?" "Have you missed any doses recently?"     Yes, no missed doses 6. OTHER SYMPTOMS: "Do you have any symptoms?" (e.g., blurred vision, chest pain, difficulty breathing, headache, weakness)     Elevated fasting glucose 140, breathing trouble- sees pulmonology - has breathing test scheduled tomorrow  Protocols used: Blood Pressure - High-A-AH

## 2022-10-23 NOTE — Assessment & Plan Note (Signed)
Continue Flomax

## 2022-10-23 NOTE — Patient Instructions (Signed)
Hypertension, Adult Hypertension is another name for high blood pressure. High blood pressure forces your heart to work harder to pump blood. This can cause problems over time. There are two numbers in a blood pressure reading. There is a top number (systolic) over a bottom number (diastolic). It is best to have a blood pressure that is below 120/80. What are the causes? The cause of this condition is not known. Some other conditions can lead to high blood pressure. What increases the risk? Some lifestyle factors can make you more likely to develop high blood pressure: Smoking. Not getting enough exercise or physical activity. Being overweight. Having too much fat, sugar, calories, or salt (sodium) in your diet. Drinking too much alcohol. Other risk factors include: Having any of these conditions: Heart disease. Diabetes. High cholesterol. Kidney disease. Obstructive sleep apnea. Having a family history of high blood pressure and high cholesterol. Age. The risk increases with age. Stress. What are the signs or symptoms? High blood pressure may not cause symptoms. Very high blood pressure (hypertensive crisis) may cause: Headache. Fast or uneven heartbeats (palpitations). Shortness of breath. Nosebleed. Vomiting or feeling like you may vomit (nauseous). Changes in how you see. Very bad chest pain. Feeling dizzy. Seizures. How is this treated? This condition is treated by making healthy lifestyle changes, such as: Eating healthy foods. Exercising more. Drinking less alcohol. Your doctor may prescribe medicine if lifestyle changes do not help enough and if: Your top number is above 130. Your bottom number is above 80. Your personal target blood pressure may vary. Follow these instructions at home: Eating and drinking  If told, follow the DASH eating plan. To follow this plan: Fill one half of your plate at each meal with fruits and vegetables. Fill one fourth of your plate  at each meal with whole grains. Whole grains include whole-wheat pasta, brown rice, and whole-grain bread. Eat or drink low-fat dairy products, such as skim milk or low-fat yogurt. Fill one fourth of your plate at each meal with low-fat (lean) proteins. Low-fat proteins include fish, chicken without skin, eggs, beans, and tofu. Avoid fatty meat, cured and processed meat, or chicken with skin. Avoid pre-made or processed food. Limit the amount of salt in your diet to less than 1,500 mg each day. Do not drink alcohol if: Your doctor tells you not to drink. You are pregnant, may be pregnant, or are planning to become pregnant. If you drink alcohol: Limit how much you have to: 0-1 drink a day for women. 0-2 drinks a day for men. Know how much alcohol is in your drink. In the [place] one drink equals one 12 oz bottle of beer (355 mL), one 5 oz glass of wine (148 mL), or one 1 oz glass of hard liquor (44 mL). Lifestyle  Work with your doctor to stay at a healthy weight or to lose weight. Ask your doctor what the best weight is for you. Get at least 30 minutes of exercise that causes your heart to beat faster (aerobic exercise) most days of the week. This may include walking, swimming, or biking. Get at least 30 minutes of exercise that strengthens your muscles (resistance exercise) at least 3 days a week. This may include lifting weights or doing Pilates. Do not smoke or use any products that contain nicotine or tobacco. If you need help quitting, ask your doctor. Check your blood pressure at home as told by your doctor. Keep all follow-up visits. Medicines Take over-the-counter and prescription medicines  only as told by your doctor. Follow directions carefully. ?Do not skip doses of blood pressure medicine. The medicine does not work as well if you skip doses. Skipping doses also puts you at risk for problems. ?Ask your doctor about side effects or reactions to medicines that you should watch  for. ?Contact a doctor if: ?You think you are having a reaction to the medicine you are taking. ?You have headaches that keep coming back. ?You feel dizzy. ?You have swelling in your ankles. ?You have trouble with your vision. ?Get help right away if: ?You get a very bad headache. ?You start to feel mixed up (confused). ?You feel weak or numb. ?You feel faint. ?You have very bad pain in your: ?Chest. ?Belly (abdomen). ?You vomit more than once. ?You have trouble breathing. ?These symptoms may be an emergency. Get help right away. Call 911. ?Do not wait to see if the symptoms will go away. ?Do not drive yourself to the hospital. ?Summary ?Hypertension is another name for high blood pressure. ?High blood pressure forces your heart to work harder to pump blood. ?For most people, a normal blood pressure is less than 120/80. ?Making healthy choices can help lower blood pressure. If your blood pressure does not get lower with healthy choices, you may need to take medicine. ?This information is not intended to replace advice given to you by your health care provider. Make sure you discuss any questions you have with your health care provider. ?Document Revised: 07/14/2021 Document Reviewed: 07/14/2021 ?Elsevier Patient Education ? 2023 Elsevier Inc. ? ?

## 2022-10-23 NOTE — Assessment & Plan Note (Signed)
C-Met and lipid profile today Encouraged him to do a low-fat diet Continue ezetimibe

## 2022-10-23 NOTE — Progress Notes (Signed)
Subjective:    Patient ID: Corey Bell, male    DOB: 10-Jul-1966, 57 y.o.   MRN: [id number]  HPI  Patient presents to clinic today for follow-up of chronic conditions.  HTN: His BP today is 134/70.  He is not taking any antihypertensive medication at this time.  ECG from 05/2020 reviewed.  HLD: His last LDL was 96, triglycerides 236, 03/2022.  He is taking Ezetimibe as prescribed.  He tries to consume a low-fat diet.  DM2: His last A1c was 6%, 03/2022.  He is not taking any oral diabetic medication at this time.  He does not check his sugars routinely.  He does not check his feet routinely.  His last eye exam was 07/2022.  Flu never.  Pneumovax 06/2020.  COVID Moderna x 2.  GERD: Status post gastric sleeve.  He denies breakthrough on Pantoprazole.  Upper GI from 01/2022 reviewed.  Anxiety, Depression, PTSD: Chronic, managed on Citalopram, Bupropion, Hydroxyzine and Clonazepam as prescribed.  He is not currently seeing a therapist.  He denies SI/HI.  Hypothyroidism: He denies any issues on his current dose of Levothyroxine.  He does not follow with endocrinology.  Chronic Back Pain: Managed with Gabapentin, Methocarbamol, Cyclobenzaprine and Tramadol as needed.  He does not follow with pain management.  COPD: He has intermittent shortness of breath but denies chronic cough.  He is not currently using any inhalers.  He does not smoke.  There are no PFTs on file.  BPH: He reports mainly spraying.  Managed with Flomax.  He does not follow with urology.  OSA: He averages 8 hours of sleep per night with the use of his CPAP.  Sleep study from 11/2012 reviewed.  CKD 3: His last creatinine was 1.38, GFR 58, 03/2022.  He is not currently taking an ACEI/ARB.  He does not follow with nephrology.  Review of Systems     Past Medical History:  Diagnosis Date   Anxiety    Aphthous ulcer 10/04/2012   improving   Arthritis    left index fnger   Carpal tunnel syndrome, bilateral    damaged nerve  in neck   Chronic pain syndrome    COPD (chronic obstructive pulmonary disease) (HCC)    mild   Depression    Diverticulitis of colon    Esophageal reflux    H/O acute prostatitis    Hypothyroidism    Insomnia, unspecified    Mixed hyperlipidemia    Peyronie disease 12/20/2012   PER uROLOGY   Seasonal allergies    Sleep apnea    uses cpap   Tympanic membrane perforation    history of    Current Outpatient Medications  Medication Sig Dispense Refill   blood glucose meter kit and supplies Dispense based on patient and insurance preference. Use up to four times daily as directed. (FOR ICD-10 E10.9, E11.9). 1 each 0   buPROPion (WELLBUTRIN XL) 300 MG 24 hr tablet TAKE 1 TABLET BY MOUTH DAILY 90 tablet 1   calcium carbonate (TUMS - DOSED IN MG ELEMENTAL CALCIUM) 500 MG chewable tablet Chew 3 tablets by mouth daily.     citalopram (CELEXA) 40 MG tablet TAKE 1 TABLET BY MOUTH DAILY 90 tablet 1   clonazePAM (KLONOPIN) 0.5 MG tablet Take 1 tablet (0.5 mg total) by mouth daily as needed for anxiety. 20 tablet 0   cyclobenzaprine (FLEXERIL) 10 MG tablet cyclobenzaprine 10 mg tablet     diphenhydrAMINE (BENADRYL) 25 mg capsule Take 50 mg by mouth  at bedtime.     erythromycin with ethanol (THERAMYCIN) 2 % external solution APPLY TOPICALLY TWICE A DAY AS NEEDED 60 mL 0   ezetimibe (ZETIA) 10 MG tablet TAKE 1 TABLET BY MOUTH DAILY 90 tablet 3   fexofenadine (ALLEGRA) 180 MG tablet Take 180 mg by mouth daily.     fluticasone (CUTIVATE) 0.05 % cream  (Patient not taking: Reported on 08/22/2022)     fluticasone (FLONASE) 50 MCG/ACT nasal spray Place 1 spray into both nostrils 2 (two) times daily. 16 g 2   gabapentin (NEURONTIN) 400 MG capsule Take 400 mg by mouth 2 (two) times daily.     gabapentin (NEURONTIN) 800 MG tablet TAKE 1 TABLET BY MOUTH 4 TIMES DAILY 360 tablet 0   hydrOXYzine (VISTARIL) 25 MG capsule TAKE 1 CAPSULE BY MOUTH EVERY 8 HOURS ASNEEDED 90 capsule 0   levothyroxine (SYNTHROID)  75 MCG tablet TAKE 1 TABLET BY MOUTH DAILY 90 tablet 3   methocarbamol (ROBAXIN) 500 MG tablet Take 1 tablet (500 mg total) by mouth every 8 (eight) hours as needed for muscle spasms. 90 tablet 0   montelukast (SINGULAIR) 10 MG tablet TAKE 1 TABLET BY MOUTH AT BEDTIME 90 tablet 1   pantoprazole (PROTONIX) 40 MG tablet TAKE 1 TABLET BY MOUTH DAILY 90 tablet 1   Pseudoephedrine-guaiFENesin (MUCINEX D MAX STRENGTH PO) Take 1 tablet by mouth every 12 (twelve) hours.     sodium chloride (OCEAN) 0.65 % SOLN nasal spray Place 1 spray into both nostrils as needed for congestion. 15 mL 0   tamsulosin (FLOMAX) 0.4 MG CAPS capsule TAKE 2 CAPSULES BY MOUTH AT BEDTIME 180 capsule 0   traMADol (ULTRAM) 50 MG tablet Take 1 tablet (50 mg total) by mouth daily as needed. 30 tablet 0   No current facility-administered medications for this visit.    Allergies  Allergen Reactions   Augmentin [Amoxicillin-Pot Clavulanate] Diarrhea   Demerol [Meperidine] Other (See Comments)    "goes crazy and has the strength of 10 men"   Eggs Or Egg-Derived Products Diarrhea and Nausea And Vomiting   Sulfamethoxazole Other (See Comments)    Blisters form in mouth   Statins Rash   Sulfa Antibiotics Rash   Sulfacetamide Sodium Rash    Family History  Problem Relation Age of Onset   Congestive Heart Failure Father    Heart disease Father    COPD Father    Kidney cancer Paternal Grandfather    Heart disease Brother        stents at 73 yo   Bladder Cancer Paternal Uncle    Lupus Brother    Heart failure Paternal Grandmother    Parkinsonism Neg Hx     Social History   Socioeconomic History   Marital status: Married    Spouse name: Corey Bell   Number of children: 0   Years of education: college   Highest education level: Not on file  Occupational History   Occupation: disabled    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  Tobacco Use   Smoking status: Former    Packs/day: 2.00    Years: 20.00    Total  pack years: 40.00    Types: Cigarettes    Quit date: 10/09/2006    Years since quitting: 16.0   Smokeless tobacco: Never  Vaping Use   Vaping Use: Never used  Substance and Sexual Activity   Alcohol use: Yes    Alcohol/week: 1.0 standard drink of alcohol    Types:  1 Cans of beer per week    Comment: rarely   Drug use: No   Sexual activity: Yes    Birth control/protection: Surgical  Other Topics Concern   Not on file  Social History Narrative   12/16/19   From: [place]   Living: with wife Corey Bell, since 1990   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      Family: mother and brothers are nearby      Enjoys: record collection with 10,000 LPs - working on digitizing these, fixing up a 1968 mustang       Exercise: not currently   Diet: over-eating, tries to eat somewhat healthy - salad every other day      Safety   Seat belts: Yes    Guns: Yes  and secure   Safe in relationships: Yes    Social Determinants of Health   Financial Resource Strain: Not on file  Food Insecurity: No Food Insecurity (05/24/2021)   Hunger Vital Sign    Worried About Running Out of Food in the Last Year: Never true    [place] in the Last Year: Never true  Transportation Needs: No Transportation Needs (05/24/2021)   PRAPARE - Hydrologist (Medical): No    Lack of Transportation (Non-Medical): No  Physical Activity: Not on file  Stress: Not on file  Social Connections: Not on file  Intimate Partner Violence: Not on file     Constitutional: Patient reports fatigue.  Denies fever, malaise, headache or abrupt weight changes.  HEENT: Denies eye pain, eye redness, ear pain, ringing in the ears, wax buildup, runny nose, nasal congestion, bloody nose, or sore throat. Respiratory: Patient reports intermittent shortness of breath.  Denies difficulty breathing, cough or sputum production.   Cardiovascular: Denies chest pain, chest tightness, palpitations or swelling in the  hands or feet.  Gastrointestinal: Denies abdominal pain, bloating, constipation, diarrhea or blood in the stool.  GU: Denies urgency, frequency, pain with urination, burning sensation, blood in urine, odor or discharge. Musculoskeletal: Patient reports chronic back pain.  Denies decrease in range of motion, difficulty with gait, or joint swelling.  Skin: Denies redness, rashes, lesions or ulcercations.  Neurological: Patient reports neuropathic pain.  Denies dizziness, difficulty with memory, difficulty with speech or problems with balance and coordination.  Psych: Patient has a history of anxiety and depression.  Denies SI/HI.  No other specific complaints in a complete review of systems (except as listed in HPI above).  Objective:   Physical Exam  BP 134/70 (BP Location: Right Arm, Patient Position: Sitting, Cuff Size: Large)   Pulse 62   Temp (!) 96.8 F (36 C) (Temporal)   Wt 273 lb (123.8 kg)   SpO2 99%   BMI 35.05 kg/m   Wt Readings from Last 3 Encounters:  08/22/22 273 lb 9.6 oz (124.1 kg)  05/01/22 274 lb (124.3 kg)  03/31/22 274 lb (124.3 kg)    General: Appears his stated age, obese, in NAD. Skin: Warm, dry and intact. No ulcerations noted. HEENT: Head: normal shape and size; Eyes: sclera white, no icterus, conjunctiva pink, PERRLA and EOMs intact;  Neck:  Neck supple, trachea midline. No masses, lumps or thyromegaly present.  Cardiovascular: Normal rate and rhythm. S1,S2 noted.  No murmur, rubs or gallops noted. No JVD or BLE edema. No carotid bruits noted. Pulmonary/Chest: Normal effort and positive vesicular breath sounds. No respiratory distress. No wheezes, rales or ronchi noted.  Abdomen:  Normal bowel sounds.  Musculoskeletal: No difficulty with gait.  Neurological: Alert and oriented. Cranial nerves II-XII grossly intact. Coordination normal.  Psychiatric: Mood and affect normal. Behavior is normal. Judgment and thought content normal.     BMET    Component  Value Date/Time   NA 141 03/31/2022 1437   NA 142 01/27/2018 0000   NA 138 12/26/2012 1936   K 4.6 03/31/2022 1437   K 3.9 12/26/2012 1936   CL 104 03/31/2022 1437   CL 106 12/26/2012 1936   CO2 30 03/31/2022 1437   CO2 24 12/26/2012 1936   GLUCOSE 86 03/31/2022 1437   GLUCOSE 124 (H) 12/26/2012 1936   BUN 21 03/31/2022 1437   BUN 19 01/27/2018 0000   BUN 22 (H) 12/26/2012 1936   CREATININE 1.31 (H) 03/31/2022 1437   CALCIUM 9.5 03/31/2022 1437   CALCIUM 8.4 (L) 12/26/2012 1936   GFRNONAA >60 06/02/2020 0431   GFRNONAA 58 (L) 12/26/2012 1936   GFRAA >60 06/02/2020 0431   GFRAA >60 12/26/2012 1936    Lipid Panel     Component Value Date/Time   CHOL 169 03/31/2022 1437   TRIG 236 (H) 03/31/2022 1437   HDL 40 03/31/2022 1437   CHOLHDL 4.2 03/31/2022 1437   VLDL 29.8 06/10/2020 0919   LDLCALC 96 03/31/2022 1437    CBC    Component Value Date/Time   WBC 6.3 03/31/2022 1437   RBC 4.95 03/31/2022 1437   HGB 15.6 03/31/2022 1437   HGB 15.2 12/26/2012 1936   HCT 46.0 03/31/2022 1437   HCT 44.6 12/26/2012 1936   PLT 218 03/31/2022 1437   PLT 218 12/26/2012 1936   MCV 92.9 03/31/2022 1437   MCV 94 12/26/2012 1936   MCH 31.5 03/31/2022 1437   MCHC 33.9 03/31/2022 1437   RDW 12.5 03/31/2022 1437   RDW 13.3 12/26/2012 1936   LYMPHSABS 1.9 06/02/2020 0431   MONOABS 0.4 06/02/2020 0431   EOSABS 0.2 06/02/2020 0431   BASOSABS 0.1 06/02/2020 0431    Hgb A1C Lab Results  Component Value Date   HGBA1C 6.0 (H) 03/31/2022           Assessment & Plan:     RTC in 6 months for your annual exam Webb Silversmith, NP

## 2022-10-23 NOTE — Assessment & Plan Note (Signed)
Avoid foods that trigger reflux Encourage weight loss as this can reduce reflux symptoms Continue pantoprazole

## 2022-10-23 NOTE — Assessment & Plan Note (Signed)
Encourage weight loss as this can produce sleep apnea symptoms Continue CPAP use 

## 2022-10-23 NOTE — Assessment & Plan Note (Signed)
C-Met today 

## 2022-10-23 NOTE — Telephone Encounter (Signed)
  Chief Complaint: elevated BP Symptoms: elevated BP- readings ranging very high- to normal range Frequency: all week end Pertinent Negatives: Patient denies blurred vision, chest pain, headache, weakness Disposition: '[]'$ ED /'[]'$ Urgent Care (no appt availability in office) / '[x]'$ Appointment(In office/virtual)/ '[]'$  [place] [facility]/ '[]'$ Home Care/ '[]'$ Refused Recommended Disposition /'[]'$ [place] [facility]/ '[]'$  Follow-up with PCP Additional Notes:  Patient to bring home cuff with him

## 2022-10-23 NOTE — Assessment & Plan Note (Signed)
Continue citalopram, bupropion, hydroxyzine clonazepam  support offered

## 2022-10-23 NOTE — Assessment & Plan Note (Signed)
TSH and free T4 today We will adjust levothyroxine if needed based on labs 

## 2022-10-23 NOTE — Assessment & Plan Note (Signed)
Continue citalopram, bupropion, hydroxyzine and clonazepam Support offered

## 2022-10-23 NOTE — Assessment & Plan Note (Signed)
C-Met and lipid profile today

## 2022-10-23 NOTE — Assessment & Plan Note (Signed)
Encourage diet and exercise for weight loss 

## 2022-10-23 NOTE — Assessment & Plan Note (Signed)
POCT A1c 6.2% We will check urine microalbumin Encouraged him to consume a low-carb diet and exercise for weight loss Will request copy of eye exam He declines flu shot Pneumovax UTD Encouraged him to get his COVID booster

## 2022-10-23 NOTE — Assessment & Plan Note (Signed)
Continue gabapentin, methocarbamol, cyclobenzaprine and tramadol Encouraged regular stretching and core strengthening

## 2022-10-24 ENCOUNTER — Encounter: Payer: Self-pay | Admitting: Internal Medicine

## 2022-10-24 ENCOUNTER — Ambulatory Visit: Payer: Medicare HMO | Admitting: Pulmonary Disease

## 2022-10-24 ENCOUNTER — Ambulatory Visit: Payer: Medicare HMO | Admitting: Internal Medicine

## 2022-10-24 ENCOUNTER — Ambulatory Visit: Payer: Medicare HMO | Attending: Pulmonary Disease

## 2022-10-24 DIAGNOSIS — Z87891 Personal history of nicotine dependence: Secondary | ICD-10-CM | POA: Diagnosis not present

## 2022-10-24 DIAGNOSIS — R0602 Shortness of breath: Secondary | ICD-10-CM | POA: Insufficient documentation

## 2022-10-24 LAB — CBC
HCT: 46.8 % (ref 38.5–50.0)
Hemoglobin: 16.3 g/dL (ref 13.2–17.1)
MCH: 32 pg (ref 27.0–33.0)
MCHC: 34.8 g/dL (ref 32.0–36.0)
MCV: 91.9 fL (ref 80.0–100.0)
MPV: 9.4 fL (ref 7.5–12.5)
Platelets: 230 10*3/uL (ref 140–400)
RBC: 5.09 10*6/uL (ref 4.20–5.80)
RDW: 12.4 % (ref 11.0–15.0)
WBC: 5.5 10*3/uL (ref 3.8–10.8)

## 2022-10-24 LAB — PULMONARY FUNCTION TEST ARMC ONLY
DL/VA % pred: 92 %
DL/VA: 3.9 ml/min/mmHg/L
DLCO unc % pred: 98 %
DLCO unc: 31.5 ml/min/mmHg
FEF 25-75 Post: 4.6 L/sec
FEF 25-75 Pre: 3.46 L/sec
FEF2575-%Change-Post: 32 %
FEF2575-%Pred-Post: 129 %
FEF2575-%Pred-Pre: 97 %
FEV1-%Change-Post: 8 %
FEV1-%Pred-Post: 105 %
FEV1-%Pred-Pre: 97 %
FEV1-Post: 4.51 L
FEV1-Pre: 4.14 L
FEV1FVC-%Change-Post: 5 %
FEV1FVC-%Pred-Pre: 100 %
FEV6-%Change-Post: 3 %
FEV6-%Pred-Post: 104 %
FEV6-%Pred-Pre: 100 %
FEV6-Post: 5.6 L
FEV6-Pre: 5.39 L
FEV6FVC-%Change-Post: 0 %
FEV6FVC-%Pred-Post: 104 %
FEV6FVC-%Pred-Pre: 103 %
FVC-%Change-Post: 3 %
FVC-%Pred-Post: 100 %
FVC-%Pred-Pre: 97 %
FVC-Post: 5.6 L
FVC-Pre: 5.42 L
Post FEV1/FVC ratio: 80 %
Post FEV6/FVC ratio: 100 %
Pre FEV1/FVC ratio: 77 %
Pre FEV6/FVC Ratio: 100 %
RV % pred: 69 %
RV: 1.64 L
TLC % pred: 99 %
TLC: 7.72 L

## 2022-10-24 LAB — COMPLETE METABOLIC PANEL WITH GFR
AG Ratio: 1.6 (calc) (ref 1.0–2.5)
ALT: 42 U/L (ref 9–46)
AST: 28 U/L (ref 10–35)
Albumin: 4.4 g/dL (ref 3.6–5.1)
Alkaline phosphatase (APISO): 67 U/L (ref 35–144)
BUN: 14 mg/dL (ref 7–25)
CO2: 30 mmol/L (ref 20–32)
Calcium: 10 mg/dL (ref 8.6–10.3)
Chloride: 105 mmol/L (ref 98–110)
Creat: 1.16 mg/dL (ref 0.70–1.30)
Globulin: 2.8 g/dL (calc) (ref 1.9–3.7)
Glucose, Bld: 82 mg/dL (ref 65–99)
Potassium: 4.6 mmol/L (ref 3.5–5.3)
Sodium: 142 mmol/L (ref 135–146)
Total Bilirubin: 1 mg/dL (ref 0.2–1.2)
Total Protein: 7.2 g/dL (ref 6.1–8.1)
eGFR: 74 mL/min/{1.73_m2} (ref >=60)

## 2022-10-24 LAB — PSA: PSA: 0.35 ng/mL (ref <=4.00)

## 2022-10-24 LAB — LIPID PANEL
Cholesterol: 180 mg/dL (ref <200)
HDL: 37 mg/dL — ABNORMAL LOW (ref >=40)
LDL Cholesterol (Calc): 103 mg/dL (calc) — ABNORMAL HIGH
Non-HDL Cholesterol (Calc): 143 mg/dL (calc) — ABNORMAL HIGH (ref <130)
Total CHOL/HDL Ratio: 4.9 (calc) (ref <5.0)
Triglycerides: 281 mg/dL — ABNORMAL HIGH (ref <150)

## 2022-10-24 LAB — MICROALBUMIN / CREATININE URINE RATIO
Creatinine, Urine: 104 mg/dL (ref 20–320)
Microalb Creat Ratio: 14 mcg/mg creat (ref <30)
Microalb, Ur: 1.5 mg/dL

## 2022-10-24 LAB — TSH+FREE T4: TSH W/REFLEX TO FT4: 1 mIU/L (ref 0.40–4.50)

## 2022-10-24 MED ORDER — ALBUTEROL SULFATE (2.5 MG/3ML) 0.083% IN NEBU
2.5000 mg | INHALATION_SOLUTION | Freq: Once | RESPIRATORY_TRACT | Status: AC
  Administered 2022-10-24: 2.5 mg via RESPIRATORY_TRACT
  Filled 2022-10-24: qty 3
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
## 2022-10-30 ENCOUNTER — Encounter: Payer: Self-pay | Admitting: Cardiovascular Disease

## 2022-10-30 ENCOUNTER — Telehealth: Payer: Self-pay

## 2022-10-30 ENCOUNTER — Ambulatory Visit: Payer: Medicare HMO | Attending: Cardiovascular Disease | Admitting: Cardiovascular Disease

## 2022-10-30 VITALS — BP 130/64 | HR 59 | Ht 74.0 in | Wt 276.0 lb

## 2022-10-30 DIAGNOSIS — M791 Myalgia, unspecified site: Secondary | ICD-10-CM | POA: Diagnosis not present

## 2022-10-30 DIAGNOSIS — E782 Mixed hyperlipidemia: Secondary | ICD-10-CM

## 2022-10-30 DIAGNOSIS — E119 Type 2 diabetes mellitus without complications: Secondary | ICD-10-CM

## 2022-10-30 DIAGNOSIS — I10 Essential (primary) hypertension: Secondary | ICD-10-CM

## 2022-10-30 DIAGNOSIS — T466X5A Adverse effect of antihyperlipidemic and antiarteriosclerotic drugs, initial encounter: Secondary | ICD-10-CM | POA: Diagnosis not present

## 2022-10-30 DIAGNOSIS — G4733 Obstructive sleep apnea (adult) (pediatric): Secondary | ICD-10-CM | POA: Diagnosis not present

## 2022-10-30 DIAGNOSIS — I739 Peripheral vascular disease, unspecified: Secondary | ICD-10-CM

## 2022-10-30 DIAGNOSIS — E1122 Type 2 diabetes mellitus with diabetic chronic kidney disease: Secondary | ICD-10-CM | POA: Diagnosis not present

## 2022-10-30 DIAGNOSIS — N1831 Chronic kidney disease, stage 3a: Secondary | ICD-10-CM

## 2022-10-30 DIAGNOSIS — J449 Chronic obstructive pulmonary disease, unspecified: Secondary | ICD-10-CM | POA: Diagnosis not present

## 2022-10-30 MED ORDER — REPATHA SURECLICK 140 MG/ML ~~LOC~~ SOAJ: 140.0000 mg | SUBCUTANEOUS | 11 refills | Status: DC

## 2022-10-30 NOTE — Patient Instructions (Addendum)
Medication Instructions:  Repatha 140 sq every 2 weeks, coupon if available  If you need a refill on your cardiac medications before your next appointment, please call your pharmacy.   Lab work: No new labs needed  Testing/Procedures: No new testing needed  Follow-Up: At [facility], you and your health needs are our priority.  As part of our continuing mission to provide you with exceptional heart care, we have created designated Provider Care Teams.  These Care Teams include your primary Cardiologist (physician) and Advanced Practice Providers (APPs -  Physician Assistants and Nurse Practitioners) who all work together to provide you with the care you need, when you need it.  You will need a follow up appointment in 12 months  Providers on your designated Care Team:   Murray Hodgkins, NP Christell Faith, PA-C Cadence Kathlen Mody, [place]  COVID-19 Vaccine Information can be found at: [email or web address] For questions related to vaccine distribution or appointments, please email [email or web address] or call [phone].

## 2022-10-30 NOTE — Telephone Encounter (Signed)
Prior authorization initiated on [email or web address] for Repatha.  KEY: [id number]  Response: Approved

## 2022-10-31 ENCOUNTER — Encounter: Payer: Self-pay | Admitting: Internal Medicine

## 2022-10-31 MED ORDER — CYCLOBENZAPRINE HCL 10 MG PO TABS: ORAL_TABLET | ORAL | 1 refills | Status: DC

## 2022-11-02 ENCOUNTER — Ambulatory Visit: Payer: Medicare HMO | Admitting: Internal Medicine

## 2022-11-02 ENCOUNTER — Other Ambulatory Visit: Payer: Self-pay | Admitting: Internal Medicine

## 2022-11-02 ENCOUNTER — Ambulatory Visit: Payer: Medicare HMO | Admitting: Pulmonary Disease

## 2022-11-02 ENCOUNTER — Encounter: Payer: Self-pay | Admitting: Pulmonary Disease

## 2022-11-02 ENCOUNTER — Ambulatory Visit
Admission: RE | Admit: 2022-11-02 | Discharge: 2022-11-02 | Disposition: A | Discharge status: Home / Self Care | Payer: Medicare HMO | Source: Ambulatory Visit | Attending: Pulmonary Disease | Admitting: Pulmonary Disease

## 2022-11-02 VITALS — BP 124/70 | HR 66 | Temp 98.0°F | Ht 74.0 in | Wt 275.2 lb

## 2022-11-02 DIAGNOSIS — E6609 Other obesity due to excess calories: Secondary | ICD-10-CM | POA: Diagnosis not present

## 2022-11-02 DIAGNOSIS — G4733 Obstructive sleep apnea (adult) (pediatric): Secondary | ICD-10-CM | POA: Diagnosis not present

## 2022-11-02 DIAGNOSIS — J4520 Mild intermittent asthma, uncomplicated: Secondary | ICD-10-CM

## 2022-11-02 DIAGNOSIS — J449 Chronic obstructive pulmonary disease, unspecified: Secondary | ICD-10-CM | POA: Diagnosis not present

## 2022-11-02 DIAGNOSIS — Z6835 Body mass index (BMI) 35.0-35.9, adult: Secondary | ICD-10-CM

## 2022-11-02 DIAGNOSIS — R0789 Other chest pain: Secondary | ICD-10-CM | POA: Diagnosis not present

## 2022-11-02 DIAGNOSIS — R0602 Shortness of breath: Secondary | ICD-10-CM

## 2022-11-02 DIAGNOSIS — R0600 Dyspnea, unspecified: Secondary | ICD-10-CM | POA: Diagnosis not present

## 2022-11-02 MED ORDER — CYCLOBENZAPRINE HCL 10 MG PO TABS: 10.0000 mg | ORAL_TABLET | Freq: Every day | ORAL | 0 refills | Status: DC | PRN

## 2022-11-02 NOTE — Patient Instructions (Signed)
We will send the request for lung cancer screening evaluation though I believe you are just right outside the window.  Getting a chest x-ray today.  We will call you with the results when these are available.  We discussed the results of your breathing test.  You would benefit from weight loss and a conditioning program.  We will see you in follow-up in 4 to 6 months time call sooner should any new problems arise.

## 2022-11-02 NOTE — Telephone Encounter (Signed)
Requested Prescriptions  Pending Prescriptions Disp Refills   fluticasone (FLONASE) 50 MCG/ACT nasal spray [Pharmacy Med Name: FLUTICASONE PROPIONATE 50 MCG/ACT N] 16 g 2    Sig: PLACE 1 SPRAY INTO BOTH NOSTRILS 2 TIMESDAILY     Ear, Nose, and Throat: Nasal Preparations - Corticosteroids Passed - 11/02/2022  9:57 AM      Passed - Valid encounter within last 12 months    Recent Outpatient Visits           1 week ago Stage 3a chronic kidney disease [facility])   [facility] [place], Coralie Keens, NP   6 months ago Encounter for general adult medical examination with abnormal findings   [facility] [place], Coralie Keens, NP   7 months ago Chest congestion   [facility] [place], Coralie Keens, NP   1 year ago Screen for colon cancer   [facility] [place], Coralie Keens, NP   1 year ago Rectal mass   [facility] [place], Coralie Keens, NP       Future Appointments             In 5 months Baity, Coralie Keens, NP [facility], [facility]

## 2022-11-02 NOTE — Progress Notes (Signed)
Subjective:    Patient ID: Corey Bell, male    DOB: Nov 18, 1965, 57 y.o.   MRN: [id number] Patient Care Team: Jearld Fenton, NP as PCP - General (Internal Medicine)  Chief Complaint  Patient presents with   Follow-up    No current sx.    HPI Patient is a 57 year old former smoker (quit 2008, 40 PY) previously seen here on 22 August 2022 after a hiatus since 2021.  He states that he continues to have issues where he feels that he has chest pressure on the left as well as difficulty breathing only on that side.  He has not noted any shortness of breath on exertion or wheezing per se.  Just the sensation of chest tightness.  When he has the chest tightness he may have a mild sensation of shortness of breath which is transient. He does not note any aggravating nor mitigating circumstance. There is significant cardiac disease in the family.  Previously prescribed Breo and as needed albuterol however did not feel that these were helpful.  He has had recent cardiology visit but no further testing has been ordered.   He also has issues with obstructive sleep apnea and has been on CPAP at 15 cm of water pressure.  He uses the device 7 to 9 hours per night.  Has been compliant and notes improvement on sleep and alertness during the day with the device.  He recently had a new unit.  He continues to be compliant with that.  Patient inquires about lung cancer screening program, he however appears to be outside of the window as he quit smoking 16 years ago.  DATA 10/08/2020 PFTs: FEV1 4.31 L or 98% predicted, FVC 5.72 L or 100% predicted, FEV1/FVC 75%, minimal bronchodilator response, small airways component, diffusion capacity normal.  Very minimal obstruction with minimal bronchodilator response. 10/24/2022 PFTs: FEV1 4.14 L or 97% predicted, FVC 5.42 L or 97% predicted, FEV1/FVC 77%.  Postbronchodilator there is a significant response and reduction of decreased airway resistance and air trapping.   Overall study was normal with the impression that there is some mild airways reactivity.  Review of Systems A 10 point review of systems was performed and it is as noted above otherwise negative.  Patient Active Problem List   Diagnosis Date Noted   Stage 3a chronic kidney disease ([place]) 10/23/2022   Myalgia due to statin 05/01/2022   Chronic upper back pain 04/25/2021   Class 2 obesity due to excess calories with body mass index (BMI) of 35.0 to 35.9 in adult 04/25/2021   BPH (benign prostatic hyperplasia) 03/02/2020   Adjustment reaction with anxiety and depression 03/06/2019   Post-traumatic stress disorder, acute 01/27/2019   Diabetes mellitus type 2, diet-controlled ([place]) 07/31/2017   GERD (gastroesophageal reflux disease) 04/07/2015   HLD (hyperlipidemia) 06/03/2014   DDD (degenerative disc disease), cervical 10/13/2013   DDD (degenerative disc disease), lumbosacral 10/13/2013   OSA (obstructive sleep apnea) 07/01/2013   Essential hypertension 05/29/2013   Hypothyroidism    Social History   Tobacco Use   Smoking status: Former    Packs/day: 2.00    Years: 20.00    Total pack years: 40.00    Types: Cigarettes    Quit date: 10/09/2006    Years since quitting: 16.0   Smokeless tobacco: Never  Substance Use Topics   Alcohol use: Yes    Alcohol/week: 1.0 standard drink of alcohol    Types: 1 Cans of beer per week  Comment: rarely   Allergies  Allergen Reactions   Augmentin [Amoxicillin-Pot Clavulanate] Diarrhea   Demerol [Meperidine] Other (See Comments)    "goes crazy and has the strength of 10 men"   Eggs Or Egg-Derived Products Diarrhea and Nausea And Vomiting   Sulfamethoxazole Other (See Comments)    Blisters form in mouth   Statins Rash   Sulfa Antibiotics Rash   Sulfacetamide Sodium Rash   Immunization History  Administered Date(s) Administered   Moderna Sars-Covid-2 Vaccination 01/26/2020, 02/23/2020   Pneumococcal Polysaccharide-23 06/10/2020   Tdap  12/13/2017   Zoster Recombinat (Shingrix) 03/21/2018, 05/21/2018       Objective:   Physical Exam BP 124/70 (BP Location: Left Arm, Cuff Size: Large)   Pulse 66   Temp 98 F (36.7 C) (Temporal)   Ht '6\' 2"'$  (1.88 m)   Wt 275 lb 3.2 oz (124.8 kg)   SpO2 97%   BMI 35.33 kg/m   SpO2: 97 % O2 Device: None (Room air)  GENERAL: Obese gentleman, no acute distress, fully ambulatory.  No conversational dyspnea HEAD: Normocephalic, atraumatic.  EYES: Pupils equal, round, reactive to light.  No scleral icterus.  MOUTH: Intact dentition, no thrush.  Oral mucosa moist. NECK: Supple. No thyromegaly. Trachea midline. No JVD.  No adenopathy. PULMONARY: Good air entry bilaterally.  No adventitious sounds. CARDIOVASCULAR: S1 and S2. Regular rate and rhythm.  No rubs, murmurs or gallops heard.   ABDOMEN: Significant truncal obesity, otherwise benign. MUSCULOSKELETAL: No joint deformity, no clubbing, no edema.  NEUROLOGIC: No focal deficit, no gait disturbance, speech is fluent. SKIN: Intact,warm,dry. PSYCH: Mood and behavior normal.       Assessment & Plan:     ICD-10-CM   1. Left chest pressure  R07.89 DG Chest 2 View   Chest x-ray today Has been followed by cardiology No pulmonary etiology for this left chest pressure    2. Mild intermittent reactive airway disease without complication  Z61.09    Does not appear to affect patient Did not feel inhalers helped For the most part PFTs normal    3. OSA (obstructive sleep apnea)  G47.33    Continue CPAP at 15 cm H2O    4. Class 2 obesity due to excess calories without serious comorbidity with body mass index (BMI) of 35.0 to 35.9 in adult  E66.09    Z68.35    Recommend weight loss Patient can also benefit from conditioning program Suspect most of his symptoms due to obesity/deconditioning     Orders Placed This Encounter  Procedures   DG Chest 2 View    Standing Status:   Future    Number of Occurrences:   1    Standing  Expiration Date:   05/03/2023    Order Specific Question:   Reason for Exam (SYMPTOM  OR DIAGNOSIS REQUIRED)    Answer:   SOB    Order Specific Question:   Preferred imaging location?    Answer:   [facility]   Will send a request to the lung cancer screening team for evaluation of this patient's qualifications for the program I suspect he is outside of the window but having quit tobacco products 16+ years ago.  We will see the patient in follow-up in 4 to 6 months time he is to contact us prior to that time should any new difficulties arise.  Renold Don, MD Advanced Bronchoscopy PCCM [facility]    *This note was dictated using voice recognition software/Dragon.  Despite best efforts  to proofread, errors can occur which can change the meaning. Any transcriptional errors that result from this process are unintentional and may not be fully corrected at the time of dictation.

## 2022-11-21 ENCOUNTER — Encounter: Payer: Self-pay | Admitting: Internal Medicine

## 2022-11-21 MED ORDER — METHOCARBAMOL 500 MG PO TABS: 500.0000 mg | ORAL_TABLET | Freq: Three times a day (TID) | ORAL | 0 refills | Status: DC | PRN

## 2022-12-05 ENCOUNTER — Ambulatory Visit: Payer: Medicare HMO | Admitting: Podiatry

## 2022-12-05 DIAGNOSIS — M7751 Other enthesopathy of right foot: Secondary | ICD-10-CM | POA: Diagnosis not present

## 2022-12-05 DIAGNOSIS — M7671 Peroneal tendinitis, right leg: Secondary | ICD-10-CM

## 2022-12-05 NOTE — Progress Notes (Signed)
Subjective:  Patient ID: Corey Bell, male    DOB: [birth year],  MRN: [id number]  Chief Complaint  Patient presents with   Injections    56 y.o. male presents with the above complaint.  Patient presents for right peroneal tendinitis at the insertion.  Patient states is painful to touch is progressive gotten worse worse with ambulation worse with pressure.  He states is gotten worse over the last few months pain scale is 5 out of 10 sharp shooting in nature he would like to discuss options including steroid injection.   Review of Systems: Negative except as noted in the HPI. Denies N/V/F/Ch.  Past Medical History:  Diagnosis Date   Anxiety    Aphthous ulcer 10/04/2012   improving   Arthritis    left index fnger   Carpal tunnel syndrome, bilateral    damaged nerve in neck   Chronic pain syndrome    COPD (chronic obstructive pulmonary disease) (HCC)    mild   Depression    Diverticulitis of colon    Esophageal reflux    H/O acute prostatitis    Hypothyroidism    Insomnia, unspecified    Mixed hyperlipidemia    Peyronie disease 12/20/2012   PER uROLOGY   Seasonal allergies    Sleep apnea    uses cpap   Tympanic membrane perforation    history of    Current Outpatient Medications:    cyclobenzaprine (FLEXERIL) 10 MG tablet, Take 1 tablet (10 mg total) by mouth daily as needed for muscle spasms., Disp: 30 tablet, Rfl: 0   blood glucose meter kit and supplies, Dispense based on patient and insurance preference. Use up to four times daily as directed. (FOR ICD-10 E10.9, E11.9)., Disp: 1 each, Rfl: 0   buPROPion (WELLBUTRIN XL) 300 MG 24 hr tablet, Take 1 tablet (300 mg total) by mouth daily., Disp: 90 tablet, Rfl: 1   calcium carbonate (TUMS - DOSED IN MG ELEMENTAL CALCIUM) 500 MG chewable tablet, Chew 3 tablets by mouth daily., Disp: , Rfl:    citalopram (CELEXA) 40 MG tablet, Take 1 tablet (40 mg total) by mouth daily., Disp: 90 tablet, Rfl: 1   clonazePAM (KLONOPIN) 0.5  MG tablet, Take 1 tablet (0.5 mg total) by mouth daily as needed for anxiety., Disp: 20 tablet, Rfl: 0   diphenhydrAMINE (BENADRYL) 25 mg capsule, Take 50 mg by mouth at bedtime., Disp: , Rfl:    erythromycin with ethanol (THERAMYCIN) 2 % external solution, APPLY TOPICALLY TWICE A DAY AS NEEDED, Disp: 60 mL, Rfl: 0   Evolocumab (REPATHA SURECLICK) [id number] MG/ML SOAJ, Inject 140 mg into the skin every 14 (fourteen) days., Disp: 2 mL, Rfl: 11   ezetimibe (ZETIA) 10 MG tablet, Take 1 tablet (10 mg total) by mouth daily., Disp: 90 tablet, Rfl: 1   fexofenadine (ALLEGRA) 180 MG tablet, Take 180 mg by mouth daily., Disp: , Rfl:    fluticasone (FLONASE) 50 MCG/ACT nasal spray, PLACE 1 SPRAY INTO BOTH NOSTRILS 2 TIMESDAILY, Disp: 16 g, Rfl: 5   gabapentin (NEURONTIN) 800 MG tablet, Take 1 tablet (800 mg total) by mouth 2 (two) times daily., Disp: 180 tablet, Rfl: 0   hydrOXYzine (VISTARIL) 25 MG capsule, TAKE 1 CAPSULE BY MOUTH EVERY 8 HOURS ASNEEDED (Patient taking differently: Take 25 mg by mouth every 4 (four) hours as needed.), Disp: 90 capsule, Rfl: 0   levothyroxine (SYNTHROID) 75 MCG tablet, TAKE 1 TABLET BY MOUTH DAILY, Disp: 90 tablet, Rfl: 3   methocarbamol (ROBAXIN)  500 MG tablet, Take 1 tablet (500 mg total) by mouth every 8 (eight) hours as needed for muscle spasms., Disp: 270 tablet, Rfl: 0   montelukast (SINGULAIR) 10 MG tablet, Take 1 tablet (10 mg total) by mouth at bedtime., Disp: 90 tablet, Rfl: 1   pantoprazole (PROTONIX) 40 MG tablet, Take 1 tablet (40 mg total) by mouth daily., Disp: 90 tablet, Rfl: 1   Pseudoephedrine-guaiFENesin (MUCINEX D MAX STRENGTH PO), Take 1 tablet by mouth every 12 (twelve) hours., Disp: , Rfl:    sodium chloride (OCEAN) 0.65 % SOLN nasal spray, Place 1 spray into both nostrils as needed for congestion., Disp: 15 mL, Rfl: 0   tamsulosin (FLOMAX) 0.4 MG CAPS capsule, Take 2 capsules (0.8 mg total) by mouth at bedtime., Disp: 180 capsule, Rfl: 1   traMADol (ULTRAM)  50 MG tablet, Take 1 tablet (50 mg total) by mouth daily as needed., Disp: 30 tablet, Rfl: 0  Social History   Tobacco Use  Smoking Status Former   Packs/day: 2.00   Years: 20.00   Total pack years: 40.00   Types: Cigarettes   Quit date: 10/09/2006   Years since quitting: 16.1  Smokeless Tobacco Never    Allergies  Allergen Reactions   Augmentin [Amoxicillin-Pot Clavulanate] Diarrhea   Demerol [Meperidine] Other (See Comments)    "goes crazy and has the strength of 10 men"   Eggs Or Egg-Derived Products Diarrhea and Nausea And Vomiting   Sulfamethoxazole Other (See Comments)    Blisters form in mouth   Statins Rash   Sulfa Antibiotics Rash   Sulfacetamide Sodium Rash   Objective:  There were no vitals filed for this visit. There is no height or weight on file to calculate BMI. Constitutional Well developed. Well nourished.  Vascular Dorsalis pedis pulses palpable bilaterally. Posterior tibial pulses palpable bilaterally. Capillary refill normal to all digits.  No cyanosis or clubbing noted. Pedal hair growth normal.  Neurologic Normal speech. Oriented to person, place, and time. Epicritic sensation to light touch grossly present bilaterally.  Dermatologic Nails well groomed and normal in appearance. No open wounds. No skin lesions.  Orthopedic: Pain on palpation right lateral foot at along the course of the peroneal tendon primarily at the insertion of the peroneal tendon at the fifth metatarsal base.  Pain on palpation with resisted dorsiflexion eversion of the foot no pain with plantarflexion inversion of the foot   Radiographs: None Assessment:   1. Peroneal tendinitis, right   2. Bursitis of right foot    Plan:  Patient was evaluated and treated and all questions answered.  Right peroneal tendinitis with underlying bursitis -All questions and concerns were discussed with the patient in extensive detail given the amount of pain that he is having he will benefit  from steroid injection to help decrease acute inflammatory component associate with pain.  Patient agrees with plan like to proceed with steroid injection. -A steroid injection was performed at right lateral foot at point of maximal tenderness using 1% plain Lidocaine and 10 mg of Kenalog. This was well tolerated. -I discussed shoe gear modification with the patient in extensive detail he states understanding.  No follow-ups on file.

## 2023-01-02 ENCOUNTER — Encounter: Payer: Self-pay | Admitting: Internal Medicine

## 2023-01-04 ENCOUNTER — Encounter: Payer: Self-pay | Admitting: Internal Medicine

## 2023-01-05 ENCOUNTER — Encounter: Payer: Self-pay | Admitting: Internal Medicine

## 2023-01-05 ENCOUNTER — Ambulatory Visit (INDEPENDENT_AMBULATORY_CARE_PROVIDER_SITE_OTHER): Payer: Medicare HMO | Admitting: Internal Medicine

## 2023-01-05 VITALS — BP 134/72 | HR 73 | Temp 96.9°F | Wt 276.0 lb

## 2023-01-05 DIAGNOSIS — M542 Cervicalgia: Secondary | ICD-10-CM | POA: Diagnosis not present

## 2023-01-05 DIAGNOSIS — M549 Dorsalgia, unspecified: Secondary | ICD-10-CM

## 2023-01-05 DIAGNOSIS — M25511 Pain in right shoulder: Secondary | ICD-10-CM | POA: Diagnosis not present

## 2023-01-05 DIAGNOSIS — G8929 Other chronic pain: Secondary | ICD-10-CM

## 2023-01-05 MED ORDER — METHOCARBAMOL 500 MG PO TABS: 500.0000 mg | ORAL_TABLET | Freq: Three times a day (TID) | ORAL | 0 refills | Status: DC | PRN

## 2023-01-05 MED ORDER — PREGABALIN 150 MG PO CAPS: 150.0000 mg | ORAL_CAPSULE | Freq: Two times a day (BID) | ORAL | 0 refills | Status: DC

## 2023-01-05 NOTE — Patient Instructions (Signed)

## 2023-01-05 NOTE — Progress Notes (Unsigned)
Subjective:    Patient ID: Corey Bell, male    DOB: Feb 09, 1966, 57 y.o.   MRN: [id number]  HPI  Patient presents to clinic today with complaint of right side neck, shoulder and  upper back pain.  This has been a chronic issue for him for more than 10 years.  He describes the neck pain as tightness. The pain radiates into his right shoulder and shoulder blade. He denies numbness, tingling or weakness in the right upper extremity. He denies headaches or dizziness. He reports his left side of neck and left shoulder are not causing him any problems. He takes Cyclobenzaprine, Methocarbamol, Gabapentin and Tramadol but reports this does not provide significant relief. He reports Icy Hot seems to help the most. He has not tried massage or chiropractic care because of financial reasons. He has not had any updated imaging since 2008. He reports he had a radiofrequency ablation of the neck in 2005.  Review of Systems     Past Medical History:  Diagnosis Date   Anxiety    Aphthous ulcer 10/04/2012   improving   Arthritis    left index fnger   Carpal tunnel syndrome, bilateral    damaged nerve in neck   Chronic pain syndrome    COPD (chronic obstructive pulmonary disease) (HCC)    mild   Depression    Diverticulitis of colon    Esophageal reflux    H/O acute prostatitis    Hypothyroidism    Insomnia, unspecified    Mixed hyperlipidemia    Peyronie disease 12/20/2012   PER uROLOGY   Seasonal allergies    Sleep apnea    uses cpap   Tympanic membrane perforation    history of    Current Outpatient Medications  Medication Sig Dispense Refill   cyclobenzaprine (FLEXERIL) 10 MG tablet Take 1 tablet (10 mg total) by mouth daily as needed for muscle spasms. 30 tablet 0   blood glucose meter kit and supplies Dispense based on patient and insurance preference. Use up to four times daily as directed. (FOR ICD-10 E10.9, E11.9). 1 each 0   buPROPion (WELLBUTRIN XL) 300 MG 24 hr tablet Take 1  tablet (300 mg total) by mouth daily. 90 tablet 1   calcium carbonate (TUMS - DOSED IN MG ELEMENTAL CALCIUM) 500 MG chewable tablet Chew 3 tablets by mouth daily.     citalopram (CELEXA) 40 MG tablet Take 1 tablet (40 mg total) by mouth daily. 90 tablet 1   clonazePAM (KLONOPIN) 0.5 MG tablet Take 1 tablet (0.5 mg total) by mouth daily as needed for anxiety. 20 tablet 0   diphenhydrAMINE (BENADRYL) 25 mg capsule Take 50 mg by mouth at bedtime.     erythromycin with ethanol (THERAMYCIN) 2 % external solution APPLY TOPICALLY TWICE A DAY AS NEEDED 60 mL 0   Evolocumab (REPATHA SURECLICK) [id number] MG/ML SOAJ Inject 140 mg into the skin every 14 (fourteen) days. 2 mL 11   ezetimibe (ZETIA) 10 MG tablet Take 1 tablet (10 mg total) by mouth daily. 90 tablet 1   fexofenadine (ALLEGRA) 180 MG tablet Take 180 mg by mouth daily.     fluticasone (FLONASE) 50 MCG/ACT nasal spray PLACE 1 SPRAY INTO BOTH NOSTRILS 2 TIMESDAILY 16 g 5   gabapentin (NEURONTIN) 800 MG tablet Take 1 tablet (800 mg total) by mouth 2 (two) times daily. 180 tablet 0   hydrOXYzine (VISTARIL) 25 MG capsule TAKE 1 CAPSULE BY MOUTH EVERY 8 HOURS ASNEEDED (Patient taking  differently: Take 25 mg by mouth every 4 (four) hours as needed.) 90 capsule 0   levothyroxine (SYNTHROID) 75 MCG tablet TAKE 1 TABLET BY MOUTH DAILY 90 tablet 3   methocarbamol (ROBAXIN) 500 MG tablet Take 1 tablet (500 mg total) by mouth every 8 (eight) hours as needed for muscle spasms. 270 tablet 0   montelukast (SINGULAIR) 10 MG tablet Take 1 tablet (10 mg total) by mouth at bedtime. 90 tablet 1   pantoprazole (PROTONIX) 40 MG tablet Take 1 tablet (40 mg total) by mouth daily. 90 tablet 1   Pseudoephedrine-guaiFENesin (MUCINEX D MAX STRENGTH PO) Take 1 tablet by mouth every 12 (twelve) hours.     sodium chloride (OCEAN) 0.65 % SOLN nasal spray Place 1 spray into both nostrils as needed for congestion. 15 mL 0   tamsulosin (FLOMAX) 0.4 MG CAPS capsule Take 2 capsules (0.8 mg  total) by mouth at bedtime. 180 capsule 1   traMADol (ULTRAM) 50 MG tablet Take 1 tablet (50 mg total) by mouth daily as needed. 30 tablet 0   No current facility-administered medications for this visit.    Allergies  Allergen Reactions   Augmentin [Amoxicillin-Pot Clavulanate] Diarrhea   Demerol [Meperidine] Other (See Comments)    "goes crazy and has the strength of 10 men"   Egg-Derived Products Diarrhea and Nausea And Vomiting   Sulfamethoxazole Other (See Comments)    Blisters form in mouth   Statins Rash   Sulfa Antibiotics Rash   Sulfacetamide Sodium Rash    Family History  Problem Relation Age of Onset   Heart disease Mother    Arrhythmia Mother    Congestive Heart Failure Father    Heart disease Father    COPD Father    Heart disease Brother        stents at 35 yo   Lupus Brother    Bladder Cancer Paternal Uncle    Heart failure Paternal Grandmother    Kidney cancer Paternal Grandfather    Parkinsonism Neg Hx     Social History   Socioeconomic History   Marital status: Married    Spouse name: [name]   Number of children: 0   Years of education: college   Highest education level: Not on file  Occupational History   Occupation: disabled    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  Tobacco Use   Smoking status: Former    Packs/day: 2.00    Years: 20.00    Additional pack years: 0.00    Total pack years: 40.00    Types: Cigarettes    Quit date: 10/09/2006    Years since quitting: 16.2   Smokeless tobacco: Never  Vaping Use   Vaping Use: Never used  Substance and Sexual Activity   Alcohol use: Yes    Alcohol/week: 1.0 standard drink of alcohol    Types: 1 Cans of beer per week    Comment: rarely   Drug use: No   Sexual activity: Yes    Birth control/protection: Surgical  Other Topics Concern   Not on file  Social History Narrative   12/16/19   From: [place]   Living: with wife [name], since 1990   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       Family: mother and brothers are nearby      Enjoys: record collection with 10,000 LPs - working on digitizing these, fixing up a 1968 mustang       Exercise: not currently  Diet: over-eating, tries to eat somewhat healthy - salad every other day      Safety   Seat belts: Yes    Guns: Yes  and secure   Safe in relationships: Yes    Social Determinants of Health   Financial Resource Strain: Not on file  Food Insecurity: No Food Insecurity (05/24/2021)   Hunger Vital Sign    Worried About Running Out of Food in the Last Year: Never true    Ran Out of Food in the Last Year: Never true  Transportation Needs: No Transportation Needs (05/24/2021)   PRAPARE - Hydrologist (Medical): No    Lack of Transportation (Non-Medical): No  Physical Activity: Not on file  Stress: Not on file  Social Connections: Not on file  Intimate Partner Violence: Not on file     Constitutional: Denies fever, malaise, fatigue, headache or abrupt weight changes.  Respiratory: Denies difficulty breathing, shortness of breath, cough or sputum production.   Cardiovascular: Denies chest pain, chest tightness, palpitations or swelling in the hands or feet.  Musculoskeletal: Patient reports neck, shoulder and back pain.  Denies decrease in range of motion, difficulty with gait, or joint swelling.  Skin: Denies redness, rashes, lesions or ulcercations.  Neurological: Denies dizziness, difficulty with memory, difficulty with speech or problems with balance and coordination.  Psych: Denies anxiety, depression, SI/HI.  No other specific complaints in a complete review of systems (except as listed in HPI above).  Objective:   Physical Exam  There were no vitals taken for this visit. Wt Readings from Last 3 Encounters:  11/02/22 275 lb 3.2 oz (124.8 kg)  10/30/22 276 lb (125.2 kg)  10/23/22 273 lb (123.8 kg)    General: Appears their stated age, well developed, well nourished  in NAD. Skin: Warm, dry and intact. No rashes, lesions or ulcerations noted. HEENT: Head: normal shape and size; Eyes: sclera white, no icterus, conjunctiva pink, PERRLA and EOMs intact; Ears: Tm's gray and intact, normal light reflex; Nose: mucosa pink and moist, septum midline; Throat/Mouth: Teeth present, mucosa pink and moist, no exudate, lesions or ulcerations noted.  Neck:  Neck supple, trachea midline. No masses, lumps or thyromegaly present.  Cardiovascular: Normal rate and rhythm. S1,S2 noted.  No murmur, rubs or gallops noted. No JVD or BLE edema. No carotid bruits noted. Pulmonary/Chest: Normal effort and positive vesicular breath sounds. No respiratory distress. No wheezes, rales or ronchi noted.  Abdomen: Soft and nontender. Normal bowel sounds. No distention or masses noted. Liver, spleen and kidneys non palpable. Musculoskeletal: Normal range of motion. No signs of joint swelling. No difficulty with gait.  Neurological: Alert and oriented. Cranial nerves II-XII grossly intact. Coordination normal.  Psychiatric: Mood and affect normal. Behavior is normal. Judgment and thought content normal.     BMET    Component Value Date/Time   NA 142 10/23/2022 1413   NA 142 01/27/2018 0000   NA 138 12/26/2012 1936   K 4.6 10/23/2022 1413   K 3.9 12/26/2012 1936   CL 105 10/23/2022 1413   CL 106 12/26/2012 1936   CO2 30 10/23/2022 1413   CO2 24 12/26/2012 1936   GLUCOSE 82 10/23/2022 1413   GLUCOSE 124 (H) 12/26/2012 1936   BUN 14 10/23/2022 1413   BUN 19 01/27/2018 0000   BUN 22 (H) 12/26/2012 1936   CREATININE 1.16 10/23/2022 1413   CALCIUM 10.0 10/23/2022 1413   CALCIUM 8.4 (L) 12/26/2012 1936   GFRNONAA >60  06/02/2020 0431   GFRNONAA 58 (L) 12/26/2012 1936   GFRAA >60 06/02/2020 0431   GFRAA >60 12/26/2012 1936    Lipid Panel     Component Value Date/Time   CHOL 180 10/23/2022 1413   TRIG 281 (H) 10/23/2022 1413   HDL 37 (L) 10/23/2022 1413   CHOLHDL 4.9 10/23/2022  1413   VLDL 29.8 06/10/2020 0919   LDLCALC 103 (H) 10/23/2022 1413    CBC    Component Value Date/Time   WBC 5.5 10/23/2022 1413   RBC 5.09 10/23/2022 1413   HGB 16.3 10/23/2022 1413   HGB 15.2 12/26/2012 1936   HCT 46.8 10/23/2022 1413   HCT 44.6 12/26/2012 1936   PLT 230 10/23/2022 1413   PLT 218 12/26/2012 1936   MCV 91.9 10/23/2022 1413   MCV 94 12/26/2012 1936   MCH 32.0 10/23/2022 1413   MCHC 34.8 10/23/2022 1413   RDW 12.4 10/23/2022 1413   RDW 13.3 12/26/2012 1936   LYMPHSABS 1.9 06/02/2020 0431   MONOABS 0.4 06/02/2020 0431   EOSABS 0.2 06/02/2020 0431   BASOSABS 0.1 06/02/2020 0431    Hgb A1C Lab Results  Component Value Date   HGBA1C 6.2 (A) 10/23/2022            Assessment & Plan:     RTC in 4 months for annual exam Webb Silversmith, NP

## 2023-01-08 ENCOUNTER — Ambulatory Visit
Admission: RE | Admit: 2023-01-08 | Discharge: 2023-01-08 | Disposition: A | Discharge status: Home / Self Care | Payer: Medicare HMO | Attending: Internal Medicine | Admitting: Internal Medicine

## 2023-01-08 ENCOUNTER — Encounter: Payer: Self-pay | Admitting: Internal Medicine

## 2023-01-08 ENCOUNTER — Ambulatory Visit
Admission: RE | Admit: 2023-01-08 | Discharge: 2023-01-08 | Disposition: A | Discharge status: Home / Self Care | Payer: Medicare HMO | Source: Ambulatory Visit | Attending: Internal Medicine | Admitting: Internal Medicine

## 2023-01-08 DIAGNOSIS — M25511 Pain in right shoulder: Secondary | ICD-10-CM | POA: Insufficient documentation

## 2023-01-08 DIAGNOSIS — G8929 Other chronic pain: Secondary | ICD-10-CM | POA: Insufficient documentation

## 2023-01-08 DIAGNOSIS — M542 Cervicalgia: Secondary | ICD-10-CM | POA: Diagnosis not present

## 2023-02-03 ENCOUNTER — Other Ambulatory Visit: Payer: Self-pay | Admitting: Internal Medicine

## 2023-02-05 ENCOUNTER — Encounter: Payer: Self-pay | Admitting: Internal Medicine

## 2023-02-05 MED ORDER — TRAMADOL HCL 50 MG PO TABS: 50.0000 mg | ORAL_TABLET | Freq: Every day | ORAL | 0 refills | Status: DC | PRN

## 2023-02-05 NOTE — Telephone Encounter (Signed)
Requested medication (s) are due for refill today: yes  Requested medication (s) are on the active medication list: yes  Last refill:  01/05/23 #60/0  Future visit scheduled: yes  Notes to clinic:  Unable to refill per protocol, cannot delegate.    Requested Prescriptions  Pending Prescriptions Disp Refills   pregabalin (LYRICA) 150 MG capsule [Pharmacy Med Name: PREGABALIN 150 MG CAP] 60 capsule     Sig: TAKE ONE CAPSULE BY MOUTH TWICE A DAY     Not Delegated - Neurology:  Anticonvulsants - Controlled - pregabalin Failed - 02/03/2023 10:54 AM      Failed - This refill cannot be delegated      Passed - Cr in normal range and within 360 days    Creat  Date Value Ref Range Status  10/23/2022 1.16 0.70 - 1.30 mg/dL Final   Creatinine,U  Date Value Ref Range Status  09/07/2014 133.2 mg/dL Final   Creatinine, Urine  Date Value Ref Range Status  10/23/2022 104 20 - 320 mg/dL Final         Passed - Completed PHQ-2 or PHQ-9 in the last 360 days      Passed - Valid encounter within last 12 months    Recent Outpatient Visits           1 month ago Chronic right shoulder pain   [place] [facility] [place], [name], NP   3 months ago Stage 3a chronic kidney disease [facility])   [place] [facility] [place], [name], NP   9 months ago Encounter for general adult medical examination with abnormal findings   [place] [facility] [place], [name], NP   10 months ago Chest congestion   [facility] [place], [name], NP   1 year ago Screen for colon cancer   [place] [facility] [place], [name], NP       Future Appointments             In 2 months [place], [name], NP [place] [facility], [facility]

## 2023-02-20 ENCOUNTER — Encounter: Payer: Self-pay | Admitting: Internal Medicine

## 2023-02-21 MED ORDER — METHOCARBAMOL 500 MG PO TABS: 500.0000 mg | ORAL_TABLET | Freq: Three times a day (TID) | ORAL | 0 refills | Status: DC | PRN

## 2023-02-22 ENCOUNTER — Encounter: Payer: Self-pay | Admitting: Internal Medicine

## 2023-03-02 ENCOUNTER — Encounter (HOSPITAL_COMMUNITY): Payer: Self-pay | Admitting: *Deleted

## 2023-03-04 ENCOUNTER — Encounter: Payer: Self-pay | Admitting: Internal Medicine

## 2023-03-06 ENCOUNTER — Other Ambulatory Visit: Payer: Self-pay | Admitting: Internal Medicine

## 2023-03-06 MED ORDER — PREGABALIN 150 MG PO CAPS: 150.0000 mg | ORAL_CAPSULE | Freq: Two times a day (BID) | ORAL | 0 refills | Status: DC

## 2023-03-06 NOTE — Telephone Encounter (Signed)
Medication Refill - Medication: Generic Lyrica 150 mg 2 times a day  He is going out of town tomorrow.  Needs filled today  Has the patient contacted their pharmacy? No. (Agent: If no, request that the patient contact the pharmacy for the refill. If patient does not wish to contact the pharmacy document the reason why and proceed with request.) (Agent: If yes, when and what did the pharmacy advise?)  Preferred Pharmacy (with phone number or street name): [facility] Has the patient been seen for an appointment in the last year OR does the patient have an upcoming appointment? Yes.    Agent: Please be advised that RX refills may take up to 3 business days. We ask that you follow-up with your pharmacy.

## 2023-03-07 NOTE — Telephone Encounter (Signed)
Requested medication (s) are due for refill today - no  Requested medication (s) are on the active medication list -yes  Future visit scheduled -yes  Last refill: 03/06/23 #60  Notes to clinic: non delegated Rx- duplicate request  Requested Prescriptions  Pending Prescriptions Disp Refills   pregabalin (LYRICA) 150 MG capsule 60 capsule 0    Sig: Take 1 capsule (150 mg total) by mouth 2 (two) times daily.     Not Delegated - Neurology:  Anticonvulsants - Controlled - pregabalin Failed - 03/06/2023  2:30 PM      Failed - This refill cannot be delegated      Passed - Cr in normal range and within 360 days    Creat  Date Value Ref Range Status  10/23/2022 1.16 0.70 - 1.30 mg/dL Final   Creatinine,U  Date Value Ref Range Status  09/07/2014 133.2 mg/dL Final   Creatinine, Urine  Date Value Ref Range Status  10/23/2022 104 20 - 320 mg/dL Final         Passed - Completed PHQ-2 or PHQ-9 in the last 360 days      Passed - Valid encounter within last 12 months    Recent Outpatient Visits           2 months ago Chronic right shoulder pain   [place] [facility] [place], [name], NP   4 months ago Stage 3a chronic kidney disease [facility])   [place] [facility] [place], [name], NP   10 months ago Encounter for general adult medical examination with abnormal findings   [place] [facility] [place], [name], NP   11 months ago Chest congestion   [place] [facility] [place], [name], NP   1 year ago Screen for colon cancer   [place] [facility] [place], [name], NP       Future Appointments             In 1 month [place], [name], NP [place] [facility], [facility]               Requested Prescriptions  Pending Prescriptions Disp Refills   pregabalin (LYRICA) 150 MG capsule 60 capsule 0    Sig: Take 1 capsule (150 mg total) by mouth 2 (two) times daily.     Not  Delegated - Neurology:  Anticonvulsants - Controlled - pregabalin Failed - 03/06/2023  2:30 PM      Failed - This refill cannot be delegated      Passed - Cr in normal range and within 360 days    Creat  Date Value Ref Range Status  10/23/2022 1.16 0.70 - 1.30 mg/dL Final   Creatinine,U  Date Value Ref Range Status  09/07/2014 133.2 mg/dL Final   Creatinine, Urine  Date Value Ref Range Status  10/23/2022 104 20 - 320 mg/dL Final         Passed - Completed PHQ-2 or PHQ-9 in the last 360 days      Passed - Valid encounter within last 12 months    Recent Outpatient Visits           2 months ago Chronic right shoulder pain   [place] [facility] [place], [name], NP   4 months ago Stage 3a chronic kidney disease [facility])   [place] [facility] [place], [name], NP   10 months  ago Encounter for general adult medical examination with abnormal findings   [place] [facility] [place], [name], NP   11 months ago Chest congestion   [place] [facility] [place], [name], NP   1 year ago Screen for colon cancer   [place] [facility] [place], [name], NP       Future Appointments             In 1 month [place], [name], NP [place] [facility], [facility]

## 2023-03-19 ENCOUNTER — Encounter: Payer: Self-pay | Admitting: Internal Medicine

## 2023-03-20 ENCOUNTER — Ambulatory Visit (INDEPENDENT_AMBULATORY_CARE_PROVIDER_SITE_OTHER): Payer: Medicare HMO | Admitting: Internal Medicine

## 2023-03-20 ENCOUNTER — Encounter: Payer: Self-pay | Admitting: Internal Medicine

## 2023-03-20 VITALS — BP 126/80 | HR 60 | Temp 95.6°F | Wt 282.0 lb

## 2023-03-20 DIAGNOSIS — N5089 Other specified disorders of the male genital organs: Secondary | ICD-10-CM

## 2023-03-20 LAB — POCT URINALYSIS DIPSTICK
Bilirubin, UA: NEGATIVE
Blood, UA: NEGATIVE
Glucose, UA: NEGATIVE
Ketones, UA: NEGATIVE
Leukocytes, UA: NEGATIVE
Nitrite, UA: NEGATIVE
Protein, UA: NEGATIVE
Spec Grav, UA: 1.01 (ref 1.010–1.025)
Urobilinogen, UA: 0.2 E.U./dL
pH, UA: 6.5 (ref 5.0–8.0)

## 2023-03-20 NOTE — Patient Instructions (Signed)
Testicular Self-Exam A testicular self-exam means looking at and feeling your testicles for unusual lumps or swelling. Swelling, lumps, or pain can be caused by: Injuries. Irritation and swelling (inflammation). Infection. Extra fluids around the testicle (hydrocele). Twisted testicles (testicular torsion). Cancer of the testicle (testicular cancer). You may be at risk for this if you have: A testicle that has not descended. Previously had cancer of the testicle. A family history of cancer of the testicle. General tips It is easiest to do a self-exam during or after a warm bath or shower. Testicles are harder to examine when you are cold. A normal testicle is egg-shaped and feels firm. It is smooth, and it is not tender. It is normal to feel a firm cord that feels like spaghetti at the back of your testicles. This is called the spermatic cord. Do a self-exam once a month. How to do a self-exam of testicles  Stand and hold your penis away from your body. Look at each testicle to check for changes in how it looks. Look for swelling or changes in size or shape. Roll each testicle between your thumb and finger. Feel the whole testicle. Feel for: Lumps. Swelling. Discomfort. Check for swelling or tender bumps in the groin area. Your groin is where your lower belly (abdomen) meets your upper thighs. Contact a doctor if: You find a bump or lump. This may be a small, hard bump that is the size of a pea. You have swelling, pain, or soreness in your testicle area. You see or feel any other changes in your testicles. This information is not intended to replace advice given to you by your health care provider. Make sure you discuss any questions you have with your health care provider. Document Revised: 07/10/2022 Document Reviewed: 07/10/2022 Elsevier Patient Education  2024 [employer].

## 2023-03-20 NOTE — Progress Notes (Addendum)
Subjective:    Patient ID: Corey Bell, male    DOB: [birth year], 57 y.o.   MRN: [id number]  HPI  Patient presents to clinic today with complaint of a lump on his left testicle.  He noticed this about 5 days ago. The lump is slightly tender. He denies testicular swelling, redness. He denies urinary symptoms, penile discharge, difficulty ejaculating or blood in his ejaculate. He denies any injury to the area. He has had a vasectomy in the past. He has seen urology in the past but not recently.  Review of Systems     Past Medical History:  Diagnosis Date   Anxiety    Aphthous ulcer 10/04/2012   improving   Arthritis    left index fnger   Carpal tunnel syndrome, bilateral    damaged nerve in neck   Chronic pain syndrome    COPD (chronic obstructive pulmonary disease) (HCC)    mild   Depression    Diverticulitis of colon    Esophageal reflux    H/O acute prostatitis    Hypothyroidism    Insomnia, unspecified    Mixed hyperlipidemia    Peyronie disease 12/20/2012   PER uROLOGY   Seasonal allergies    Sleep apnea    uses cpap   Tympanic membrane perforation    history of    Current Outpatient Medications  Medication Sig Dispense Refill   blood glucose meter kit and supplies Dispense based on patient and insurance preference. Use up to four times daily as directed. (FOR ICD-10 E10.9, E11.9). 1 each 0   buPROPion (WELLBUTRIN XL) 300 MG 24 hr tablet Take 1 tablet (300 mg total) by mouth daily. 90 tablet 1   calcium carbonate (TUMS - DOSED IN MG ELEMENTAL CALCIUM) 500 MG chewable tablet Chew 3 tablets by mouth daily.     citalopram (CELEXA) 40 MG tablet Take 1 tablet (40 mg total) by mouth daily. 90 tablet 1   clonazePAM (KLONOPIN) 0.5 MG tablet Take 1 tablet (0.5 mg total) by mouth daily as needed for anxiety. 20 tablet 0   cyclobenzaprine (FLEXERIL) 10 MG tablet Take 1 tablet (10 mg total) by mouth daily as needed for muscle spasms. 30 tablet 0   diphenhydrAMINE (BENADRYL)  25 mg capsule Take 50 mg by mouth at bedtime.     erythromycin with ethanol (THERAMYCIN) 2 % external solution APPLY TOPICALLY TWICE A DAY AS NEEDED 60 mL 0   Evolocumab (REPATHA SURECLICK) 140 MG/ML SOAJ Inject 140 mg into the skin every 14 (fourteen) days. 2 mL 11   ezetimibe (ZETIA) 10 MG tablet Take 1 tablet (10 mg total) by mouth daily. 90 tablet 1   fexofenadine (ALLEGRA) 180 MG tablet Take 180 mg by mouth daily.     fluticasone (FLONASE) 50 MCG/ACT nasal spray PLACE 1 SPRAY INTO BOTH NOSTRILS 2 TIMESDAILY 16 g 5   hydrOXYzine (VISTARIL) 25 MG capsule TAKE 1 CAPSULE BY MOUTH EVERY 8 HOURS ASNEEDED (Patient taking differently: Take 25 mg by mouth every 4 (four) hours as needed.) 90 capsule 0   levothyroxine (SYNTHROID) 75 MCG tablet TAKE 1 TABLET BY MOUTH DAILY 90 tablet 3   methocarbamol (ROBAXIN) 500 MG tablet Take 1 tablet (500 mg total) by mouth every 8 (eight) hours as needed for muscle spasms. 270 tablet 0   montelukast (SINGULAIR) 10 MG tablet Take 1 tablet (10 mg total) by mouth at bedtime. 90 tablet 1   pantoprazole (PROTONIX) 40 MG tablet Take 1 tablet (40 mg  total) by mouth daily. 90 tablet 1   pregabalin (LYRICA) 150 MG capsule Take 1 capsule (150 mg total) by mouth 2 (two) times daily. 60 capsule 0   Pseudoephedrine-guaiFENesin (MUCINEX D MAX STRENGTH PO) Take 1 tablet by mouth every 12 (twelve) hours.     sodium chloride (OCEAN) 0.65 % SOLN nasal spray Place 1 spray into both nostrils as needed for congestion. 15 mL 0   tamsulosin (FLOMAX) 0.4 MG CAPS capsule Take 2 capsules (0.8 mg total) by mouth at bedtime. 180 capsule 1   traMADol (ULTRAM) 50 MG tablet Take 1 tablet (50 mg total) by mouth daily as needed. 30 tablet 0   No current facility-administered medications for this visit.    Allergies  Allergen Reactions   Augmentin [Amoxicillin-Pot Clavulanate] Diarrhea   Demerol [Meperidine] Other (See Comments)    "goes crazy and has the strength of 10 men"   Egg-Derived  Products Diarrhea and Nausea And Vomiting   Sulfamethoxazole Other (See Comments)    Blisters form in mouth   Statins Rash   Sulfa Antibiotics Rash   Sulfacetamide Sodium Rash    Family History  Problem Relation Age of Onset   Heart disease Mother    Arrhythmia Mother    Congestive Heart Failure Father    Heart disease Father    COPD Father    Heart disease Brother        stents at 55 yo   Lupus Brother    Bladder Cancer Paternal Uncle    Heart failure Paternal Grandmother    Kidney cancer Paternal Grandfather    Parkinsonism Neg Hx     Social History   Socioeconomic History   Marital status: Married    Spouse name: [name]   Number of children: 0   Years of education: college   Highest education level: Associate degree: occupational, Scientist, product/process development, or vocational program  Occupational History   Occupation: disabled    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  Tobacco Use   Smoking status: Former    Packs/day: 2.00    Years: 20.00    Additional pack years: 0.00    Total pack years: 40.00    Types: Cigarettes    Quit date: 10/09/2006    Years since quitting: 16.4   Smokeless tobacco: Never  Vaping Use   Vaping Use: Never used  Substance and Sexual Activity   Alcohol use: Yes    Alcohol/week: 1.0 standard drink of alcohol    Types: 1 Cans of beer per week    Comment: rarely   Drug use: No   Sexual activity: Yes    Birth control/protection: Surgical  Other Topics Concern   Not on file  Social History Narrative   12/16/19   From: [place]   Living: with wife [name], since 1990   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      Family: mother and brothers are nearby      Enjoys: record collection with 10,000 LPs - working on digitizing these, fixing up a 1968 mustang       Exercise: not currently   Diet: over-eating, tries to eat somewhat healthy - salad every other day      Safety   Seat belts: Yes    Guns: Yes  and secure   Safe in relationships: Yes     Social Determinants of Health   Financial Resource Strain: Low Risk  (01/05/2023)   Overall Financial Resource Strain (CARDIA)  Difficulty of Paying Living Expenses: Not hard at all  Food Insecurity: No Food Insecurity (01/05/2023)   Hunger Vital Sign    Worried About Running Out of Food in the Last Year: Never true    Ran Out of Food in the Last Year: Never true  Transportation Needs: No Transportation Needs (01/05/2023)   PRAPARE - Administrator, Civil Service (Medical): No    Lack of Transportation (Non-Medical): No  Physical Activity: Not on file  Stress: Not on file  Social Connections: Unknown (01/05/2023)   Social Connection and Isolation Panel [NHANES]    Frequency of Communication with Friends and Family: More than three times a week    Frequency of Social Gatherings with Friends and Family: Once a week    Attends Religious Services: Patient declined    Database administrator or Organizations: Patient declined    Attends Engineer, structural: Not on file    Marital Status: Married  Catering manager Violence: Not on file     Constitutional: Denies fever, malaise, fatigue, headache or abrupt weight changes.  Respiratory: Denies difficulty breathing, shortness of breath, cough or sputum production.   Cardiovascular: Denies chest pain, chest tightness, palpitations or swelling in the hands or feet.  Gastrointestinal: Denies abdominal pain, bloating, constipation, diarrhea or blood in the stool.  GU: Pt reports mass of left testicle. Denies urgency, frequency, pain with urination, burning sensation, blood in urine, odor or discharge.  No other specific complaints in a complete review of systems (except as listed in HPI above).  Objective:   Physical Exam BP 126/80 (BP Location: Left Arm, Patient Position: Sitting, Cuff Size: Large)   Pulse 60   Temp (!) 95.6 F (35.3 C) (Temporal)   Wt 282 lb (127.9 kg)   SpO2 98%   BMI 36.21 kg/m   Wt Readings  from Last 3 Encounters:  01/05/23 276 lb (125.2 kg)  11/02/22 275 lb 3.2 oz (124.8 kg)  10/30/22 276 lb (125.2 kg)    General: Appears his stated age, obese,in NAD. Cardiovascular: Normal rate and rhythm.  Pulmonary/Chest: Normal effort and positive vesicular breath sounds. No respiratory distress. No wheezes, rales or ronchi noted.  GU: Normal male anatomy. 1 cm mass noted within the scrotum on the left side.  Neurological: Alert and oriented.   BMET    Component Value Date/Time   NA 142 10/23/2022 1413   NA 142 01/27/2018 0000   NA 138 12/26/2012 1936   K 4.6 10/23/2022 1413   K 3.9 12/26/2012 1936   CL 105 10/23/2022 1413   CL 106 12/26/2012 1936   CO2 30 10/23/2022 1413   CO2 24 12/26/2012 1936   GLUCOSE 82 10/23/2022 1413   GLUCOSE 124 (H) 12/26/2012 1936   BUN 14 10/23/2022 1413   BUN 19 01/27/2018 0000   BUN 22 (H) 12/26/2012 1936   CREATININE 1.16 10/23/2022 1413   CALCIUM 10.0 10/23/2022 1413   CALCIUM 8.4 (L) 12/26/2012 1936   GFRNONAA >60 06/02/2020 0431   GFRNONAA 58 (L) 12/26/2012 1936   GFRAA >60 06/02/2020 0431   GFRAA >60 12/26/2012 1936    Lipid Panel     Component Value Date/Time   CHOL 180 10/23/2022 1413   TRIG 281 (H) 10/23/2022 1413   HDL 37 (L) 10/23/2022 1413   CHOLHDL 4.9 10/23/2022 1413   VLDL 29.8 06/10/2020 0919   LDLCALC 103 (H) 10/23/2022 1413    CBC    Component Value Date/Time  WBC 5.5 10/23/2022 1413   RBC 5.09 10/23/2022 1413   HGB 16.3 10/23/2022 1413   HGB 15.2 12/26/2012 1936   HCT 46.8 10/23/2022 1413   HCT 44.6 12/26/2012 1936   PLT 230 10/23/2022 1413   PLT 218 12/26/2012 1936   MCV 91.9 10/23/2022 1413   MCV 94 12/26/2012 1936   MCH 32.0 10/23/2022 1413   MCHC 34.8 10/23/2022 1413   RDW 12.4 10/23/2022 1413   RDW 13.3 12/26/2012 1936   LYMPHSABS 1.9 06/02/2020 0431   MONOABS 0.4 06/02/2020 0431   EOSABS 0.2 06/02/2020 0431   BASOSABS 0.1 06/02/2020 0431    Hgb A1C Lab Results  Component Value Date    HGBA1C 6.2 (A) 10/23/2022            Assessment & Plan:   Mass of Left Testicle:  Urinalysis: normal Will obtain US scrotum with doppler for further evaluation of symptoms Keep scheduled appt with urology   RTC in 1 month for your annual exam Nicki Reaper, NP

## 2023-03-21 ENCOUNTER — Telehealth: Payer: Self-pay

## 2023-03-21 ENCOUNTER — Encounter: Payer: Self-pay | Admitting: Internal Medicine

## 2023-03-21 NOTE — Telephone Encounter (Signed)
Copied from CRM [id number]. Topic: Referral - Status >> Mar 21, 2023  1:42 PM Franchot Heidelberg wrote: Reason for CRM: Pt called requesting to speak to the referrals coordinator. Please advise   Best contact: [phone]

## 2023-03-22 ENCOUNTER — Encounter: Payer: Self-pay | Admitting: Internal Medicine

## 2023-03-23 ENCOUNTER — Ambulatory Visit: Payer: Medicare HMO

## 2023-03-26 ENCOUNTER — Ambulatory Visit
Admission: RE | Admit: 2023-03-26 | Discharge: 2023-03-26 | Disposition: A | Discharge status: Home / Self Care | Payer: Medicare HMO | Source: Ambulatory Visit | Attending: Internal Medicine | Admitting: Internal Medicine

## 2023-03-26 DIAGNOSIS — N503 Cyst of epididymis: Secondary | ICD-10-CM | POA: Diagnosis not present

## 2023-03-26 DIAGNOSIS — I861 Scrotal varices: Secondary | ICD-10-CM | POA: Diagnosis not present

## 2023-03-26 DIAGNOSIS — N5089 Other specified disorders of the male genital organs: Secondary | ICD-10-CM | POA: Insufficient documentation

## 2023-03-29 ENCOUNTER — Other Ambulatory Visit: Payer: Self-pay | Admitting: Internal Medicine

## 2023-03-29 NOTE — Telephone Encounter (Signed)
Requested medication (s) are due for refill today: yes  Requested medication (s) are on the active medication list: yes    Last refill: 07/21/21  #20  0 refills  Future visit scheduled Yes 04/23/23  Notes to clinic:Not delegated, please review.  Requested Prescriptions  Pending Prescriptions Disp Refills   clonazePAM (KLONOPIN) 0.5 MG tablet [Pharmacy Med Name: CLONAZEPAM 0.5 MG TAB] 20 tablet     Sig: TAKE 1 TABLET BY MOUTH DAILY AS NEEDED FOR ANXIETY     Not Delegated - Psychiatry: Anxiolytics/Hypnotics 2 Failed - 03/29/2023  3:15 PM      Failed - This refill cannot be delegated      Failed - Urine Drug Screen completed in last 360 days      Passed - Patient is not pregnant      Passed - Valid encounter within last 6 months    Recent Outpatient Visits           1 week ago Mass of left testicle   [place] [facility] [place], [name], NP   2 months ago Chronic right shoulder pain   [place] [facility] [place], [name], NP   5 months ago Stage 3a chronic kidney disease [facility])   [place] [facility] [place], [name], NP   11 months ago Encounter for general adult medical examination with abnormal findings   [place] [facility] [place], [name], NP   12 months ago Chest congestion   [place] [facility] [place], [name], NP       Future Appointments             In 3 weeks [name], [name], NP  [facility], [facility]

## 2023-04-04 ENCOUNTER — Encounter: Payer: Self-pay | Admitting: Internal Medicine

## 2023-04-05 MED ORDER — METHOCARBAMOL 500 MG PO TABS: 500.0000 mg | ORAL_TABLET | Freq: Three times a day (TID) | ORAL | 0 refills | Status: DC | PRN

## 2023-04-09 ENCOUNTER — Encounter: Payer: Self-pay | Admitting: Internal Medicine

## 2023-04-11 ENCOUNTER — Encounter: Payer: Self-pay | Admitting: Internal Medicine

## 2023-04-11 MED ORDER — PREGABALIN 150 MG PO CAPS: 150.0000 mg | ORAL_CAPSULE | Freq: Two times a day (BID) | ORAL | 0 refills | Status: DC

## 2023-04-17 DIAGNOSIS — R3911 Hesitancy of micturition: Secondary | ICD-10-CM | POA: Diagnosis not present

## 2023-04-17 DIAGNOSIS — Z125 Encounter for screening for malignant neoplasm of prostate: Secondary | ICD-10-CM | POA: Diagnosis not present

## 2023-04-17 DIAGNOSIS — I861 Scrotal varices: Secondary | ICD-10-CM | POA: Diagnosis not present

## 2023-04-20 ENCOUNTER — Encounter: Payer: Self-pay | Admitting: Internal Medicine

## 2023-04-23 ENCOUNTER — Encounter: Payer: Medicare HMO | Admitting: Internal Medicine

## 2023-04-26 ENCOUNTER — Other Ambulatory Visit: Payer: Self-pay | Admitting: Internal Medicine

## 2023-04-27 NOTE — Telephone Encounter (Signed)
Requested Prescriptions  Pending Prescriptions Disp Refills   ezetimibe (ZETIA) 10 MG tablet [Pharmacy Med Name: EZETIMIBE 10 MG TAB] 90 tablet 0    Sig: TAKE 1 TABLET BY MOUTH DAILY     Cardiovascular:  Antilipid - Sterol Transport Inhibitors Failed - 04/26/2023  4:27 PM      Failed - Lipid Panel in normal range within the last 12 months    Cholesterol  Date Value Ref Range Status  10/23/2022 180 <200 mg/dL Final   LDL Cholesterol (Calc)  Date Value Ref Range Status  10/23/2022 103 (H) mg/dL (calc) Final    Comment:    Reference range: <100 . Desirable range <100 mg/dL for primary prevention;   <70 mg/dL for patients with CHD or diabetic patients  with > or = 2 CHD risk factors. Marland Kitchen LDL-C is now calculated using the Martin-Hopkins  calculation, which is a validated novel method providing  better accuracy than the Friedewald equation in the  estimation of LDL-C.  Horald Pollen et al. Lenox Ahr. 9147;829(56): 2061-2068  (http://education.[employer].com/faq/FAQ164)    Direct LDL  Date Value Ref Range Status  12/16/2019 135.0 mg/dL Final    Comment:    Optimal:  <100 mg/dLNear or Above Optimal:  100-129 mg/dLBorderline High:  130-159 mg/dLHigh:  160-189 mg/dLVery High:  >190 mg/dL   HDL  Date Value Ref Range Status  10/23/2022 37 (L) > OR = 40 mg/dL Final   Triglycerides  Date Value Ref Range Status  10/23/2022 281 (H) <150 mg/dL Final    Comment:    . If a non-fasting specimen was collected, consider repeat triglyceride testing on a fasting specimen if clinically indicated.  Perry Mount et al. J. of Clin. Lipidol. 2015;9:129-169. Marland Kitchen          Passed - AST in normal range and within 360 days    AST  Date Value Ref Range Status  10/23/2022 28 10 - 35 U/L Final   SGOT(AST)  Date Value Ref Range Status  12/18/2012 47 (H) 15 - 37 Unit/L Final         Passed - ALT in normal range and within 360 days    ALT  Date Value Ref Range Status  10/23/2022 42 9 - 46 U/L Final    SGPT (ALT)  Date Value Ref Range Status  12/18/2012 66 12 - 78 U/L Final         Passed - Patient is not pregnant      Passed - Valid encounter within last 12 months    Recent Outpatient Visits           1 month ago Mass of left testicle   [place] [facility] [place], [name], NP   3 months ago Chronic right shoulder pain   [place] [facility] [place], [name], NP   6 months ago Stage 3a chronic kidney disease [facility])   [place] [facility] [place], [name], NP   12 months ago Encounter for general adult medical examination with abnormal findings   [place] [facility] [place], [name], NP   1 year ago Chest congestion    [facility] [place], [name], NP       Future Appointments             In 3 weeks [name], [name], NP  [facility], [facility]

## 2023-04-30 ENCOUNTER — Ambulatory Visit (INDEPENDENT_AMBULATORY_CARE_PROVIDER_SITE_OTHER): Payer: Medicare HMO | Admitting: Internal Medicine

## 2023-04-30 ENCOUNTER — Encounter: Payer: Self-pay | Admitting: Internal Medicine

## 2023-04-30 VITALS — BP 139/79 | HR 58 | Ht 74.0 in | Wt 284.0 lb

## 2023-04-30 DIAGNOSIS — R5383 Other fatigue: Secondary | ICD-10-CM

## 2023-04-30 DIAGNOSIS — F4323 Adjustment disorder with mixed anxiety and depressed mood: Secondary | ICD-10-CM | POA: Diagnosis not present

## 2023-04-30 DIAGNOSIS — E559 Vitamin D deficiency, unspecified: Secondary | ICD-10-CM

## 2023-04-30 DIAGNOSIS — E539 Vitamin B deficiency, unspecified: Secondary | ICD-10-CM

## 2023-04-30 DIAGNOSIS — R3911 Hesitancy of micturition: Secondary | ICD-10-CM

## 2023-04-30 DIAGNOSIS — E038 Other specified hypothyroidism: Secondary | ICD-10-CM

## 2023-04-30 DIAGNOSIS — G4733 Obstructive sleep apnea (adult) (pediatric): Secondary | ICD-10-CM | POA: Diagnosis not present

## 2023-04-30 MED ORDER — TAMSULOSIN HCL 0.4 MG PO CAPS
0.8000 mg | ORAL_CAPSULE | Freq: Every day | ORAL | 1 refills | Status: DC
Start: 2023-04-30 — End: 2023-11-27

## 2023-04-30 NOTE — Patient Instructions (Signed)
Fatigue If you have fatigue, you feel tired all the time and have a lack of energy or a lack of motivation. Fatigue may make it difficult to start or complete tasks because of exhaustion. Occasional or mild fatigue is often a normal response to activity or life. However, long-term (chronic) or extreme fatigue may be a symptom of a medical condition such as: Depression. Not having enough red blood cells or hemoglobin in the blood (anemia). A problem with a small gland located in the lower front part of the neck (thyroid disorder). Rheumatologic conditions. These are problems related to the body's defense system (immune system). Infections, especially certain viral infections. Fatigue can also lead to negative health outcomes over time. Follow these instructions at home: Medicines Take over-the-counter and prescription medicines only as told by your health care provider. Take a multivitamin if told by your health care provider. Do not use herbal or dietary supplements unless they are approved by your health care provider. Eating and drinking  Avoid heavy meals in the evening. Eat a well-balanced diet, which includes lean proteins, whole grains, plenty of fruits and vegetables, and low-fat dairy products. Avoid eating or drinking too many products with caffeine in them. Avoid alcohol. Drink enough fluid to keep your urine pale yellow. Activity  Exercise regularly, as told by your health care provider. Use or practice techniques to help you relax, such as yoga, tai chi, meditation, or massage therapy. Lifestyle Change situations that cause you stress. Try to keep your work and personal schedules in balance. Do not use recreational or illegal drugs. General instructions Monitor your fatigue for any changes. Go to bed and get up at the same time every day. Avoid fatigue by pacing yourself during the day and getting enough sleep at night. Maintain a healthy weight. Contact a health care  provider if: Your fatigue does not get better. You have a fever. You suddenly lose or gain weight. You have headaches. You have trouble falling asleep or sleeping through the night. You feel angry, guilty, anxious, or sad. You have swelling in your legs or another part of your body. Get help right away if: You feel confused, feel like you might faint, or faint. Your vision is blurry or you have a severe headache. You have severe pain in your abdomen, your back, or the area between your waist and hips (pelvis). You have chest pain, shortness of breath, or an irregular or fast heartbeat. You are unable to urinate, or you urinate less than normal. You have abnormal bleeding from the rectum, nose, lungs, nipples, or, if you are male, the vagina. You vomit blood. You have thoughts about hurting yourself or others. These symptoms may be an emergency. Get help right away. Call 911. Do not wait to see if the symptoms will go away. Do not drive yourself to the hospital. Get help right away if you feel like you may hurt yourself or others, or have thoughts about taking your own life. Go to your nearest emergency room or: Call 911. Call the National Suicide Prevention Lifeline at 1-800-273-8255 or 988. This is open 24 hours a day. Text the Crisis Text Line at 741741. Summary If you have fatigue, you feel tired all the time and have a lack of energy or a lack of motivation. Fatigue may make it difficult to start or complete tasks because of exhaustion. Long-term (chronic) or extreme fatigue may be a symptom of a medical condition. Exercise regularly, as told by your health care provider.   Change situations that cause you stress. Try to keep your work and personal schedules in balance. This information is not intended to replace advice given to you by your health care provider. Make sure you discuss any questions you have with your health care provider. Document Revised: 07/18/2021 Document  Reviewed: 07/18/2021 Elsevier Patient Education  2024 [employer]  

## 2023-04-30 NOTE — Progress Notes (Signed)
Subjective:    Patient ID: Corey Bell, male    DOB: 09-13-1966, 57 y.o.   MRN: [id number]  HPI  Patient presents to clinic today with complaint of fatigue. He noticed this a few weeks ago. He has a history of OSA, hypothyroidism, anxiety and depression. He  reports he is not sleeping well.  He has difficulty falling asleep and staying asleep at night.  He does sometimes nap during the day.  He has been sleeping 7-8 hours at night with the use of his CPAP. He is noticing on his CPAP chart that he is having 6-8 apnea events per night. He thinks his mask is fitting well but his report he is having problems with his seal.  He is not currently exercising.  He does feel like his anxiety and depression is well-controlled with bupropion, citalopram, clonazepam and hydroxyzine.  He is not currently seeing a therapist.  Review of Systems     Past Medical History:  Diagnosis Date   Anxiety    Aphthous ulcer 10/04/2012   improving   Arthritis    left index fnger   Carpal tunnel syndrome, bilateral    damaged nerve in neck   Chronic pain syndrome    COPD (chronic obstructive pulmonary disease) (HCC)    mild   Depression    Diverticulitis of colon    Esophageal reflux    H/O acute prostatitis    Hypothyroidism    Insomnia, unspecified    Mixed hyperlipidemia    Peyronie disease 12/20/2012   PER uROLOGY   Seasonal allergies    Sleep apnea    uses cpap   Tympanic membrane perforation    history of    Current Outpatient Medications  Medication Sig Dispense Refill   blood glucose meter kit and supplies Dispense based on patient and insurance preference. Use up to four times daily as directed. (FOR ICD-10 E10.9, E11.9). 1 each 0   buPROPion (WELLBUTRIN XL) 300 MG 24 hr tablet Take 1 tablet (300 mg total) by mouth daily. 90 tablet 1   calcium carbonate (TUMS - DOSED IN MG ELEMENTAL CALCIUM) 500 MG chewable tablet Chew 3 tablets by mouth daily.     citalopram (CELEXA) 40 MG tablet Take  1 tablet (40 mg total) by mouth daily. 90 tablet 1   clonazePAM (KLONOPIN) 0.5 MG tablet TAKE 1 TABLET BY MOUTH DAILY AS NEEDED FOR ANXIETY 20 tablet 0   diphenhydrAMINE (BENADRYL) 25 mg capsule Take 50 mg by mouth at bedtime.     erythromycin with ethanol (THERAMYCIN) 2 % external solution APPLY TOPICALLY TWICE A DAY AS NEEDED 60 mL 0   Evolocumab (REPATHA SURECLICK) 140 MG/ML SOAJ Inject 140 mg into the skin every 14 (fourteen) days. 2 mL 11   ezetimibe (ZETIA) 10 MG tablet TAKE 1 TABLET BY MOUTH DAILY 90 tablet 0   fexofenadine (ALLEGRA) 180 MG tablet Take 180 mg by mouth daily.     fluticasone (FLONASE) 50 MCG/ACT nasal spray PLACE 1 SPRAY INTO BOTH NOSTRILS 2 TIMESDAILY 16 g 5   hydrOXYzine (VISTARIL) 25 MG capsule TAKE 1 CAPSULE BY MOUTH EVERY 8 HOURS ASNEEDED (Patient taking differently: Take 25 mg by mouth every 4 (four) hours as needed.) 90 capsule 0   levothyroxine (SYNTHROID) 75 MCG tablet TAKE 1 TABLET BY MOUTH DAILY 90 tablet 3   methocarbamol (ROBAXIN) 500 MG tablet Take 1 tablet (500 mg total) by mouth every 8 (eight) hours as needed for muscle spasms. 270 tablet 0  montelukast (SINGULAIR) 10 MG tablet Take 1 tablet (10 mg total) by mouth at bedtime. 90 tablet 1   pantoprazole (PROTONIX) 40 MG tablet Take 1 tablet (40 mg total) by mouth daily. 90 tablet 1   pregabalin (LYRICA) 150 MG capsule Take 1 capsule (150 mg total) by mouth 2 (two) times daily. 60 capsule 0   Pseudoephedrine-guaiFENesin (MUCINEX D MAX STRENGTH PO) Take 1 tablet by mouth every 12 (twelve) hours.     sodium chloride (OCEAN) 0.65 % SOLN nasal spray Place 1 spray into both nostrils as needed for congestion. 15 mL 0   tamsulosin (FLOMAX) 0.4 MG CAPS capsule Take 2 capsules (0.8 mg total) by mouth at bedtime. 180 capsule 1   traMADol (ULTRAM) 50 MG tablet Take 1 tablet (50 mg total) by mouth daily as needed. 30 tablet 0   No current facility-administered medications for this visit.    Allergies  Allergen  Reactions   Augmentin [Amoxicillin-Pot Clavulanate] Diarrhea   Demerol [Meperidine] Other (See Comments)    "goes crazy and has the strength of 10 men"   Egg-Derived Products Diarrhea and Nausea And Vomiting   Sulfamethoxazole Other (See Comments)    Blisters form in mouth   Statins Rash   Sulfa Antibiotics Rash   Sulfacetamide Sodium Rash    Family History  Problem Relation Age of Onset   Heart disease Mother    Arrhythmia Mother    Congestive Heart Failure Father    Heart disease Father    COPD Father    Heart disease Brother        stents at 55 yo   Lupus Brother    Bladder Cancer Paternal Uncle    Heart failure Paternal Grandmother    Kidney cancer Paternal Grandfather    Parkinsonism Neg Hx     Social History   Socioeconomic History   Marital status: Married    Spouse name: [name]   Number of children: 0   Years of education: college   Highest education level: Associate degree: occupational, Scientist, product/process development, or vocational program  Occupational History   Occupation: disabled    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  Tobacco Use   Smoking status: Former    Current packs/day: 0.00    Average packs/day: 2.0 packs/day for 20.0 years (40.0 ttl pk-yrs)    Types: Cigarettes    Start date: 10/09/1986    Quit date: 10/09/2006    Years since quitting: 16.5   Smokeless tobacco: Never  Vaping Use   Vaping status: Never Used  Substance and Sexual Activity   Alcohol use: Yes    Alcohol/week: 1.0 standard drink of alcohol    Types: 1 Cans of beer per week    Comment: rarely   Drug use: No   Sexual activity: Yes    Birth control/protection: Surgical  Other Topics Concern   Not on file  Social History Narrative   12/16/19   From: [place]   Living: with wife [name], since 1990   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      Family: mother and brothers are nearby      Enjoys: record collection with 10,000 LPs - working on digitizing these, fixing up a 1968 mustang        Exercise: not currently   Diet: over-eating, tries to eat somewhat healthy - salad every other day      Safety   Seat belts: Yes    Guns: Yes  and secure  Safe in relationships: Yes    Social Determinants of Health   Financial Resource Strain: Low Risk  (01/05/2023)   Overall Financial Resource Strain (CARDIA)    Difficulty of Paying Living Expenses: Not hard at all  Food Insecurity: No Food Insecurity (01/05/2023)   Hunger Vital Sign    Worried About Running Out of Food in the Last Year: Never true    Ran Out of Food in the Last Year: Never true  Transportation Needs: No Transportation Needs (01/05/2023)   PRAPARE - Administrator, Civil Service (Medical): No    Lack of Transportation (Non-Medical): No  Physical Activity: Not on file  Stress: Not on file  Social Connections: Unknown (01/05/2023)   Social Connection and Isolation Panel [NHANES]    Frequency of Communication with Friends and Family: More than three times a week    Frequency of Social Gatherings with Friends and Family: Once a week    Attends Religious Services: Patient declined    Database administrator or Organizations: Patient declined    Attends Banker Meetings: Not on file    Marital Status: Married  Catering manager Violence: Not on file     Constitutional: Patient reports fatigue.  Denies fever, malaise, headache or abrupt weight changes.  HEENT: Denies eye pain, eye redness, ear pain, ringing in the ears, wax buildup, runny nose, nasal congestion, bloody nose, or sore throat. Respiratory: Denies difficulty breathing, shortness of breath, cough or sputum production.   Cardiovascular: Denies chest pain, chest tightness, palpitations or swelling in the hands or feet.  Gastrointestinal: Denies abdominal pain, bloating, constipation, diarrhea or blood in the stool.  GU: Denies urgency, frequency, pain with urination, burning sensation, blood in urine, odor or  discharge. Musculoskeletal: Patient reports chronic back pain.  Denies decrease in range of motion, difficulty with gait, or joint swelling.  Skin: Denies redness, rashes, lesions or ulcercations.  Neurological: Patient reports insomnia.  Denies dizziness, difficulty with memory, difficulty with speech or problems with balance and coordination.  Psych: Patient has a history of anxiety and depression.  Denies SI/HI.  No other specific complaints in a complete review of systems (except as listed in HPI above).  Objective:   Physical Exam  BP 139/79   Pulse (!) 58   Ht 6\' 2"  (1.88 m)   Wt 284 lb (128.8 kg)   SpO2 99%   BMI 36.46 kg/m   Wt Readings from Last 3 Encounters:  03/20/23 282 lb (127.9 kg)  01/05/23 276 lb (125.2 kg)  11/02/22 275 lb 3.2 oz (124.8 kg)    General: Appears his stated age, obese in NAD. Neck:  Neck supple, trachea midline. No masses, lumps present.  Cardiovascular: Normal rate and rhythm. S1,S2 noted.  No murmur, rubs or gallops noted.  Pulmonary/Chest: Normal effort and positive vesicular breath sounds. No respiratory distress. No wheezes, rales or ronchi noted.  Musculoskeletal: No difficulty with gait.  Neurological: Alert and oriented.  Psychiatric: Mood and affect normal. Behavior is normal. Judgment and thought content normal.     BMET    Component Value Date/Time   NA 142 10/23/2022 1413   NA 142 01/27/2018 0000   NA 138 12/26/2012 1936   K 4.6 10/23/2022 1413   K 3.9 12/26/2012 1936   CL 105 10/23/2022 1413   CL 106 12/26/2012 1936   CO2 30 10/23/2022 1413   CO2 24 12/26/2012 1936   GLUCOSE 82 10/23/2022 1413   GLUCOSE 124 (H) 12/26/2012  1936   BUN 14 10/23/2022 1413   BUN 19 01/27/2018 0000   BUN 22 (H) 12/26/2012 1936   CREATININE 1.16 10/23/2022 1413   CALCIUM 10.0 10/23/2022 1413   CALCIUM 8.4 (L) 12/26/2012 1936   GFRNONAA >60 06/02/2020 0431   GFRNONAA 58 (L) 12/26/2012 1936   GFRAA >60 06/02/2020 0431   GFRAA >60 12/26/2012  1936    Lipid Panel     Component Value Date/Time   CHOL 180 10/23/2022 1413   TRIG 281 (H) 10/23/2022 1413   HDL 37 (L) 10/23/2022 1413   CHOLHDL 4.9 10/23/2022 1413   VLDL 29.8 06/10/2020 0919   LDLCALC 103 (H) 10/23/2022 1413    CBC    Component Value Date/Time   WBC 5.5 10/23/2022 1413   RBC 5.09 10/23/2022 1413   HGB 16.3 10/23/2022 1413   HGB 15.2 12/26/2012 1936   HCT 46.8 10/23/2022 1413   HCT 44.6 12/26/2012 1936   PLT 230 10/23/2022 1413   PLT 218 12/26/2012 1936   MCV 91.9 10/23/2022 1413   MCV 94 12/26/2012 1936   MCH 32.0 10/23/2022 1413   MCHC 34.8 10/23/2022 1413   RDW 12.4 10/23/2022 1413   RDW 13.3 12/26/2012 1936   LYMPHSABS 1.9 06/02/2020 0431   MONOABS 0.4 06/02/2020 0431   EOSABS 0.2 06/02/2020 0431   BASOSABS 0.1 06/02/2020 0431    Hgb A1C Lab Results  Component Value Date   HGBA1C 6.2 (A) 10/23/2022            Assessment & Plan:   Fatigue, OSA, anxiety and depression, hypothyroidism, vitamin D deficiency, vitamin B12 deficiency:  Will check TSH, free T4, vitamin D, B12 today If these labs are normal, would recommend he call Dr. Jayme Cloud to see if he needs his CPAP titrated Encourage regular physical activity  RTC in 1 month for your annual exam Nicki Reaper, NP

## 2023-05-01 LAB — T4, FREE: Free T4: 0.9 ng/dL (ref 0.8–1.8)

## 2023-05-01 LAB — VITAMIN D 25 HYDROXY (VIT D DEFICIENCY, FRACTURES): Vit D, 25-Hydroxy: 25 ng/mL — ABNORMAL LOW (ref 30–100)

## 2023-05-01 LAB — TSH: TSH: 2.49 mIU/L (ref 0.40–4.50)

## 2023-05-01 LAB — VITAMIN B12: Vitamin B-12: 416 pg/mL (ref 200–1100)

## 2023-05-01 MED ORDER — VITAMIN D (ERGOCALCIFEROL) 1.25 MG (50000 UNIT) PO CAPS: 50000.0000 [IU] | ORAL_CAPSULE | ORAL | 0 refills | Status: DC

## 2023-05-01 NOTE — Addendum Note (Signed)
Addended by: Lorre Munroe on: 05/01/2023 09:53 AM   Modules accepted: Orders

## 2023-05-11 ENCOUNTER — Other Ambulatory Visit: Payer: Self-pay

## 2023-05-11 ENCOUNTER — Other Ambulatory Visit: Payer: Self-pay | Admitting: Internal Medicine

## 2023-05-11 NOTE — Telephone Encounter (Signed)
Requested Prescriptions  Pending Prescriptions Disp Refills   buPROPion (WELLBUTRIN XL) 300 MG 24 hr tablet [Pharmacy Med Name: BUPROPION HCL ER (XL) 300 MG TAB] 90 tablet 1    Sig: TAKE 1 TABLET BY MOUTH DAILY     Psychiatry: Antidepressants - bupropion Passed - 05/11/2023  9:30 AM      Passed - Cr in normal range and within 360 days    Creat  Date Value Ref Range Status  10/23/2022 1.16 0.70 - 1.30 mg/dL Final   Creatinine,U  Date Value Ref Range Status  09/07/2014 133.2 mg/dL Final   Creatinine, Urine  Date Value Ref Range Status  10/23/2022 104 20 - 320 mg/dL Final         Passed - AST in normal range and within 360 days    AST  Date Value Ref Range Status  10/23/2022 28 10 - 35 U/L Final   SGOT(AST)  Date Value Ref Range Status  12/18/2012 47 (H) 15 - 37 Unit/L Final         Passed - ALT in normal range and within 360 days    ALT  Date Value Ref Range Status  10/23/2022 42 9 - 46 U/L Final   SGPT (ALT)  Date Value Ref Range Status  12/18/2012 66 12 - 78 U/L Final         Passed - Last BP in normal range    BP Readings from Last 1 Encounters:  04/30/23 139/79         Passed - Valid encounter within last 6 months    Recent Outpatient Visits           1 week ago Other fatigue   [place] [facility] [place], [name], NP   1 month ago Mass of left testicle   [place] [facility] [place], [name], NP   4 months ago Chronic right shoulder pain   [facility] [place], [name], NP   6 months ago Stage 3a chronic kidney disease [facility])   [place] [facility] [place], [name], NP   1 year ago Encounter for general adult medical examination with abnormal findings   [place] [facility] [place], [name], NP       Future Appointments             In 1 week [name], [name], NP [place] [facility], [facility]

## 2023-05-12 ENCOUNTER — Other Ambulatory Visit: Payer: Self-pay | Admitting: Internal Medicine

## 2023-05-14 ENCOUNTER — Other Ambulatory Visit: Payer: Self-pay | Admitting: Internal Medicine

## 2023-05-14 NOTE — Telephone Encounter (Signed)
Requested Prescriptions  Pending Prescriptions Disp Refills   hydrOXYzine (VISTARIL) 25 MG capsule [Pharmacy Med Name: HYDROXYZINE PAMOATE 25 MG CAP] 90 capsule 0    Sig: Take 1 capsule (25 mg total) by mouth every 4 (four) hours as needed.     Ear, Nose, and Throat:  Antihistamines 2 Passed - 05/12/2023 10:46 AM      Passed - Cr in normal range and within 360 days    Creat  Date Value Ref Range Status  10/23/2022 1.16 0.70 - 1.30 mg/dL Final   Creatinine,U  Date Value Ref Range Status  09/07/2014 133.2 mg/dL Final   Creatinine, Urine  Date Value Ref Range Status  10/23/2022 104 20 - 320 mg/dL Final         Passed - Valid encounter within last 12 months    Recent Outpatient Visits           2 weeks ago Other fatigue   [place] [facility] [place], [name], NP   1 month ago Mass of left testicle   [place] [facility] [place], [name], NP   4 months ago Chronic right shoulder pain   [place] [facility] [place], [name], NP   6 months ago Stage 3a chronic kidney disease [facility])   [place] [facility] [place], [name], NP   1 year ago Encounter for general adult medical examination with abnormal findings   [place] [facility] [place], [name], NP       Future Appointments             In 1 week [name], [name], NP [place] [facility], [facility]

## 2023-05-15 ENCOUNTER — Ambulatory Visit
Admission: RE | Admit: 2023-05-15 | Discharge: 2023-05-15 | Disposition: A | Discharge status: Home / Self Care | Payer: Medicare HMO | Source: Ambulatory Visit

## 2023-05-15 VITALS — BP 134/77 | HR 59 | Temp 97.7°F | Resp 18

## 2023-05-15 DIAGNOSIS — H60542 Acute eczematoid otitis externa, left ear: Secondary | ICD-10-CM

## 2023-05-15 DIAGNOSIS — H9202 Otalgia, left ear: Secondary | ICD-10-CM

## 2023-05-15 DIAGNOSIS — H66001 Acute suppurative otitis media without spontaneous rupture of ear drum, right ear: Secondary | ICD-10-CM | POA: Diagnosis not present

## 2023-05-15 MED ORDER — CLINDAMYCIN HCL 150 MG PO CAPS: 300.0000 mg | ORAL_CAPSULE | Freq: Two times a day (BID) | ORAL | 0 refills | Status: AC

## 2023-05-15 NOTE — ED Triage Notes (Signed)
Patient to Urgent Care with complaints of bilateral ear fullness/ pain that started two days ago.   Denies any known fevers.

## 2023-05-15 NOTE — Telephone Encounter (Signed)
Requested medication (s) are due for refill today: Yes  Requested medication (s) are on the active medication list: Yes  Last refill:  04/11/23 #60 0RF  Future visit scheduled: Yes  Notes to clinic:  Unable to refill per protocol, cannot delegate.      Requested Prescriptions  Pending Prescriptions Disp Refills   pregabalin (LYRICA) 150 MG capsule [Pharmacy Med Name: PREGABALIN 150 MG CAP] 60 capsule     Sig: TAKE ONE CAPSULE BY MOUTH TWICE A DAY     Not Delegated - Neurology:  Anticonvulsants - Controlled - pregabalin Failed - 05/14/2023 10:11 AM      Failed - This refill cannot be delegated      Passed - Cr in normal range and within 360 days    Creat  Date Value Ref Range Status  10/23/2022 1.16 0.70 - 1.30 mg/dL Final   Creatinine,U  Date Value Ref Range Status  09/07/2014 133.2 mg/dL Final   Creatinine, Urine  Date Value Ref Range Status  10/23/2022 104 20 - 320 mg/dL Final         Passed - Completed PHQ-2 or PHQ-9 in the last 360 days      Passed - Valid encounter within last 12 months    Recent Outpatient Visits           2 weeks ago Other fatigue   [place] [facility] [place], [name], NP   1 month ago Mass of left testicle   [place] [facility] [place], [name], NP   4 months ago Chronic right shoulder pain   [place] [facility] [place], [name], NP   6 months ago Stage 3a chronic kidney disease [facility])   [place] [facility] [place], [name], NP   1 year ago Encounter for general adult medical examination with abnormal findings   [place] [facility] [place], [name], NP       Future Appointments             In 1 week [name], [name], NP [place] [facility], [facility]

## 2023-05-15 NOTE — ED Provider Notes (Signed)
Corey Bell    CSN: [id number] Arrival date & time: 05/15/23  0801      History   Chief Complaint Chief Complaint  Patient presents with   Ear Fullness    Possible ear infection - Entered by patient    HPI Corey Bell is a 57 y.o. male.   Tried   Patient presents today with a concern for bilateral ear fullness and pain x 2 days. Reports history of recurrent problem and has history of ear infections. He denies any other associated URI symptoms. Pain is greater in the right ear than left.   Past Medical History:  Diagnosis Date   Anxiety    Aphthous ulcer 10/04/2012   improving   Arthritis    left index fnger   Carpal tunnel syndrome, bilateral    damaged nerve in neck   Chronic pain syndrome    COPD (chronic obstructive pulmonary disease) (HCC)    mild   Depression    Diverticulitis of colon    Esophageal reflux    H/O acute prostatitis    Hypothyroidism    Insomnia, unspecified    Mixed hyperlipidemia    Peyronie disease 12/20/2012   PER uROLOGY   Seasonal allergies    Sleep apnea    uses cpap   Tympanic membrane perforation    history of    Patient Active Problem List   Diagnosis Date Noted   Stage 3a chronic kidney disease (HCC) 10/23/2022   Myalgia due to statin 05/01/2022   Chronic upper back pain 04/25/2021   Class 2 obesity due to excess calories with body mass index (BMI) of 35.0 to 35.9 in adult 04/25/2021   BPH (benign prostatic hyperplasia) 03/02/2020   Adjustment reaction with anxiety and depression 03/06/2019   Post-traumatic stress disorder, acute 01/27/2019   Diabetes mellitus type 2, diet-controlled (HCC) 07/31/2017   GERD (gastroesophageal reflux disease) 04/07/2015   HLD (hyperlipidemia) 06/03/2014   DDD (degenerative disc disease), cervical 10/13/2013   DDD (degenerative disc disease), lumbosacral 10/13/2013   OSA (obstructive sleep apnea) 07/01/2013   Essential hypertension 05/29/2013   Hypothyroidism     Past  Surgical History:  Procedure Laterality Date   COLONOSCOPY WITH PROPOFOL N/A 12/15/2021   Procedure: COLONOSCOPY WITH PROPOFOL;  Surgeon: Wyline Mood, MD;  Location: [facility] ENDOSCOPY;  Service: Gastroenterology;  Laterality: N/A;   COLONOSCOPY WITH PROPOFOL N/A 01/12/2022   Procedure: COLONOSCOPY WITH PROPOFOL;  Surgeon: Wyline Mood, MD;  Location: [facility]OSCOPY;  Service: Gastroenterology;  Laterality: N/A;   LAPAROSCOPIC GASTRIC SLEEVE RESECTION N/A 07/31/2017   Procedure: LAPAROSCOPIC GASTRIC SLEEVE RESECTION WITH UPPER ENDOSCOPY;  Surgeon: Ovidio Kin, MD;  Location: [facility] ORS;  Service: General;  Laterality: N/A;   LumbarSacral Disc Surgery     L5-S1   MENISCUS REPAIR Left    torn meniscus   NESBIT PROCEDURE N/A 08/22/2013   Procedure: 16 DOT PLACTATION;  Surgeon: Garnett Farm, MD;  Location: [facility];  Service: Urology;  Laterality: N/A;   TYMPANOSTOMY TUBE PLACEMENT  1975   ULNAR COLLATERAL LIGAMENT REPAIR Right 04/17/2019   Procedure: RIGHT THUMB ULNAR COLLATERAL LIGAMENT REPAIR;  Surgeon: Betha Loa, MD;  Location: [facility];  Service: Orthopedics;  Laterality: Right;   ULNAR COLLATERAL LIGAMENT REPAIR Right 10/16/2019   Procedure: RIGHT THUMB ULNAR COLLATERAL LIGAMENT RECONSTRUCTION;  Surgeon: Betha Loa, MD;  Location: [facility];  Service: Orthopedics;  Laterality: Right;  block in preop   VASECTOMY Bilateral 08/22/2013  Procedure: VASECTOMY;  Surgeon: Garnett Farm, MD;  Location: [facility];  Service: Urology;  Laterality: Bilateral;       Home Medications    Prior to Admission medications   Medication Sig Start Date End Date Taking? Authorizing Provider  clindamycin (CLEOCIN) 150 MG capsule Take 2 capsules (300 mg total) by mouth 2 (two) times daily for 7 days. 05/15/23 05/22/23 Yes Bing Neighbors, NP  cyclobenzaprine (FLEXERIL) 10 MG tablet  04/16/23  Yes [provider]  blood glucose meter kit and  supplies Dispense based on patient and insurance preference. Use up to four times daily as directed. (FOR ICD-10 E10.9, E11.9). 05/16/22   Lorre Munroe, NP  buPROPion (WELLBUTRIN XL) 300 MG 24 hr tablet TAKE 1 TABLET BY MOUTH DAILY 05/11/23   Lorre Munroe, NP  calcium carbonate (TUMS - DOSED IN MG ELEMENTAL CALCIUM) 500 MG chewable tablet Chew 3 tablets by mouth daily.    [provider]  citalopram (CELEXA) 40 MG tablet Take 1 tablet (40 mg total) by mouth daily. 10/23/22   Lorre Munroe, NP  clonazePAM (KLONOPIN) 0.5 MG tablet TAKE 1 TABLET BY MOUTH DAILY AS NEEDED FOR ANXIETY 03/30/23   Lorre Munroe, NP  diphenhydrAMINE (BENADRYL) 25 mg capsule Take 50 mg by mouth at bedtime.    [provider]  erythromycin with ethanol (THERAMYCIN) 2 % external solution APPLY TOPICALLY TWICE A DAY AS NEEDED 07/18/22   Lorre Munroe, NP  Evolocumab (REPATHA SURECLICK) 140 MG/ML SOAJ Inject 140 mg into the skin every 14 (fourteen) days. 10/30/22   Antonieta Iba, MD  ezetimibe (ZETIA) 10 MG tablet TAKE 1 TABLET BY MOUTH DAILY 04/27/23   Lorre Munroe, NP  fexofenadine (ALLEGRA) 180 MG tablet Take 180 mg by mouth daily.    [provider]  fluticasone (FLONASE) 50 MCG/ACT nasal spray PLACE 1 SPRAY INTO BOTH NOSTRILS 2 TIMESDAILY 11/02/22   Lorre Munroe, NP  hydrOXYzine (VISTARIL) 25 MG capsule Take 1 capsule (25 mg total) by mouth every 4 (four) hours as needed. 05/14/23   Lorre Munroe, NP  levothyroxine (SYNTHROID) 75 MCG tablet TAKE 1 TABLET BY MOUTH DAILY 05/30/22   Lorre Munroe, NP  methocarbamol (ROBAXIN) 500 MG tablet Take 1 tablet (500 mg total) by mouth every 8 (eight) hours as needed for muscle spasms. 04/05/23   Lorre Munroe, NP  montelukast (SINGULAIR) 10 MG tablet Take 1 tablet (10 mg total) by mouth at bedtime. 10/23/22   Lorre Munroe, NP  pantoprazole (PROTONIX) 40 MG tablet Take 1 tablet (40 mg total) by mouth daily. 10/23/22   Lorre Munroe, NP   pregabalin (LYRICA) 150 MG capsule TAKE ONE CAPSULE BY MOUTH TWICE A DAY 05/15/23   Lorre Munroe, NP  Pseudoephedrine-guaiFENesin (MUCINEX D MAX STRENGTH PO) Take 1 tablet by mouth every 12 (twelve) hours.    [provider]  sodium chloride (OCEAN) 0.65 % SOLN nasal spray Place 1 spray into both nostrils as needed for congestion. 06/17/20   Roxy Horseman, PA-C  tamsulosin (FLOMAX) 0.4 MG CAPS capsule Take 2 capsules (0.8 mg total) by mouth at bedtime. 04/30/23   Lorre Munroe, NP  traMADol (ULTRAM) 50 MG tablet Take 1 tablet (50 mg total) by mouth daily as needed. 02/05/23   Lorre Munroe, NP  Vitamin D, Ergocalciferol, (DRISDOL) 1.25 MG (50000 UNIT) CAPS capsule Take 1 capsule (50,000 Units total) by mouth once a week. For  12 weeks. Then start OTC Vitamin D3 2,000 unit daily. 05/01/23   Lorre Munroe, NP    Family History Family History  Problem Relation Age of Onset   Heart disease Mother    Arrhythmia Mother    Congestive Heart Failure Father    Heart disease Father    COPD Father    Heart disease Brother        stents at 68 yo   Lupus Brother    Bladder Cancer Paternal Uncle    Heart failure Paternal Grandmother    Kidney cancer Paternal Grandfather    Parkinsonism Neg Hx     Social History Social History   Tobacco Use   Smoking status: Former    Current packs/day: 0.00    Average packs/day: 2.0 packs/day for 20.0 years (40.0 ttl pk-yrs)    Types: Cigarettes    Start date: 10/09/1986    Quit date: 10/09/2006    Years since quitting: 16.6   Smokeless tobacco: Never  Vaping Use   Vaping status: Never Used  Substance Use Topics   Alcohol use: Yes    Alcohol/week: 1.0 standard drink of alcohol    Types: 1 Cans of beer per week    Comment: rarely   Drug use: No     Allergies   Augmentin [amoxicillin-pot clavulanate], Demerol [meperidine], Egg-derived products, Sulfamethoxazole, Statins, Sulfa antibiotics, and Sulfacetamide sodium   Review of  Systems Review of Systems Pertinent negatives listed in HPI    Physical Exam Triage Vital Signs ED Triage Vitals  Encounter Vitals Group     BP 05/15/23 0809 134/77     Systolic BP Percentile --      Diastolic BP Percentile --      Pulse Rate 05/15/23 0809 (!) 59     Resp 05/15/23 0809 18     Temp 05/15/23 0809 97.7 F (36.5 C)     Temp src --      SpO2 05/15/23 0809 98 %     Weight --      Height --      Head Circumference --      Peak Flow --      Pain Score 05/15/23 0806 4     Pain Loc --      Pain Education --      Exclude from Growth Chart --    No data found.  Updated Vital Signs BP 134/77   Pulse (!) 59   Temp 97.7 F (36.5 C)   Resp 18   SpO2 98%   Visual Acuity Right Eye Distance:   Left Eye Distance:   Bilateral Distance:    Right Eye Near:   Left Eye Near:    Bilateral Near:     Physical Exam Vitals and nursing note reviewed.  Constitutional:      Appearance: Normal appearance.  HENT:     Right Ear: Swelling and tenderness present. Tympanic membrane is erythematous and bulging.     Left Ear: Tenderness present.     Ears:     Comments: Dry, flaky skin present in the ear canal Cardiovascular:     Rate and Rhythm: Normal rate and regular rhythm.  Pulmonary:     Effort: Pulmonary effort is normal.     Breath sounds: Normal breath sounds.  Neurological:     Mental Status: He is alert.      UC Treatments / Results  Labs (all labs ordered are listed, but only abnormal results are displayed) Labs  Reviewed - No data to display  EKG   Radiology No results found.  Procedures Procedures (including critical care time)  Medications Ordered in UC Medications - No data to display  Initial Impression / Assessment and Plan / UC Course  I have reviewed the triage vital signs and the nursing notes.  Pertinent labs & imaging results that were available during my care of the patient were reviewed by me and considered in my medical decision  making (see chart for details).    Complicated right ear infection with a left ear otalgia likely from dryness of ear canal.  Treatment per discharge medication orders.  Return or follow-up PCP if symptoms do not resolve.  Final Clinical Impressions(s) / UC Diagnoses   Final diagnoses:  Non-recurrent acute suppurative otitis media of right ear without spontaneous rupture of tympanic membrane  Acute otalgia, left  Dermatitis of left ear canal   Discharge Instructions   None    ED Prescriptions     Medication Sig Dispense Auth. Provider   clindamycin (CLEOCIN) 150 MG capsule Take 2 capsules (300 mg total) by mouth 2 (two) times daily for 7 days. 28 capsule Bing Neighbors, NP      PDMP not reviewed this encounter.   Bing Neighbors, NP 05/18/23 [phone]

## 2023-05-24 ENCOUNTER — Ambulatory Visit (INDEPENDENT_AMBULATORY_CARE_PROVIDER_SITE_OTHER): Payer: Medicare HMO | Admitting: Internal Medicine

## 2023-05-24 ENCOUNTER — Encounter: Payer: Self-pay | Admitting: Internal Medicine

## 2023-05-24 VITALS — BP 132/76 | HR 62 | Temp 96.8°F | Ht 74.0 in | Wt 289.0 lb

## 2023-05-24 DIAGNOSIS — Z0001 Encounter for general adult medical examination with abnormal findings: Secondary | ICD-10-CM

## 2023-05-24 DIAGNOSIS — Z125 Encounter for screening for malignant neoplasm of prostate: Secondary | ICD-10-CM

## 2023-05-24 DIAGNOSIS — E038 Other specified hypothyroidism: Secondary | ICD-10-CM

## 2023-05-24 DIAGNOSIS — Z6837 Body mass index (BMI) 37.0-37.9, adult: Secondary | ICD-10-CM

## 2023-05-24 MED ORDER — FLUTICASONE PROPIONATE 50 MCG/ACT NA SUSP: 2.0000 | Freq: Every day | NASAL | 5 refills | Status: AC

## 2023-05-24 MED ORDER — PANTOPRAZOLE SODIUM 40 MG PO TBEC: 40.0000 mg | DELAYED_RELEASE_TABLET | Freq: Every day | ORAL | 1 refills | Status: DC

## 2023-05-24 MED ORDER — MONTELUKAST SODIUM 10 MG PO TABS: 10.0000 mg | ORAL_TABLET | Freq: Every day | ORAL | 1 refills | Status: DC

## 2023-05-24 MED ORDER — HYDROXYZINE PAMOATE 25 MG PO CAPS: 25.0000 mg | ORAL_CAPSULE | ORAL | 0 refills | Status: DC | PRN

## 2023-05-24 MED ORDER — METHOCARBAMOL 500 MG PO TABS: 500.0000 mg | ORAL_TABLET | Freq: Three times a day (TID) | ORAL | 0 refills | Status: DC | PRN

## 2023-05-24 MED ORDER — CYCLOBENZAPRINE HCL 10 MG PO TABS: 10.0000 mg | ORAL_TABLET | Freq: Every day | ORAL | 0 refills | Status: DC | PRN

## 2023-05-24 MED ORDER — PREGABALIN 150 MG PO CAPS: 150.0000 mg | ORAL_CAPSULE | Freq: Two times a day (BID) | ORAL | 0 refills | Status: DC

## 2023-05-24 MED ORDER — CITALOPRAM HYDROBROMIDE 40 MG PO TABS: 40.0000 mg | ORAL_TABLET | Freq: Every day | ORAL | 1 refills | Status: DC

## 2023-05-24 MED ORDER — CLONAZEPAM 0.5 MG PO TABS: 0.5000 mg | ORAL_TABLET | Freq: Every day | ORAL | 0 refills | Status: DC | PRN

## 2023-05-24 MED ORDER — TRAMADOL HCL 50 MG PO TABS: 50.0000 mg | ORAL_TABLET | Freq: Every day | ORAL | 0 refills | Status: DC | PRN

## 2023-05-24 MED ORDER — TIRZEPATIDE 2.5 MG/0.5ML ~~LOC~~ SOAJ: 2.5000 mg | SUBCUTANEOUS | 0 refills | Status: DC

## 2023-05-24 NOTE — Assessment & Plan Note (Signed)
Encourage diet and exercise for weight loss 

## 2023-05-24 NOTE — Patient Instructions (Signed)
Health Maintenance, Male Adopting a healthy lifestyle and getting preventive care are important in promoting health and wellness. Ask your health care provider about: The right schedule for you to have regular tests and exams. Things you can do on your own to prevent diseases and keep yourself healthy. What should I know about diet, weight, and exercise? Eat a healthy diet  Eat a diet that includes plenty of vegetables, fruits, low-fat dairy products, and lean protein. Do not eat a lot of foods that are high in solid fats, added sugars, or sodium. Maintain a healthy weight Body mass index (BMI) is a measurement that can be used to identify possible weight problems. It estimates body fat based on height and weight. Your health care provider can help determine your BMI and help you achieve or maintain a healthy weight. Get regular exercise Get regular exercise. This is one of the most important things you can do for your health. Most adults should: Exercise for at least 150 minutes each week. The exercise should increase your heart rate and make you sweat (moderate-intensity exercise). Do strengthening exercises at least twice a week. This is in addition to the moderate-intensity exercise. Spend less time sitting. Even light physical activity can be beneficial. Watch cholesterol and blood lipids Have your blood tested for lipids and cholesterol at 56 years of age, then have this test every 5 years. You may need to have your cholesterol levels checked more often if: Your lipid or cholesterol levels are high. You are older than 56 years of age. You are at high risk for heart disease. What should I know about cancer screening? Many types of cancers can be detected early and may often be prevented. Depending on your health history and family history, you may need to have cancer screening at various ages. This may include screening for: Colorectal cancer. Prostate cancer. Skin cancer. Lung  cancer. What should I know about heart disease, diabetes, and high blood pressure? Blood pressure and heart disease High blood pressure causes heart disease and increases the risk of stroke. This is more likely to develop in people who have high blood pressure readings or are overweight. Talk with your health care provider about your target blood pressure readings. Have your blood pressure checked: Every 3-5 years if you are 18-39 years of age. Every year if you are 40 years old or older. If you are between the ages of 65 and 75 and are a current or former smoker, ask your health care provider if you should have a one-time screening for abdominal aortic aneurysm (AAA). Diabetes Have regular diabetes screenings. This checks your fasting blood sugar level. Have the screening done: Once every three years after age 45 if you are at a normal weight and have a low risk for diabetes. More often and at a younger age if you are overweight or have a high risk for diabetes. What should I know about preventing infection? Hepatitis B If you have a higher risk for hepatitis B, you should be screened for this virus. Talk with your health care provider to find out if you are at risk for hepatitis B infection. Hepatitis C Blood testing is recommended for: Everyone born from 1945 through 1965. Anyone with known risk factors for hepatitis C. Sexually transmitted infections (STIs) You should be screened each year for STIs, including gonorrhea and chlamydia, if: You are sexually active and are younger than 56 years of age. You are older than 56 years of age and your   health care provider tells you that you are at risk for this type of infection. Your sexual activity has changed since you were last screened, and you are at increased risk for chlamydia or gonorrhea. Ask your health care provider if you are at risk. Ask your health care provider about whether you are at high risk for HIV. Your health care provider  may recommend a prescription medicine to help prevent HIV infection. If you choose to take medicine to prevent HIV, you should first get tested for HIV. You should then be tested every 3 months for as long as you are taking the medicine. Follow these instructions at home: Alcohol use Do not drink alcohol if your health care provider tells you not to drink. If you drink alcohol: Limit how much you have to 0-2 drinks a day. Know how much alcohol is in your drink. In the [place] one drink equals one 12 oz bottle of beer (355 mL), one 5 oz glass of wine (148 mL), or one 1 oz glass of hard liquor (44 mL). Lifestyle Do not use any products that contain nicotine or tobacco. These products include cigarettes, chewing tobacco, and vaping devices, such as e-cigarettes. If you need help quitting, ask your health care provider. Do not use street drugs. Do not share needles. Ask your health care provider for help if you need support or information about quitting drugs. General instructions Schedule regular health, dental, and eye exams. Stay current with your vaccines. Tell your health care provider if: You often feel depressed. You have ever been abused or do not feel safe at home. Summary Adopting a healthy lifestyle and getting preventive care are important in promoting health and wellness. Follow your health care provider's instructions about healthy diet, exercising, and getting tested or screened for diseases. Follow your health care provider's instructions on monitoring your cholesterol and blood pressure. This information is not intended to replace advice given to you by your health care provider. Make sure you discuss any questions you have with your health care provider. Document Revised: 02/14/2021 Document Reviewed: 02/14/2021 [employer]vier Patient Education  2024 [employer]  

## 2023-05-24 NOTE — Progress Notes (Signed)
Subjective:    Patient ID: Corey Bell, male    DOB: 12-17-1965, 57 y.o.   MRN: [id number]  HPI  Patient presents to the clinic today for his annual exam.  Flu: never Tetanus: 12/2017 COVID: X 2 Shingrix: 03/2018, 05/2018 PSA screening: 10/2022 Colon screening: 01/2022 Vision screening: annually Dentist: biannually  Diet: He does eat meat. He consumes fruits and veggies. He does eat some fried foods. He drinks mostly decaf sweet tea, dt lemonade and water Exercise: None  Review of Systems     Past Medical History:  Diagnosis Date   Anxiety    Aphthous ulcer 10/04/2012   improving   Arthritis    left index fnger   Carpal tunnel syndrome, bilateral    damaged nerve in neck   Chronic pain syndrome    COPD (chronic obstructive pulmonary disease) (HCC)    mild   Depression    Diverticulitis of colon    Esophageal reflux    H/O acute prostatitis    Hypothyroidism    Insomnia, unspecified    Mixed hyperlipidemia    Peyronie disease 12/20/2012   PER uROLOGY   Seasonal allergies    Sleep apnea    uses cpap   Tympanic membrane perforation    history of    Current Outpatient Medications  Medication Sig Dispense Refill   blood glucose meter kit and supplies Dispense based on patient and insurance preference. Use up to four times daily as directed. (FOR ICD-10 E10.9, E11.9). 1 each 0   buPROPion (WELLBUTRIN XL) 300 MG 24 hr tablet TAKE 1 TABLET BY MOUTH DAILY 90 tablet 1   calcium carbonate (TUMS - DOSED IN MG ELEMENTAL CALCIUM) 500 MG chewable tablet Chew 3 tablets by mouth daily.     citalopram (CELEXA) 40 MG tablet Take 1 tablet (40 mg total) by mouth daily. 90 tablet 1   clonazePAM (KLONOPIN) 0.5 MG tablet TAKE 1 TABLET BY MOUTH DAILY AS NEEDED FOR ANXIETY 20 tablet 0   cyclobenzaprine (FLEXERIL) 10 MG tablet      diphenhydrAMINE (BENADRYL) 25 mg capsule Take 50 mg by mouth at bedtime.     erythromycin with ethanol (THERAMYCIN) 2 % external solution APPLY TOPICALLY  TWICE A DAY AS NEEDED 60 mL 0   Evolocumab (REPATHA SURECLICK) 140 MG/ML SOAJ Inject 140 mg into the skin every 14 (fourteen) days. 2 mL 11   ezetimibe (ZETIA) 10 MG tablet TAKE 1 TABLET BY MOUTH DAILY 90 tablet 0   fexofenadine (ALLEGRA) 180 MG tablet Take 180 mg by mouth daily.     fluticasone (FLONASE) 50 MCG/ACT nasal spray PLACE 1 SPRAY INTO BOTH NOSTRILS 2 TIMESDAILY 16 g 5   hydrOXYzine (VISTARIL) 25 MG capsule Take 1 capsule (25 mg total) by mouth every 4 (four) hours as needed. 90 capsule 0   levothyroxine (SYNTHROID) 75 MCG tablet TAKE 1 TABLET BY MOUTH DAILY 90 tablet 3   methocarbamol (ROBAXIN) 500 MG tablet Take 1 tablet (500 mg total) by mouth every 8 (eight) hours as needed for muscle spasms. 270 tablet 0   montelukast (SINGULAIR) 10 MG tablet Take 1 tablet (10 mg total) by mouth at bedtime. 90 tablet 1   pantoprazole (PROTONIX) 40 MG tablet Take 1 tablet (40 mg total) by mouth daily. 90 tablet 1   pregabalin (LYRICA) 150 MG capsule TAKE ONE CAPSULE BY MOUTH TWICE A DAY 60 capsule 0   Pseudoephedrine-guaiFENesin (MUCINEX D MAX STRENGTH PO) Take 1 tablet by mouth every 12 (twelve)  hours.     sodium chloride (OCEAN) 0.65 % SOLN nasal spray Place 1 spray into both nostrils as needed for congestion. 15 mL 0   tamsulosin (FLOMAX) 0.4 MG CAPS capsule Take 2 capsules (0.8 mg total) by mouth at bedtime. 180 capsule 1   traMADol (ULTRAM) 50 MG tablet Take 1 tablet (50 mg total) by mouth daily as needed. 30 tablet 0   Vitamin D, Ergocalciferol, (DRISDOL) 1.25 MG (50000 UNIT) CAPS capsule Take 1 capsule (50,000 Units total) by mouth once a week. For 12 weeks. Then start OTC Vitamin D3 2,000 unit daily. 12 capsule 0   No current facility-administered medications for this visit.    Allergies  Allergen Reactions   Augmentin [Amoxicillin-Pot Clavulanate] Diarrhea   Demerol [Meperidine] Other (See Comments)    "goes crazy and has the strength of 10 men"   Egg-Derived Products Diarrhea and  Nausea And Vomiting   Sulfamethoxazole Other (See Comments)    Blisters form in mouth   Statins Rash   Sulfa Antibiotics Rash   Sulfacetamide Sodium Rash    Family History  Problem Relation Age of Onset   Heart disease Mother    Arrhythmia Mother    Congestive Heart Failure Father    Heart disease Father    COPD Father    Heart disease Brother        stents at 35 yo   Lupus Brother    Bladder Cancer Paternal Uncle    Heart failure Paternal Grandmother    Kidney cancer Paternal Grandfather    Parkinsonism Neg Hx     Social History   Socioeconomic History   Marital status: Married    Spouse name: [name]   Number of children: 0   Years of education: college   Highest education level: Associate degree: occupational, Scientist, product/process development, or vocational program  Occupational History   Occupation: disabled    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  Tobacco Use   Smoking status: Former    Current packs/day: 0.00    Average packs/day: 2.0 packs/day for 20.0 years (40.0 ttl pk-yrs)    Types: Cigarettes    Start date: 10/09/1986    Quit date: 10/09/2006    Years since quitting: 16.6   Smokeless tobacco: Never  Vaping Use   Vaping status: Never Used  Substance and Sexual Activity   Alcohol use: Yes    Alcohol/week: 1.0 standard drink of alcohol    Types: 1 Cans of beer per week    Comment: rarely   Drug use: No   Sexual activity: Yes    Birth control/protection: Surgical  Other Topics Concern   Not on file  Social History Narrative   12/16/19   From: [place]   Living: with wife [name], since 1990   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      Family: mother and brothers are nearby      Enjoys: record collection with 10,000 LPs - working on digitizing these, fixing up a 1968 mustang       Exercise: not currently   Diet: over-eating, tries to eat somewhat healthy - salad every other day      Safety   Seat belts: Yes    Guns: Yes  and secure   Safe in relationships:  Yes    Social Determinants of Health   Financial Resource Strain: Low Risk  (01/05/2023)   Overall Financial Resource Strain (CARDIA)    Difficulty of Paying Living Expenses: Not hard  at all  Food Insecurity: No Food Insecurity (01/05/2023)   Hunger Vital Sign    Worried About Running Out of Food in the Last Year: Never true    Ran Out of Food in the Last Year: Never true  Transportation Needs: No Transportation Needs (01/05/2023)   PRAPARE - Administrator, Civil Service (Medical): No    Lack of Transportation (Non-Medical): No  Physical Activity: Not on file  Stress: Not on file  Social Connections: Unknown (01/05/2023)   Social Connection and Isolation Panel [NHANES]    Frequency of Communication with Friends and Family: More than three times a week    Frequency of Social Gatherings with Friends and Family: Once a week    Attends Religious Services: Patient declined    Database administrator or Organizations: Patient declined    Attends Engineer, structural: Not on file    Marital Status: Married  Catering manager Violence: Not on file     Constitutional: Denies fever, malaise, fatigue, headache or abrupt weight changes.  HEENT: Denies eye pain, eye redness, ear pain, ringing in the ears, wax buildup, runny nose, nasal congestion, bloody nose, or sore throat. Respiratory: Pt reports chronic shortness of breath. Denies difficulty breathing, cough or sputum production.   Cardiovascular: Denies chest pain, chest tightness, palpitations or swelling in the hands or feet.  Gastrointestinal: Denies abdominal pain, bloating, constipation, diarrhea or blood in the stool.  GU: Denies urgency, frequency, pain with urination, burning sensation, blood in urine, odor or discharge. Musculoskeletal: Patient reports chronic back pain.  Denies decrease in range of motion, difficulty with gait, or joint swelling.  Skin: Denies redness, rashes, lesions or ulcercations.   Neurological: Pt reports paresthesia of left lower extremity. Denies dizziness, difficulty with memory, difficulty with speech or problems with balance and coordination.  Psych: Patient has a history of anxiety and depression.  Denies SI/HI.  No other specific complaints in a complete review of systems (except as listed in HPI above).  Objective:   Physical Exam BP 132/76 (BP Location: Right Arm, Patient Position: Sitting, Cuff Size: Large)   Pulse 62   Temp (!) 96.8 F (36 C) (Temporal)   Ht 6\' 2"  (1.88 m)   Wt 289 lb (131.1 kg)   SpO2 96%   BMI 37.11 kg/m   Wt Readings from Last 3 Encounters:  04/30/23 284 lb (128.8 kg)  03/20/23 282 lb (127.9 kg)  01/05/23 276 lb (125.2 kg)    General: Appears his stated age, obese, in NAD. Skin: Warm, dry and intact. No ulcerations noted. HEENT: Head: normal shape and size; Eyes: sclera white, no icterus, conjunctiva pink, PERRLA and EOMs intact;  Neck:  Neck supple, trachea midline. No masses, lumps or thyromegaly present.  Cardiovascular: Normal rate and rhythm. S1,S2 noted.  No murmur, rubs or gallops noted. No JVD or BLE edema. No carotid bruits noted. Pulmonary/Chest: Normal effort and positive vesicular breath sounds. No respiratory distress. No wheezes, rales or ronchi noted.  Abdomen: Normal bowel sounds.  Musculoskeletal: Bony tenderness noted over the lumbar spine.  Strength 5/5 BUE/BLE.  No difficulty with gait.  Neurological: Alert and oriented. Cranial nerves II-XII grossly intact. Coordination normal.  Psychiatric: Mood and affect normal. Behavior is normal. Judgment and thought content normal.     BMET    Component Value Date/Time   NA 142 10/23/2022 1413   NA 142 01/27/2018 0000   NA 138 12/26/2012 1936   K 4.6 10/23/2022 1413  K 3.9 12/26/2012 1936   CL 105 10/23/2022 1413   CL 106 12/26/2012 1936   CO2 30 10/23/2022 1413   CO2 24 12/26/2012 1936   GLUCOSE 82 10/23/2022 1413   GLUCOSE 124 (H) 12/26/2012 1936    BUN 14 10/23/2022 1413   BUN 19 01/27/2018 0000   BUN 22 (H) 12/26/2012 1936   CREATININE 1.16 10/23/2022 1413   CALCIUM 10.0 10/23/2022 1413   CALCIUM 8.4 (L) 12/26/2012 1936   GFRNONAA >60 06/02/2020 0431   GFRNONAA 58 (L) 12/26/2012 1936   GFRAA >60 06/02/2020 0431   GFRAA >60 12/26/2012 1936    Lipid Panel     Component Value Date/Time   CHOL 180 10/23/2022 1413   TRIG 281 (H) 10/23/2022 1413   HDL 37 (L) 10/23/2022 1413   CHOLHDL 4.9 10/23/2022 1413   VLDL 29.8 06/10/2020 0919   LDLCALC 103 (H) 10/23/2022 1413    CBC    Component Value Date/Time   WBC 5.5 10/23/2022 1413   RBC 5.09 10/23/2022 1413   HGB 16.3 10/23/2022 1413   HGB 15.2 12/26/2012 1936   HCT 46.8 10/23/2022 1413   HCT 44.6 12/26/2012 1936   PLT 230 10/23/2022 1413   PLT 218 12/26/2012 1936   MCV 91.9 10/23/2022 1413   MCV 94 12/26/2012 1936   MCH 32.0 10/23/2022 1413   MCHC 34.8 10/23/2022 1413   RDW 12.4 10/23/2022 1413   RDW 13.3 12/26/2012 1936   LYMPHSABS 1.9 06/02/2020 0431   MONOABS 0.4 06/02/2020 0431   EOSABS 0.2 06/02/2020 0431   BASOSABS 0.1 06/02/2020 0431    Hgb A1C Lab Results  Component Value Date   HGBA1C 6.2 (A) 10/23/2022           Assessment & Plan:   Preventative health maintenance:  Encouraged him to get a flu shot in fall Tetanus UTD Encouraged him to get his COVID booster Pneumovax UTD Shingrix UTD Colon screening UTD Encouraged him to consume a balanced diet and exercise regimen Advised him to see an eye doctor and dentist annually Will check CBC, CMET, TSH, Free T4, lipid, A1c and PSA today  RTC in 6 months, follow-up chronic conditions Nicki Reaper, NP

## 2023-05-25 LAB — COMPLETE METABOLIC PANEL WITH GFR
AG Ratio: 1.7 (calc) (ref 1.0–2.5)
ALT: 53 U/L — ABNORMAL HIGH (ref 9–46)
AST: 37 U/L — ABNORMAL HIGH (ref 10–35)
Albumin: 4.5 g/dL (ref 3.6–5.1)
Alkaline phosphatase (APISO): 74 U/L (ref 35–144)
BUN: 18 mg/dL (ref 7–25)
CO2: 27 mmol/L (ref 20–32)
Calcium: 9.5 mg/dL (ref 8.6–10.3)
Chloride: 104 mmol/L (ref 98–110)
Creat: 1.23 mg/dL (ref 0.70–1.30)
Globulin: 2.6 g/dL (ref 1.9–3.7)
Glucose, Bld: 160 mg/dL — ABNORMAL HIGH (ref 65–139)
Potassium: 4.2 mmol/L (ref 3.5–5.3)
Sodium: 140 mmol/L (ref 135–146)
Total Bilirubin: 1.6 mg/dL — ABNORMAL HIGH (ref 0.2–1.2)
Total Protein: 7.1 g/dL (ref 6.1–8.1)
eGFR: 69 mL/min/{1.73_m2} (ref >=60)

## 2023-05-25 NOTE — Addendum Note (Signed)
Addended by: Lorre Munroe on: 05/25/2023 09:07 AM   Modules accepted: Orders

## 2023-05-28 ENCOUNTER — Encounter: Payer: Self-pay | Admitting: Internal Medicine

## 2023-05-29 ENCOUNTER — Other Ambulatory Visit: Payer: Medicare HMO

## 2023-05-29 DIAGNOSIS — Z125 Encounter for screening for malignant neoplasm of prostate: Secondary | ICD-10-CM

## 2023-05-29 DIAGNOSIS — Z0001 Encounter for general adult medical examination with abnormal findings: Secondary | ICD-10-CM

## 2023-05-29 DIAGNOSIS — E038 Other specified hypothyroidism: Secondary | ICD-10-CM | POA: Diagnosis not present

## 2023-05-29 DIAGNOSIS — E119 Type 2 diabetes mellitus without complications: Secondary | ICD-10-CM | POA: Diagnosis not present

## 2023-05-30 ENCOUNTER — Encounter: Payer: Self-pay | Admitting: Internal Medicine

## 2023-05-30 LAB — T4, FREE: Free T4: 1 ng/dL (ref 0.8–1.8)

## 2023-05-30 LAB — LIPID PANEL
Cholesterol: 96 mg/dL (ref <200)
HDL: 40 mg/dL (ref >=40)
LDL Cholesterol (Calc): 26 mg/dL
Non-HDL Cholesterol (Calc): 56 mg/dL (ref <130)
Total CHOL/HDL Ratio: 2.4 (calc) (ref <5.0)
Triglycerides: 240 mg/dL — ABNORMAL HIGH (ref <150)

## 2023-05-30 LAB — TSH: TSH: 2.07 m[IU]/L (ref 0.40–4.50)

## 2023-05-30 LAB — HEMOGLOBIN A1C
Hgb A1c MFr Bld: 7 %{Hb} — ABNORMAL HIGH (ref <5.7)
Mean Plasma Glucose: 154 mg/dL
eAG (mmol/L): 8.5 mmol/L

## 2023-05-30 LAB — PSA: PSA: 0.24 ng/mL (ref <=4.00)

## 2023-05-31 ENCOUNTER — Ambulatory Visit: Payer: Medicare HMO | Admitting: Podiatry

## 2023-05-31 MED ORDER — LEVOTHYROXINE SODIUM 75 MCG PO TABS: 75.0000 ug | ORAL_TABLET | Freq: Every day | ORAL | 1 refills | Status: DC

## 2023-06-18 ENCOUNTER — Telehealth: Payer: Medicare HMO | Admitting: Nurse Practitioner

## 2023-06-18 DIAGNOSIS — B349 Viral infection, unspecified: Secondary | ICD-10-CM | POA: Diagnosis not present

## 2023-06-18 MED ORDER — ONDANSETRON 4 MG PO TBDP
4.0000 mg | ORAL_TABLET | Freq: Three times a day (TID) | ORAL | 0 refills | Status: DC | PRN
Start: 2023-06-18 — End: 2023-11-27

## 2023-06-18 NOTE — Progress Notes (Signed)
E-Visit for Viral Illness  We are sorry that you are not feeling well. Here is how we plan to help!  Based on what you have shared with me it looks like you have a Virus that is irritating your GI tract.  Vomiting is the forceful emptying of a portion of the stomach's content through the mouth.  Although nausea and vomiting can make you feel miserable, it's important to remember that these are not diseases, but rather symptoms of an underlying illness.  When we treat short term symptoms, we always caution that any symptoms that persist should be fully evaluated in a medical office.  I have prescribed a medication that will help alleviate your symptoms and allow you to stay hydrated:  Zofran 4 mg 1 tablet every 8 hours as needed for nausea and vomiting  HOME CARE: Drink clear liquids.  This is very important! Dehydration (the lack of fluid) can lead to a serious complication.  Start off with 1 tablespoon every 5 minutes for 8 hours. You may begin eating bland foods after 8 hours without vomiting.  Start with saltine crackers, white bread, rice, mashed potatoes, applesauce. After 48 hours on a bland diet, you may resume a normal diet. Try to go to sleep.  Sleep often empties the stomach and relieves the need to vomit.  GET HELP RIGHT AWAY IF:  Your symptoms do not improve or worsen within 2 days after treatment. You have a fever for over 3 days. You cannot keep down fluids after trying the medication.  MAKE SURE YOU:  Understand these instructions. Will watch your condition. Will get help right away if you are not doing well or get worse.    Thank you for choosing an e-visit.  Your e-visit answers were reviewed by a board certified advanced clinical practitioner to complete your personal care plan. Depending upon the condition, your plan could have included both over the counter or prescription medications.  Please review your pharmacy choice. Make sure the pharmacy is open so you can  pick up prescription now. If there is a problem, you may contact your provider through [employer] and have the prescription routed to another pharmacy.  Your safety is important to [place]. If you have drug allergies check your prescription carefully.   For the next 24 hours you can use MyChart to ask questions about today's visit, request a non-urgent call back, or ask for a work or school excuse. You will get an email in the next two days asking about your experience. I hope that your e-visit has been valuable and will speed your recovery.   Meds ordered this encounter  Medications   ondansetron (ZOFRAN-ODT) 4 MG disintegrating tablet    Sig: Take 1 tablet (4 mg total) by mouth every 8 (eight) hours as needed.    Dispense:  20 tablet    Refill:  0    I spent approximately 5 minutes reviewing the patient's history, current symptoms and coordinating their care today.

## 2023-06-21 ENCOUNTER — Ambulatory Visit: Payer: Medicare HMO | Admitting: Internal Medicine

## 2023-06-26 ENCOUNTER — Encounter: Payer: Self-pay | Admitting: Internal Medicine

## 2023-06-26 ENCOUNTER — Ambulatory Visit: Payer: Medicare HMO | Admitting: Internal Medicine

## 2023-06-26 DIAGNOSIS — F320 Major depressive disorder, single episode, mild: Secondary | ICD-10-CM | POA: Diagnosis not present

## 2023-06-29 MED ORDER — BREXPIPRAZOLE 1 MG PO TABS: 1.0000 mg | ORAL_TABLET | Freq: Every day | ORAL | 0 refills | Status: DC

## 2023-06-29 NOTE — Addendum Note (Signed)
Addended by: Lorre Munroe on: 06/29/2023 01:13 PM   Modules accepted: Orders

## 2023-06-30 ENCOUNTER — Other Ambulatory Visit: Payer: Self-pay | Admitting: Internal Medicine

## 2023-07-02 ENCOUNTER — Other Ambulatory Visit: Payer: Self-pay | Admitting: Internal Medicine

## 2023-07-02 NOTE — Telephone Encounter (Signed)
Requested medications are due for refill today.  yes  Requested medications are on the active medications list.  yes  Last refill. 05/24/2023 #30 0 rf  Future visit scheduled.   yes  Notes to clinic.  Refill not delegated.    Requested Prescriptions  Pending Prescriptions Disp Refills   traMADol (ULTRAM) 50 MG tablet [Pharmacy Med Name: TRAMADOL HCL 50 MG TAB] 30 tablet     Sig: TAKE 1 TABLET BY MOUTH DAILY AS NEEDED     Not Delegated - Analgesics:  Opioid Agonists Failed - 06/30/2023 10:23 AM      Failed - This refill cannot be delegated      Failed - Urine Drug Screen completed in last 360 days      Passed - Valid encounter within last 3 months    Recent Outpatient Visits           1 month ago Encounter for general adult medical examination with abnormal findings   [place] [facility] [place], [name], NP   2 months ago Other fatigue   [place] [facility] [place], [name], NP   3 months ago Mass of left testicle   [place] [facility] [place], [name], NP   5 months ago Chronic right shoulder pain   [place] [facility] [place], [name], NP   8 months ago Stage 3a chronic kidney disease [facility])   [place] [facility] [place], [name], NP       Future Appointments             In 4 months Baity, [name], NP [place] [facility], [facility]

## 2023-07-03 NOTE — Telephone Encounter (Signed)
Requested medication (s) are due for refill today: No  Requested medication (s) are on the active medication list: Yes  Last refill:  07/03/23  Future visit scheduled:   Notes to clinic:  Refilled today - not delegated.    Requested Prescriptions  Pending Prescriptions Disp Refills   traMADol (ULTRAM) 50 MG tablet [Pharmacy Med Name: TRAMADOL HCL 50 MG TAB] 30 tablet     Sig: TAKE 1 TABLET EVERY DAY AS NEEDED     Not Delegated - Analgesics:  Opioid Agonists Failed - 07/02/2023  1:04 PM      Failed - This refill cannot be delegated      Failed - Urine Drug Screen completed in last 360 days      Passed - Valid encounter within last 3 months    Recent Outpatient Visits           1 month ago Encounter for general adult medical examination with abnormal findings   [place] [facility] [place], [name], NP   2 months ago Other fatigue   [place] [facility] [place], [name], NP   3 months ago Mass of left testicle   [place] [facility] [place], [name], NP   5 months ago Chronic right shoulder pain   [place] [facility] [place], [name], NP   8 months ago Stage 3a chronic kidney disease [facility])   [place] [facility] [place], [name], NP       Future Appointments             In 4 months Baity, [name], NP [place] [facility], [facility]

## 2023-07-06 MED ORDER — ARIPIPRAZOLE 2 MG PO TABS: 2.0000 mg | ORAL_TABLET | Freq: Every day | ORAL | 0 refills | Status: DC

## 2023-07-06 NOTE — Addendum Note (Signed)
Addended by: Lorre Munroe on: 07/06/2023 08:01 AM   Modules accepted: Orders

## 2023-07-10 ENCOUNTER — Ambulatory Visit: Payer: Medicare HMO | Admitting: Podiatry

## 2023-07-10 DIAGNOSIS — M722 Plantar fascial fibromatosis: Secondary | ICD-10-CM

## 2023-07-10 DIAGNOSIS — M62462 Contracture of muscle, left lower leg: Secondary | ICD-10-CM

## 2023-07-10 NOTE — Progress Notes (Signed)
Subjective:  Patient ID: Corey Bell, male    DOB: [birth year],  MRN: [id number]  Chief Complaint  Patient presents with   Foot Pain    57 y.o. male presents with the above complaint.  Patient presents with complaint of left Planter fasciitis painful to touch is progressive gotten worse worse with ambulation worse with pressure he would like to discuss treatment options for it he came out of nowhere.  He wears new balance shoes.   Review of Systems: Negative except as noted in the HPI. Denies N/V/F/Ch.  Past Medical History:  Diagnosis Date   Anxiety    Aphthous ulcer 10/04/2012   improving   Arthritis    left index fnger   Carpal tunnel syndrome, bilateral    damaged nerve in neck   Chronic pain syndrome    COPD (chronic obstructive pulmonary disease) (HCC)    mild   Depression    Diverticulitis of colon    Esophageal reflux    H/O acute prostatitis    Hypothyroidism    Insomnia, unspecified    Mixed hyperlipidemia    Peyronie disease 12/20/2012   PER uROLOGY   Seasonal allergies    Sleep apnea    uses cpap   Tympanic membrane perforation    history of    Current Outpatient Medications:    ARIPiprazole (ABILIFY) 2 MG tablet, Take 1 tablet (2 mg total) by mouth daily., Disp: 30 tablet, Rfl: 0   blood glucose meter kit and supplies, Dispense based on patient and insurance preference. Use up to four times daily as directed. (FOR ICD-10 E10.9, E11.9)., Disp: 1 each, Rfl: 0   buPROPion (WELLBUTRIN XL) 300 MG 24 hr tablet, TAKE 1 TABLET BY MOUTH DAILY, Disp: 90 tablet, Rfl: 1   calcium carbonate (TUMS - DOSED IN MG ELEMENTAL CALCIUM) 500 MG chewable tablet, Chew 3 tablets by mouth daily., Disp: , Rfl:    citalopram (CELEXA) 40 MG tablet, Take 1 tablet (40 mg total) by mouth daily., Disp: 90 tablet, Rfl: 1   clonazePAM (KLONOPIN) 0.5 MG tablet, Take 1 tablet (0.5 mg total) by mouth daily as needed. for anxiety, Disp: 20 tablet, Rfl: 0   cyclobenzaprine (FLEXERIL) 10 MG  tablet, Take 1 tablet (10 mg total) by mouth daily as needed for muscle spasms., Disp: 30 tablet, Rfl: 0   diphenhydrAMINE (BENADRYL) 25 mg capsule, Take 50 mg by mouth at bedtime., Disp: , Rfl:    erythromycin with ethanol (THERAMYCIN) 2 % external solution, APPLY TOPICALLY TWICE A DAY AS NEEDED, Disp: 60 mL, Rfl: 0   Evolocumab (REPATHA SURECLICK) 140 MG/ML SOAJ, Inject 140 mg into the skin every 14 (fourteen) days., Disp: 2 mL, Rfl: 11   ezetimibe (ZETIA) 10 MG tablet, TAKE 1 TABLET BY MOUTH DAILY, Disp: 90 tablet, Rfl: 0   fexofenadine (ALLEGRA) 180 MG tablet, Take 180 mg by mouth daily., Disp: , Rfl:    fluticasone (FLONASE) 50 MCG/ACT nasal spray, Place 2 sprays into both nostrils daily., Disp: 16 g, Rfl: 5   hydrOXYzine (VISTARIL) 25 MG capsule, Take 1 capsule (25 mg total) by mouth every 4 (four) hours as needed., Disp: 90 capsule, Rfl: 0   levothyroxine (SYNTHROID) 75 MCG tablet, Take 1 tablet (75 mcg total) by mouth daily., Disp: 90 tablet, Rfl: 1   methocarbamol (ROBAXIN) 500 MG tablet, Take 1 tablet (500 mg total) by mouth every 8 (eight) hours as needed for muscle spasms., Disp: 270 tablet, Rfl: 0   montelukast (SINGULAIR) 10 MG  tablet, Take 1 tablet (10 mg total) by mouth at bedtime., Disp: 90 tablet, Rfl: 1   ondansetron (ZOFRAN-ODT) 4 MG disintegrating tablet, Take 1 tablet (4 mg total) by mouth every 8 (eight) hours as needed., Disp: 20 tablet, Rfl: 0   pantoprazole (PROTONIX) 40 MG tablet, Take 1 tablet (40 mg total) by mouth daily., Disp: 90 tablet, Rfl: 1   pregabalin (LYRICA) 150 MG capsule, Take 1 capsule (150 mg total) by mouth 2 (two) times daily., Disp: 60 capsule, Rfl: 0   Pseudoephedrine-guaiFENesin (MUCINEX D MAX STRENGTH PO), Take 1 tablet by mouth every 12 (twelve) hours., Disp: , Rfl:    sodium chloride (OCEAN) 0.65 % SOLN nasal spray, Place 1 spray into both nostrils as needed for congestion., Disp: 15 mL, Rfl: 0   tamsulosin (FLOMAX) 0.4 MG CAPS capsule, Take 2  capsules (0.8 mg total) by mouth at bedtime., Disp: 180 capsule, Rfl: 1   tirzepatide (MOUNJARO) 2.5 MG/0.5ML Pen, Inject 2.5 mg into the skin once a week., Disp: 2 mL, Rfl: 0   traMADol (ULTRAM) 50 MG tablet, TAKE 1 TABLET BY MOUTH DAILY AS NEEDED, Disp: 30 tablet, Rfl: 0   Vitamin D, Ergocalciferol, (DRISDOL) 1.25 MG (50000 UNIT) CAPS capsule, Take 1 capsule (50,000 Units total) by mouth once a week. For 12 weeks. Then start OTC Vitamin D3 2,000 unit daily., Disp: 12 capsule, Rfl: 0  Social History   Tobacco Use  Smoking Status Former   Current packs/day: 0.00   Average packs/day: 2.0 packs/day for 20.0 years (40.0 ttl pk-yrs)   Types: Cigarettes   Start date: 10/09/1986   Quit date: 10/09/2006   Years since quitting: 16.7  Smokeless Tobacco Never    Allergies  Allergen Reactions   Augmentin [Amoxicillin-Pot Clavulanate] Diarrhea   Demerol [Meperidine] Other (See Comments)    "goes crazy and has the strength of 10 men"   Egg-Derived Products Diarrhea and Nausea And Vomiting   Sulfamethoxazole Other (See Comments)    Blisters form in mouth   Statins Rash   Sulfa Antibiotics Rash   Sulfacetamide Sodium Rash   Objective:  There were no vitals filed for this visit. There is no height or weight on file to calculate BMI. Constitutional Well developed. Well nourished.  Vascular Dorsalis pedis pulses palpable bilaterally. Posterior tibial pulses palpable bilaterally. Capillary refill normal to all digits.  No cyanosis or clubbing noted. Pedal hair growth normal.  Neurologic Normal speech. Oriented to person, place, and time. Epicritic sensation to light touch grossly present bilaterally.  Dermatologic Nails well groomed and normal in appearance. No open wounds. No skin lesions.  Orthopedic: Pain on palpation of the left calcaneal tuber pain with dorsiflexion of the digit.  Positive Silfverskiold test noted with gastrocnemius equinus.   Radiographs: None Assessment:   1.  Plantar fasciitis of left foot   2. Gastrocnemius equinus of left lower extremity    Plan:  Patient was evaluated and treated and all questions answered.  Plantar Fasciitis, left with underlying gastrocnemius equinus - XR reviewed as above.  - Educated on icing and stretching. Instructions given.  - Injection delivered to the plantar fascia as below. - DME: Plantar fascial brace dispensed to support the medial longitudinal arch of the foot and offload pressure from the heel and prevent arch collapse during weightbearing - Pharmacologic management: None  Procedure: Injection Tendon/Ligament Location: Left plantar fascia at the glabrous junction; medial approach. Skin Prep: alcohol Injectate: 0.5 cc 0.5% marcaine plain, 0.5 cc of 1% Lidocaine, 0.5  cc kenalog 10. Disposition: Patient tolerated procedure well. Injection site dressed with a band-aid.

## 2023-07-17 ENCOUNTER — Ambulatory Visit (INDEPENDENT_AMBULATORY_CARE_PROVIDER_SITE_OTHER): Payer: Medicare HMO | Admitting: Internal Medicine

## 2023-07-17 ENCOUNTER — Encounter: Payer: Self-pay | Admitting: Internal Medicine

## 2023-07-17 VITALS — BP 136/78 | HR 58 | Temp 95.7°F | Wt 281.0 lb

## 2023-07-17 DIAGNOSIS — R350 Frequency of micturition: Secondary | ICD-10-CM

## 2023-07-17 DIAGNOSIS — N50812 Left testicular pain: Secondary | ICD-10-CM

## 2023-07-17 DIAGNOSIS — R1032 Left lower quadrant pain: Secondary | ICD-10-CM | POA: Diagnosis not present

## 2023-07-17 DIAGNOSIS — R109 Unspecified abdominal pain: Secondary | ICD-10-CM

## 2023-07-17 DIAGNOSIS — L723 Sebaceous cyst: Secondary | ICD-10-CM

## 2023-07-17 DIAGNOSIS — F4323 Adjustment disorder with mixed anxiety and depressed mood: Secondary | ICD-10-CM | POA: Diagnosis not present

## 2023-07-17 LAB — POCT URINALYSIS DIPSTICK
Bilirubin, UA: NEGATIVE
Blood, UA: NEGATIVE
Glucose, UA: NEGATIVE
Ketones, UA: NEGATIVE
Leukocytes, UA: NEGATIVE
Nitrite, UA: NEGATIVE
Protein, UA: NEGATIVE
Spec Grav, UA: 1.01 (ref 1.010–1.025)
Urobilinogen, UA: 0.2 U/dL
pH, UA: 6.5 (ref 5.0–8.0)

## 2023-07-17 NOTE — Patient Instructions (Signed)
Epidermoid Cyst  An epidermoid cyst, also called an epidermal cyst, is a small lump under your skin. The cyst contains a substance called keratin. Do not try to pop or open the cyst yourself. What are the causes? A blocked hair follicle. A hair that curls and re-enters the skin instead of growing straight out of the skin. A blocked pore. Irritated skin. An injury to the skin. Certain conditions that are passed along from parent to child. Human papillomavirus (HPV). This happens rarely when cysts occur on the bottom of the feet. Long-term sun damage to the skin. What increases the risk? Having acne. Being male. Having an injury to the skin. Being past puberty. Having certain conditions caused by genes (genetic disorder) What are the signs or symptoms? These cysts are usually harmless, but they can get infected. Symptoms of infection may include: Redness. Inflammation. Tenderness. Warmth. Fever. A bad-smelling substance that drains from the cyst. Pus that drains from the cyst. How is this treated? In many cases, epidermoid cysts go away on their own without treatment. If a cyst becomes infected, treatment may include: Opening and draining the cyst, done by a doctor. After draining, you may need minor surgery to remove the rest of the cyst. Antibiotic medicine. Shots of medicines (steroids) that help to reduce inflammation. Surgery to remove the cyst. Surgery may be done if the cyst: Becomes large. Bothers you. Has a chance of turning into cancer. Do not try to open a cyst yourself. Follow these instructions at home: Medicines Take over-the-counter and prescription medicines as told by your doctor. If you were prescribed an antibiotic medicine, take it as told by your doctor. Do not stop taking it even if you start to feel better. General instructions Keep the area around your cyst clean and dry. Wear loose, dry clothing. Avoid touching your cyst. Check your cyst every day  for signs of infection. Check for: Redness, swelling, or pain. Fluid or blood. Warmth. Pus or a bad smell. Keep all follow-up visits. How is this prevented? Wear clean, dry, clothing. Avoid wearing tight clothing. Keep your skin clean and dry. Take showers or baths every day. Contact a doctor if: Your cyst has symptoms of infection. Your condition does not improve or gets worse. You have a cyst that looks different from other cysts you have had. You have a fever. Get help right away if: Redness spreads from the cyst into the area close by. Summary An epidermoid cyst is a small lump under your skin. If a cyst becomes infected, treatment may include surgery to open and drain the cyst, or to remove it. Take over-the-counter and prescription medicines only as told by your doctor. Contact a doctor if your condition is not improving or is getting worse. Keep all follow-up visits. This information is not intended to replace advice given to you by your health care provider. Make sure you discuss any questions you have with your health care provider. Document Revised: 12/30/2019 Document Reviewed: 12/31/2019 Elsevier Patient Education  2024 [employer].

## 2023-07-17 NOTE — Progress Notes (Addendum)
Subjective:    Patient ID: Corey Bell, male    DOB: Mar 06, 1966, 57 y.o.   MRN: [id number]  HPI  Discussed the use of AI scribe software for clinical note transcription with the patient, who gave verbal consent to proceed.  History of Present Illness   The patient presents with a chief complaint of suspected prostate infection, characterized by pain originating in the left testicle, radiating to the abdomen and back. The pain, described as sore and achy, started approximately a week ago and has been consistent since onset. The patient reports increased urinary frequency, particularly at night, which has been disruptive to his sleep. There is no reported dysuria, hematuria, or discharge. The patient notes a possible swelling in the testicle over the weekend, but this has since resolved.  The patient has been managing the pain with over-the-counter ibuprofen and tramadol, which he reports as being effective. The pain is exacerbated by sitting, particularly in a reclining position, and is relieved during urination, except for the end of the stream. The patient is currently on flomax for an existing prostate condition and has a history of varicoceles, diagnosed a month and a half ago. He follows with urology.  In addition to the primary complaint, the patient also mentions a long-standing skin lesion on the belt line, which he describes as a possible wart or sebaceous cyst. The lesion does not cause discomfort or irritation, even with the pressure from a belt.   He would also like to provide an update on his anxiety and depression. He is currently weaning off citalopram and starting abilify. He plans to continue bupropion and clonazepam. Overall, he reports a significant improvement in his mood. He is not currently seeing a therapist. He denies SI/HI.       Review of Systems  Past Medical History:  Diagnosis Date   Anxiety    Aphthous ulcer 10/04/2012   improving   Arthritis    left  index fnger   Carpal tunnel syndrome, bilateral    damaged nerve in neck   Chronic pain syndrome    COPD (chronic obstructive pulmonary disease) (HCC)    mild   Depression    Diverticulitis of colon    Esophageal reflux    H/O acute prostatitis    Hypothyroidism    Insomnia, unspecified    Mixed hyperlipidemia    Peyronie disease 12/20/2012   PER uROLOGY   Seasonal allergies    Sleep apnea    uses cpap   Tympanic membrane perforation    history of    Current Outpatient Medications  Medication Sig Dispense Refill   ARIPiprazole (ABILIFY) 2 MG tablet Take 1 tablet (2 mg total) by mouth daily. 30 tablet 0   blood glucose meter kit and supplies Dispense based on patient and insurance preference. Use up to four times daily as directed. (FOR ICD-10 E10.9, E11.9). 1 each 0   buPROPion (WELLBUTRIN XL) 300 MG 24 hr tablet TAKE 1 TABLET BY MOUTH DAILY 90 tablet 1   calcium carbonate (TUMS - DOSED IN MG ELEMENTAL CALCIUM) 500 MG chewable tablet Chew 3 tablets by mouth daily.     citalopram (CELEXA) 40 MG tablet Take 1 tablet (40 mg total) by mouth daily. 90 tablet 1   clonazePAM (KLONOPIN) 0.5 MG tablet Take 1 tablet (0.5 mg total) by mouth daily as needed. for anxiety 20 tablet 0   cyclobenzaprine (FLEXERIL) 10 MG tablet Take 1 tablet (10 mg total) by mouth daily as needed for  muscle spasms. 30 tablet 0   diphenhydrAMINE (BENADRYL) 25 mg capsule Take 50 mg by mouth at bedtime.     erythromycin with ethanol (THERAMYCIN) 2 % external solution APPLY TOPICALLY TWICE A DAY AS NEEDED 60 mL 0   Evolocumab (REPATHA SURECLICK) 140 MG/ML SOAJ Inject 140 mg into the skin every 14 (fourteen) days. 2 mL 11   ezetimibe (ZETIA) 10 MG tablet TAKE 1 TABLET BY MOUTH DAILY 90 tablet 0   fexofenadine (ALLEGRA) 180 MG tablet Take 180 mg by mouth daily.     fluticasone (FLONASE) 50 MCG/ACT nasal spray Place 2 sprays into both nostrils daily. 16 g 5   hydrOXYzine (VISTARIL) 25 MG capsule Take 1 capsule (25 mg  total) by mouth every 4 (four) hours as needed. 90 capsule 0   levothyroxine (SYNTHROID) 75 MCG tablet Take 1 tablet (75 mcg total) by mouth daily. 90 tablet 1   methocarbamol (ROBAXIN) 500 MG tablet Take 1 tablet (500 mg total) by mouth every 8 (eight) hours as needed for muscle spasms. 270 tablet 0   montelukast (SINGULAIR) 10 MG tablet Take 1 tablet (10 mg total) by mouth at bedtime. 90 tablet 1   ondansetron (ZOFRAN-ODT) 4 MG disintegrating tablet Take 1 tablet (4 mg total) by mouth every 8 (eight) hours as needed. 20 tablet 0   pantoprazole (PROTONIX) 40 MG tablet Take 1 tablet (40 mg total) by mouth daily. 90 tablet 1   pregabalin (LYRICA) 150 MG capsule Take 1 capsule (150 mg total) by mouth 2 (two) times daily. 60 capsule 0   Pseudoephedrine-guaiFENesin (MUCINEX D MAX STRENGTH PO) Take 1 tablet by mouth every 12 (twelve) hours.     sodium chloride (OCEAN) 0.65 % SOLN nasal spray Place 1 spray into both nostrils as needed for congestion. 15 mL 0   tamsulosin (FLOMAX) 0.4 MG CAPS capsule Take 2 capsules (0.8 mg total) by mouth at bedtime. 180 capsule 1   tirzepatide (MOUNJARO) 2.5 MG/0.5ML Pen Inject 2.5 mg into the skin once a week. 2 mL 0   traMADol (ULTRAM) 50 MG tablet TAKE 1 TABLET BY MOUTH DAILY AS NEEDED 30 tablet 0   Vitamin D, Ergocalciferol, (DRISDOL) 1.25 MG (50000 UNIT) CAPS capsule Take 1 capsule (50,000 Units total) by mouth once a week. For 12 weeks. Then start OTC Vitamin D3 2,000 unit daily. 12 capsule 0   No current facility-administered medications for this visit.    Allergies  Allergen Reactions   Augmentin [Amoxicillin-Pot Clavulanate] Diarrhea   Demerol [Meperidine] Other (See Comments)    "goes crazy and has the strength of 10 men"   Egg-Derived Products Diarrhea and Nausea And Vomiting   Sulfamethoxazole Other (See Comments)    Blisters form in mouth   Statins Rash   Sulfa Antibiotics Rash   Sulfacetamide Sodium Rash    Family History  Problem Relation Age  of Onset   Heart disease Mother    Arrhythmia Mother    Congestive Heart Failure Father    Heart disease Father    COPD Father    Heart disease Brother        stents at 98 yo   Lupus Brother    Bladder Cancer Paternal Uncle    Heart failure Paternal Grandmother    Kidney cancer Paternal Grandfather    Parkinsonism Neg Hx     Social History   Socioeconomic History   Marital status: Married    Spouse name: [name]   Number of children: 0   Years  of education: college   Highest education level: Associate degree: occupational, Scientist, product/process development, or vocational program  Occupational History   Occupation: disabled    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  Tobacco Use   Smoking status: Former    Current packs/day: 0.00    Average packs/day: 2.0 packs/day for 20.0 years (40.0 ttl pk-yrs)    Types: Cigarettes    Start date: 10/09/1986    Quit date: 10/09/2006    Years since quitting: 16.7   Smokeless tobacco: Never  Vaping Use   Vaping status: Never Used  Substance and Sexual Activity   Alcohol use: Yes    Alcohol/week: 1.0 standard drink of alcohol    Types: 1 Cans of beer per week    Comment: rarely   Drug use: No   Sexual activity: Yes    Birth control/protection: Surgical  Other Topics Concern   Not on file  Social History Narrative   12/16/19   From: [place]   Living: with wife [name], since 1990   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      Family: mother and brothers are nearby      Enjoys: record collection with 10,000 LPs - working on digitizing these, fixing up a 1968 mustang       Exercise: not currently   Diet: over-eating, tries to eat somewhat healthy - salad every other day      Safety   Seat belts: Yes    Guns: Yes  and secure   Safe in relationships: Yes    Social Determinants of Health   Financial Resource Strain: Low Risk  (01/05/2023)   Overall Financial Resource Strain (CARDIA)    Difficulty of Paying Living Expenses: Not hard at all  Food  Insecurity: No Food Insecurity (01/05/2023)   Hunger Vital Sign    Worried About Running Out of Food in the Last Year: Never true    Ran Out of Food in the Last Year: Never true  Transportation Needs: No Transportation Needs (01/05/2023)   PRAPARE - Administrator, Civil Service (Medical): No    Lack of Transportation (Non-Medical): No  Physical Activity: Not on file  Stress: Not on file  Social Connections: Unknown (01/05/2023)   Social Connection and Isolation Panel [NHANES]    Frequency of Communication with Friends and Family: More than three times a week    Frequency of Social Gatherings with Friends and Family: Once a week    Attends Religious Services: Patient declined    Database administrator or Organizations: Patient declined    Attends Engineer, structural: Not on file    Marital Status: Married  Catering manager Violence: Not on file     Constitutional: Denies fever, malaise, fatigue, headache or abrupt weight changes.  HEENT: Denies eye pain, eye redness, ear pain, ringing in the ears, wax buildup, runny nose, nasal congestion, bloody nose, or sore throat. Respiratory: Denies difficulty breathing, shortness of breath, cough or sputum production.   Cardiovascular: Denies chest pain, chest tightness, palpitations or swelling in the hands or feet.  Gastrointestinal: Patient reports left flank pain, left lower abdominal pain.  Denies bloating, constipation, diarrhea or blood in the stool.  GU: Patient reports urinary frequency and left testicular pain.  Denies urgency, pain with urination, burning sensation, blood in urine, odor or discharge. Musculoskeletal: Patient reports chronic neck and back pain.  Denies decrease in range of motion, difficulty with gait, or joint swelling.  Skin:  Patient reports skin lesion of right hip.  Denies redness, rashes, or ulcercations.  Neurological: Denies dizziness, difficulty with memory, difficulty with speech or problems  with balance and coordination.  Psych: Patient has a history of anxiety and depression.  Denies SI/HI.  No other specific complaints in a complete review of systems (except as listed in HPI above).     Objective:   Physical Exam   BP 136/78 (BP Location: Right Arm, Patient Position: Sitting, Cuff Size: Large)   Pulse (!) 58   Temp (!) 95.7 F (35.4 C) (Temporal)   Wt 281 lb (127.5 kg)   SpO2 99%   BMI 36.08 kg/m   Wt Readings from Last 3 Encounters:  05/24/23 289 lb (131.1 kg)  04/30/23 284 lb (128.8 kg)  03/20/23 282 lb (127.9 kg)    General: Appears his stated age, obese, in NAD. Skin: Sebaceous cyst noted at right hip at the belt line. HEENT: Head: normal shape and size; Eyes: sclera white, no icterus, conjunctiva pink, PERRLA and EOMs intact;  Cardiovascular: Bradycardic with normal rhythm. S1,S2 noted.  No murmur, rubs or gallops noted. Pulmonary/Chest: Normal effort and positive vesicular breath sounds. No respiratory distress. No wheezes, rales or ronchi noted.  Abdomen: Soft and nontender. Normal bowel sounds.  Left-sided CVA tenderness noted. GU: Deferred Musculoskeletal: Pain with palpation of the left paralumbar muscles.  Normal flexion and rotation of the spine.  No difficulty with gait.  Neurological: Alert and oriented. Coordination normal.  Psychiatric: Mood and affect normal. Behavior is normal. Judgment and thought content normal.    BMET    Component Value Date/Time   NA 140 05/24/2023 0935   NA 142 01/27/2018 0000   NA 138 12/26/2012 1936   K 4.2 05/24/2023 0935   K 3.9 12/26/2012 1936   CL 104 05/24/2023 0935   CL 106 12/26/2012 1936   CO2 27 05/24/2023 0935   CO2 24 12/26/2012 1936   GLUCOSE 160 (H) 05/24/2023 0935   GLUCOSE 124 (H) 12/26/2012 1936   BUN 18 05/24/2023 0935   BUN 19 01/27/2018 0000   BUN 22 (H) 12/26/2012 1936   CREATININE 1.23 05/24/2023 0935   CALCIUM 9.5 05/24/2023 0935   CALCIUM 8.4 (L) 12/26/2012 1936   GFRNONAA >60  06/02/2020 0431   GFRNONAA 58 (L) 12/26/2012 1936   GFRAA >60 06/02/2020 0431   GFRAA >60 12/26/2012 1936    Lipid Panel     Component Value Date/Time   CHOL 96 05/29/2023 0829   TRIG 240 (H) 05/29/2023 0829   HDL 40 05/29/2023 0829   CHOLHDL 2.4 05/29/2023 0829   VLDL 29.8 06/10/2020 0919   LDLCALC 26 05/29/2023 0829    CBC    Component Value Date/Time   WBC 5.5 10/23/2022 1413   RBC 5.09 10/23/2022 1413   HGB 16.3 10/23/2022 1413   HGB 15.2 12/26/2012 1936   HCT 46.8 10/23/2022 1413   HCT 44.6 12/26/2012 1936   PLT 230 10/23/2022 1413   PLT 218 12/26/2012 1936   MCV 91.9 10/23/2022 1413   MCV 94 12/26/2012 1936   MCH 32.0 10/23/2022 1413   MCHC 34.8 10/23/2022 1413   RDW 12.4 10/23/2022 1413   RDW 13.3 12/26/2012 1936   LYMPHSABS 1.9 06/02/2020 0431   MONOABS 0.4 06/02/2020 0431   EOSABS 0.2 06/02/2020 0431   BASOSABS 0.1 06/02/2020 0431    Hgb A1C Lab Results  Component Value Date   HGBA1C 7.0 (H) 05/29/2023  Assessment & Plan:   Assessment and Plan    Urinary Frequency, Left Flank Pain, LLQ Abdominal Pain, Testicular Pain Pain originating in the left testicle, radiating to the abdomen and back, with increased urinary frequency. No fever, chills, hematuria, or discharge. Urinalysis negative for infection. Pain is not consistent with musculoskeletal origin or hernia. -Refer to urologist for further evaluation. -Send urine for culture to rule out infection not initially detected on urinalysis. -Continue tramadol as needed for pain.  Sebaceous Cyst Noted on belt line, not causing discomfort or irritation. -No intervention needed at this time.   Anxiety and Depression Significant improvement with switching from citalopram to abilify. Plan to continue bupropion and clonazepam. Support offered.        RTC in 4 months, follow-up chronic conditions Nicki Reaper, NP

## 2023-07-18 ENCOUNTER — Encounter: Payer: Self-pay | Admitting: Internal Medicine

## 2023-07-18 LAB — URINE CULTURE
MICRO NUMBER:: 15569059
Result:: NO GROWTH
SPECIMEN QUALITY:: ADEQUATE

## 2023-07-18 MED ORDER — PREGABALIN 150 MG PO CAPS: 150.0000 mg | ORAL_CAPSULE | Freq: Two times a day (BID) | ORAL | 0 refills | Status: DC

## 2023-07-30 ENCOUNTER — Encounter: Payer: Self-pay | Admitting: Internal Medicine

## 2023-07-30 ENCOUNTER — Other Ambulatory Visit: Payer: Self-pay | Admitting: Internal Medicine

## 2023-07-30 MED ORDER — TRAMADOL HCL 50 MG PO TABS: 50.0000 mg | ORAL_TABLET | Freq: Every day | ORAL | 0 refills | Status: DC | PRN

## 2023-07-31 DIAGNOSIS — Z125 Encounter for screening for malignant neoplasm of prostate: Secondary | ICD-10-CM | POA: Diagnosis not present

## 2023-07-31 DIAGNOSIS — I861 Scrotal varices: Secondary | ICD-10-CM | POA: Diagnosis not present

## 2023-07-31 DIAGNOSIS — N5082 Scrotal pain: Secondary | ICD-10-CM | POA: Diagnosis not present

## 2023-07-31 DIAGNOSIS — R3911 Hesitancy of micturition: Secondary | ICD-10-CM | POA: Diagnosis not present

## 2023-07-31 NOTE — Telephone Encounter (Signed)
Requested Prescriptions  Pending Prescriptions Disp Refills   ezetimibe (ZETIA) 10 MG tablet [Pharmacy Med Name: EZETIMIBE 10 MG TAB] 90 tablet 3    Sig: TAKE 1 TABLET BY MOUTH DAILY     Cardiovascular:  Antilipid - Sterol Transport Inhibitors Failed - 07/30/2023  1:04 PM      Failed - AST in normal range and within 360 days    AST  Date Value Ref Range Status  05/24/2023 37 (H) 10 - 35 U/L Final   SGOT(AST)  Date Value Ref Range Status  12/18/2012 47 (H) 15 - 37 Unit/L Final         Failed - ALT in normal range and within 360 days    ALT  Date Value Ref Range Status  05/24/2023 53 (H) 9 - 46 U/L Final   SGPT (ALT)  Date Value Ref Range Status  12/18/2012 66 12 - 78 U/L Final         Failed - Lipid Panel in normal range within the last 12 months    Cholesterol  Date Value Ref Range Status  05/29/2023 96 <200 mg/dL Final   LDL Cholesterol (Calc)  Date Value Ref Range Status  05/29/2023 26 mg/dL (calc) Final    Comment:    Reference range: <100 . Desirable range <100 mg/dL for primary prevention;   <70 mg/dL for patients with CHD or diabetic patients  with > or = 2 CHD risk factors. Marland Kitchen LDL-C is now calculated using the Martin-Hopkins  calculation, which is a validated novel method providing  better accuracy than the Friedewald equation in the  estimation of LDL-C.  Horald Pollen et al. Lenox Ahr. 1610;960(45): 2061-2068  (http://education.[facility].com/faq/FAQ164)    Direct LDL  Date Value Ref Range Status  12/16/2019 135.0 mg/dL Final    Comment:    Optimal:  <100 mg/dLNear or Above Optimal:  100-129 mg/dLBorderline High:  130-159 mg/dLHigh:  160-189 mg/dLVery High:  >190 mg/dL   HDL  Date Value Ref Range Status  05/29/2023 40 > OR = 40 mg/dL Final   Triglycerides  Date Value Ref Range Status  05/29/2023 240 (H) <150 mg/dL Final    Comment:    . If a non-fasting specimen was collected, consider repeat triglyceride testing on a fasting specimen if  clinically indicated.  Perry Mount et al. J. of Clin. Lipidol. 2015;9:129-169. Marland Kitchen          Passed - Patient is not pregnant      Passed - Valid encounter within last 12 months    Recent Outpatient Visits           2 weeks ago Urine frequency   [facility] [place], [name], NP   2 months ago Encounter for general adult medical examination with abnormal findings   [place] [place], [name], NP   3 months ago Other fatigue   [facility] [place], [name], NP   4 months ago Mass of left testicle   [facility] [place], [name], NP   6 months ago Chronic right shoulder pain   [facility] [place], [name], NP       Future Appointments             In 3 months Baity, [name], NP [place] [facility]             ARIPiprazole (ABILIFY) 2  MG tablet [Pharmacy Med Name: ARIPIPRAZOLE 2 MG TAB] 30 tablet 0    Sig: TAKE 1 TABLET BY MOUTH DAILY     Not Delegated - Psychiatry:  Antipsychotics - Second Generation (Atypical) - aripiprazole Failed - 07/30/2023  1:04 PM      Failed - This refill cannot be delegated      Failed - Lipid Panel in normal range within the last 12 months    Cholesterol  Date Value Ref Range Status  05/29/2023 96 <200 mg/dL Final   LDL Cholesterol (Calc)  Date Value Ref Range Status  05/29/2023 26 mg/dL (calc) Final    Comment:    Reference range: <100 . Desirable range <100 mg/dL for primary prevention;   <70 mg/dL for patients with CHD or diabetic patients  with > or = 2 CHD risk factors. Marland Kitchen LDL-C is now calculated using the Martin-Hopkins  calculation, which is a validated novel method providing  better accuracy than the Friedewald equation in the  estimation of LDL-C.  Horald Pollen et al. Lenox Ahr. 2952;841(32): 2061-2068  (http://education.[facility].com/faq/FAQ164)    Direct LDL  Date  Value Ref Range Status  12/16/2019 135.0 mg/dL Final    Comment:    Optimal:  <100 mg/dLNear or Above Optimal:  100-129 mg/dLBorderline High:  130-159 mg/dLHigh:  160-189 mg/dLVery High:  >190 mg/dL   HDL  Date Value Ref Range Status  05/29/2023 40 > OR = 40 mg/dL Final   Triglycerides  Date Value Ref Range Status  05/29/2023 240 (H) <150 mg/dL Final    Comment:    . If a non-fasting specimen was collected, consider repeat triglyceride testing on a fasting specimen if clinically indicated.  Perry Mount et al. J. of Clin. Lipidol. 2015;9:129-169. .          Failed - CBC within normal limits and completed in the last 12 months    WBC  Date Value Ref Range Status  10/23/2022 5.5 3.8 - 10.8 Thousand/uL Final   RBC  Date Value Ref Range Status  10/23/2022 5.09 4.20 - 5.80 Million/uL Final   Hemoglobin  Date Value Ref Range Status  10/23/2022 16.3 13.2 - 17.1 g/dL Final   HGB  Date Value Ref Range Status  12/26/2012 15.2 13.0 - 18.0 g/dL Final   HCT  Date Value Ref Range Status  10/23/2022 46.8 38.5 - 50.0 % Final  12/26/2012 44.6 40.0 - 52.0 % Final   MCHC  Date Value Ref Range Status  10/23/2022 34.8 32.0 - 36.0 g/dL Final   [facility]  Date Value Ref Range Status  10/23/2022 32.0 27.0 - 33.0 pg Final   MCV  Date Value Ref Range Status  10/23/2022 91.9 80.0 - 100.0 fL Final  12/26/2012 94 80 - 100 fL Final   No results found for: "PLTCOUNTKUC", "LABPLAT", "POCPLA" RDW  Date Value Ref Range Status  10/23/2022 12.4 11.0 - 15.0 % Final  12/26/2012 13.3 11.5 - 14.5 % Final         Failed - CMP within normal limits and completed in the last 12 months    Albumin  Date Value Ref Range Status  06/02/2020 4.2 3.5 - 5.0 g/dL Final  44/10/270 4.2 3.5 - 5.5 g/dL Final  53/66/4403 3.9 3.4 - 5.0 g/dL Final   Alkaline Phosphatase  Date Value Ref Range Status  06/02/2020 63 38 - 126 U/L Final  12/18/2012 78 50 - 136 Unit/L Final   Alkaline phosphatase (APISO)  Date  Value Ref Range Status  05/24/2023  74 35 - 144 U/L Final   ALT  Date Value Ref Range Status  05/24/2023 53 (H) 9 - 46 U/L Final   SGPT (ALT)  Date Value Ref Range Status  12/18/2012 66 12 - 78 U/L Final   AST  Date Value Ref Range Status  05/24/2023 37 (H) 10 - 35 U/L Final   SGOT(AST)  Date Value Ref Range Status  12/18/2012 47 (H) 15 - 37 Unit/L Final   BUN  Date Value Ref Range Status  05/24/2023 18 7 - 25 mg/dL Final  41/32/4401 19 4 - 21 Final  12/26/2012 22 (H) 7 - 18 mg/dL Final   Calcium  Date Value Ref Range Status  05/24/2023 9.5 8.6 - 10.3 mg/dL Final   Calcium, Total  Date Value Ref Range Status  12/26/2012 8.4 (L) 8.5 - 10.1 mg/dL Final   CO2  Date Value Ref Range Status  05/24/2023 27 20 - 32 mmol/L Final   Co2  Date Value Ref Range Status  12/26/2012 24 21 - 32 mmol/L Final   Creat  Date Value Ref Range Status  05/24/2023 1.23 0.70 - 1.30 mg/dL Final   Creatinine,U  Date Value Ref Range Status  09/07/2014 133.2 mg/dL Final   Creatinine, Urine  Date Value Ref Range Status  10/23/2022 104 20 - 320 mg/dL Final   Glucose  Date Value Ref Range Status  01/27/2018 126  Final  12/26/2012 124 (H) 65 - 99 mg/dL Final   Glucose, Bld  Date Value Ref Range Status  05/24/2023 160 (H) 65 - 139 mg/dL Final    Comment:    .        Non-fasting reference interval .    POC Glucose  Date Value Ref Range Status  02/16/2015 144 (A) 70 - 99 mg/dl Final   Glucose-Capillary  Date Value Ref Range Status  01/12/2022 113 (H) 70 - 99 mg/dL Final    Comment:    Glucose reference range applies only to samples taken after fasting for at least 8 hours.   Potassium  Date Value Ref Range Status  05/24/2023 4.2 3.5 - 5.3 mmol/L Final  12/26/2012 3.9 3.5 - 5.1 mmol/L Final   Sodium  Date Value Ref Range Status  05/24/2023 140 135 - 146 mmol/L Final  01/27/2018 142 137 - 147 Final  12/26/2012 138 136 - 145 mmol/L Final   Total Bilirubin  Date Value Ref  Range Status  05/24/2023 1.6 (H) 0.2 - 1.2 mg/dL Final   Bilirubin,Total  Date Value Ref Range Status  12/18/2012 1.1 (H) 0.2 - 1.0 mg/dL Final   Bilirubin, Total  Date Value Ref Range Status  01/27/2018 1.2  Final   Bilirubin, Direct  Date Value Ref Range Status  10/26/2015 0.2 <=0.2 mg/dL Final   Indirect Bilirubin  Date Value Ref Range Status  10/26/2015 1.0 0.2 - 1.2 mg/dL Final   Protein  Date Value Ref Range Status  12/18/2012 Negative NEGATIVE Final   Protein, UA  Date Value Ref Range Status  07/17/2023 Negative Negative Final   Total Protein  Date Value Ref Range Status  05/24/2023 7.1 6.1 - 8.1 g/dL Final  02/72/5366 6.4 6.0 - 8.5 g/dL Final  44/12/4740 7.6 6.4 - 8.2 g/dL Final   EGFR (African American)  Date Value Ref Range Status  12/26/2012 >60  Final   GFR calc Af Amer  Date Value Ref Range Status  06/02/2020 >60 >60 mL/min Final   GFR  Date Value Ref Range  Status  12/16/2019 60.94 >60.00 mL/min Final   eGFR  Date Value Ref Range Status  05/24/2023 69 > OR = 60 mL/min/1.25m2 Final   EGFR (Non-African Amer.)  Date Value Ref Range Status  12/26/2012 58 (L)  Final    Comment:    eGFR values <33mL/min/1.73 m2 may be an indication of chronic kidney disease (CKD). Calculated eGFR is useful in patients with stable renal function. The eGFR calculation will not be reliable in acutely ill patients when serum creatinine is changing rapidly. It is not useful in  patients on dialysis. The eGFR calculation may not be applicable to patients at the low and high extremes of body sizes, pregnant women, and vegetarians.    GFR calc non Af Amer  Date Value Ref Range Status  06/02/2020 >60 >60 mL/min Final         Passed - TSH in normal range and within 360 days    TSH  Date Value Ref Range Status  05/29/2023 2.07 0.40 - 4.50 mIU/L Final         Passed - Completed PHQ-2 or PHQ-9 in the last 360 days      Passed - Last BP in normal range    BP  Readings from Last 1 Encounters:  07/17/23 136/78         Passed - Last Heart Rate in normal range    Pulse Readings from Last 1 Encounters:  07/17/23 (!) 58         Passed - Valid encounter within last 6 months    Recent Outpatient Visits           2 weeks ago Urine frequency   [place] [facility] [place], [name], NP   2 months ago Encounter for general adult medical examination with abnormal findings   [place] [facility] [place], [name], NP   3 months ago Other fatigue   [place] [facility] [place], [name], NP   4 months ago Mass of left testicle   [facility] [place], [name], NP   6 months ago Chronic right shoulder pain   [place] [facility] [place], [name], NP       Future Appointments             In 3 months Baity, [name], NP  [facility], [facility]

## 2023-07-31 NOTE — Telephone Encounter (Signed)
Requested medications are due for refill today.  yes  Requested medications are on the active medications list.  yes  Last refill. 07/06/2023 #30 0 rf  Future visit scheduled.   yes  Notes to clinic.  Refill not delegated.    Requested Prescriptions  Pending Prescriptions Disp Refills   ARIPiprazole (ABILIFY) 2 MG tablet [Pharmacy Med Name: ARIPIPRAZOLE 2 MG TAB] 30 tablet 0    Sig: TAKE 1 TABLET BY MOUTH DAILY     Not Delegated - Psychiatry:  Antipsychotics - Second Generation (Atypical) - aripiprazole Failed - 07/30/2023  1:04 PM      Failed - This refill cannot be delegated      Failed - Lipid Panel in normal range within the last 12 months    Cholesterol  Date Value Ref Range Status  05/29/2023 96 <200 mg/dL Final   LDL Cholesterol (Calc)  Date Value Ref Range Status  05/29/2023 26 mg/dL (calc) Final    Comment:    Reference range: <100 . Desirable range <100 mg/dL for primary prevention;   <70 mg/dL for patients with CHD or diabetic patients  with > or = 2 CHD risk factors. Marland Kitchen LDL-C is now calculated using the Martin-Hopkins  calculation, which is a validated novel method providing  better accuracy than the Friedewald equation in the  estimation of LDL-C.  Horald Pollen et al. Lenox Ahr. 8295;621(30): 2061-2068  (http://education.[facility].com/faq/FAQ164)    Direct LDL  Date Value Ref Range Status  12/16/2019 135.0 mg/dL Final    Comment:    Optimal:  <100 mg/dLNear or Above Optimal:  100-129 mg/dLBorderline High:  130-159 mg/dLHigh:  160-189 mg/dLVery High:  >190 mg/dL   HDL  Date Value Ref Range Status  05/29/2023 40 > OR = 40 mg/dL Final   Triglycerides  Date Value Ref Range Status  05/29/2023 240 (H) <150 mg/dL Final    Comment:    . If a non-fasting specimen was collected, consider repeat triglyceride testing on a fasting specimen if clinically indicated.  Perry Mount et al. J. of Clin. Lipidol. 2015;9:129-169. .          Failed - CBC within  normal limits and completed in the last 12 months    WBC  Date Value Ref Range Status  10/23/2022 5.5 3.8 - 10.8 Thousand/uL Final   RBC  Date Value Ref Range Status  10/23/2022 5.09 4.20 - 5.80 Million/uL Final   Hemoglobin  Date Value Ref Range Status  10/23/2022 16.3 13.2 - 17.1 g/dL Final   HGB  Date Value Ref Range Status  12/26/2012 15.2 13.0 - 18.0 g/dL Final   HCT  Date Value Ref Range Status  10/23/2022 46.8 38.5 - 50.0 % Final  12/26/2012 44.6 40.0 - 52.0 % Final   MCHC  Date Value Ref Range Status  10/23/2022 34.8 32.0 - 36.0 g/dL Final   [facility]  Date Value Ref Range Status  10/23/2022 32.0 27.0 - 33.0 pg Final   MCV  Date Value Ref Range Status  10/23/2022 91.9 80.0 - 100.0 fL Final  12/26/2012 94 80 - 100 fL Final   No results found for: "PLTCOUNTKUC", "LABPLAT", "POCPLA" RDW  Date Value Ref Range Status  10/23/2022 12.4 11.0 - 15.0 % Final  12/26/2012 13.3 11.5 - 14.5 % Final         Failed - CMP within normal limits and completed in the last 12 months    Albumin  Date Value Ref Range Status  06/02/2020 4.2 3.5 - 5.0 g/dL  Final  02/23/2014 4.2 3.5 - 5.5 g/dL Final  16/07/9603 3.9 3.4 - 5.0 g/dL Final   Alkaline Phosphatase  Date Value Ref Range Status  06/02/2020 63 38 - 126 U/L Final  12/18/2012 78 50 - 136 Unit/L Final   Alkaline phosphatase (APISO)  Date Value Ref Range Status  05/24/2023 74 35 - 144 U/L Final   ALT  Date Value Ref Range Status  05/24/2023 53 (H) 9 - 46 U/L Final   SGPT (ALT)  Date Value Ref Range Status  12/18/2012 66 12 - 78 U/L Final   AST  Date Value Ref Range Status  05/24/2023 37 (H) 10 - 35 U/L Final   SGOT(AST)  Date Value Ref Range Status  12/18/2012 47 (H) 15 - 37 Unit/L Final   BUN  Date Value Ref Range Status  05/24/2023 18 7 - 25 mg/dL Final  54/06/8118 19 4 - 21 Final  12/26/2012 22 (H) 7 - 18 mg/dL Final   Calcium  Date Value Ref Range Status  05/24/2023 9.5 8.6 - 10.3 mg/dL Final    Calcium, Total  Date Value Ref Range Status  12/26/2012 8.4 (L) 8.5 - 10.1 mg/dL Final   CO2  Date Value Ref Range Status  05/24/2023 27 20 - 32 mmol/L Final   Co2  Date Value Ref Range Status  12/26/2012 24 21 - 32 mmol/L Final   Creat  Date Value Ref Range Status  05/24/2023 1.23 0.70 - 1.30 mg/dL Final   Creatinine,U  Date Value Ref Range Status  09/07/2014 133.2 mg/dL Final   Creatinine, Urine  Date Value Ref Range Status  10/23/2022 104 20 - 320 mg/dL Final   Glucose  Date Value Ref Range Status  01/27/2018 126  Final  12/26/2012 124 (H) 65 - 99 mg/dL Final   Glucose, Bld  Date Value Ref Range Status  05/24/2023 160 (H) 65 - 139 mg/dL Final    Comment:    .        Non-fasting reference interval .    POC Glucose  Date Value Ref Range Status  02/16/2015 144 (A) 70 - 99 mg/dl Final   Glucose-Capillary  Date Value Ref Range Status  01/12/2022 113 (H) 70 - 99 mg/dL Final    Comment:    Glucose reference range applies only to samples taken after fasting for at least 8 hours.   Potassium  Date Value Ref Range Status  05/24/2023 4.2 3.5 - 5.3 mmol/L Final  12/26/2012 3.9 3.5 - 5.1 mmol/L Final   Sodium  Date Value Ref Range Status  05/24/2023 140 135 - 146 mmol/L Final  01/27/2018 142 137 - 147 Final  12/26/2012 138 136 - 145 mmol/L Final   Total Bilirubin  Date Value Ref Range Status  05/24/2023 1.6 (H) 0.2 - 1.2 mg/dL Final   Bilirubin,Total  Date Value Ref Range Status  12/18/2012 1.1 (H) 0.2 - 1.0 mg/dL Final   Bilirubin, Total  Date Value Ref Range Status  01/27/2018 1.2  Final   Bilirubin, Direct  Date Value Ref Range Status  10/26/2015 0.2 <=0.2 mg/dL Final   Indirect Bilirubin  Date Value Ref Range Status  10/26/2015 1.0 0.2 - 1.2 mg/dL Final   Protein  Date Value Ref Range Status  12/18/2012 Negative NEGATIVE Final   Protein, UA  Date Value Ref Range Status  07/17/2023 Negative Negative Final   Total Protein  Date Value  Ref Range Status  05/24/2023 7.1 6.1 - 8.1 g/dL Final  02/23/2014 6.4 6.0 - 8.5 g/dL Final  16/07/9603 7.6 6.4 - 8.2 g/dL Final   EGFR (African American)  Date Value Ref Range Status  12/26/2012 >60  Final   GFR calc Af Amer  Date Value Ref Range Status  06/02/2020 >60 >60 mL/min Final   GFR  Date Value Ref Range Status  12/16/2019 60.94 >60.00 mL/min Final   eGFR  Date Value Ref Range Status  05/24/2023 69 > OR = 60 mL/min/1.58m2 Final   EGFR (Non-African Amer.)  Date Value Ref Range Status  12/26/2012 58 (L)  Final    Comment:    eGFR values <106mL/min/1.73 m2 may be an indication of chronic kidney disease (CKD). Calculated eGFR is useful in patients with stable renal function. The eGFR calculation will not be reliable in acutely ill patients when serum creatinine is changing rapidly. It is not useful in  patients on dialysis. The eGFR calculation may not be applicable to patients at the low and high extremes of body sizes, pregnant women, and vegetarians.    GFR calc non Af Amer  Date Value Ref Range Status  06/02/2020 >60 >60 mL/min Final         Passed - TSH in normal range and within 360 days    TSH  Date Value Ref Range Status  05/29/2023 2.07 0.40 - 4.50 mIU/L Final         Passed - Completed PHQ-2 or PHQ-9 in the last 360 days      Passed - Last BP in normal range    BP Readings from Last 1 Encounters:  07/17/23 136/78         Passed - Last Heart Rate in normal range    Pulse Readings from Last 1 Encounters:  07/17/23 (!) 58         Passed - Valid encounter within last 6 months    Recent Outpatient Visits           2 weeks ago Urine frequency   [place] [facility] [place], [name], NP   2 months ago Encounter for general adult medical examination with abnormal findings   [place] [facility] [place], [name], NP   3 months ago Other fatigue   [place] [facility] [place], [name], NP    4 months ago Mass of left testicle   [place] [facility] [place], [name], NP   6 months ago Chronic right shoulder pain   [place] [facility] [place], [name], NP       Future Appointments             In 3 months Baity, [name], NP [facility] [place], [place]            Signed Prescriptions Disp Refills   ezetimibe (ZETIA) 10 MG tablet 90 tablet 3    Sig: TAKE 1 TABLET BY MOUTH DAILY     Cardiovascular:  Antilipid - Sterol Transport Inhibitors Failed - 07/30/2023  1:04 PM      Failed - AST in normal range and within 360 days    AST  Date Value Ref Range Status  05/24/2023 37 (H) 10 - 35 U/L Final   SGOT(AST)  Date Value Ref Range Status  12/18/2012 47 (H) 15 - 37 Unit/L Final         Failed - ALT in normal range and within 360 days  ALT  Date Value Ref Range Status  05/24/2023 53 (H) 9 - 46 U/L Final   SGPT (ALT)  Date Value Ref Range Status  12/18/2012 66 12 - 78 U/L Final         Failed - Lipid Panel in normal range within the last 12 months    Cholesterol  Date Value Ref Range Status  05/29/2023 96 <200 mg/dL Final   LDL Cholesterol (Calc)  Date Value Ref Range Status  05/29/2023 26 mg/dL (calc) Final    Comment:    Reference range: <100 . Desirable range <100 mg/dL for primary prevention;   <70 mg/dL for patients with CHD or diabetic patients  with > or = 2 CHD risk factors. Marland Kitchen LDL-C is now calculated using the Martin-Hopkins  calculation, which is a validated novel method providing  better accuracy than the Friedewald equation in the  estimation of LDL-C.  Horald Pollen et al. Lenox Ahr. 5956;387(56): 2061-2068  (http://education.[facility].com/faq/FAQ164)    Direct LDL  Date Value Ref Range Status  12/16/2019 135.0 mg/dL Final    Comment:    Optimal:  <100 mg/dLNear or Above Optimal:  100-129 mg/dLBorderline High:  130-159 mg/dLHigh:  160-189 mg/dLVery High:  >190 mg/dL   HDL  Date  Value Ref Range Status  05/29/2023 40 > OR = 40 mg/dL Final   Triglycerides  Date Value Ref Range Status  05/29/2023 240 (H) <150 mg/dL Final    Comment:    . If a non-fasting specimen was collected, consider repeat triglyceride testing on a fasting specimen if clinically indicated.  Perry Mount et al. J. of Clin. Lipidol. 2015;9:129-169. Marland Kitchen          Passed - Patient is not pregnant      Passed - Valid encounter within last 12 months    Recent Outpatient Visits           2 weeks ago Urine frequency   [place] [facility] [place], [name], NP   2 months ago Encounter for general adult medical examination with abnormal findings   [place] [facility] [place], [name], NP   3 months ago Other fatigue   [place] [facility] [place], [name], NP   4 months ago Mass of left testicle   [facility] [place], [name], NP   6 months ago Chronic right shoulder pain   [place] [facility] [place], [name], NP       Future Appointments             In 3 months Baity, [name], NP [place] [facility], [facility]

## 2023-08-07 ENCOUNTER — Ambulatory Visit: Payer: Medicare HMO | Admitting: Podiatry

## 2023-08-07 ENCOUNTER — Encounter: Payer: Self-pay | Admitting: Podiatry

## 2023-08-07 VITALS — BP 159/81 | HR 57

## 2023-08-07 DIAGNOSIS — M722 Plantar fascial fibromatosis: Secondary | ICD-10-CM | POA: Diagnosis not present

## 2023-08-07 NOTE — Progress Notes (Signed)
Subjective:  Patient ID: Corey Bell, male    DOB: 1965-12-02,  MRN: [id number]  Chief Complaint  Patient presents with   Foot Pain    "It's a lot better."    57 y.o. male presents with the above complaint.  Patient present for follow-up of left plantar fasciitis.  He states is doing a lot better no further pain he has 0 out of 10 pain.  Denies any other acute complaints he bought new orthotics/insoles  Review of Systems: Negative except as noted in the HPI. Denies N/V/F/Ch.  Past Medical History:  Diagnosis Date   Anxiety    Aphthous ulcer 10/04/2012   improving   Arthritis    left index fnger   Carpal tunnel syndrome, bilateral    damaged nerve in neck   Chronic pain syndrome    COPD (chronic obstructive pulmonary disease) (HCC)    mild   Depression    Diverticulitis of colon    Esophageal reflux    H/O acute prostatitis    Hypothyroidism    Insomnia, unspecified    Mixed hyperlipidemia    Peyronie disease 12/20/2012   PER uROLOGY   Seasonal allergies    Sleep apnea    uses cpap   Tympanic membrane perforation    history of    Current Outpatient Medications:    ARIPiprazole (ABILIFY) 2 MG tablet, TAKE 1 TABLET BY MOUTH DAILY, Disp: 90 tablet, Rfl: 1   blood glucose meter kit and supplies, Dispense based on patient and insurance preference. Use up to four times daily as directed. (FOR ICD-10 E10.9, E11.9)., Disp: 1 each, Rfl: 0   buPROPion (WELLBUTRIN XL) 300 MG 24 hr tablet, TAKE 1 TABLET BY MOUTH DAILY, Disp: 90 tablet, Rfl: 1   calcium carbonate (TUMS - DOSED IN MG ELEMENTAL CALCIUM) 500 MG chewable tablet, Chew 3 tablets by mouth daily., Disp: , Rfl:    clonazePAM (KLONOPIN) 0.5 MG tablet, Take 1 tablet (0.5 mg total) by mouth daily as needed. for anxiety, Disp: 20 tablet, Rfl: 0   cyclobenzaprine (FLEXERIL) 10 MG tablet, Take 1 tablet (10 mg total) by mouth daily as needed for muscle spasms., Disp: 30 tablet, Rfl: 0   diphenhydrAMINE (BENADRYL) 25 mg  capsule, Take 50 mg by mouth at bedtime., Disp: , Rfl:    erythromycin with ethanol (THERAMYCIN) 2 % external solution, APPLY TOPICALLY TWICE A DAY AS NEEDED, Disp: 60 mL, Rfl: 0   ezetimibe (ZETIA) 10 MG tablet, TAKE 1 TABLET BY MOUTH DAILY, Disp: 90 tablet, Rfl: 3   fexofenadine (ALLEGRA) 180 MG tablet, Take 180 mg by mouth daily., Disp: , Rfl:    fluticasone (FLONASE) 50 MCG/ACT nasal spray, Place 2 sprays into both nostrils daily., Disp: 16 g, Rfl: 5   hydrOXYzine (VISTARIL) 25 MG capsule, Take 1 capsule (25 mg total) by mouth every 4 (four) hours as needed., Disp: 90 capsule, Rfl: 0   levothyroxine (SYNTHROID) 75 MCG tablet, Take 1 tablet (75 mcg total) by mouth daily., Disp: 90 tablet, Rfl: 1   methocarbamol (ROBAXIN) 500 MG tablet, Take 1 tablet (500 mg total) by mouth every 8 (eight) hours as needed for muscle spasms., Disp: 270 tablet, Rfl: 0   montelukast (SINGULAIR) 10 MG tablet, Take 1 tablet (10 mg total) by mouth at bedtime., Disp: 90 tablet, Rfl: 1   ondansetron (ZOFRAN-ODT) 4 MG disintegrating tablet, Take 1 tablet (4 mg total) by mouth every 8 (eight) hours as needed., Disp: 20 tablet, Rfl: 0   pantoprazole (  PROTONIX) 40 MG tablet, Take 1 tablet (40 mg total) by mouth daily., Disp: 90 tablet, Rfl: 1   pregabalin (LYRICA) 150 MG capsule, Take 1 capsule (150 mg total) by mouth 2 (two) times daily., Disp: 60 capsule, Rfl: 0   Pseudoephedrine-guaiFENesin (MUCINEX D MAX STRENGTH PO), Take 1 tablet by mouth every 12 (twelve) hours., Disp: , Rfl:    sodium chloride (OCEAN) 0.65 % SOLN nasal spray, Place 1 spray into both nostrils as needed for congestion., Disp: 15 mL, Rfl: 0   tamsulosin (FLOMAX) 0.4 MG CAPS capsule, Take 2 capsules (0.8 mg total) by mouth at bedtime., Disp: 180 capsule, Rfl: 1   traMADol (ULTRAM) 50 MG tablet, Take 1 tablet (50 mg total) by mouth daily as needed., Disp: 30 tablet, Rfl: 0   Vitamin D, Ergocalciferol, (DRISDOL) 1.25 MG (50000 UNIT) CAPS capsule, Take 1  capsule (50,000 Units total) by mouth once a week. For 12 weeks. Then start OTC Vitamin D3 2,000 unit daily., Disp: 12 capsule, Rfl: 0  Social History   Tobacco Use  Smoking Status Former   Current packs/day: 0.00   Average packs/day: 2.0 packs/day for 20.0 years (40.0 ttl pk-yrs)   Types: Cigarettes   Start date: 10/09/1986   Quit date: 10/09/2006   Years since quitting: 16.8  Smokeless Tobacco Never    Allergies  Allergen Reactions   Augmentin [Amoxicillin-Pot Clavulanate] Diarrhea   Demerol [Meperidine] Other (See Comments)    "goes crazy and has the strength of 10 men"   Egg-Derived Products Diarrhea and Nausea And Vomiting   Sulfamethoxazole Other (See Comments)    Blisters form in mouth   Statins Rash   Sulfa Antibiotics Rash   Sulfacetamide Sodium Rash   Objective:   Vitals:   08/07/23 0823  BP: (!) 159/81  Pulse: (!) 57   There is no height or weight on file to calculate BMI. Constitutional Well developed. Well nourished.  Vascular Dorsalis pedis pulses palpable bilaterally. Posterior tibial pulses palpable bilaterally. Capillary refill normal to all digits.  No cyanosis or clubbing noted. Pedal hair growth normal.  Neurologic Normal speech. Oriented to person, place, and time. Epicritic sensation to light touch grossly present bilaterally.  Dermatologic Nails well groomed and normal in appearance. No open wounds. No skin lesions.  Orthopedic: No further pain on palpation of the left calcaneal tuber pain with dorsiflexion of the digit.  Positive Silfverskiold test noted with gastrocnemius equinus.   Radiographs: None Assessment:   No diagnosis found.  Plan:  Patient was evaluated and treated and all questions answered.  Plantar Fasciitis, left with underlying gastrocnemius equinus -Clinically healed and officially discharged from my care if any foot and ankle issues arise future he will come back and see me.  I discussed shoe gear modification and  orthotics he has obtained power steps insoles.

## 2023-08-16 DIAGNOSIS — H524 Presbyopia: Secondary | ICD-10-CM | POA: Diagnosis not present

## 2023-08-16 DIAGNOSIS — E119 Type 2 diabetes mellitus without complications: Secondary | ICD-10-CM | POA: Diagnosis not present

## 2023-08-16 LAB — HM DIABETES EYE EXAM

## 2023-08-20 ENCOUNTER — Encounter: Payer: Self-pay | Admitting: Internal Medicine

## 2023-08-20 ENCOUNTER — Other Ambulatory Visit: Payer: Self-pay | Admitting: Internal Medicine

## 2023-08-21 NOTE — Telephone Encounter (Signed)
Requested medication (s) are due for refill today - yes  Requested medication (s) are on the active medication list -yes  Future visit scheduled -yes  Last refill: 07/18/23 #60  Notes to clinic: non delegated Rx  Requested Prescriptions  Pending Prescriptions Disp Refills   pregabalin (LYRICA) 150 MG capsule [Pharmacy Med Name: PREGABALIN 150 MG CAP] 60 capsule     Sig: TAKE 1 CAPSULE BY MOUTH TWICE DAILY     Not Delegated - Neurology:  Anticonvulsants - Controlled - pregabalin Failed - 08/20/2023  3:24 PM      Failed - This refill cannot be delegated      Passed - Cr in normal range and within 360 days    Creat  Date Value Ref Range Status  05/24/2023 1.23 0.70 - 1.30 mg/dL Final   Creatinine,U  Date Value Ref Range Status  09/07/2014 133.2 mg/dL Final   Creatinine, Urine  Date Value Ref Range Status  10/23/2022 104 20 - 320 mg/dL Final         Passed - Completed PHQ-2 or PHQ-9 in the last 360 days      Passed - Valid encounter within last 12 months    Recent Outpatient Visits           1 month ago Urine frequency   [facility] [place], [name], NP   2 months ago Encounter for general adult medical examination with abnormal findings   [place] [facility] [place], [name], NP   3 months ago Other fatigue   [place] [facility] [place], [name], NP   5 months ago Mass of left testicle   [place] [facility] [place], [name], NP   7 months ago Chronic right shoulder pain   [place] [facility] [place], [name], NP       Future Appointments             In 3 months Baity, [name], NP [place] [facility], [facility]               Requested Prescriptions  Pending Prescriptions Disp Refills   pregabalin (LYRICA) 150 MG capsule [Pharmacy Med Name: PREGABALIN 150 MG CAP] 60 capsule     Sig: TAKE 1 CAPSULE BY MOUTH TWICE DAILY     Not Delegated -  Neurology:  Anticonvulsants - Controlled - pregabalin Failed - 08/20/2023  3:24 PM      Failed - This refill cannot be delegated      Passed - Cr in normal range and within 360 days    Creat  Date Value Ref Range Status  05/24/2023 1.23 0.70 - 1.30 mg/dL Final   Creatinine,U  Date Value Ref Range Status  09/07/2014 133.2 mg/dL Final   Creatinine, Urine  Date Value Ref Range Status  10/23/2022 104 20 - 320 mg/dL Final         Passed - Completed PHQ-2 or PHQ-9 in the last 360 days      Passed - Valid encounter within last 12 months    Recent Outpatient Visits           1 month ago Urine frequency   [place] [facility] [place], [name], NP   2 months ago Encounter for general adult medical examination with abnormal findings   [facility] [place], [name], NP   3 months ago  Other fatigue   [place] [facility] [place], [name], NP   5 months ago Mass of left testicle   [facility] [place], [name], NP   7 months ago Chronic right shoulder pain   [place] [facility] [place], [name], NP       Future Appointments             In 3 months Baity, [name], NP [place] [facility], [place]

## 2023-09-18 ENCOUNTER — Other Ambulatory Visit: Payer: Self-pay | Admitting: Internal Medicine

## 2023-09-18 ENCOUNTER — Telehealth: Payer: Self-pay

## 2023-09-18 NOTE — Telephone Encounter (Signed)
Rexulti prior auth.   Corey Bell  (Key: [id number]) Rexulti 1MG  tablets

## 2023-09-19 NOTE — Telephone Encounter (Signed)
Requested medication (s) are due for refill today: yes   Requested medication (s) are on the active medication list: yes   Last refill:  08/22/23 #60 0 refills   Future visit scheduled: yes in 2 months   Notes to clinic:  not delegated per protocol. Do you want to refill Rx?     Requested Prescriptions  Pending Prescriptions Disp Refills   pregabalin (LYRICA) 150 MG capsule [Pharmacy Med Name: PREGABALIN 150 MG CAP] 60 capsule     Sig: TAKE 1 CAPSULE BY MOUTH TWICE DAILY     Not Delegated - Neurology:  Anticonvulsants - Controlled - pregabalin Failed - 09/18/2023 10:37 AM      Failed - This refill cannot be delegated      Passed - Cr in normal range and within 360 days    Creat  Date Value Ref Range Status  05/24/2023 1.23 0.70 - 1.30 mg/dL Final   Creatinine,U  Date Value Ref Range Status  09/07/2014 133.2 mg/dL Final   Creatinine, Urine  Date Value Ref Range Status  10/23/2022 104 20 - 320 mg/dL Final         Passed - Completed PHQ-2 or PHQ-9 in the last 360 days      Passed - Valid encounter within last 12 months    Recent Outpatient Visits           2 months ago Urine frequency   [place] [facility] [place], [name], NP   3 months ago Encounter for general adult medical examination with abnormal findings   [place] [facility] [place], [name], NP   4 months ago Other fatigue   [place] [facility] [place], [name], NP   6 months ago Mass of left testicle   [facility] [place], [name], NP   8 months ago Chronic right shoulder pain   [place] [facility] [place], [name], NP       Future Appointments             In 2 months Baity, [name], NP [place] [facility], [facility]

## 2023-09-23 ENCOUNTER — Encounter: Payer: Self-pay | Admitting: Internal Medicine

## 2023-09-25 MED ORDER — METHOCARBAMOL 500 MG PO TABS: 500.0000 mg | ORAL_TABLET | Freq: Three times a day (TID) | ORAL | 0 refills | Status: DC | PRN

## 2023-09-25 NOTE — Progress Notes (Signed)
Refill for robaxin was sent to pharmacy.

## 2023-10-18 ENCOUNTER — Encounter: Payer: Self-pay | Admitting: Internal Medicine

## 2023-10-18 DIAGNOSIS — F320 Major depressive disorder, single episode, mild: Secondary | ICD-10-CM | POA: Diagnosis not present

## 2023-10-18 MED ORDER — PREGABALIN 150 MG PO CAPS: 150.0000 mg | ORAL_CAPSULE | Freq: Two times a day (BID) | ORAL | 0 refills | Status: DC

## 2023-11-02 ENCOUNTER — Other Ambulatory Visit: Payer: Self-pay | Admitting: Internal Medicine

## 2023-11-02 NOTE — Telephone Encounter (Signed)
Requested Prescriptions  Pending Prescriptions Disp Refills   buPROPion (WELLBUTRIN XL) 300 MG 24 hr tablet [Pharmacy Med Name: BUPROPION HCL ER (XL) 300 MG TAB] 90 tablet 0    Sig: TAKE 1 TABLET BY MOUTH DAILY     Psychiatry: Antidepressants - bupropion Failed - 11/02/2023 12:08 PM      Failed - AST in normal range and within 360 days    AST  Date Value Ref Range Status  05/24/2023 37 (H) 10 - 35 U/L Final   SGOT(AST)  Date Value Ref Range Status  12/18/2012 47 (H) 15 - 37 Unit/L Final         Failed - ALT in normal range and within 360 days    ALT  Date Value Ref Range Status  05/24/2023 53 (H) 9 - 46 U/L Final   SGPT (ALT)  Date Value Ref Range Status  12/18/2012 66 12 - 78 U/L Final         Failed - Last BP in normal range    BP Readings from Last 1 Encounters:  08/07/23 (!) 159/81         Passed - Cr in normal range and within 360 days    Creat  Date Value Ref Range Status  05/24/2023 1.23 0.70 - 1.30 mg/dL Final   Creatinine,U  Date Value Ref Range Status  09/07/2014 133.2 mg/dL Final   Creatinine, Urine  Date Value Ref Range Status  10/23/2022 104 20 - 320 mg/dL Final         Passed - Valid encounter within last 6 months    Recent Outpatient Visits           3 months ago Urine frequency   [place] [facility] [place], [name], NP   5 months ago Encounter for general adult medical examination with abnormal findings   [place] [facility] [place], [name], NP   6 months ago Other fatigue   [place] [facility] [place], [name], NP   7 months ago Mass of left testicle   [facility] [place], [name], NP   10 months ago Chronic right shoulder pain   [place] [facility] [place], [name], NP       Future Appointments             In 3 weeks [name], [name], NP [place] [facility], [facility]

## 2023-11-13 DIAGNOSIS — L820 Inflamed seborrheic keratosis: Secondary | ICD-10-CM | POA: Diagnosis not present

## 2023-11-13 DIAGNOSIS — L720 Epidermal cyst: Secondary | ICD-10-CM | POA: Diagnosis not present

## 2023-11-13 DIAGNOSIS — L821 Other seborrheic keratosis: Secondary | ICD-10-CM | POA: Diagnosis not present

## 2023-11-13 DIAGNOSIS — D225 Melanocytic nevi of trunk: Secondary | ICD-10-CM | POA: Diagnosis not present

## 2023-11-13 DIAGNOSIS — L738 Other specified follicular disorders: Secondary | ICD-10-CM | POA: Diagnosis not present

## 2023-11-15 DIAGNOSIS — F320 Major depressive disorder, single episode, mild: Secondary | ICD-10-CM | POA: Diagnosis not present

## 2023-11-19 ENCOUNTER — Other Ambulatory Visit: Payer: Self-pay | Admitting: Internal Medicine

## 2023-11-20 NOTE — Telephone Encounter (Signed)
Requested medication (s) are due for refill today: yes  Requested medication (s) are on the active medication list: yes  Last refill:  10/18/23 #60/0  Future visit scheduled: yes  Notes to clinic:  Unable to refill per protocol, cannot delegate.    Requested Prescriptions  Pending Prescriptions Disp Refills   pregabalin (LYRICA) 150 MG capsule [Pharmacy Med Name: PREGABALIN 150 MG CAP] 60 capsule     Sig: TAKE 1 CAPSULE BY MOUTH TWICE DAILY     Not Delegated - Neurology:  Anticonvulsants - Controlled - pregabalin Failed - 11/20/2023 12:40 PM      Failed - This refill cannot be delegated      Passed - Cr in normal range and within 360 days    Creat  Date Value Ref Range Status  05/24/2023 1.23 0.70 - 1.30 mg/dL Final   Creatinine,U  Date Value Ref Range Status  09/07/2014 133.2 mg/dL Final   Creatinine, Urine  Date Value Ref Range Status  10/23/2022 104 20 - 320 mg/dL Final         Passed - Completed PHQ-2 or PHQ-9 in the last 360 days      Passed - Valid encounter within last 12 months    Recent Outpatient Visits           4 months ago Urine frequency   [place] [facility] [place], [name], NP   6 months ago Encounter for general adult medical examination with abnormal findings   [place] [facility] [place], [name], NP   6 months ago Other fatigue   [place] [facility] [place], [name], NP   8 months ago Mass of left testicle   [place] [facility] [place], [name], NP   10 months ago Chronic right shoulder pain   [place] [facility] [place], [name], NP       Future Appointments             In 1 week [name], [name], NP [place] [facility], [place]            Signed Prescriptions Disp Refills   montelukast (SINGULAIR) 10 MG tablet 90 tablet 0    Sig: TAKE 1 TABLET BY MOUTH AT BEDTIME     Pulmonology:  Leukotriene Inhibitors Passed - 11/20/2023  12:40 PM      Passed - Valid encounter within last 12 months    Recent Outpatient Visits           4 months ago Urine frequency   [place] [facility], [name], NP   6 months ago Encounter for general adult medical examination with abnormal findings   [place] [facility] [place], [name], NP   6 months ago Other fatigue   [place] [facility] [place], [name], NP   8 months ago Mass of left testicle   [facility] [place], [name], NP   10 months ago Chronic right shoulder pain   [place] [facility] [place], [name], NP       Future Appointments             In 1 week [name], [name], NP [name], [name]

## 2023-11-20 NOTE — Telephone Encounter (Signed)
Requested Prescriptions  Pending Prescriptions Disp Refills   pregabalin (LYRICA) 150 MG capsule [Pharmacy Med Name: PREGABALIN 150 MG CAP] 60 capsule     Sig: TAKE 1 CAPSULE BY MOUTH TWICE DAILY     Not Delegated - Neurology:  Anticonvulsants - Controlled - pregabalin Failed - 11/20/2023 12:39 PM      Failed - This refill cannot be delegated      Passed - Cr in normal range and within 360 days    Creat  Date Value Ref Range Status  05/24/2023 1.23 0.70 - 1.30 mg/dL Final   Creatinine,U  Date Value Ref Range Status  09/07/2014 133.2 mg/dL Final   Creatinine, Urine  Date Value Ref Range Status  10/23/2022 104 20 - 320 mg/dL Final         Passed - Completed PHQ-2 or PHQ-9 in the last 360 days      Passed - Valid encounter within last 12 months    Recent Outpatient Visits           4 months ago Urine frequency   [place] [facility] [place], [name], NP   6 months ago Encounter for general adult medical examination with abnormal findings   [place] [facility] [place], [name], NP   6 months ago Other fatigue   [place] [facility] [place], [name], NP   8 months ago Mass of left testicle   [place] [facility] [place], [name], NP   10 months ago Chronic right shoulder pain   [place] [facility] [place], [name], NP       Future Appointments             In 1 week [name], [name], NP [place] [facility]             montelukast (SINGULAIR) 10 MG tablet [Pharmacy Med Name: MONTELUKAST SODIUM 10 MG TAB] 90 tablet 0    Sig: TAKE 1 TABLET BY MOUTH AT BEDTIME     Pulmonology:  Leukotriene Inhibitors Passed - 11/20/2023 12:39 PM      Passed - Valid encounter within last 12 months    Recent Outpatient Visits           4 months ago Urine frequency   [place] [facility] [place], [name], NP   6 months ago Encounter for general adult  medical examination with abnormal findings   [place] [facility] [place], [name], NP   6 months ago Other fatigue   [place] [facility] [place], [name], NP   8 months ago Mass of left testicle   [place] [place], [name], NP   10 months ago Chronic right shoulder pain   [place] [facility] [place], [name], NP       Future Appointments             In 1 week [name], [name], NP  [facility], [facility]

## 2023-11-27 ENCOUNTER — Encounter: Payer: Self-pay | Admitting: Internal Medicine

## 2023-11-27 ENCOUNTER — Ambulatory Visit (INDEPENDENT_AMBULATORY_CARE_PROVIDER_SITE_OTHER): Payer: Medicare Other | Admitting: Internal Medicine

## 2023-11-27 VITALS — BP 130/78 | Ht 74.0 in | Wt 287.0 lb

## 2023-11-27 DIAGNOSIS — I10 Essential (primary) hypertension: Secondary | ICD-10-CM

## 2023-11-27 DIAGNOSIS — T466X5A Adverse effect of antihyperlipidemic and antiarteriosclerotic drugs, initial encounter: Secondary | ICD-10-CM

## 2023-11-27 DIAGNOSIS — F4323 Adjustment disorder with mixed anxiety and depressed mood: Secondary | ICD-10-CM

## 2023-11-27 DIAGNOSIS — M2550 Pain in unspecified joint: Secondary | ICD-10-CM

## 2023-11-27 DIAGNOSIS — G4733 Obstructive sleep apnea (adult) (pediatric): Secondary | ICD-10-CM | POA: Diagnosis not present

## 2023-11-27 DIAGNOSIS — E039 Hypothyroidism, unspecified: Secondary | ICD-10-CM

## 2023-11-27 DIAGNOSIS — E1169 Type 2 diabetes mellitus with other specified complication: Secondary | ICD-10-CM

## 2023-11-27 DIAGNOSIS — E119 Type 2 diabetes mellitus without complications: Secondary | ICD-10-CM

## 2023-11-27 DIAGNOSIS — K219 Gastro-esophageal reflux disease without esophagitis: Secondary | ICD-10-CM

## 2023-11-27 DIAGNOSIS — N1831 Chronic kidney disease, stage 3a: Secondary | ICD-10-CM

## 2023-11-27 DIAGNOSIS — E785 Hyperlipidemia, unspecified: Secondary | ICD-10-CM

## 2023-11-27 DIAGNOSIS — F4311 Post-traumatic stress disorder, acute: Secondary | ICD-10-CM

## 2023-11-27 DIAGNOSIS — M51372 Other intervertebral disc degeneration, lumbosacral region with discogenic back pain and lower extremity pain: Secondary | ICD-10-CM

## 2023-11-27 DIAGNOSIS — E66812 Obesity, class 2: Secondary | ICD-10-CM

## 2023-11-27 DIAGNOSIS — Z0289 Encounter for other administrative examinations: Secondary | ICD-10-CM

## 2023-11-27 DIAGNOSIS — N400 Enlarged prostate without lower urinary tract symptoms: Secondary | ICD-10-CM

## 2023-11-27 DIAGNOSIS — J449 Chronic obstructive pulmonary disease, unspecified: Secondary | ICD-10-CM

## 2023-11-27 DIAGNOSIS — M5030 Other cervical disc degeneration, unspecified cervical region: Secondary | ICD-10-CM

## 2023-11-27 MED ORDER — PREGABALIN 150 MG PO CAPS: 150.0000 mg | ORAL_CAPSULE | Freq: Two times a day (BID) | ORAL | 0 refills | Status: DC

## 2023-11-27 MED ORDER — MONTELUKAST SODIUM 10 MG PO TABS: 10.0000 mg | ORAL_TABLET | Freq: Every day | ORAL | 1 refills | Status: DC

## 2023-11-27 MED ORDER — CLONAZEPAM 0.5 MG PO TABS: 0.5000 mg | ORAL_TABLET | Freq: Every day | ORAL | 0 refills | Status: DC | PRN

## 2023-11-27 MED ORDER — PANTOPRAZOLE SODIUM 40 MG PO TBEC: 40.0000 mg | DELAYED_RELEASE_TABLET | Freq: Every day | ORAL | 1 refills | Status: DC

## 2023-11-27 MED ORDER — BUPROPION HCL ER (XL) 300 MG PO TB24: 300.0000 mg | ORAL_TABLET | Freq: Every day | ORAL | 0 refills | Status: DC

## 2023-11-27 MED ORDER — TRAMADOL HCL 50 MG PO TABS: 50.0000 mg | ORAL_TABLET | Freq: Every day | ORAL | 0 refills | Status: DC | PRN

## 2023-11-27 MED ORDER — METHOCARBAMOL 500 MG PO TABS: 500.0000 mg | ORAL_TABLET | Freq: Three times a day (TID) | ORAL | 0 refills | Status: DC | PRN

## 2023-11-27 MED ORDER — CYCLOBENZAPRINE HCL 10 MG PO TABS: 10.0000 mg | ORAL_TABLET | Freq: Every day | ORAL | 0 refills | Status: DC | PRN

## 2023-11-27 MED ORDER — HYDROXYZINE PAMOATE 25 MG PO CAPS: 25.0000 mg | ORAL_CAPSULE | ORAL | 1 refills | Status: DC | PRN

## 2023-11-27 NOTE — Assessment & Plan Note (Signed)
C-Met and lipid profile today Encouraged him to do a low-fat diet Continue ezetimibe

## 2023-11-27 NOTE — Assessment & Plan Note (Signed)
 Encourage diet and exercise for weight loss

## 2023-11-27 NOTE — Assessment & Plan Note (Signed)
 Continue Abilify, bupropion, hydroxyzine and clonazepam Support offered

## 2023-11-27 NOTE — Patient Instructions (Signed)

## 2023-11-27 NOTE — Assessment & Plan Note (Signed)
C-Met today 

## 2023-11-27 NOTE — Assessment & Plan Note (Signed)
C-Met and lipid profile today

## 2023-11-27 NOTE — Assessment & Plan Note (Signed)
 Continue pregabalin, methocarbamol, cyclobenzaprine and tramadol Encouraged regular stretching and core strengthening

## 2023-11-27 NOTE — Assessment & Plan Note (Signed)
Controlled off meds Reinforced DASH diet and exercise for weight loss 

## 2023-11-27 NOTE — Assessment & Plan Note (Signed)
 Avoid foods that trigger reflux Encourage weight loss as this can reduce reflux symptoms Continue pantoprazole and Tums as needed

## 2023-11-27 NOTE — Assessment & Plan Note (Signed)
 Continue Abilify, bupropion, hydroxyzine and clonazepam  Will have him start taking Abilify in the morning instead of night to see if this helps with insomnia support offered

## 2023-11-27 NOTE — Assessment & Plan Note (Signed)
 Will discontinue Flomax as he is currently taking saw palmetto

## 2023-11-27 NOTE — Assessment & Plan Note (Signed)
 Asymptomatic He does not smoke Not currently using any inhalers

## 2023-11-27 NOTE — Assessment & Plan Note (Signed)
Encourage weight loss as this can produce sleep apnea symptoms Continue CPAP use 

## 2023-11-27 NOTE — Assessment & Plan Note (Signed)
 TSH and free T4 today We will adjust levothyroxine if needed based on labs

## 2023-11-27 NOTE — Progress Notes (Signed)
 Subjective:    Patient ID: Corey Bell, male    DOB: Jun 18, 1966, 58 y.o.   MRN: [id number]  HPI  Patient presents to clinic today for follow-up of chronic conditions.  HTN: His BP today is 130/78.  He is not taking any antihypertensive medication at this time.  ECG from 10/2022 reviewed.  HLD: His last LDL was 26, triglycerides 657, 05/2023.  He is taking ezetimibe as prescribed. He is no longer taking repatha. He tries to consume a low-fat diet.  DM2: His last A1c was 7%, 05/2023.  He is not taking any oral diabetic medication at this time but he has been metformin in the past but stopped due to GI side effects.  He does not check his sugars routinely.  He does not check his feet routinely.  His last eye exam was 08/2023.  Flu never.  Pneumovax 06/2020.  COVID Moderna x 2.  GERD: Status post gastric sleeve.  He occasionally has breakthrough on pantoprazole for which he takes tums as needed with good results.  Upper GI from 01/2022 reviewed.  Anxiety, depression, PTSD: Chronic, managed on abilify, bupropion, hydroxyzine and clonazepam as prescribed.  He has been having insomnia lately and is not sure if this is related to the Abilify.  He is not currently seeing a therapist.  He denies SI/HI.  Hypothyroidism: He denies any issues on his current dose of levothyroxine.  He does not follow with endocrinology.  Chronic back pain: He does feel like this has worse lately. Managed with pregabalin, methocarbamol, cyclobenzaprine and tramadol as needed.  He does not follow with pain management.  He would like his handicap placard renewed today.  COPD: He denies chronic cough or shortness of breath.  He is not currently using any inhalers.  He does not smoke.  There are no PFTs on file.  BPH: He reports mainly spraying.  He is no longer taking flomax but is taking saw palmetto.  He does not follow with urology.  OSA: He averages 8 hours of sleep per night with the use of his CPAP.  Sleep study from  11/2012 reviewed.  CKD 3: His last creatinine was 1.23, GFR 69, 05/2023.  He is not currently taking an ACEI/ARB.  He does not follow with nephrology.  Review of Systems     Past Medical History:  Diagnosis Date   Anxiety    Aphthous ulcer 10/04/2012   improving   Arthritis    left index fnger   Carpal tunnel syndrome, bilateral    damaged nerve in neck   Chronic pain syndrome    COPD (chronic obstructive pulmonary disease) (HCC)    mild   Depression    Diverticulitis of colon    Esophageal reflux    H/O acute prostatitis    Hypothyroidism    Insomnia, unspecified    Mixed hyperlipidemia    Peyronie disease 12/20/2012   PER uROLOGY   Seasonal allergies    Sleep apnea    uses cpap   Tympanic membrane perforation    history of    Current Outpatient Medications  Medication Sig Dispense Refill   ARIPiprazole (ABILIFY) 2 MG tablet TAKE 1 TABLET BY MOUTH DAILY 90 tablet 1   blood glucose meter kit and supplies Dispense based on patient and insurance preference. Use up to four times daily as directed. (FOR ICD-10 E10.9, E11.9). 1 each 0   buPROPion (WELLBUTRIN XL) 300 MG 24 hr tablet TAKE 1 TABLET BY MOUTH DAILY 90 tablet  0   calcium carbonate (TUMS - DOSED IN MG ELEMENTAL CALCIUM) 500 MG chewable tablet Chew 3 tablets by mouth daily.     clonazePAM (KLONOPIN) 0.5 MG tablet Take 1 tablet (0.5 mg total) by mouth daily as needed. for anxiety 20 tablet 0   cyclobenzaprine (FLEXERIL) 10 MG tablet Take 1 tablet (10 mg total) by mouth daily as needed for muscle spasms. 30 tablet 0   diphenhydrAMINE (BENADRYL) 25 mg capsule Take 50 mg by mouth at bedtime.     erythromycin with ethanol (THERAMYCIN) 2 % external solution APPLY TOPICALLY TWICE A DAY AS NEEDED 60 mL 0   ezetimibe (ZETIA) 10 MG tablet TAKE 1 TABLET BY MOUTH DAILY 90 tablet 3   fexofenadine (ALLEGRA) 180 MG tablet Take 180 mg by mouth daily.     fluticasone (FLONASE) 50 MCG/ACT nasal spray Place 2 sprays into both nostrils  daily. 16 g 5   hydrOXYzine (VISTARIL) 25 MG capsule Take 1 capsule (25 mg total) by mouth every 4 (four) hours as needed. 90 capsule 0   levothyroxine (SYNTHROID) 75 MCG tablet Take 1 tablet (75 mcg total) by mouth daily. 90 tablet 1   methocarbamol (ROBAXIN) 500 MG tablet Take 1 tablet (500 mg total) by mouth every 8 (eight) hours as needed for muscle spasms. 270 tablet 0   montelukast (SINGULAIR) 10 MG tablet TAKE 1 TABLET BY MOUTH AT BEDTIME 90 tablet 0   ondansetron (ZOFRAN-ODT) 4 MG disintegrating tablet Take 1 tablet (4 mg total) by mouth every 8 (eight) hours as needed. 20 tablet 0   pantoprazole (PROTONIX) 40 MG tablet Take 1 tablet (40 mg total) by mouth daily. 90 tablet 1   pregabalin (LYRICA) 150 MG capsule TAKE 1 CAPSULE BY MOUTH TWICE DAILY 60 capsule 0   Pseudoephedrine-guaiFENesin (MUCINEX D MAX STRENGTH PO) Take 1 tablet by mouth every 12 (twelve) hours.     sodium chloride (OCEAN) 0.65 % SOLN nasal spray Place 1 spray into both nostrils as needed for congestion. 15 mL 0   tamsulosin (FLOMAX) 0.4 MG CAPS capsule Take 2 capsules (0.8 mg total) by mouth at bedtime. 180 capsule 1   traMADol (ULTRAM) 50 MG tablet Take 1 tablet (50 mg total) by mouth daily as needed. 30 tablet 0   Vitamin D, Ergocalciferol, (DRISDOL) 1.25 MG (50000 UNIT) CAPS capsule Take 1 capsule (50,000 Units total) by mouth once a week. For 12 weeks. Then start OTC Vitamin D3 2,000 unit daily. 12 capsule 0   No current facility-administered medications for this visit.    Allergies  Allergen Reactions   Augmentin [Amoxicillin-Pot Clavulanate] Diarrhea   Demerol [Meperidine] Other (See Comments)    "goes crazy and has the strength of 10 men"   Egg-Derived Products Diarrhea and Nausea And Vomiting   Sulfamethoxazole Other (See Comments)    Blisters form in mouth   Statins Rash   Sulfa Antibiotics Rash   Sulfacetamide Sodium Rash    Family History  Problem Relation Age of Onset   Heart disease Mother     Arrhythmia Mother    Congestive Heart Failure Father    Heart disease Father    COPD Father    Heart disease Brother        stents at 31 yo   Lupus Brother    Bladder Cancer Paternal Uncle    Heart failure Paternal Grandmother    Kidney cancer Paternal Grandfather    Parkinsonism Neg Hx     Social History   Socioeconomic  History   Marital status: Married    Spouse name: [name]   Number of children: 0   Years of education: college   Highest education level: Associate degree: occupational, Scientist, product/process development, or vocational program  Occupational History   Occupation: disabled    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  Tobacco Use   Smoking status: Former    Current packs/day: 0.00    Average packs/day: 2.0 packs/day for 20.0 years (40.0 ttl pk-yrs)    Types: Cigarettes    Start date: 10/09/1986    Quit date: 10/09/2006    Years since quitting: 17.1   Smokeless tobacco: Never  Vaping Use   Vaping status: Never Used  Substance and Sexual Activity   Alcohol use: Not Currently    Alcohol/week: 1.0 standard drink of alcohol    Types: 1 Cans of beer per week    Comment: rarely   Drug use: No   Sexual activity: Yes    Birth control/protection: Surgical  Other Topics Concern   Not on file  Social History Narrative   12/16/19   From: [place]   Living: with wife [name], since 1990   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      Family: mother and brothers are nearby      Enjoys: record collection with 10,000 LPs - working on digitizing these, fixing up a 1968 mustang       Exercise: not currently   Diet: over-eating, tries to eat somewhat healthy - salad every other day      Safety   Seat belts: Yes    Guns: Yes  and secure   Safe in relationships: Yes    Social Drivers of Corporate investment banker Strain: Low Risk  (11/23/2023)   Overall Financial Resource Strain (CARDIA)    Difficulty of Paying Living Expenses: Not hard at all  Food Insecurity: No Food Insecurity  (11/23/2023)   Hunger Vital Sign    Worried About Running Out of Food in the Last Year: Never true    Ran Out of Food in the Last Year: Never true  Transportation Needs: No Transportation Needs (11/23/2023)   PRAPARE - Administrator, Civil Service (Medical): No    Lack of Transportation (Non-Medical): No  Physical Activity: Unknown (11/23/2023)   Exercise Vital Sign    Days of Exercise per Week: 0 days    Minutes of Exercise per Session: Not on file  Stress: Stress Concern Present (11/23/2023)   [employer] of Occupational Health - Occupational Stress Questionnaire    Feeling of Stress : To some extent  Social Connections: Unknown (11/23/2023)   Social Connection and Isolation Panel [NHANES]    Frequency of Communication with Friends and Family: More than three times a week    Frequency of Social Gatherings with Friends and Family: Twice a week    Attends Religious Services: Patient declined    Database administrator or Organizations: No    Attends Engineer, structural: Not on file    Marital Status: Married  Catering manager Violence: Not on file     Constitutional:   Denies fatigue, fever, malaise, headache or abrupt weight changes.  HEENT: Denies eye pain, eye redness, ear pain, ringing in the ears, wax buildup, runny nose, nasal congestion, bloody nose, or sore throat. Respiratory: Denies difficulty breathing, cough, shortness of breath or sputum production.   Cardiovascular: Denies chest pain, chest tightness, palpitations or swelling in the hands  or feet.  Gastrointestinal: Denies abdominal pain, bloating, constipation, diarrhea or blood in the stool.  GU: Patient reports spraying.  Denies urgency, frequency, pain with urination, burning sensation, blood in urine, odor or discharge. Musculoskeletal: Patient reports chronic back pain, multiple joint pain.  Denies decrease in range of motion, difficulty with gait, or joint swelling.  Skin: Denies redness,  rashes, lesions or ulcercations.  Neurological: Patient reports neuropathic pain, insomnia.  Denies dizziness, difficulty with memory, difficulty with speech or problems with balance and coordination.  Psych: Patient has a history of anxiety and depression.  Denies SI/HI.  No other specific complaints in a complete review of systems (except as listed in HPI above).  Objective:   Physical Exam  BP 130/78   Ht 6\' 2"  (1.88 m)   Wt 287 lb (130.2 kg)   BMI 36.85 kg/m    Wt Readings from Last 3 Encounters:  07/17/23 281 lb (127.5 kg)  05/24/23 289 lb (131.1 kg)  04/30/23 284 lb (128.8 kg)    General: Appears his stated age, obese, in NAD. Skin: Warm, dry and intact. No ulcerations noted. HEENT: Head: normal shape and size; Eyes: sclera white, no icterus, conjunctiva pink, PERRLA and EOMs intact;  Neck:  Neck supple, trachea midline. No masses, lumps or thyromegaly present.  Cardiovascular: Normal rate and rhythm. S1,S2 noted.  No murmur, rubs or gallops noted. No JVD or BLE edema. No carotid bruits noted. Pulmonary/Chest: Normal effort and positive vesicular breath sounds. No respiratory distress. No wheezes, rales or ronchi noted.  Abdomen: Normal bowel sounds.  Musculoskeletal: Pain with palpation over the lumbar spine.  No difficulty with gait.  Neurological: Alert and oriented. Cranial nerves II-XII grossly intact. Coordination normal.  Psychiatric: Mood and affect normal. Behavior is normal. Judgment and thought content normal.     BMET    Component Value Date/Time   NA 140 05/24/2023 0935   NA 142 01/27/2018 0000   NA 138 12/26/2012 1936   K 4.2 05/24/2023 0935   K 3.9 12/26/2012 1936   CL 104 05/24/2023 0935   CL 106 12/26/2012 1936   CO2 27 05/24/2023 0935   CO2 24 12/26/2012 1936   GLUCOSE 160 (H) 05/24/2023 0935   GLUCOSE 124 (H) 12/26/2012 1936   BUN 18 05/24/2023 0935   BUN 19 01/27/2018 0000   BUN 22 (H) 12/26/2012 1936   CREATININE 1.23 05/24/2023 0935    CALCIUM 9.5 05/24/2023 0935   CALCIUM 8.4 (L) 12/26/2012 1936   GFRNONAA >60 06/02/2020 0431   GFRNONAA 58 (L) 12/26/2012 1936   GFRAA >60 06/02/2020 0431   GFRAA >60 12/26/2012 1936    Lipid Panel     Component Value Date/Time   CHOL 96 05/29/2023 0829   TRIG 240 (H) 05/29/2023 0829   HDL 40 05/29/2023 0829   CHOLHDL 2.4 05/29/2023 0829   VLDL 29.8 06/10/2020 0919   LDLCALC 26 05/29/2023 0829    CBC    Component Value Date/Time   WBC 5.5 10/23/2022 1413   RBC 5.09 10/23/2022 1413   HGB 16.3 10/23/2022 1413   HGB 15.2 12/26/2012 1936   HCT 46.8 10/23/2022 1413   HCT 44.6 12/26/2012 1936   PLT 230 10/23/2022 1413   PLT 218 12/26/2012 1936   MCV 91.9 10/23/2022 1413   MCV 94 12/26/2012 1936   MCH 32.0 10/23/2022 1413   MCHC 34.8 10/23/2022 1413   RDW 12.4 10/23/2022 1413   RDW 13.3 12/26/2012 1936   LYMPHSABS 1.9 06/02/2020 0431  MONOABS 0.4 06/02/2020 0431   EOSABS 0.2 06/02/2020 0431   BASOSABS 0.1 06/02/2020 0431    Hgb A1C Lab Results  Component Value Date   HGBA1C 7.0 (H) 05/29/2023           Assessment & Plan:   Multiple joint pain:  Will check ESR, ANA and CRP today  RTC in 6 months for your annual exam Nicki Reaper, NP

## 2023-11-27 NOTE — Assessment & Plan Note (Signed)
 A1c and urine microalbumin today Encouraged him to consume a low-carb diet and exercise for weight loss Not medicated Will request copy of eye exam He declines flu shot Pneumovax UTD Encouraged him to get his COVID booster

## 2023-11-28 ENCOUNTER — Encounter: Payer: Self-pay | Admitting: Internal Medicine

## 2023-11-29 LAB — COMPLETE METABOLIC PANEL WITH GFR
AG Ratio: 1.7 (calc) (ref 1.0–2.5)
ALT: 88 U/L — ABNORMAL HIGH (ref 9–46)
AST: 60 U/L — ABNORMAL HIGH (ref 10–35)
Albumin: 4.5 g/dL (ref 3.6–5.1)
Alkaline phosphatase (APISO): 77 U/L (ref 35–144)
BUN: 15 mg/dL (ref 7–25)
CO2: 28 mmol/L (ref 20–32)
Calcium: 9.7 mg/dL (ref 8.6–10.3)
Chloride: 102 mmol/L (ref 98–110)
Creat: 1.04 mg/dL (ref 0.70–1.30)
Globulin: 2.7 g/dL (ref 1.9–3.7)
Glucose, Bld: 213 mg/dL — ABNORMAL HIGH (ref 65–139)
Potassium: 4.4 mmol/L (ref 3.5–5.3)
Sodium: 138 mmol/L (ref 135–146)
Total Bilirubin: 1.3 mg/dL — ABNORMAL HIGH (ref 0.2–1.2)
Total Protein: 7.2 g/dL (ref 6.1–8.1)
eGFR: 84 mL/min/{1.73_m2} (ref >=60)

## 2023-11-29 LAB — LIPID PANEL
Cholesterol: 190 mg/dL (ref <200)
HDL: 37 mg/dL — ABNORMAL LOW (ref >=40)
LDL Cholesterol (Calc): 110 mg/dL — ABNORMAL HIGH
Non-HDL Cholesterol (Calc): 153 mg/dL — ABNORMAL HIGH (ref <130)
Total CHOL/HDL Ratio: 5.1 (calc) — ABNORMAL HIGH (ref <5.0)
Triglycerides: 324 mg/dL — ABNORMAL HIGH (ref <150)

## 2023-11-29 LAB — CBC
HCT: 48.2 % (ref 38.5–50.0)
Hemoglobin: 16.2 g/dL (ref 13.2–17.1)
MCH: 31.2 pg (ref 27.0–33.0)
MCHC: 33.6 g/dL (ref 32.0–36.0)
MCV: 92.9 fL (ref 80.0–100.0)
MPV: 9.8 fL (ref 7.5–12.5)
Platelets: 204 10*3/uL (ref 140–400)
RBC: 5.19 10*6/uL (ref 4.20–5.80)
RDW: 12.6 % (ref 11.0–15.0)
WBC: 4.5 10*3/uL (ref 3.8–10.8)

## 2023-11-29 LAB — SEDIMENTATION RATE: Sed Rate: 2 mm/h (ref 0–20)

## 2023-11-29 LAB — ANA: Anti Nuclear Antibody (ANA): NEGATIVE

## 2023-11-29 LAB — MICROALBUMIN / CREATININE URINE RATIO
Creatinine, Urine: 76 mg/dL (ref 20–320)
Microalb Creat Ratio: 71 mg/g{creat} — ABNORMAL HIGH (ref <30)
Microalb, Ur: 5.4 mg/dL

## 2023-11-29 LAB — HEMOGLOBIN A1C
Hgb A1c MFr Bld: 8.9 %{Hb} — ABNORMAL HIGH (ref <5.7)
Mean Plasma Glucose: 209 mg/dL
eAG (mmol/L): 11.6 mmol/L

## 2023-11-29 LAB — T4, FREE: Free T4: 1 ng/dL (ref 0.8–1.8)

## 2023-11-29 LAB — TSH: TSH: 4.49 m[IU]/L (ref 0.40–4.50)

## 2023-11-29 LAB — C-REACTIVE PROTEIN: CRP: <3 mg/L (ref <8.0)

## 2023-11-29 MED ORDER — REPATHA 140 MG/ML ~~LOC~~ SOSY: 140.0000 mg | PREFILLED_SYRINGE | SUBCUTANEOUS | 0 refills | Status: DC

## 2023-11-29 MED ORDER — METFORMIN HCL 1000 MG PO TABS: ORAL_TABLET | ORAL | 0 refills | Status: DC

## 2023-11-29 NOTE — Addendum Note (Signed)
 Addended by: Lorre Munroe on: 11/29/2023 07:19 AM   Modules accepted: Orders

## 2023-11-30 ENCOUNTER — Other Ambulatory Visit: Payer: Self-pay | Admitting: Internal Medicine

## 2023-11-30 NOTE — Telephone Encounter (Signed)
 Requested Prescriptions  Pending Prescriptions Disp Refills   levothyroxine (SYNTHROID) 75 MCG tablet [Pharmacy Med Name: LEVOTHYROXINE SODIUM 75 MCG TAB] 90 tablet 1    Sig: TAKE 1 TABLET BY MOUTH DAILY     Endocrinology:  Hypothyroid Agents Passed - 11/30/2023  4:28 PM      Passed - TSH in normal range and within 360 days    TSH  Date Value Ref Range Status  11/27/2023 4.49 0.40 - 4.50 mIU/L Final         Passed - Valid encounter within last 12 months    Recent Outpatient Visits           4 months ago Urine frequency   [place] [facility] [place], [name], NP   6 months ago Encounter for general adult medical examination with abnormal findings   [place] [facility] [place], [name], NP   7 months ago Other fatigue   [place] [facility] [place], [name], NP   8 months ago Mass of left testicle   [facility] [place], [name], NP   10 months ago Chronic right shoulder pain   [place] [facility] [place], [name], NP       Future Appointments             In 6 months Baity, [name], NP [place] [facility], [facility]

## 2023-12-01 ENCOUNTER — Encounter: Payer: Self-pay | Admitting: Internal Medicine

## 2023-12-03 MED ORDER — CITALOPRAM HYDROBROMIDE 40 MG PO TABS: 40.0000 mg | ORAL_TABLET | Freq: Every day | ORAL | 1 refills | Status: DC

## 2023-12-03 MED ORDER — GABAPENTIN 400 MG PO CAPS: 400.0000 mg | ORAL_CAPSULE | Freq: Two times a day (BID) | ORAL | 0 refills | Status: DC

## 2023-12-03 NOTE — Addendum Note (Signed)
 Addended by: Lorre Munroe on: 12/03/2023 11:41 AM   Modules accepted: Orders

## 2023-12-04 ENCOUNTER — Encounter: Payer: Self-pay | Admitting: Internal Medicine

## 2023-12-05 ENCOUNTER — Telehealth: Payer: Self-pay

## 2023-12-05 ENCOUNTER — Encounter: Payer: Self-pay | Admitting: Internal Medicine

## 2023-12-05 NOTE — Telephone Encounter (Signed)
 Copied from CRM [id number]. Topic: Clinical - Medication Question >> Dec 05, 2023  1:38 PM Tiffany B wrote: Reason for CRM: Caller will attempt to send a medication assistance form for his Repatha through MyChart.

## 2023-12-06 NOTE — Telephone Encounter (Signed)
 It looks like he wanted a copy of the form sent through his MyChart or email.  I do not know that we can do this but he can pick it up at the front desk.

## 2023-12-25 DIAGNOSIS — G4733 Obstructive sleep apnea (adult) (pediatric): Secondary | ICD-10-CM | POA: Diagnosis not present

## 2024-01-15 ENCOUNTER — Encounter: Payer: Self-pay | Admitting: Internal Medicine

## 2024-01-17 DIAGNOSIS — K080 Exfoliation of teeth due to systemic causes: Secondary | ICD-10-CM | POA: Diagnosis not present

## 2024-01-22 ENCOUNTER — Ambulatory Visit: Attending: Cardiology | Admitting: Cardiology

## 2024-01-22 ENCOUNTER — Encounter: Payer: Self-pay | Admitting: Cardiology

## 2024-01-22 ENCOUNTER — Telehealth: Payer: Self-pay

## 2024-01-22 VITALS — BP 136/78 | HR 61 | Ht 74.0 in | Wt 277.0 lb

## 2024-01-22 DIAGNOSIS — I10 Essential (primary) hypertension: Secondary | ICD-10-CM | POA: Diagnosis not present

## 2024-01-22 DIAGNOSIS — E782 Mixed hyperlipidemia: Secondary | ICD-10-CM

## 2024-01-22 DIAGNOSIS — E119 Type 2 diabetes mellitus without complications: Secondary | ICD-10-CM

## 2024-01-22 NOTE — Telephone Encounter (Signed)
 Copied from CRM [id number]. Topic: General - Other >> Jan 21, 2024  1:28 PM Alpha Arts wrote: Reason for CRM: Patient stated he will be coming to pickup the form that was faxed for his medications.   Callback: [phone]

## 2024-01-22 NOTE — Patient Instructions (Signed)
 Medication Instructions:  Your physician recommends that you continue on your current medications as directed. Please refer to the Current Medication list given to you today.    *If you need a refill on your cardiac medications before your next appointment, please call your pharmacy*   Lab Work: NONE    If you have labs (blood work) drawn today and your tests are completely normal, you will receive your results only by: MyChart Message (if you have MyChart) OR A paper copy in the mail If you have any lab test that is abnormal or we need to change your treatment, we will call you to review the results.   Testing/Procedures: NONE    Follow-Up: At [facility], you and your health needs are our priority.  As part of our continuing mission to provide you with exceptional heart care, we have created designated Provider Care Teams.  These Care Teams include your primary Cardiologist (physician) and Advanced Practice Providers (APPs -  Physician Assistants and Nurse Practitioners) who all work together to provide you with the care you need, when you need it.  We recommend signing up for the patient portal called "MyChart".  Sign up information is provided on this After Visit Summary.  MyChart is used to connect with patients for Virtual Visits (Telemedicine).  Patients are able to view lab/test results, encounter notes, upcoming appointments, etc.  Non-urgent messages can be sent to your provider as well.   To learn more about what you can do with MyChart, go to [email or web address].    Your next appointment:   12 month(s)  The format for your next appointment:   In Person  Provider:   You will see one of the following Advanced Practice Providers on your designated Care Team:   Laneta Pintos, NP Gildardo Labrador, PA-C Varney Gentleman, PA-C Cadence [place], PA-C Ronald Cockayne, NP Morey Ar, NP     Other Instructions

## 2024-01-22 NOTE — Progress Notes (Signed)
 Cardiology Office Note:  .   Date:  01/22/2024  ID:  Stefano Gaul, DOB 05-06-1966, MRN [id number] PCP: Lorre Munroe, NP  [facility] Providers Cardiologist:  None    History of Present Illness: .   Corey Bell is a 58 y.o. male with past medical history of COPD, OSA, morbid obesity, hypothyroidism, hypertension, obesity status post bariatric surgery, hyperlipidemia, strong family history of coronary artery disease, former smoker quit (2012), peripheral arterial disease, type 2 diabetes, CKD stage IIIa, who is here today for follow-up.  Lexiscan Cardiolite 914 which showed no ischemia or infarct.  Echocardiogram and 2014 showed a normal EF and mild LVH.  CT scan in 2015 with no aortoiliac atherosclerosis.  It repeat CT scan in 2021 with no significant coronary calcification or aortic atherosclerosis.  He was last seen in clinic 10/30/2022 by Dr. Jesusita Oka.  At that time he had recently seen his PCP and PCP wanted to place him on Repatha and he would like to know more information about the medication he also complains of shortness of breath stating he just could not get a deep breath in.  Prescription was placed for Repatha and he was continued on ezetimibe.  Blood pressure has been well-controlled.  No further testing was ordered at this time.  He returns to clinic today stating that overall from a cardiac perspective he has been doing well.  He had recently lost some weight had some medication changes made for his depression and regained the weight unfortunately.  He states that he did wind up in the donut hole and was unable to afford his Repatha and his PCPs office has been working on paperwork for patient's assistance for Repatha previously LDL was very well-controlled on Repatha and ezetimibe.  He denies any chest pain, shortness of breath, dyspnea, palpitations, lightheaded or dizziness or peripheral edema or claudication symptoms.  States that he has been compliant with his current  medication regimen without any adverse side effects.  Denies any hospitalizations or visits to the emergency department.  ROS: 10 point review of system has been reviewed and considered negative except what is listed in the HPI  Studies Reviewed: Marland Kitchen   EKG Interpretation Date/Time:  Tuesday January 22 2024 08:28:05 EDT Ventricular Rate:  63 PR Interval:  138 QRS Duration:  94 QT Interval:  388 QTC Calculation: 397 R Axis:   42  Text Interpretation: Normal sinus rhythm Nonspecific T wave abnormality Left ventricular hypertrophy When compared with ECG of 02-Jun-2020 04:35, No significant change was found Confirmed by Charlsie Quest ([id number]) on 01/22/2024 8:33:20 AM    2De cho 06/17/2013 - Left ventricle: The cavity size was normal. There was mild    concentric hypertrophy. Systolic function was normal. The    estimated ejection fraction was in the range of 55% to    60%. Wall motion was normal; there were no regional wall    motion abnormalities.  - Left atrium: The atrium was mildly dilated.  Risk Assessment/Calculations:             Physical Exam:   VS:  BP 136/78 (BP Location: Left Arm, Patient Position: Sitting, Cuff Size: Large)   Pulse 61   Ht 6\' 2"  (1.88 m)   Wt 277 lb (125.6 kg)   SpO2 94%   BMI 35.56 kg/m    Wt Readings from Last 3 Encounters:  01/22/24 277 lb (125.6 kg)  11/27/23 287 lb (130.2 kg)  07/17/23 281 lb (127.5 kg)  GEN: Well nourished, well developed in no acute distress NECK: No JVD; No carotid bruits CARDIAC: RRR, no murmurs, rubs, gallops RESPIRATORY:  Clear to auscultation without rales, wheezing or rhonchi  ABDOMEN: Soft, non-tender, non-distended EXTREMITIES:  No edema; No deformity   ASSESSMENT AND PLAN: .   Primary hypertension with blood pressure today 136/78.  Blood pressures remain stable.  Not currently requiring any antihypertensive medications.  Encouraged to continue to monitor pressures at home as well.  EKG today reveals sinus rhythm with  rate of 63 with nonspecific T wave abnormalities with LVH with no significant change noted today.  Mixed hyperlipidemia with an LDL of 110.  Unfortunately had previously been on Repatha which dropped his LDL of 26 but due to the donut hole he was unable to continue to afford the medication.  PCPs office is currently doing assistance paperwork.  He is continued on ezetimibe 10 mg daily.  Morbid obesity with a BMI of 35.56.  He has been encouraged to continue with his activity and get back on his weight loss journey.  Type 2 diabetes with a hemoglobin A1c of 8.9.  He has continued on metformin at 1000 milligrams twice daily.  Ongoing management per his PCP.       Dispo: Patient accepting/APP in 11 to 12 months or sooner with EKG on return.  Signed, Keleigh Kazee, NP

## 2024-01-22 NOTE — Telephone Encounter (Signed)
 Copied from CRM [id number]. Topic: General - Other >> Jan 21, 2024  2:36 PM Corey Bell wrote: Reason for CRM: Patient advise a Preauthorization Request Form for his medications is being faxed to office, Advise I will send message to admin

## 2024-01-29 ENCOUNTER — Telehealth: Payer: Self-pay | Admitting: Pharmacy Technician

## 2024-01-29 ENCOUNTER — Other Ambulatory Visit (HOSPITAL_COMMUNITY): Payer: Self-pay

## 2024-01-29 NOTE — Telephone Encounter (Addendum)
 Pharmacy Patient Advocate Encounter   Received notification from Physician's Office that prior authorization for Repatha  is required/requested.   Insurance verification completed.   The patient is insured through [employer] .   Per test claim: PA required; PA submitted to above mentioned insurance via CoverMyMeds Key/confirmation #/EOC [id number] Status is pending

## 2024-01-29 NOTE — Telephone Encounter (Signed)
 That is fine. Please send in the prescription and be on the look out for required paperwork.

## 2024-01-30 ENCOUNTER — Telehealth: Payer: Self-pay

## 2024-01-30 ENCOUNTER — Other Ambulatory Visit (HOSPITAL_COMMUNITY): Payer: Self-pay

## 2024-01-30 ENCOUNTER — Telehealth: Payer: Self-pay | Admitting: Pharmacy Technician

## 2024-01-30 NOTE — Telephone Encounter (Signed)
 We will keep an eye out for forms.

## 2024-01-30 NOTE — Telephone Encounter (Signed)
 We will keep an eye out for the forms.

## 2024-01-30 NOTE — Telephone Encounter (Signed)
 Copied from CRM [id number]. Topic: General - Other >> Jan 30, 2024 12:26 PM DeAngela L wrote: Patient returning call for Nicki in the office about the Repatha  prior auth Patient call back number  [phone] (M)

## 2024-01-30 NOTE — Telephone Encounter (Signed)
 Copied from CRM [id number]. Topic: General - Other >> Jan 21, 2024  2:36 PM Clyde Darling P wrote: Reason for CRM: Patient advise a Preauthorization Request Form for his medications is being faxed to office, Advise I will send message to admin

## 2024-01-30 NOTE — Telephone Encounter (Signed)
 Patient Advocate Encounter   The patient was approved for a [employer] grant that will help cover the cost of repatha  Total amount awarded, 2500.00.  Effective: 12/31/23 - 12/29/24   [id number] VOZ:[id number] [employer] ID: [id number]  They said it will be ready fri after lunch    Pharmacy provided with approval and processing information. Patient informed via telephone   I called the patient and left a message bc we have to submit proof of his medicare to healthwell. Once I receive his insurance information I will send to [employer]well

## 2024-01-30 NOTE — Telephone Encounter (Signed)
 Pharmacy Patient Advocate Encounter  Received notification from [employer] medicare that Prior Authorization for Repatha  Sureclick has been APPROVED from 01/29/24 to 01/28/25. Ran test claim, Copay is $380.74- one month. This test claim was processed through [facility]- copay amounts may vary at other pharmacies due to pharmacy/plan contracts, or as the patient moves through the different stages of their insurance plan.   PA #/Case ID/Reference #: [id number]

## 2024-01-31 NOTE — Telephone Encounter (Signed)
 Lmom again for patient to call me back so I can get his insurance card

## 2024-02-01 NOTE — Telephone Encounter (Signed)
 Lmom again for patient to call me back so I can get his insurance card

## 2024-02-04 ENCOUNTER — Other Ambulatory Visit (HOSPITAL_COMMUNITY): Payer: Self-pay

## 2024-02-04 NOTE — Telephone Encounter (Signed)
   Passed. Pt received medication.

## 2024-02-04 NOTE — Telephone Encounter (Signed)
 Lmom again for patient to call me back so I can get his insurance card   And sent mychart message

## 2024-02-06 ENCOUNTER — Telehealth: Payer: Self-pay | Admitting: Pharmacy Technician

## 2024-02-06 ENCOUNTER — Other Ambulatory Visit: Payer: Self-pay

## 2024-02-06 MED ORDER — REPATHA 140 MG/ML ~~LOC~~ SOSY: 140.0000 mg | PREFILLED_SYRINGE | SUBCUTANEOUS | 0 refills | Status: DC

## 2024-02-06 NOTE — Telephone Encounter (Signed)
 RX sent to pharmacy

## 2024-02-06 NOTE — Telephone Encounter (Signed)
 Hi, we got this patient a prior authorization approval for repatha  sureclick and I got him a healthwell grant for the repatha  sureclick but he just called and said the pharmacy had the repatha  syringe. Can someone send in the repatha  sureclick instead to his Pharmacy:  [facility] - [place], [place] - [address], [place] [address] Phone: [phone]  Fax: [phone]    It was originally written by his pcp but they spoke about it at his appt with you guys last

## 2024-02-22 ENCOUNTER — Other Ambulatory Visit: Payer: Self-pay | Admitting: Internal Medicine

## 2024-02-25 NOTE — Telephone Encounter (Signed)
 Requested Prescriptions  Pending Prescriptions Disp Refills   gabapentin  (NEURONTIN ) 400 MG capsule [Pharmacy Med Name: GABAPENTIN  400 MG CAP] 180 capsule 0    Sig: TAKE 1 CAPSULE BY MOUTH TWICE DAILY     Neurology: Anticonvulsants - gabapentin  Passed - 02/25/2024  4:45 PM      Passed - Cr in normal range and within 360 days    Creat  Date Value Ref Range Status  11/27/2023 1.04 0.70 - 1.30 mg/dL Final   Creatinine,U  Date Value Ref Range Status  09/07/2014 133.2 mg/dL Final   Creatinine, Urine  Date Value Ref Range Status  11/27/2023 76 20 - 320 mg/dL Final         Passed - Completed PHQ-2 or PHQ-9 in the last 360 days      Passed - Valid encounter within last 12 months    Recent Outpatient Visits           3 months ago Diabetes mellitus type 2, diet-controlled (HCC)   [place] [facility] [place], Rankin Buzzard, NP       Future Appointments             In 3 months Baity, Rankin Buzzard, NP [place] [facility], [facility]

## 2024-02-28 ENCOUNTER — Encounter: Payer: Self-pay | Admitting: Internal Medicine

## 2024-02-28 ENCOUNTER — Ambulatory Visit (INDEPENDENT_AMBULATORY_CARE_PROVIDER_SITE_OTHER): Admitting: Internal Medicine

## 2024-02-28 VITALS — BP 140/70 | Ht 74.0 in | Wt 266.8 lb

## 2024-02-28 DIAGNOSIS — R194 Change in bowel habit: Secondary | ICD-10-CM

## 2024-02-28 DIAGNOSIS — R1012 Left upper quadrant pain: Secondary | ICD-10-CM

## 2024-02-28 DIAGNOSIS — K219 Gastro-esophageal reflux disease without esophagitis: Secondary | ICD-10-CM

## 2024-02-28 MED ORDER — ALIGN 10 MG PO CAPS
1.0000 | ORAL_CAPSULE | Freq: Every day | ORAL | 1 refills | Status: DC
Start: 2024-02-28 — End: 2024-05-13

## 2024-02-28 NOTE — Patient Instructions (Signed)
 Abdominal Pain, Adult    Many things can cause belly (abdominal) pain. In most cases, belly pain is not a serious problem and can be watched and treated at home. But in some cases, it can be serious.  Your doctor will try to find the cause of your belly pain.  Follow these instructions at home:  Medicines  Take over-the-counter and prescription medicines only as told by your doctor.  Do not take medicines that help you poop (laxatives) unless told by your doctor.  General instructions  Watch your belly pain for any changes. Tell your doctor if the pain gets worse.  Drink enough fluid to keep your pee (urine) pale yellow.  Contact a doctor if:  Your belly pain changes or gets worse.  You have very bad cramping or bloating in your belly.  You vomit.  Your pain gets worse with meals, after eating, or with certain foods.  You have trouble pooping or have watery poop for more than 2-3 days.  You are not hungry, or you lose weight without trying.  You have signs of not getting enough fluid or water (dehydration). These may include:  Dark pee, very Chastain pee, or no pee.  Cracked lips or dry mouth.  Feeling sleepy or weak.  You have pain when you pee or poop.  Your belly pain wakes you up at night.  You have blood in your pee.  You have a fever.  Get help right away if:  You cannot stop vomiting.  Your pain is only in one part of your belly, like on the right side.  You have bloody or black poop, or poop that looks like tar.  You have trouble breathing.  You have chest pain.  These symptoms may be an emergency. Get help right away. Call 911.  Do not wait to see if the symptoms will go away.  Do not drive yourself to the hospital.  This information is not intended to replace advice given to you by your health care provider. Make sure you discuss any questions you have with your health care provider.  Document Revised: 07/12/2022 Document Reviewed: 07/12/2022  Elsevier Patient Education  2024 [employer].

## 2024-02-28 NOTE — Progress Notes (Signed)
 Subjective:    Patient ID: Corey Bell, male    DOB: 1966-08-11, 58 y.o.   MRN: [id number]  HPI Discussed the use of AI scribe software for clinical note transcription with the patient, who gave verbal consent to proceed.  Corey Bell "Cindie Credit" is a 58 year old male who presents with persistent abdominal pain and altered bowel habits since March 2025.  He has experienced persistent abdominal pain localized to the left upper quadrant, described as a sensation of 'something sucking in from the inside,' typically occurring after bowel movements. His bowel movements are mostly soft, occasionally watery, and rarely regular. He also notes rectal discomfort, describing it as feeling like his 'butthole is turning inside out' after bowel movements.  He recalls an episode of acute gastrointestinal distress after dining out with his brother in March, initially attributing it to the food. Since then, symptoms have persisted, with occasional relief during periods of increased physical activity. He denies any changes in diet or new medications around the onset of symptoms, although he started Repatha  after the symptoms began.  He had a colonoscopy in April 2023, during which a small polyp was removed, and he was informed of internal hemorrhoids. He mentions a previous diagnosis of diverticulosis from 20 years ago but is unsure if it persists, although there is no evidence of this from CT scan in 2015. He denies recent travel to third-world countries but notes poor water  quality during a recent trip to [place] .  No nausea, vomiting, blood in the stool, or significant changes in stool color or odor. He occasionally experiences heartburn, which he differentiates from his current symptoms. He does not consume alcohol and has not used anti-inflammatory medications recently, opting for Tylenol  as needed. He takes Pepto Bismol every other day to manage bowel symptoms and Prilosec for heartburn.  He used to have  regular bowel movements every two to three days, which could be managed with dietary adjustments like Raisin Bran. He currently feels that his bowel issues are higher up in the gastrointestinal tract.        Review of Systems     Past Medical History:  Diagnosis Date   Anxiety    Aphthous ulcer 10/04/2012   improving   Arthritis    left index fnger   Carpal tunnel syndrome, bilateral    damaged nerve in neck   Chronic pain syndrome    COPD (chronic obstructive pulmonary disease) (HCC)    mild   Depression    Diverticulitis of colon    Esophageal reflux    H/O acute prostatitis    Hypothyroidism    Insomnia, unspecified    Mixed hyperlipidemia    Peyronie disease 12/20/2012   PER uROLOGY   Seasonal allergies    Sleep apnea    uses cpap   Tympanic membrane perforation    history of    Current Outpatient Medications  Medication Sig Dispense Refill   ARIPiprazole  (ABILIFY ) 2 MG tablet TAKE 1 TABLET BY MOUTH DAILY 90 tablet 1   blood glucose meter kit and supplies Dispense based on patient and insurance preference. Use up to four times daily as directed. (FOR ICD-10 E10.9, E11.9). 1 each 0   buPROPion  (WELLBUTRIN  XL) 300 MG 24 hr tablet Take 1 tablet (300 mg total) by mouth daily. 90 tablet 0   calcium  carbonate (TUMS - DOSED IN MG ELEMENTAL CALCIUM ) 500 MG chewable tablet Chew 3 tablets by mouth daily.     citalopram  (CELEXA ) 40 MG  tablet Take 1 tablet (40 mg total) by mouth daily. 90 tablet 1   clonazePAM  (KLONOPIN ) 0.5 MG tablet Take 1 tablet (0.5 mg total) by mouth daily as needed. for anxiety 20 tablet 0   cyclobenzaprine  (FLEXERIL ) 10 MG tablet Take 1 tablet (10 mg total) by mouth daily as needed for muscle spasms. 90 tablet 0   diphenhydrAMINE (BENADRYL) 25 mg capsule Take 50 mg by mouth at bedtime.     erythromycin  with ethanol (THERAMYCIN) 2 % external solution APPLY TOPICALLY TWICE A DAY AS NEEDED 60 mL 0   Evolocumab  (REPATHA ) 140 MG/ML SOSY Inject 140 mg into the  skin every 14 (fourteen) days. 6 mL 0   ezetimibe  (ZETIA ) 10 MG tablet TAKE 1 TABLET BY MOUTH DAILY 90 tablet 3   fexofenadine (ALLEGRA) 180 MG tablet Take 180 mg by mouth daily.     fluticasone  (FLONASE ) 50 MCG/ACT nasal spray Place 2 sprays into both nostrils daily. 16 g 5   gabapentin  (NEURONTIN ) 400 MG capsule TAKE 1 CAPSULE BY MOUTH TWICE DAILY 180 capsule 0   hydrOXYzine  (VISTARIL ) 25 MG capsule Take 1 capsule (25 mg total) by mouth every 4 (four) hours as needed. 90 capsule 1   levothyroxine  (SYNTHROID ) 75 MCG tablet TAKE 1 TABLET BY MOUTH DAILY 90 tablet 1   melatonin 1 MG TABS tablet Take 1 mg by mouth at bedtime.     metFORMIN  (GLUCOPHAGE ) 1000 MG tablet Take 0.5 tablets (500 mg total) by mouth 2 (two) times daily with a meal for 14 days, THEN 1 tablet (1,000 mg total) 2 (two) times daily with a meal. 166 tablet 0   methocarbamol  (ROBAXIN ) 500 MG tablet Take 1 tablet (500 mg total) by mouth every 8 (eight) hours as needed for muscle spasms. 270 tablet 0   montelukast  (SINGULAIR ) 10 MG tablet Take 1 tablet (10 mg total) by mouth at bedtime. 90 tablet 1   pantoprazole  (PROTONIX ) 40 MG tablet Take 1 tablet (40 mg total) by mouth daily. 90 tablet 1   traMADol  (ULTRAM ) 50 MG tablet Take 1 tablet (50 mg total) by mouth daily as needed. 30 tablet 0   No current facility-administered medications for this visit.    Allergies  Allergen Reactions   Augmentin  [Amoxicillin -Pot Clavulanate] Diarrhea   Demerol [Meperidine] Other (See Comments)    "goes crazy and has the strength of 10 men"   Egg-Derived Products Diarrhea and Nausea And Vomiting   Sulfamethoxazole Other (See Comments)    Blisters form in mouth   Statins Rash   Sulfa Antibiotics Rash   Sulfacetamide Sodium Rash    Family History  Problem Relation Age of Onset   Heart disease Mother    Arrhythmia Mother    Congestive Heart Failure Father    Heart disease Father    COPD Father    Heart disease Brother        stents at 65  yo   Lupus Brother    Bladder Cancer Paternal Uncle    Heart failure Paternal Grandmother    Kidney cancer Paternal Grandfather    Parkinsonism Neg Hx     Social History   Socioeconomic History   Marital status: Married    Spouse name: Haskell Linker   Number of children: 0   Years of education: college   Highest education level: Associate degree: occupational, Scientist, product/process development, or vocational program  Occupational History   Occupation: disabled    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  Tobacco Use  Smoking status: Former    Current packs/day: 0.00    Average packs/day: 2.0 packs/day for 20.0 years (40.0 ttl pk-yrs)    Types: Cigarettes    Start date: 10/09/1986    Quit date: 10/09/2006    Years since quitting: 17.4   Smokeless tobacco: Never  Vaping Use   Vaping status: Never Used  Substance and Sexual Activity   Alcohol use: Not Currently    Alcohol/week: 1.0 standard drink of alcohol    Types: 1 Cans of beer per week    Comment: rarely   Drug use: No   Sexual activity: Yes    Birth control/protection: Surgical  Other Topics Concern   Not on file  Social History Narrative   12/16/19   From: [place]   Living: with wife Haskell Linker, since 1990   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      Family: mother and brothers are nearby      Enjoys: record collection with 10,000 LPs - working on digitizing these, fixing up a 1968 mustang       Exercise: not currently   Diet: over-eating, tries to eat somewhat healthy - salad every other day      Safety   Seat belts: Yes    Guns: Yes  and secure   Safe in relationships: Yes    Social Drivers of Corporate investment banker Strain: Low Risk  (11/23/2023)   Overall Financial Resource Strain (CARDIA)    Difficulty of Paying Living Expenses: Not hard at all  Food Insecurity: No Food Insecurity (11/23/2023)   Hunger Vital Sign    Worried About Running Out of Food in the Last Year: Never true    Ran Out of Food in the Last Year:  Never true  Transportation Needs: No Transportation Needs (11/23/2023)   PRAPARE - Administrator, Civil Service (Medical): No    Lack of Transportation (Non-Medical): No  Physical Activity: Unknown (11/23/2023)   Exercise Vital Sign    Days of Exercise per Week: 0 days    Minutes of Exercise per Session: Not on file  Stress: Stress Concern Present (11/23/2023)   [employer] of Occupational Health - Occupational Stress Questionnaire    Feeling of Stress : To some extent  Social Connections: Unknown (11/23/2023)   Social Connection and Isolation Panel [NHANES]    Frequency of Communication with Friends and Family: More than three times a week    Frequency of Social Gatherings with Friends and Family: Twice a week    Attends Religious Services: Patient declined    Database administrator or Organizations: No    Attends Engineer, structural: Not on file    Marital Status: Married  Catering manager Violence: Not on file     Constitutional:   Denies fatigue, fever, malaise, headache or abrupt weight changes.  Respiratory: Denies difficulty breathing, cough, shortness of breath or sputum production.   Cardiovascular: Denies chest pain, chest tightness, palpitations or swelling in the hands or feet.  Gastrointestinal: Pt reports abdominal pain, change in bowel habits, intermittent reflux. Denies bloating, constipation, diarrhea or blood in the stool.  GU: Denies urgency, frequency, pain with urination, burning sensation, blood in urine, odor or discharge.  No other specific complaints in a complete review of systems (except as listed in HPI above).  Objective:   Physical Exam  BP (!) 140/70   Ht 6\' 2"  (1.88 m)   Wt 266 lb 12.8 oz (121 kg)  BMI 34.26 kg/m     Wt Readings from Last 3 Encounters:  01/22/24 277 lb (125.6 kg)  11/27/23 287 lb (130.2 kg)  07/17/23 281 lb (127.5 kg)    General: Appears his stated age, obese, in NAD. Cardiovascular: Normal  rate and rhythm. S1,S2 noted.  No murmur, rubs or gallops noted.  Pulmonary/Chest: Normal effort and positive vesicular breath sounds. No respiratory distress. No wheezes, rales or ronchi noted.  Abdomen: Soft and tender with light palpation in the LUQ. Normal bowel sounds. No distention or masses noted.  Musculoskeletal: No difficulty with gait.  Neurological: Alert and oriented.    BMET    Component Value Date/Time   NA 138 11/27/2023 0820   NA 142 01/27/2018 0000   NA 138 12/26/2012 1936   K 4.4 11/27/2023 0820   K 3.9 12/26/2012 1936   CL 102 11/27/2023 0820   CL 106 12/26/2012 1936   CO2 28 11/27/2023 0820   CO2 24 12/26/2012 1936   GLUCOSE 213 (H) 11/27/2023 0820   GLUCOSE 124 (H) 12/26/2012 1936   BUN 15 11/27/2023 0820   BUN 19 01/27/2018 0000   BUN 22 (H) 12/26/2012 1936   CREATININE 1.04 11/27/2023 0820   CALCIUM  9.7 11/27/2023 0820   CALCIUM  8.4 (L) 12/26/2012 1936   GFRNONAA >60 06/02/2020 0431   GFRNONAA 58 (L) 12/26/2012 1936   GFRAA >60 06/02/2020 0431   GFRAA >60 12/26/2012 1936    Lipid Panel     Component Value Date/Time   CHOL 190 11/27/2023 0820   TRIG 324 (H) 11/27/2023 0820   HDL 37 (L) 11/27/2023 0820   CHOLHDL 5.1 (H) 11/27/2023 0820   VLDL 29.8 06/10/2020 0919   LDLCALC 110 (H) 11/27/2023 0820    CBC    Component Value Date/Time   WBC 4.5 11/27/2023 0820   RBC 5.19 11/27/2023 0820   HGB 16.2 11/27/2023 0820   HGB 15.2 12/26/2012 1936   HCT 48.2 11/27/2023 0820   HCT 44.6 12/26/2012 1936   PLT 204 11/27/2023 0820   PLT 218 12/26/2012 1936   MCV 92.9 11/27/2023 0820   MCV 94 12/26/2012 1936   MCH 31.2 11/27/2023 0820   MCHC 33.6 11/27/2023 0820   RDW 12.6 11/27/2023 0820   RDW 13.3 12/26/2012 1936   LYMPHSABS 1.9 06/02/2020 0431   MONOABS 0.4 06/02/2020 0431   EOSABS 0.2 06/02/2020 0431   BASOSABS 0.1 06/02/2020 0431    Hgb A1C Lab Results  Component Value Date   HGBA1C 8.9 (H) 11/27/2023           Assessment & Plan:    Assessment and Plan    LUQ Abdominal pain with changes in bowel habits Chronic left upper quadrant pain with soft stools since March. Previous colonoscopy showed polyp and hemorrhoids. Differential includes infection, pancreatic or liver issues, H. pylori. Recent travel to [place]  with poor water  quality. No IBS or NSAID use. Occasional heartburn managed with Prilosec. - Order stool studies: GI pathogen panel, ova and parasites, H. pylori test. - Order lipase level and metabolic panel. - Advise discontinuation of Pepto Bismol. - Recommend probiotic Align, one capsule daily. - Consider CT abdomen if symptoms persist. - Consider gastroenterology referral if necessary.      RTC in 2 months for your annual exam Helayne Lo, NP

## 2024-02-29 ENCOUNTER — Ambulatory Visit: Payer: Self-pay | Admitting: Internal Medicine

## 2024-02-29 DIAGNOSIS — R748 Abnormal levels of other serum enzymes: Secondary | ICD-10-CM

## 2024-02-29 DIAGNOSIS — R1012 Left upper quadrant pain: Secondary | ICD-10-CM

## 2024-02-29 DIAGNOSIS — R194 Change in bowel habit: Secondary | ICD-10-CM

## 2024-03-04 DIAGNOSIS — R1012 Left upper quadrant pain: Secondary | ICD-10-CM | POA: Diagnosis not present

## 2024-03-04 DIAGNOSIS — R194 Change in bowel habit: Secondary | ICD-10-CM | POA: Diagnosis not present

## 2024-03-07 ENCOUNTER — Encounter: Payer: Self-pay | Admitting: Internal Medicine

## 2024-03-07 ENCOUNTER — Encounter (HOSPITAL_COMMUNITY): Payer: Self-pay | Admitting: *Deleted

## 2024-03-07 ENCOUNTER — Other Ambulatory Visit: Payer: Self-pay | Admitting: Internal Medicine

## 2024-03-07 LAB — HELICOBACTER PYLORI  SPECIAL ANTIGEN
MICRO NUMBER:: 16505404
SPECIMEN QUALITY: ADEQUATE

## 2024-03-07 LAB — OVA AND PARASITE EXAMINATION
CONCENTRATE RESULT:: NONE SEEN
MICRO NUMBER:: 16503438
SPECIMEN QUALITY:: ADEQUATE
TRICHROME RESULT:: NONE SEEN

## 2024-03-07 LAB — GASTROINTESTINAL PATHOGEN PNL
CampyloBacter Group: NOT DETECTED
Norovirus GI/GII: NOT DETECTED
Rotavirus A: NOT DETECTED
Salmonella species: NOT DETECTED
Shiga Toxin 1: NOT DETECTED
Shiga Toxin 2: NOT DETECTED
Shigella Species: NOT DETECTED
Vibrio Group: NOT DETECTED
Yersinia enterocolitica: NOT DETECTED

## 2024-03-10 NOTE — Telephone Encounter (Signed)
 Requested Prescriptions  Pending Prescriptions Disp Refills   metFORMIN  (GLUCOPHAGE ) 1000 MG tablet [Pharmacy Med Name: METFORMIN  HCL 1000 MG TAB] 180 tablet 0    Sig: TAKE 1/2 TABLET BY MOUTH 2 TIMES DAILY WITH A MEAL FOR 14 DAYS THEN TAKE 1 TABLET 2 TIMES DAILY WITH A MEAL     Endocrinology:  Diabetes - Biguanides Failed - 03/10/2024  8:57 AM      Failed - HBA1C is between 0 and 7.9 and within 180 days    Hemoglobin A1C  Date Value Ref Range Status  10/19/2017 5.8  Final   Hgb A1c MFr Bld  Date Value Ref Range Status  11/27/2023 8.9 (H) <5.7 % of total Hgb Final    Comment:    For someone without known diabetes, a hemoglobin A1c value of 6.5% or greater indicates that they may have  diabetes and this should be confirmed with a follow-up  test. . For someone with known diabetes, a value <7% indicates  that their diabetes is well controlled and a value  greater than or equal to 7% indicates suboptimal  control. A1c targets should be individualized based on  duration of diabetes, age, comorbid conditions, and  other considerations. . Currently, no consensus exists regarding use of hemoglobin A1c for diagnosis of diabetes for children. .          Failed - CBC within normal limits and completed in the last 12 months    WBC  Date Value Ref Range Status  11/27/2023 4.5 3.8 - 10.8 Thousand/uL Final   RBC  Date Value Ref Range Status  11/27/2023 5.19 4.20 - 5.80 Million/uL Final   Hemoglobin  Date Value Ref Range Status  11/27/2023 16.2 13.2 - 17.1 g/dL Final   HGB  Date Value Ref Range Status  12/26/2012 15.2 13.0 - 18.0 g/dL Final   HCT  Date Value Ref Range Status  11/27/2023 48.2 38.5 - 50.0 % Final  12/26/2012 44.6 40.0 - 52.0 % Final   MCHC  Date Value Ref Range Status  11/27/2023 33.6 32.0 - 36.0 g/dL Final    Comment:    For adults, a slight decrease in the calculated MCHC value (in the range of 30 to 32 g/dL) is most likely not clinically significant;  however, it should be interpreted with caution in correlation with other red cell parameters and the patient's clinical condition.    [facility]  Date Value Ref Range Status  11/27/2023 31.2 27.0 - 33.0 pg Final   MCV  Date Value Ref Range Status  11/27/2023 92.9 80.0 - 100.0 fL Final  12/26/2012 94 80 - 100 fL Final   No results found for: "PLTCOUNTKUC", "LABPLAT", "POCPLA" RDW  Date Value Ref Range Status  11/27/2023 12.6 11.0 - 15.0 % Final  12/26/2012 13.3 11.5 - 14.5 % Final         Passed - Cr in normal range and within 360 days    Creat  Date Value Ref Range Status  02/28/2024 1.14 0.70 - 1.30 mg/dL Final   Creatinine,U  Date Value Ref Range Status  09/07/2014 133.2 mg/dL Final   Creatinine, Urine  Date Value Ref Range Status  11/27/2023 76 20 - 320 mg/dL Final         Passed - eGFR in normal range and within 360 days    EGFR (African American)  Date Value Ref Range Status  12/26/2012 >60  Final   GFR calc Af Zara Heymann  Date Value Ref Range Status  06/02/2020 >60 >60 mL/min Final   EGFR (Non-African Amer.)  Date Value Ref Range Status  12/26/2012 58 (L)  Final    Comment:    eGFR values <81mL/min/1.73 m2 may be an indication of chronic kidney disease (CKD). Calculated eGFR is useful in patients with stable renal function. The eGFR calculation will not be reliable in acutely ill patients when serum creatinine is changing rapidly. It is not useful in  patients on dialysis. The eGFR calculation may not be applicable to patients at the low and high extremes of body sizes, pregnant women, and vegetarians.    GFR calc non Af Amer  Date Value Ref Range Status  06/02/2020 >60 >60 mL/min Final   GFR  Date Value Ref Range Status  12/16/2019 60.94 >60.00 mL/min Final   eGFR  Date Value Ref Range Status  02/28/2024 75 > OR = 60 mL/min/1.65m2 Final         Passed - B12 Level in normal range and within 720 days    Vitamin B-12  Date Value Ref Range Status   04/30/2023 416 200 - 1,100 pg/mL Final         Passed - Valid encounter within last 6 months    Recent Outpatient Visits           1 week ago LUQ pain   [place] [facility] [place], [place], NP   3 months ago Diabetes mellitus type 2, diet-controlled [facility])   [place] [facility] [place], Rankin Buzzard, NP       Future Appointments             In 2 months Baity, Rankin Buzzard, NP [facility], [facility]

## 2024-03-12 ENCOUNTER — Ambulatory Visit

## 2024-03-13 NOTE — Addendum Note (Signed)
 Addended by: Carollynn Cirri on: 03/13/2024 01:08 PM   Modules accepted: Orders

## 2024-03-14 ENCOUNTER — Ambulatory Visit: Payer: Self-pay | Admitting: Internal Medicine

## 2024-03-14 ENCOUNTER — Ambulatory Visit
Admission: RE | Admit: 2024-03-14 | Discharge: 2024-03-14 | Disposition: A | Discharge status: Home / Self Care | Source: Ambulatory Visit | Attending: Internal Medicine | Admitting: Internal Medicine

## 2024-03-14 DIAGNOSIS — K760 Fatty (change of) liver, not elsewhere classified: Secondary | ICD-10-CM | POA: Diagnosis not present

## 2024-03-14 DIAGNOSIS — K8020 Calculus of gallbladder without cholecystitis without obstruction: Secondary | ICD-10-CM | POA: Diagnosis not present

## 2024-03-14 DIAGNOSIS — R748 Abnormal levels of other serum enzymes: Secondary | ICD-10-CM | POA: Diagnosis not present

## 2024-03-19 ENCOUNTER — Ambulatory Visit: Admission: RE | Admit: 2024-03-19 | Source: Ambulatory Visit

## 2024-03-20 ENCOUNTER — Ambulatory Visit
Admission: RE | Admit: 2024-03-20 | Discharge: 2024-03-20 | Disposition: A | Discharge status: Home / Self Care | Source: Ambulatory Visit | Attending: Internal Medicine | Admitting: Internal Medicine

## 2024-03-20 DIAGNOSIS — K8020 Calculus of gallbladder without cholecystitis without obstruction: Secondary | ICD-10-CM | POA: Diagnosis not present

## 2024-03-20 DIAGNOSIS — R748 Abnormal levels of other serum enzymes: Secondary | ICD-10-CM | POA: Diagnosis not present

## 2024-03-20 DIAGNOSIS — R1012 Left upper quadrant pain: Secondary | ICD-10-CM | POA: Insufficient documentation

## 2024-03-20 DIAGNOSIS — K760 Fatty (change of) liver, not elsewhere classified: Secondary | ICD-10-CM | POA: Diagnosis not present

## 2024-03-20 MED ORDER — IOHEXOL 350 MG/ML SOLN
100.0000 mL | Freq: Once | INTRAVENOUS | Status: AC | PRN
  Administered 2024-03-20: 100 mL via INTRAVENOUS

## 2024-03-20 MED ORDER — IOHEXOL 9 MG/ML PO SOLN
500.0000 mL | ORAL | Status: AC
  Administered 2024-03-20 (×2): 500 mL via ORAL

## 2024-04-08 ENCOUNTER — Ambulatory Visit: Admitting: Family

## 2024-04-08 ENCOUNTER — Encounter: Payer: Self-pay | Admitting: Family

## 2024-04-08 ENCOUNTER — Ambulatory Visit: Payer: Self-pay | Admitting: Family

## 2024-04-08 VITALS — BP 118/68 | HR 67 | Temp 97.9°F | Resp 16 | Ht 74.0 in | Wt 264.6 lb

## 2024-04-08 DIAGNOSIS — E559 Vitamin D deficiency, unspecified: Secondary | ICD-10-CM

## 2024-04-08 DIAGNOSIS — K760 Fatty (change of) liver, not elsewhere classified: Secondary | ICD-10-CM

## 2024-04-08 DIAGNOSIS — E038 Other specified hypothyroidism: Secondary | ICD-10-CM | POA: Diagnosis not present

## 2024-04-08 DIAGNOSIS — R194 Change in bowel habit: Secondary | ICD-10-CM | POA: Insufficient documentation

## 2024-04-08 DIAGNOSIS — E119 Type 2 diabetes mellitus without complications: Secondary | ICD-10-CM

## 2024-04-08 DIAGNOSIS — G8929 Other chronic pain: Secondary | ICD-10-CM

## 2024-04-08 DIAGNOSIS — R7989 Other specified abnormal findings of blood chemistry: Secondary | ICD-10-CM

## 2024-04-08 DIAGNOSIS — K8020 Calculus of gallbladder without cholecystitis without obstruction: Secondary | ICD-10-CM | POA: Insufficient documentation

## 2024-04-08 DIAGNOSIS — M5442 Lumbago with sciatica, left side: Secondary | ICD-10-CM

## 2024-04-08 DIAGNOSIS — Z903 Acquired absence of stomach [part of]: Secondary | ICD-10-CM

## 2024-04-08 DIAGNOSIS — E1169 Type 2 diabetes mellitus with other specified complication: Secondary | ICD-10-CM

## 2024-04-08 DIAGNOSIS — R809 Proteinuria, unspecified: Secondary | ICD-10-CM | POA: Insufficient documentation

## 2024-04-08 DIAGNOSIS — E785 Hyperlipidemia, unspecified: Secondary | ICD-10-CM

## 2024-04-08 LAB — POCT GLYCOSYLATED HEMOGLOBIN (HGB A1C): Hemoglobin A1C: 6.1 % — AB (ref 4.0–5.6)

## 2024-04-08 MED ORDER — METHOCARBAMOL 500 MG PO TABS
1000.0000 mg | ORAL_TABLET | Freq: Three times a day (TID) | ORAL | 0 refills | Status: AC | PRN
Start: 2024-04-08 — End: ?

## 2024-04-08 MED ORDER — METFORMIN HCL ER 500 MG PO TB24: 500.0000 mg | ORAL_TABLET | Freq: Every day | ORAL | 0 refills | Status: DC

## 2024-04-08 MED ORDER — EMPAGLIFLOZIN 10 MG PO TABS: 10.0000 mg | ORAL_TABLET | Freq: Every day | ORAL | 0 refills | Status: DC

## 2024-04-08 NOTE — Progress Notes (Signed)
 Established Patient Office Visit  Subjective:  Patient ID: Corey Bell, male    DOB: 10-Mar-1966  Age: 58 y.o. MRN: [id number]  CC:  Chief Complaint  Patient presents with   Transitions Of Care    HPI Corey Bell is here for a transition of care visit.  Prior provider was: Dr. Angeline baity    Pt is with acute concerns. Having problems with stomach Went to [place]  for a week back in March, March 3rd, and march 11th had lunch and afterwards he got 'sick as a dog' with diarrhea. He states since then most of the time his stool is soft, was constipated x one and he had fiber and it was again lose. He states his rear end 'feels like it is turned inside out' doesn't sting or burn but he states that he goes 'so much' when he goes that he feels it pushed something out. Sometimes will have stomach distension and then other time will feel he is 'sucking in'. He has lost some weight since feb but he does state has been working on choosing the right foods, cutting down on snacks and working on portion control.  He does report hearytburn if he does not take panoptrazole but if he does take this it is fine.   CT abd pelvis 03/20/24, hepatic steatosis, cholelithiasis. No acute findings.  Colonoscopy 01/12/22 with one polyp with advise to repeat x 7 years.he was found to have external hemorrhoids.  He does have chronically elevated liver function tests. Stool cultures 03/04/24 unremarkable h pylori negative. No issues with beef or pork. Has been to allergist, intolerance to eggs but otherwise work up was negative. Allergic to ragweed.   Last metabolic panel Lab Results  Component Value Date   GLUCOSE 98 02/28/2024   NA 139 02/28/2024   K 4.6 02/28/2024   CL 103 02/28/2024   CO2 28 02/28/2024   BUN 19 02/28/2024   CREATININE 1.14 02/28/2024   EGFR 75 02/28/2024   CALCIUM  9.8 02/28/2024   PHOS 3.5 12/18/2012   PROT 7.5 02/28/2024   ALBUMIN  4.2 06/02/2020   LABGLOB 2.2 02/23/2014   AGRATIO  1.9 02/23/2014   BILITOT 1.9 (H) 02/28/2024   ALKPHOS 63 06/02/2020   AST 61 (H) 02/28/2024   ALT 95 (H) 02/28/2024   ANIONGAP 7 06/02/2020      Wt Readings from Last 3 Encounters:  04/08/24 264 lb 9.6 oz (120 kg)  02/28/24 266 lb 12.8 oz (121 kg)  01/22/24 277 lb (125.6 kg)   DM2: A1c 8.9 % four months ago. Was started on metformin  back in feb, is on metformin  1000 mg twice daily. He does state thinking back that when he increased to 1000 mg twice daily that he had increased stomach issues.    Hyperlipidemia: is on repatha  140 mg every 14 days and also on zetia .  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 Will take tramadol  and robaxin  prn. On average he takes robaxin  1000 mg nightly and then prn with flares         Past Medical History:  Diagnosis Date   Anxiety    Aphthous ulcer 10/04/2012   improving   Arthritis    left index  fnger   Carpal tunnel syndrome, bilateral    damaged nerve in neck   Chronic pain syndrome    COPD (chronic obstructive pulmonary disease) (HCC)    mild   Depression    Diverticulitis of colon    Esophageal reflux    H/O acute prostatitis    Hypothyroidism    Insomnia, unspecified    Mixed hyperlipidemia    Peyronie disease 12/20/2012   PER uROLOGY   Seasonal allergies    Sleep apnea    uses cpap   Tympanic membrane perforation    history of    Past Surgical History:  Procedure Laterality Date   COLONOSCOPY WITH PROPOFOL  N/A 12/15/2021   Procedure: COLONOSCOPY WITH PROPOFOL ;  Surgeon: Therisa Bi, MD;  Location[facility] ENDOSCOPY;  Service: Gastroenterology;  Laterality: N/A;   COLONOSCOPY WITH PROPOFOL  N/A 01/12/2022   Procedure: COLONOSCOPY WITH PROPOFOL ;  Surgeon: Therisa Bi, MD;   Location: [facility] ENDOSCOPY;  Service: Gastroenterology;  Laterality: N/A;   LAPAROSCOPIC GASTRIC SLEEVE RESECTION N/A 07/31/2017   Procedure: LAPAROSCOPIC GASTRIC SLEEVE RESECTION WITH UPPER ENDOSCOPY;  Surgeon: Ethyl Lenis, MD;  Location: [facility] ORS;  Service: General;  Laterality: N/A;   LumbarSacral Disc Surgery     L5-S1   MENISCUS REPAIR Left    torn meniscus   NESBIT PROCEDURE N/A 08/22/2013   Procedure: 16 DOT PLACTATION;  Surgeon: Oneil JAYSON Rafter, MD;  Location: [facility];  Service: Urology;  Laterality: N/A;   TYMPANOSTOMY TUBE PLACEMENT  10/09/1973   ULNAR COLLATERAL LIGAMENT REPAIR Right 04/17/2019   Procedure: RIGHT THUMB ULNAR COLLATERAL LIGAMENT REPAIR;  Surgeon: Murrell Drivers, MD;  Location: [facility];  Service: Orthopedics;  Laterality: Right;   ULNAR COLLATERAL LIGAMENT REPAIR Right 10/16/2019   Procedure: RIGHT THUMB ULNAR COLLATERAL LIGAMENT RECONSTRUCTION;  Surgeon: Murrell Drivers, MD;  Location: [facility];  Service: Orthopedics;  Laterality: Right;  block in preop   VASECTOMY Bilateral 08/22/2013   Procedure: VASECTOMY;  Surgeon: Oneil JAYSON Rafter, MD;  Location: [facility];  Service: Urology;  Laterality: Bilateral;    Family History  Problem Relation Age of Onset   Heart disease Mother    Arrhythmia Mother    Congestive Heart Failure Father    Heart disease Father    COPD Father    Heart disease Brother        stents at 105 yo   Lupus Brother    Bladder Cancer Paternal Uncle    Heart failure Paternal Grandmother    Kidney cancer Paternal Grandfather    Parkinsonism Neg Hx     Social History   Socioeconomic History   Marital status: Married    Spouse name: Inocente   Number of children: 0   Years of education: college   Highest education level: Associate degree: occupational, Scientist, product/process development, or vocational program  Occupational History   Occupation: disabled    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  Tobacco Use   Smoking status: Former    Current packs/day: 0.00    Average packs/day: 2.0 packs/day for 20.0 years (40.0 ttl pk-yrs)    Types: Cigarettes    Start date: 10/09/1986    Quit date: 10/09/2006    Years since quitting: 17.5   Smokeless tobacco: Never  Vaping Use   Vaping status: Never Used  Substance and Sexual Activity   Alcohol use: Not Currently    Alcohol/week: 1.0 standard drink of alcohol    Types: 1 Cans of beer  per week    Comment: rarely   Drug use: No   Sexual activity: Yes    Birth control/protection: Surgical  Other Topics Concern   Not on file  Social History Narrative   12/16/19   From: [place]   Living: with wife Inocente, since 1990   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      Family: mother and brothers are nearby      Enjoys: record collection with 10,000 LPs - working on digitizing these, fixing up a 1968 mustang       Exercise: not currently   Diet: over-eating, tries to eat somewhat healthy - salad every other day      Safety   Seat belts: Yes    Guns: Yes  and secure   Safe in relationships: Yes    Social Drivers of Corporate investment banker Strain: Low Risk  (04/07/2024)   Overall Financial Resource Strain (CARDIA)    Difficulty of Paying Living Expenses: Not hard at all  Food Insecurity: No Food Insecurity (04/07/2024)   Hunger Vital Sign    Worried About Running Out of Food in the Last Year: Never true    Ran Out of Food in the Last Year: Never true  Transportation Needs: No Transportation Needs (04/07/2024)   PRAPARE - Administrator, Civil Service (Medical): No    Lack of Transportation (Non-Medical): No  Physical Activity: Unknown (04/07/2024)   Exercise Vital Sign    Days of Exercise per Week: Patient declined    Minutes of Exercise per Session: Not on file  Stress: No Stress Concern Present (04/07/2024)   [employer] of Occupational Health - Occupational Stress Questionnaire    Feeling of Stress:  Only a little  Social Connections: Moderately Isolated (04/07/2024)   Social Connection and Isolation Panel    Frequency of Communication with Friends and Family: More than three times a week    Frequency of Social Gatherings with Friends and Family: Once a week    Attends Religious Services: Patient declined    Database administrator or Organizations: No    Attends Engineer, structural: Not on file    Marital Status: Married  Catering manager Violence: Not on file    Outpatient Medications Prior to Visit  Medication Sig Dispense Refill   blood glucose meter kit and supplies Dispense based on patient and insurance preference. Use up to four times daily as directed. (FOR ICD-10 E10.9, E11.9). 1 each 0   buPROPion  (WELLBUTRIN  XL) 300 MG 24 hr tablet Take 1 tablet (300 mg total) by mouth daily. 90 tablet 0   calcium  carbonate (TUMS - DOSED IN MG ELEMENTAL CALCIUM ) 500 MG chewable tablet Chew 3 tablets by mouth daily.     citalopram  (CELEXA ) 40 MG tablet Take 1 tablet (40 mg total) by mouth daily. 90 tablet 1   clonazePAM  (KLONOPIN ) 0.5 MG tablet Take 1 tablet (0.5 mg total) by mouth daily as needed. for anxiety 20 tablet 0   cyclobenzaprine  (FLEXERIL ) 10 MG tablet Take 1 tablet (10 mg total) by mouth daily as needed for muscle spasms. 90 tablet 0   diphenhydrAMINE (BENADRYL) 25 mg capsule Take 50 mg by mouth at bedtime.     erythromycin  with ethanol (THERAMYCIN) 2 % external solution APPLY TOPICALLY TWICE A DAY AS NEEDED 60 mL 0   Evolocumab  (REPATHA ) 140 MG/ML SOSY Inject 140 mg into the skin every 14 (fourteen) days. 6 mL 0  ezetimibe  (ZETIA ) 10 MG tablet TAKE 1 TABLET BY MOUTH DAILY 90 tablet 3   fexofenadine (ALLEGRA) 180 MG tablet Take 180 mg by mouth daily.     fluticasone  (FLONASE ) 50 MCG/ACT nasal spray Place 2 sprays into both nostrils daily. 16 g 5   gabapentin  (NEURONTIN ) 400 MG capsule TAKE 1 CAPSULE BY MOUTH TWICE DAILY 180 capsule 0   hydrOXYzine  (VISTARIL ) 25 MG  capsule Take 1 capsule (25 mg total) by mouth every 4 (four) hours as needed. 90 capsule 1   levothyroxine  (SYNTHROID ) 75 MCG tablet TAKE 1 TABLET BY MOUTH DAILY 90 tablet 1   melatonin 1 MG TABS tablet Take 1 mg by mouth at bedtime.     montelukast  (SINGULAIR ) 10 MG tablet Take 1 tablet (10 mg total) by mouth at bedtime. 90 tablet 1   pantoprazole  (PROTONIX ) 40 MG tablet Take 1 tablet (40 mg total) by mouth daily. 90 tablet 1   Probiotic Product (ALIGN) 10 MG CAPS Take 1 capsule by mouth daily. 90 capsule 1   pseudoephedrine-guaifenesin (MUCINEX D) 60-600 MG 12 hr tablet Take 1 tablet by mouth every 12 (twelve) hours.     Saw Palmetto 450 MG CAPS Take 450 mg by mouth in the morning, at noon, in the evening, and at bedtime.     traMADol  (ULTRAM ) 50 MG tablet Take 1 tablet (50 mg total) by mouth daily as needed. 30 tablet 0   metFORMIN  (GLUCOPHAGE ) 1000 MG tablet TAKE 1/2 TABLET BY MOUTH 2 TIMES DAILY WITH A MEAL FOR 14 DAYS THEN TAKE 1 TABLET 2 TIMES DAILY WITH A MEAL 180 tablet 0   methocarbamol  (ROBAXIN ) 500 MG tablet Take 1 tablet (500 mg total) by mouth every 8 (eight) hours as needed for muscle spasms. 270 tablet 0   No facility-administered medications prior to visit.    Allergies  Allergen Reactions   Augmentin  [Amoxicillin -Pot Clavulanate] Diarrhea   Demerol [Meperidine] Other (See Comments)    goes crazy and has the strength of 10 men   Egg-Derived Products Diarrhea and Nausea And Vomiting   Sulfamethoxazole Other (See Comments)    Blisters form in mouth   Statins Rash   Sulfa Antibiotics Rash   Sulfacetamide Sodium Rash    ROS: Pertinent symptoms negative unless otherwise noted in HPI      Objective:    Physical Exam Constitutional:      Appearance: Normal appearance. He is obese.  HENT:     Head: Normocephalic.   Cardiovascular:     Rate and Rhythm: Normal rate.  Pulmonary:     Effort: Pulmonary effort is normal.  Abdominal:     Tenderness: There is  abdominal tenderness in the right lower quadrant and left lower quadrant. There is no guarding or rebound.   Musculoskeletal:        General: Normal range of motion.   Neurological:     General: No focal deficit present.     Mental Status: He is alert and oriented to person, place, and time. Mental status is at baseline.   Psychiatric:        Mood and Affect: Mood normal.        Behavior: Behavior normal.        Thought Content: Thought content normal.        Judgment: Judgment normal.    `  BP 118/68   Pulse 67   Temp 97.9 F (36.6 C)   Resp 16   Ht 6' 2 (1.88 m)  Wt 264 lb 9.6 oz (120 kg)   SpO2 98%   BMI 33.97 kg/m  Wt Readings from Last 3 Encounters:  04/08/24 264 lb 9.6 oz (120 kg)  02/28/24 266 lb 12.8 oz (121 kg)  01/22/24 277 lb (125.6 kg)     Health Maintenance Due  Topic Date Due   Medicare Annual Wellness (AWV)  Never done   Hepatitis B Vaccines (1 of 3 - 19+ 3-dose series) Never done   Pneumococcal Vaccine 57-57 Years old (2 of 2 - PCV) 06/10/2021       Topic Date Due   Hepatitis B Vaccines (1 of 3 - 19+ 3-dose series) Never done    Lab Results  Component Value Date   TSH 4.49 11/27/2023   Lab Results  Component Value Date   WBC 4.5 11/27/2023   HGB 16.2 11/27/2023   HCT 48.2 11/27/2023   MCV 92.9 11/27/2023   PLT 204 11/27/2023   Lab Results  Component Value Date   NA 139 02/28/2024   K 4.6 02/28/2024   CO2 28 02/28/2024   GLUCOSE 98 02/28/2024   BUN 19 02/28/2024   CREATININE 1.14 02/28/2024   BILITOT 1.9 (H) 02/28/2024   ALKPHOS 63 06/02/2020   AST 61 (H) 02/28/2024   ALT 95 (H) 02/28/2024   PROT 7.5 02/28/2024   ALBUMIN  4.2 06/02/2020   CALCIUM  9.8 02/28/2024   ANIONGAP 7 06/02/2020   EGFR 75 02/28/2024   GFR 60.94 12/16/2019   Lab Results  Component Value Date   CHOL 190 11/27/2023   Lab Results  Component Value Date   HDL 37 (L) 11/27/2023   Lab Results  Component Value Date   LDLCALC 110 (H) 11/27/2023    Lab Results  Component Value Date   TRIG 324 (H) 11/27/2023   Lab Results  Component Value Date   CHOLHDL 5.1 (H) 11/27/2023   Lab Results  Component Value Date   HGBA1C 6.1 (A) 04/08/2024      Assessment & Plan:   Diabetes mellitus type 2, diet-controlled (HCC) -     metFORMIN  HCl ER; Take 1 tablet (500 mg total) by mouth daily with breakfast.  Dispense: 60 tablet; Refill: 0 -     POCT glycosylated hemoglobin (Hb A1C) -     Empagliflozin; Take 1 tablet (10 mg total) by mouth daily before breakfast.  Dispense: 90 tablet; Refill: 0  Hyperlipidemia associated with type 2 diabetes mellitus (HCC) Assessment & Plan: Repeat lipid panel pending results.  Continue repatha  and zetia .  Could consider fish oils for triglycerides.  work on low cholesterol diet and exercise as tolerated   Orders: -     Lipid panel; Future  Other specified hypothyroidism Assessment & Plan: Educated pt on taking in am, prior to medications and food by at least 30 min to one hour.   Advised also to separate four hours from anti acid medications or vitamins.  Will repeat tsh at 3 month f/u as pt should be taking correctly and we will have a more appropriate thyroid  level due to compliance with correct administration.   Goal TSH is 0.5-2    Microalbuminuria  Bowel habit changes Assessment & Plan: Suspect from metformin   Trial decrease to metformin  XR 500 mg twice daily  If improvement but not complete improvement, decrease to once daily and then if needed d/c.  Also with presumable constipation with llq abdominal tenderness and gaseous exam on rlq.  Suggested miralax once daily until complete bowel movement.  Increase water  intake goal 80 oz daily.   F/u two weeks and we will re evaluate   Elevated liver function tests -     Hepatitis panel, acute; Future -     Comprehensive metabolic panel with GFR; Future  Hepatic steatosis Assessment & Plan: Pt advised to work on diet and exercise  as tolerated Reviewed u/s abdomen.  Elevated LFT will repeat again today.  Also ordering hepatitis panel rule out acute process If continues to rise, consider GI  Orders: -     Comprehensive metabolic panel with GFR; Future  Calculus of gallbladder without cholecystitis without obstruction  Vitamin D  deficiency -     VITAMIN D  25 Hydroxy (Vit-D Deficiency, Fractures); Future  Chronic left-sided low back pain with left-sided sciatica -     Methocarbamol ; Take 2 tablets (1,000 mg total) by mouth every 8 (eight) hours as needed for muscle spasms.  Dispense: 270 tablet; Refill: 0  H/O gastric sleeve    Meds ordered this encounter  Medications   metFORMIN  (GLUCOPHAGE -XR) 500 MG 24 hr tablet    Sig: Take 1 tablet (500 mg total) by mouth daily with breakfast.    Dispense:  60 tablet    Refill:  0    Supervising Provider:   BEDSOLE, AMY E [[id number]]   methocarbamol  (ROBAXIN ) 500 MG tablet    Sig: Take 2 tablets (1,000 mg total) by mouth every 8 (eight) hours as needed for muscle spasms.    Dispense:  270 tablet    Refill:  0    Supervising Provider:   BEDSOLE, AMY E [[id number]]   empagliflozin (JARDIANCE) 10 MG TABS tablet    Sig: Take 1 tablet (10 mg total) by mouth daily before breakfast.    Dispense:  90 tablet    Refill:  0    Supervising Provider:   AVELINA, AMY E [[id number]]    Follow-up: Return in about 3 months (around 07/09/2024) for f/u diabetes.    Ginger Patrick, FNP

## 2024-04-08 NOTE — Assessment & Plan Note (Signed)
 Suspect from metformin   Trial decrease to metformin  XR 500 mg twice daily  If improvement but not complete improvement, decrease to once daily and then if needed d/c.  Also with presumable constipation with llq abdominal tenderness and gaseous exam on rlq.  Suggested miralax once daily until complete bowel movement.  Increase water  intake goal 80 oz daily.   F/u two weeks and we will re evaluate

## 2024-04-08 NOTE — Assessment & Plan Note (Signed)
 Repeat lipid panel pending results.  Continue repatha  and zetia .  Could consider fish oils for triglycerides.  work on low cholesterol diet and exercise as tolerated

## 2024-04-08 NOTE — Assessment & Plan Note (Addendum)
 Educated pt on taking in am, prior to medications and food by at least 30 min to one hour.   Advised also to separate four hours from anti acid medications or vitamins.  Will repeat tsh at 3 month f/u as pt should be taking correctly and we will have a more appropriate thyroid  level due to compliance with correct administration.   Goal TSH is 0.5-2

## 2024-04-08 NOTE — Patient Instructions (Addendum)
  Take levothyroxine  in the am, prior to medications and food by at least 30 min to one hour.  Advised also to separate four hours from anti acid medications or vitamins.    ------------------------------------  Decrease metformin  to 500 mg XR twice daily to see if symptom improvement.  If not decrease to 500 mg once daily If no real improvement or only slight discontinue.   Start jardiance 10 mg once daily for your diabetes. Start this one week after trying out the decrease in metformin .    ------------------------------------ Add fiber supplement once daily.  Drink 64 oz of water  a day. Eat lots of fresh fruit and veggies. Ensure regular exercise.    If you are not able to have regular BM's with the above regimen, you may add miralax 1 tablespoon daily.  Increase or decrease amount/frequency as needed to ensure 1 soft BM/day.   ------------------------------------

## 2024-04-08 NOTE — Assessment & Plan Note (Signed)
 Pt advised to work on diet and exercise as tolerated Reviewed u/s abdomen.  Elevated LFT will repeat again today.  Also ordering hepatitis panel rule out acute process If continues to rise, consider GI

## 2024-04-09 NOTE — Telephone Encounter (Signed)
 47$ is too expensive for pt for jardiance.  Looks like in the epic RX summary it suggests that even farxiga would be the same $. [facility] says 'not covered'  Any coupons you know of or assistance?

## 2024-04-10 ENCOUNTER — Other Ambulatory Visit (INDEPENDENT_AMBULATORY_CARE_PROVIDER_SITE_OTHER): Payer: Self-pay | Admitting: Pharmacist

## 2024-04-10 DIAGNOSIS — E119 Type 2 diabetes mellitus without complications: Secondary | ICD-10-CM

## 2024-04-10 NOTE — Progress Notes (Signed)
   04/10/2024 Name: Corey Bell MRN: [id number] DOB: 1965/12/25  Subjective  Chief Complaint  Patient presents with   Medication Access    Care Team: Primary Care Provider: Corwin Antu, FNP  Reason for visit: ?  JUDGE DUQUE is a 58 y.o. male who presents today for a telephone visit with the pharmacist due to medication access concerns regarding their Jardiance. ?   Medication Access: ?  Reports that all medications are not affordable.  Jardiance copay of $47/month is not affordable for patient at this time.   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  Medicare LIS Eligible: No  Couples Income Limit $2485/month; exceeds  Assessment and Plan:   1. Medication Access Patient-reported income for household of 2 <250% FPL and therefore likely qualifies for patient assistance.  Discussed patient assistance application with patient today via MyChart.  Left patient voicemail informing him that [facility] Medication Access team may reach out to him regarding his application and that application should come via mail. Patient chart forwarded to [facility] Medication Access team for assistance with [facility] application for Jardiance 10 mg daily.  If income documents show greater than 250%, will plan for Farxiga through [employer].   Future Appointments  Date Time Provider Department Center  04/14/2024  7:45 AM [facility]  07/10/2024  7:40 AM Corwin Antu, FNP [facility]    Manuelita FABIENE Kobs, PharmD Clinical Pharmacist [facility] [phone]

## 2024-04-14 ENCOUNTER — Other Ambulatory Visit (INDEPENDENT_AMBULATORY_CARE_PROVIDER_SITE_OTHER)

## 2024-04-14 DIAGNOSIS — K760 Fatty (change of) liver, not elsewhere classified: Secondary | ICD-10-CM | POA: Diagnosis not present

## 2024-04-14 DIAGNOSIS — E559 Vitamin D deficiency, unspecified: Secondary | ICD-10-CM

## 2024-04-14 DIAGNOSIS — R7989 Other specified abnormal findings of blood chemistry: Secondary | ICD-10-CM

## 2024-04-14 DIAGNOSIS — E1169 Type 2 diabetes mellitus with other specified complication: Secondary | ICD-10-CM

## 2024-04-14 DIAGNOSIS — E785 Hyperlipidemia, unspecified: Secondary | ICD-10-CM

## 2024-04-14 LAB — LIPID PANEL
Cholesterol: 90 mg/dL (ref 0–200)
HDL: 36.3 mg/dL — ABNORMAL LOW (ref >39.00)
LDL Cholesterol: 20 mg/dL (ref 0–99)
NonHDL: 53.94
Total CHOL/HDL Ratio: 2
Triglycerides: 171 mg/dL — ABNORMAL HIGH (ref 0.0–149.0)
VLDL: 34.2 mg/dL (ref 0.0–40.0)

## 2024-04-14 LAB — COMPREHENSIVE METABOLIC PANEL WITH GFR
ALT: 59 U/L — ABNORMAL HIGH (ref 0–53)
AST: 35 U/L (ref 0–37)
Albumin: 4.4 g/dL (ref 3.5–5.2)
Alkaline Phosphatase: 49 U/L (ref 39–117)
BUN: 12 mg/dL (ref 6–23)
CO2: 31 meq/L (ref 19–32)
Calcium: 9.5 mg/dL (ref 8.4–10.5)
Chloride: 103 meq/L (ref 96–112)
Creatinine, Ser: 1.02 mg/dL (ref 0.40–1.50)
GFR: 81.6 mL/min (ref >60.00)
Glucose, Bld: 149 mg/dL — ABNORMAL HIGH (ref 70–99)
Potassium: 5.1 meq/L (ref 3.5–5.1)
Sodium: 139 meq/L (ref 135–145)
Total Bilirubin: 1.4 mg/dL — ABNORMAL HIGH (ref 0.2–1.2)
Total Protein: 6.8 g/dL (ref 6.0–8.3)

## 2024-04-14 LAB — VITAMIN D 25 HYDROXY (VIT D DEFICIENCY, FRACTURES): VITD: 25.73 ng/mL — ABNORMAL LOW (ref 30.00–100.00)

## 2024-04-14 MED ORDER — CHOLECALCIFEROL 1.25 MG (50000 UT) PO TABS
1.0000 | ORAL_TABLET | ORAL | 0 refills | Status: DC
Start: 2024-04-14 — End: 2024-05-13

## 2024-04-15 LAB — HEPATITIS PANEL, ACUTE
Hep A IgM: NONREACTIVE
Hep B C IgM: NONREACTIVE
Hepatitis B Surface Ag: NONREACTIVE
Hepatitis C Ab: NONREACTIVE

## 2024-04-19 ENCOUNTER — Encounter: Payer: Self-pay | Admitting: Family

## 2024-04-22 ENCOUNTER — Telehealth: Payer: Self-pay | Admitting: Family

## 2024-04-22 ENCOUNTER — Telehealth: Payer: Self-pay

## 2024-04-22 NOTE — Telephone Encounter (Signed)
 Gave pt a call to let him know he will be receiving pap Jardiance ,spoke with pt and is aware he will get a mail with in 3-5 days, faxed provider portion today

## 2024-04-22 NOTE — Telephone Encounter (Signed)
 Erroneous

## 2024-04-22 NOTE — Telephone Encounter (Signed)
 Any update on the jardiance ? Pt has not yet started I saw your last message July 3 looking for coverage.

## 2024-04-25 NOTE — Telephone Encounter (Signed)
 Received provider portion today waiting on pt portion to be mail back to us .

## 2024-05-02 ENCOUNTER — Other Ambulatory Visit: Payer: Self-pay | Admitting: Pharmacist

## 2024-05-02 NOTE — Progress Notes (Signed)
 Brief Telephone Documentation Reason for Call: Patient left message regarding question for pharmacist stating he still has not received Jardiance  application in the mail  Summary of Call: Patient pages filled out and sent to patient again 05/02/24.  Advised patient to return pages to clinic in person or by mail.   Follow Up: Patient given direct line for further questions/concerns.  Manuelita FABIENE Kobs, PharmD Clinical Pharmacist [facility] [phone]

## 2024-05-05 ENCOUNTER — Other Ambulatory Visit: Payer: Self-pay | Admitting: Family

## 2024-05-05 NOTE — Telephone Encounter (Signed)
 Received pt portion PAP Jardiance  today faxed it to [employer] today along provider portion will follow up in a few days.

## 2024-05-06 NOTE — Telephone Encounter (Signed)
 Spoke with pt explain he mail pap [employer] Jardiance  back to [facility] said she will be submitting pap to company.

## 2024-05-13 ENCOUNTER — Ambulatory Visit (INDEPENDENT_AMBULATORY_CARE_PROVIDER_SITE_OTHER): Admitting: Family

## 2024-05-13 ENCOUNTER — Encounter: Payer: Self-pay | Admitting: Family

## 2024-05-13 VITALS — BP 142/82 | HR 69 | Temp 98.4°F | Ht 74.0 in | Wt 268.4 lb

## 2024-05-13 DIAGNOSIS — H66001 Acute suppurative otitis media without spontaneous rupture of ear drum, right ear: Secondary | ICD-10-CM | POA: Insufficient documentation

## 2024-05-13 MED ORDER — CEFDINIR 300 MG PO CAPS
300.0000 mg | ORAL_CAPSULE | Freq: Two times a day (BID) | ORAL | 0 refills | Status: AC
Start: 2024-05-13 — End: 2024-05-23

## 2024-05-13 NOTE — Progress Notes (Signed)
 Established Patient Office Visit  Subjective:   Patient ID: FRISCO CORDTS, male    DOB: 1966-06-24  Age: 58 y.o. MRN: [id number]  CC:  Chief Complaint  Patient presents with   Acute Visit    Possible ear infection (right ear)    HPI: Corey Bell is a 58 y.o. male presenting on 05/13/2024 for Acute Visit (Possible ear infection (right ear))   c/o right ear pain, waking him up at night. Symptoms forhte last two days, no other symptoms. Decreased hearing slightly.   No fever.  Tyelnol is helpful.   Allergies, he does take mucinex, allegra, flonase  and singulair  for this.       ROS: Negative unless specifically indicated above in HPI.   Relevant past medical history reviewed and updated as indicated.   Allergies and medications reviewed and updated.   Current Outpatient Medications:    blood glucose meter kit and supplies, Dispense based on patient and insurance preference. Use up to four times daily as directed. (FOR ICD-10 E10.9, E11.9)., Disp: 1 each, Rfl: 0   buPROPion  (WELLBUTRIN  XL) 300 MG 24 hr tablet, TAKE 1 TABLET BY MOUTH DAILY, Disp: 90 tablet, Rfl: 0   calcium  carbonate (TUMS - DOSED IN MG ELEMENTAL CALCIUM ) 500 MG chewable tablet, Chew 3 tablets by mouth daily., Disp: , Rfl:    cefdinir  (OMNICEF ) 300 MG capsule, Take 1 capsule (300 mg total) by mouth 2 (two) times daily for 10 days., Disp: 20 capsule, Rfl: 0   citalopram  (CELEXA ) 40 MG tablet, Take 1 tablet (40 mg total) by mouth daily., Disp: 90 tablet, Rfl: 1   clonazePAM  (KLONOPIN ) 0.5 MG tablet, Take 1 tablet (0.5 mg total) by mouth daily as needed. for anxiety, Disp: 20 tablet, Rfl: 0   cyclobenzaprine  (FLEXERIL ) 10 MG tablet, Take 1 tablet (10 mg total) by mouth daily as needed for muscle spasms., Disp: 90 tablet, Rfl: 0   diphenhydrAMINE (BENADRYL) 25 mg capsule, Take 50 mg by mouth at bedtime., Disp: , Rfl:    erythromycin  with ethanol (THERAMYCIN) 2 % external solution, APPLY TOPICALLY TWICE A DAY AS  NEEDED, Disp: 60 mL, Rfl: 0   Evolocumab  (REPATHA ) 140 MG/ML SOSY, Inject 140 mg into the skin every 14 (fourteen) days., Disp: 6 mL, Rfl: 0   ezetimibe  (ZETIA ) 10 MG tablet, TAKE 1 TABLET BY MOUTH DAILY, Disp: 90 tablet, Rfl: 3   fexofenadine (ALLEGRA) 180 MG tablet, Take 180 mg by mouth daily., Disp: , Rfl:    fluticasone  (FLONASE ) 50 MCG/ACT nasal spray, Place 2 sprays into both nostrils daily., Disp: 16 g, Rfl: 5   gabapentin  (NEURONTIN ) 400 MG capsule, TAKE 1 CAPSULE BY MOUTH TWICE DAILY, Disp: 180 capsule, Rfl: 0   hydrOXYzine  (VISTARIL ) 25 MG capsule, Take 1 capsule (25 mg total) by mouth every 4 (four) hours as needed., Disp: 90 capsule, Rfl: 1   levothyroxine  (SYNTHROID ) 75 MCG tablet, TAKE 1 TABLET BY MOUTH DAILY, Disp: 90 tablet, Rfl: 1   melatonin 1 MG TABS tablet, Take 1 mg by mouth at bedtime., Disp: , Rfl:    metFORMIN  (GLUCOPHAGE -XR) 500 MG 24 hr tablet, Take 1 tablet (500 mg total) by mouth daily with breakfast., Disp: 60 tablet, Rfl: 0   methocarbamol  (ROBAXIN ) 500 MG tablet, Take 2 tablets (1,000 mg total) by mouth every 8 (eight) hours as needed for muscle spasms., Disp: 270 tablet, Rfl: 0   montelukast  (SINGULAIR ) 10 MG tablet, Take 1 tablet (10 mg total) by mouth at bedtime., Disp:  90 tablet, Rfl: 1   pantoprazole  (PROTONIX ) 40 MG tablet, Take 1 tablet (40 mg total) by mouth daily., Disp: 90 tablet, Rfl: 1   pseudoephedrine-guaifenesin (MUCINEX D) 60-600 MG 12 hr tablet, Take 1 tablet by mouth every 12 (twelve) hours., Disp: , Rfl:    Saw Palmetto 450 MG CAPS, Take 450 mg by mouth in the morning, at noon, in the evening, and at bedtime., Disp: , Rfl:    traMADol  (ULTRAM ) 50 MG tablet, Take 1 tablet (50 mg total) by mouth daily as needed., Disp: 30 tablet, Rfl: 0  Allergies  Allergen Reactions   Augmentin  [Amoxicillin -Pot Clavulanate] Diarrhea   Demerol [Meperidine] Other (See Comments)    goes crazy and has the strength of 10 men   Egg-Derived Products Diarrhea and  Nausea And Vomiting   Statins Rash   Sulfa Antibiotics Rash   Sulfamethoxazole Rash    Blisters form in mouth    Objective:   BP (!) 142/82 (BP Location: Left Arm, Patient Position: Sitting, Cuff Size: Large)   Pulse 69   Temp 98.4 F (36.9 C) (Temporal)   Ht 6' 2 (1.88 m)   Wt 268 lb 6.4 oz (121.7 kg)   SpO2 98%   BMI 34.46 kg/m    Physical Exam Vitals reviewed.  Constitutional:      General: He is not in acute distress.    Appearance: Normal appearance. He is obese. He is not ill-appearing, toxic-appearing or diaphoretic.  HENT:     Head: Normocephalic.     Right Ear: Tympanic membrane is erythematous and retracted.     Left Ear: Tympanic membrane normal.     Nose: Nose normal.     Mouth/Throat:     Mouth: Mucous membranes are moist.  Eyes:     Pupils: Pupils are equal, round, and reactive to light.  Cardiovascular:     Rate and Rhythm: Normal rate and regular rhythm.  Pulmonary:     Effort: Pulmonary effort is normal.     Breath sounds: Normal breath sounds. No wheezing.  Musculoskeletal:        General: Normal range of motion.     Cervical back: Normal range of motion.  Neurological:     General: No focal deficit present.     Mental Status: He is alert and oriented to person, place, and time. Mental status is at baseline.  Psychiatric:        Mood and Affect: Mood normal.        Behavior: Behavior normal.        Thought Content: Thought content normal.        Judgment: Judgment normal.     Assessment & Plan:  Non-recurrent acute suppurative otitis media of right ear without spontaneous rupture of tympanic membrane Assessment & Plan: Take antibiotic as prescribed. Increase oral fluids. Pt to f/u if sx worsen and or fail to improve in 2-3 days.  Rx cefdinir  300 mg po bid x 10 days    Orders: -     Cefdinir ; Take 1 capsule (300 mg total) by mouth 2 (two) times daily for 10 days.  Dispense: 20 capsule; Refill: 0     Follow up plan: Return if symptoms  worsen or fail to improve.  Ginger Patrick, FNP

## 2024-05-13 NOTE — Assessment & Plan Note (Signed)
Take antibiotic as prescribed. Increase oral fluids. Pt to f/u if sx worsen and or fail to improve in 2-3 days. Rx cefdinir 300 mg po bid x 10 days  

## 2024-05-15 ENCOUNTER — Encounter: Payer: Self-pay | Admitting: Family

## 2024-05-26 ENCOUNTER — Other Ambulatory Visit: Payer: Self-pay | Admitting: Internal Medicine

## 2024-05-28 NOTE — Telephone Encounter (Signed)
 Requested by interface surescripts. Future visit on 07/10/24.  Requested Prescriptions  Pending Prescriptions Disp Refills   gabapentin  (NEURONTIN ) 400 MG capsule [Pharmacy Med Name: GABAPENTIN  400 MG CAP] 180 capsule 0    Sig: TAKE 1 CAPSULE BY MOUTH TWICE DAILY     Neurology: Anticonvulsants - gabapentin  Passed - 05/28/2024 12:29 PM      Passed - Cr in normal range and within 360 days    Creat  Date Value Ref Range Status  02/28/2024 1.14 0.70 - 1.30 mg/dL Final   Creatinine, Ser  Date Value Ref Range Status  04/14/2024 1.02 0.40 - 1.50 mg/dL Final   Creatinine, Urine  Date Value Ref Range Status  11/27/2023 76 20 - 320 mg/dL Final         Passed - Completed PHQ-2 or PHQ-9 in the last 360 days      Passed - Valid encounter within last 12 months    Recent Outpatient Visits           3 months ago LUQ pain   [facility] [place], [place], NP   6 months ago Diabetes mellitus type 2, diet-controlled [facility])    [facility] [place], Angeline ORN, [place]

## 2024-05-30 ENCOUNTER — Ambulatory Visit: Payer: Medicare Other | Admitting: Internal Medicine

## 2024-06-06 ENCOUNTER — Ambulatory Visit
Admission: RE | Admit: 2024-06-06 | Discharge: 2024-06-06 | Disposition: A | Discharge status: Home / Self Care | Payer: Self-pay | Source: Ambulatory Visit | Attending: Emergency Medicine | Admitting: Emergency Medicine

## 2024-06-06 VITALS — BP 133/79 | HR 59 | Temp 98.4°F | Resp 20

## 2024-06-06 DIAGNOSIS — H60391 Other infective otitis externa, right ear: Secondary | ICD-10-CM

## 2024-06-06 MED ORDER — CIPROFLOXACIN-DEXAMETHASONE 0.3-0.1 % OT SUSP: 4.0000 [drp] | Freq: Two times a day (BID) | OTIC | 0 refills | Status: AC

## 2024-06-06 NOTE — Discharge Instructions (Signed)
 Today you are being treated for an infection of the ear canal, on exam there is no abnormality to your eardrum but the ear canal is very red and slightly swollen  Use 4 drops of Ciprodex  into the right ear every morning and every evening for 7 days, this is a mixture in the steroid, you should begin to see improvement after 48 hours of medication use and then it should progressively get better  You may use Tylenol  or ibuprofen for management of discomfort  May hold warm compresses to the ear for additional comfort  Please not attempted any ear cleaning or object or fluid placement into the ear canal to prevent further irritation

## 2024-06-06 NOTE — ED Triage Notes (Signed)
 Patient reports that he had and ear infection 2 weeks ago. Patient now complains of right ear pain and fullness. Patient has taken Tylenol  for pain last  dose at 6:30 am.

## 2024-06-06 NOTE — ED Provider Notes (Signed)
 Corey Bell    CSN: [id number] Arrival date & time: 06/06/24  0808      History   Chief Complaint Chief Complaint  Patient presents with   Ear Fullness    Had an infection 2 weeks ago. took antibiotics and felt fine. Unfortunately it's back. - Entered by patient   Otalgia    HPI Corey Bell is a 58 y.o. male.   Patient presents for evaluation of right-sided ear fullness and pain beginning 2 days ago.  Endorses ear infection at the beginning of the month which fully resolved after use of antibiotics.  Has congestion at baseline which has not worsened.  Denies fever, decreased hearing, ear drainage.  Denies recent swimming.  Past Medical History:  Diagnosis Date   Anxiety    Aphthous ulcer 10/04/2012   improving   Arthritis    left index fnger   Carpal tunnel syndrome, bilateral    damaged nerve in neck   Chronic pain syndrome    COPD (chronic obstructive pulmonary disease) (HCC)    mild   Depression    Diverticulitis of colon    Esophageal reflux    H/O acute prostatitis    Hypothyroidism    Insomnia, unspecified    Mixed hyperlipidemia    Peyronie disease 12/20/2012   PER uROLOGY   Seasonal allergies    Sleep apnea    uses cpap   Tympanic membrane perforation    history of    Patient Active Problem List   Diagnosis Date Noted   Microalbuminuria 04/08/2024   Bowel habit changes 04/08/2024   Chronic left-sided low back pain with left-sided sciatica 04/08/2024   H/O gastric sleeve 04/08/2024   Vitamin D  deficiency 04/08/2024   Calculus of gallbladder without cholecystitis without obstruction 04/08/2024   Hepatic steatosis 04/08/2024   Elevated liver function tests 04/08/2024   Myalgia due to statin 05/01/2022   Class 2 obesity due to excess calories with body mass index (BMI) of 36.0 to 36.9 in adult 04/25/2021   BPH (benign prostatic hyperplasia) 03/02/2020   Adjustment reaction with anxiety and depression 03/06/2019   Post-traumatic stress  disorder, acute 01/27/2019   Diabetes mellitus type 2, diet-controlled (HCC) 07/31/2017   GERD (gastroesophageal reflux disease) 04/07/2015   Hyperlipidemia associated with type 2 diabetes mellitus (HCC) 06/03/2014   DDD (degenerative disc disease), cervical 10/13/2013   DDD (degenerative disc disease), lumbosacral 10/13/2013   OSA (obstructive sleep apnea) 07/01/2013   Essential hypertension 05/29/2013   Hypothyroidism    COPD (chronic obstructive pulmonary disease) (HCC) 01/07/2013    Past Surgical History:  Procedure Laterality Date   COLONOSCOPY WITH PROPOFOL  N/A 12/15/2021   Procedure: COLONOSCOPY WITH PROPOFOL ;  Surgeon: Therisa Bi, MD;  Location: [facility] ENDOSCOPY;  Service: Gastroenterology;  Laterality: N/A;   COLONOSCOPY WITH PROPOFOL  N/A 01/12/2022   Procedure: COLONOSCOPY WITH PROPOFOL ;  Surgeon: Therisa Bi, MD;  Location: [facility] ENDOSCOPY;  Service: Gastroenterology;  Laterality: N/A;   LAPAROSCOPIC GASTRIC SLEEVE RESECTION N/A 07/31/2017   Procedure: LAPAROSCOPIC GASTRIC SLEEVE RESECTION WITH UPPER ENDOSCOPY;  Surgeon: Ethyl Lenis, MD;  Location: [facility] ORS;  Service: General;  Laterality: N/A;   LumbarSacral Disc Surgery     L5-S1   MENISCUS REPAIR Left    torn meniscus   NESBIT PROCEDURE N/A 08/22/2013   Procedure: 16 DOT PLACTATION;  Surgeon: Oneil JAYSON Rafter, MD;  Location: [facility];  Service: Urology;  Laterality: N/A;   TYMPANOSTOMY TUBE PLACEMENT  10/09/1973   ULNAR COLLATERAL LIGAMENT REPAIR  Right 04/17/2019   Procedure: RIGHT THUMB ULNAR COLLATERAL LIGAMENT REPAIR;  Surgeon: Murrell Drivers, MD;  Location: [facility];  Service: Orthopedics;  Laterality: Right;   ULNAR COLLATERAL LIGAMENT REPAIR Right 10/16/2019   Procedure: RIGHT THUMB ULNAR COLLATERAL LIGAMENT RECONSTRUCTION;  Surgeon: Murrell Drivers, MD;  Location: [facility];  Service: Orthopedics;  Laterality: Right;  block in preop   VASECTOMY Bilateral 08/22/2013    Procedure: VASECTOMY;  Surgeon: Oneil JAYSON Rafter, MD;  Location: [facility];  Service: Urology;  Laterality: Bilateral;       Home Medications    Prior to Admission medications   Medication Sig Start Date End Date Taking? Authorizing Provider  ciprofloxacin -dexamethasone  (CIPRODEX ) OTIC suspension Place 4 drops into the right ear 2 (two) times daily for 7 days. 06/06/24 06/13/24 Yes Christan Defranco, Shelba SAUNDERS, NP  blood glucose meter kit and supplies Dispense based on patient and insurance preference. Use up to four times daily as directed. (FOR ICD-10 E10.9, E11.9). 05/16/22   Antonette [place]N, NP  buPROPion  (WELLBUTRIN  XL) 300 MG 24 hr tablet TAKE 1 TABLET BY MOUTH DAILY 05/05/24   Dugal, Tabitha, FNP  calcium  carbonate (TUMS - DOSED IN MG ELEMENTAL CALCIUM ) 500 MG chewable tablet Chew 3 tablets by mouth daily.    [provider]  citalopram  (CELEXA ) 40 MG tablet Take 1 tablet (40 mg total) by mouth daily. 12/03/23   Antonette [place] ORN, NP  clonazePAM  (KLONOPIN ) 0.5 MG tablet Take 1 tablet (0.5 mg total) by mouth daily as needed. for anxiety 11/27/23   Antonette [place] ORN, NP  cyclobenzaprine  (FLEXERIL ) 10 MG tablet Take 1 tablet (10 mg total) by mouth daily as needed for muscle spasms. 11/27/23   Antonette [place] ORN, NP  diphenhydrAMINE (BENADRYL) 25 mg capsule Take 50 mg by mouth at bedtime.    [provider]  erythromycin  with ethanol (THERAMYCIN) 2 % external solution APPLY TOPICALLY TWICE A DAY AS NEEDED 07/18/22   Antonette [place] ORN, NP  Evolocumab  (REPATHA ) 140 MG/ML SOSY Inject 140 mg into the skin every 14 (fourteen) days. 02/06/24   Gerard Frederick, NP  ezetimibe  (ZETIA ) 10 MG tablet TAKE 1 TABLET BY MOUTH DAILY 07/31/23   Antonette [place] ORN, NP  fexofenadine (ALLEGRA) 180 MG tablet Take 180 mg by mouth daily.    [provider]  fluticasone  (FLONASE ) 50 MCG/ACT nasal spray Place 2 sprays into both nostrils daily. 05/24/23   Antonette [place] ORN, NP  gabapentin  (NEURONTIN ) 400 MG  capsule TAKE 1 CAPSULE BY MOUTH TWICE DAILY 05/28/24   Antonette [place]eline ORN, NP  hydrOXYzine  (VISTARIL ) 25 MG capsule Take 1 capsule (25 mg total) by mouth every 4 (four) hours as needed. 11/27/23   Antonette [place] ORN, NP  levothyroxine  (SYNTHROID ) 75 MCG tablet TAKE 1 TABLET BY MOUTH DAILY 11/30/23   Antonette [place] ORN, NP  melatonin 1 MG TABS tablet Take 1 mg by mouth at bedtime.    [provider]  metFORMIN  (GLUCOPHAGE -XR) 500 MG 24 hr tablet Take 1 tablet (500 mg total) by mouth daily with breakfast. 04/08/24   Corwin Antu, FNP  methocarbamol  (ROBAXIN ) 500 MG tablet Take 2 tablets (1,000 mg total) by mouth every 8 (eight) hours as needed for muscle spasms. 04/08/24   Dugal, Tabitha, FNP  montelukast  (SINGULAIR ) 10 MG tablet Take 1 tablet (10 mg total) by mouth at bedtime. 11/27/23   Antonette [place] ORN, NP  pantoprazole  (PROTONIX ) 40 MG tablet Take 1 tablet (40 mg total) by mouth  daily. 11/27/23   Antonette [place] ORN, NP  pseudoephedrine-guaifenesin (MUCINEX D) 60-600 MG 12 hr tablet Take 1 tablet by mouth every 12 (twelve) hours.    [provider]  Saw Palmetto 450 MG CAPS Take 450 mg by mouth in the morning, at noon, in the evening, and at bedtime.    [provider]  traMADol  (ULTRAM ) 50 MG tablet Take 1 tablet (50 mg total) by mouth daily as needed. 11/27/23   Antonette [place] ORN, NP    Family History Family History  Problem Relation Age of Onset   Heart disease Mother    Arrhythmia Mother    Congestive Heart Failure Father    Heart disease Father    COPD Father    Heart disease Brother        stents at 73 yo   Lupus Brother    Bladder Cancer Paternal Uncle    Heart failure Paternal Grandmother    Kidney cancer Paternal Grandfather    Parkinsonism Neg Hx     Social History Social History   Tobacco Use   Smoking status: Former    Current packs/day: 0.00    Average packs/day: 2.0 packs/day for 20.0 years (40.0 ttl pk-yrs)    Types: Cigarettes    Start date: 10/09/1986     Quit date: 10/09/2006    Years since quitting: 17.6   Smokeless tobacco: Never  Vaping Use   Vaping status: Never Used  Substance Use Topics   Alcohol use: Not Currently    Alcohol/week: 1.0 standard drink of alcohol    Types: 1 Cans of beer per week    Comment: rarely   Drug use: No     Allergies   Augmentin  [amoxicillin -pot clavulanate], Demerol [meperidine], Egg-derived products, Statins, Sulfa antibiotics, and Sulfamethoxazole   Review of Systems Review of Systems   Physical Exam Triage Vital Signs ED Triage Vitals  Encounter Vitals Group     BP 06/06/24 0831 133/79     Girls Systolic BP Percentile --      Girls Diastolic BP Percentile --      Boys Systolic BP Percentile --      Boys Diastolic BP Percentile --      Pulse Rate 06/06/24 0831 (!) 59     Resp 06/06/24 0831 20     Temp 06/06/24 0831 98.4 F (36.9 C)     Temp src --      SpO2 06/06/24 0831 98 %     Weight --      Height --      Head Circumference --      Peak Flow --      Pain Score 06/06/24 0829 0     Pain Loc --      Pain Education --      Exclude from Growth Chart --    No data found.  Updated Vital Signs BP 133/79 (BP Location: Left Arm)   Pulse (!) 59   Temp 98.4 F (36.9 C)   Resp 20   SpO2 98%   Visual Acuity Right Eye Distance:   Left Eye Distance:   Bilateral Distance:    Right Eye Near:   Left Eye Near:    Bilateral Near:     Physical Exam Constitutional:      Appearance: Normal appearance.  HENT:     Left Ear: Tympanic membrane, ear canal and external ear normal.     Ears:     Comments: Erythema and mild swelling present  to the right ear canal, no abnormality to the tympanic membrane, no drainage noted Eyes:     Extraocular Movements: Extraocular movements intact.  Pulmonary:     Effort: Pulmonary effort is normal.  Neurological:     Mental Status: He is alert and oriented to person, place, and time.      UC Treatments / Results  Labs (all labs ordered are  listed, but only abnormal results are displayed) Labs Reviewed - No data to display  EKG   Radiology No results found.  Procedures Procedures (including critical care time)  Medications Ordered in UC Medications - No data to display  Initial Impression / Assessment and Plan / UC Course  I have reviewed the triage vital signs and the nursing notes.  Pertinent labs & imaging results that were available during my care of the patient were reviewed by me and considered in my medical decision making (see chart for details).  Infective otitis externa, right   Presentation concerning for infection, no abnormality to the tympanic membrane, discussed all findings with patient, prescribed Ciprodex  and discussed administration, recommended nonpharmacological supportive care and advised over-the-counter analgesics for pain, advised follow-up if no improvement seen Final Clinical Impressions(s) / UC Diagnoses   Final diagnoses:  Infective otitis externa, right     Discharge Instructions      Today you are being treated for an infection of the ear canal, on exam there is no abnormality to your eardrum but the ear canal is very red and slightly swollen  Use 4 drops of Ciprodex  into the right ear every morning and every evening for 7 days, this is a mixture in the steroid, you should begin to see improvement after 48 hours of medication use and then it should progressively get better  You may use Tylenol  or ibuprofen for management of discomfort  May hold warm compresses to the ear for additional comfort  Please not attempted any ear cleaning or object or fluid placement into the ear canal to prevent further irritation    ED Prescriptions     Medication Sig Dispense Auth. Provider   ciprofloxacin -dexamethasone  (CIPRODEX ) OTIC suspension Place 4 drops into the right ear 2 (two) times daily for 7 days. 2.8 mL Teresa Shelba SAUNDERS, NP      PDMP not reviewed this encounter.   Teresa Shelba SAUNDERS, [place] 06/06/24 [phone]

## 2024-06-11 ENCOUNTER — Other Ambulatory Visit: Payer: Self-pay | Admitting: Family

## 2024-06-13 ENCOUNTER — Encounter: Payer: Self-pay | Admitting: Family

## 2024-06-13 NOTE — Telephone Encounter (Signed)
 This can wait for PCP to address as I am not sure of the context.

## 2024-06-17 ENCOUNTER — Encounter: Payer: Self-pay | Admitting: Family

## 2024-06-17 NOTE — Telephone Encounter (Signed)
Needs office visit to assess

## 2024-06-23 ENCOUNTER — Other Ambulatory Visit: Payer: Self-pay | Admitting: Internal Medicine

## 2024-07-07 ENCOUNTER — Encounter: Payer: Self-pay | Admitting: Pharmacist

## 2024-07-07 NOTE — Progress Notes (Signed)
 Pharmacy Quality Measure Review  Statin Use in Persons with Diabetes (SUPD)  Prior documented history of statin myopathy. Not discussed/billed in the 2025 calendar year.  Note placed on next PCP Encounter 10/6 regarding need for Dx code billed:   G72.0            Drug-induced myopathy  G72.89          Other specified myopathies  G72.9            Myopathy, unspecified  M60.80         Other myositis, unspecified site  M60.9           Myositis, unspecified  M62.82         Rhabdomyolysis  T46.6X5A      Adverse effect of antihyperlipidemic and antiarteriosclerotic drugs

## 2024-07-10 ENCOUNTER — Ambulatory Visit: Admitting: Family

## 2024-07-10 ENCOUNTER — Other Ambulatory Visit: Payer: Self-pay | Admitting: Medical Genetics

## 2024-07-14 ENCOUNTER — Encounter: Payer: Self-pay | Admitting: Family

## 2024-07-14 ENCOUNTER — Ambulatory Visit: Admitting: Family

## 2024-07-14 VITALS — BP 132/76 | HR 58 | Temp 98.4°F | Ht 74.0 in | Wt 267.2 lb

## 2024-07-14 DIAGNOSIS — E119 Type 2 diabetes mellitus without complications: Secondary | ICD-10-CM

## 2024-07-14 DIAGNOSIS — N481 Balanitis: Secondary | ICD-10-CM

## 2024-07-14 DIAGNOSIS — R7989 Other specified abnormal findings of blood chemistry: Secondary | ICD-10-CM

## 2024-07-14 DIAGNOSIS — M51372 Other intervertebral disc degeneration, lumbosacral region with discogenic back pain and lower extremity pain: Secondary | ICD-10-CM

## 2024-07-14 DIAGNOSIS — E038 Other specified hypothyroidism: Secondary | ICD-10-CM | POA: Diagnosis not present

## 2024-07-14 DIAGNOSIS — E559 Vitamin D deficiency, unspecified: Secondary | ICD-10-CM

## 2024-07-14 DIAGNOSIS — F4323 Adjustment disorder with mixed anxiety and depressed mood: Secondary | ICD-10-CM

## 2024-07-14 DIAGNOSIS — Z7984 Long term (current) use of oral hypoglycemic drugs: Secondary | ICD-10-CM

## 2024-07-14 DIAGNOSIS — I10 Essential (primary) hypertension: Secondary | ICD-10-CM

## 2024-07-14 DIAGNOSIS — M5030 Other cervical disc degeneration, unspecified cervical region: Secondary | ICD-10-CM

## 2024-07-14 DIAGNOSIS — E1169 Type 2 diabetes mellitus with other specified complication: Secondary | ICD-10-CM

## 2024-07-14 LAB — COMPREHENSIVE METABOLIC PANEL WITH GFR
ALT: 51 U/L (ref 0–53)
AST: 39 U/L — ABNORMAL HIGH (ref 0–37)
Albumin: 4.5 g/dL (ref 3.5–5.2)
Alkaline Phosphatase: 57 U/L (ref 39–117)
BUN: 17 mg/dL (ref 6–23)
CO2: 29 meq/L (ref 19–32)
Calcium: 9.6 mg/dL (ref 8.4–10.5)
Chloride: 102 meq/L (ref 96–112)
Creatinine, Ser: 1 mg/dL (ref 0.40–1.50)
GFR: 83.41 mL/min (ref >60.00)
Glucose, Bld: 145 mg/dL — ABNORMAL HIGH (ref 70–99)
Potassium: 4.1 meq/L (ref 3.5–5.1)
Sodium: 142 meq/L (ref 135–145)
Total Bilirubin: 2.1 mg/dL — ABNORMAL HIGH (ref 0.2–1.2)
Total Protein: 7 g/dL (ref 6.0–8.3)

## 2024-07-14 LAB — TSH: TSH: 1.43 u[IU]/mL (ref 0.35–5.50)

## 2024-07-14 LAB — T4, FREE: Free T4: 0.68 ng/dL (ref 0.60–1.60)

## 2024-07-14 LAB — VITAMIN D 25 HYDROXY (VIT D DEFICIENCY, FRACTURES): VITD: 31.31 ng/mL (ref 30.00–100.00)

## 2024-07-14 LAB — HEMOGLOBIN A1C: Hgb A1c MFr Bld: 7.1 % — ABNORMAL HIGH (ref 4.6–6.5)

## 2024-07-14 MED ORDER — GABAPENTIN 400 MG PO CAPS: 400.0000 mg | ORAL_CAPSULE | Freq: Two times a day (BID) | ORAL | 0 refills | Status: AC

## 2024-07-14 MED ORDER — METFORMIN HCL ER 500 MG PO TB24: 500.0000 mg | ORAL_TABLET | Freq: Every day | ORAL | 0 refills | Status: AC

## 2024-07-14 MED ORDER — FLUCONAZOLE 150 MG PO TABS: ORAL_TABLET | ORAL | 0 refills | Status: AC

## 2024-07-14 MED ORDER — HYDROXYZINE PAMOATE 25 MG PO CAPS: 25.0000 mg | ORAL_CAPSULE | ORAL | 1 refills | Status: AC | PRN

## 2024-07-14 MED ORDER — BUPROPION HCL ER (XL) 300 MG PO TB24: 300.0000 mg | ORAL_TABLET | Freq: Every day | ORAL | 0 refills | Status: DC

## 2024-07-14 MED ORDER — EZETIMIBE 10 MG PO TABS
10.0000 mg | ORAL_TABLET | Freq: Every day | ORAL | 3 refills | Status: AC
Start: 2024-07-14 — End: ?

## 2024-07-14 MED ORDER — CITALOPRAM HYDROBROMIDE 40 MG PO TABS
40.0000 mg | ORAL_TABLET | Freq: Every day | ORAL | 1 refills | Status: AC
Start: 2024-07-14 — End: ?

## 2024-07-14 MED ORDER — CLONAZEPAM 0.5 MG PO TABS: 0.5000 mg | ORAL_TABLET | Freq: Every day | ORAL | 0 refills | Status: AC | PRN

## 2024-07-14 MED ORDER — TRAMADOL HCL 50 MG PO TABS: 50.0000 mg | ORAL_TABLET | Freq: Every day | ORAL | 0 refills | Status: AC | PRN

## 2024-07-14 NOTE — Progress Notes (Signed)
 "  Established Patient Office Visit  Subjective:      CC:  Chief Complaint  Patient presents with   Medical Management of Chronic Issues    HPI: Corey Bell is a 58 y.o. male presenting on 07/14/2024 for Medical Management of Chronic Issues .  Discussed the use of AI scribe software for clinical note transcription with the patient, who gave verbal consent to proceed.  History of Present Illness Corey Bell is a 58 year old male who presents for a routine follow-up visit.  He recently experienced a yeast infection, initially managed unsuccessfully with an antifungal cream. He then took an oral antifungal pill recommended by a friend, which resolved the infection overnight.   He has a history of diabetes and was previously on metformin  500 mg twice a day, which was discontinued after starting Jardiance . Since stopping metformin , he has experienced increased thirst and frequent urination. He is concerned about these symptoms.  He experiences back pain flares approximately once a month, for which he takes tramadol  as needed. He prefers not to use hydrocodone due to adverse effects. He also takes Tylenol  and vitamin D  regularly. He has reduced his use of methocarbamol  to one tablet a day.          Social history:000  Relevant past medical, surgical, family and social history reviewed and updated as indicated. Interim medical history since our last visit reviewed.  Allergies and medications reviewed and updated.  DATA REVIEWED: CHART IN EPIC     ROS: Negative unless specifically indicated above in HPI.    Current Outpatient Medications:    blood glucose meter kit and supplies, Dispense based on patient and insurance preference. Use up to four times daily as directed. (FOR ICD-10 E10.9, E11.9)., Disp: 1 each, Rfl: 0   calcium  carbonate (TUMS - DOSED IN MG ELEMENTAL CALCIUM ) 500 MG chewable tablet, Chew 3 tablets by mouth daily., Disp: , Rfl:     diphenhydrAMINE (BENADRYL) 25 mg capsule, Take 50 mg by mouth at bedtime., Disp: , Rfl:    erythromycin  with ethanol (THERAMYCIN) 2 % external solution, APPLY TOPICALLY TWICE A DAY AS NEEDED, Disp: 60 mL, Rfl: 0   Evolocumab  (REPATHA ) 140 MG/ML SOSY, Inject 140 mg into the skin every 14 (fourteen) days., Disp: 6 mL, Rfl: 0   fexofenadine (ALLEGRA) 180 MG tablet, Take 180 mg by mouth daily., Disp: , Rfl:    fluconazole  (DIFLUCAN ) 150 MG tablet, Take one po every day for one dose prn yeast symptoms, repeat in three days if still with symptoms, Disp: 2 tablet, Rfl: 0   fluticasone  (FLONASE ) 50 MCG/ACT nasal spray, Place 2 sprays into both nostrils daily., Disp: 16 g, Rfl: 5   levothyroxine  (SYNTHROID ) 75 MCG tablet, TAKE 1 TABLET BY MOUTH DAILY, Disp: 90 tablet, Rfl: 1   melatonin 1 MG TABS tablet, Take 1 mg by mouth at bedtime., Disp: , Rfl:    methocarbamol  (ROBAXIN ) 500 MG tablet, Take 2 tablets (1,000 mg total) by mouth every 8 (eight) hours as needed for muscle spasms., Disp: 270 tablet, Rfl: 0   pantoprazole  (PROTONIX ) 40 MG tablet, TAKE 1 TABLET BY MOUTH DAILY, Disp: 90 tablet, Rfl: 1   pseudoephedrine-guaifenesin (MUCINEX D) 60-600 MG 12 hr tablet, Take 1 tablet by mouth every 12 (twelve) hours., Disp: , Rfl:    Saw Palmetto 450 MG CAPS, Take 450 mg by mouth in the morning, at noon, in the evening, and at bedtime., Disp: , Rfl:    buPROPion  (  WELLBUTRIN  XL) 300 MG 24 hr tablet, Take 1 tablet (300 mg total) by mouth daily., Disp: 90 tablet, Rfl: 0   citalopram  (CELEXA ) 40 MG tablet, Take 1 tablet (40 mg total) by mouth daily., Disp: 90 tablet, Rfl: 1   clonazePAM  (KLONOPIN ) 0.5 MG tablet, Take 1 tablet (0.5 mg total) by mouth daily as needed. for anxiety, Disp: 20 tablet, Rfl: 0   ezetimibe  (ZETIA ) 10 MG tablet, Take 1 tablet (10 mg total) by mouth daily., Disp: 90 tablet, Rfl: 3   gabapentin  (NEURONTIN ) 400 MG capsule, Take 1 capsule (400 mg total) by mouth 2 (two) times daily., Disp: 180  capsule, Rfl: 0   hydrOXYzine  (VISTARIL ) 25 MG capsule, Take 1 capsule (25 mg total) by mouth every 4 (four) hours as needed., Disp: 90 capsule, Rfl: 1   metFORMIN  (GLUCOPHAGE -XR) 500 MG 24 hr tablet, Take 1 tablet (500 mg total) by mouth daily with breakfast., Disp: 180 tablet, Rfl: 0   traMADol  (ULTRAM ) 50 MG tablet, Take 1 tablet (50 mg total) by mouth daily as needed., Disp: 30 tablet, Rfl: 0        Objective:        BP 132/76 (BP Location: Left Arm, Patient Position: Sitting, Cuff Size: Large)   Pulse (!) 58   Temp 98.4 F (36.9 C) (Temporal)   Ht 6' 2 (1.88 m)   Wt 267 lb 3.2 oz (121.2 kg)   SpO2 97%   BMI 34.31 kg/m   Physical Exam   Wt Readings from Last 3 Encounters:  07/14/24 267 lb 3.2 oz (121.2 kg)  05/13/24 268 lb 6.4 oz (121.7 kg)  04/08/24 264 lb 9.6 oz (120 kg)    Physical Exam Constitutional:      General: He is not in acute distress.    Appearance: Normal appearance. He is normal weight. He is not ill-appearing, toxic-appearing or diaphoretic.  Cardiovascular:     Rate and Rhythm: Normal rate and regular rhythm.  Pulmonary:     Effort: Pulmonary effort is normal.     Breath sounds: Normal breath sounds.  Musculoskeletal:        General: Normal range of motion.  Neurological:     General: No focal deficit present.     Mental Status: He is alert and oriented to person, place, and time. Mental status is at baseline.  Psychiatric:        Mood and Affect: Mood normal.        Behavior: Behavior normal.        Thought Content: Thought content normal.        Judgment: Judgment normal.          Results   Assessment & Plan:   Assessment and Plan Assessment & Plan Type 2 diabetes mellitus Symptoms of polydipsia and polyuria suggest uncontrolled diabetes, possibly related to discontinuation of metformin . Awaiting A1c results for glycemic control assessment. - Restart metformin  extended release 500 mg daily. - Check A1c level to assess  glycemic control.  Chronic low back pain Chronic low back pain with monthly flares managed with tramadol . He prefers to avoid hydrocodone due to adverse effects. - Continue tramadol  as needed for severe pain flares. - Consider Voltaren gel for pain management to minimize systemic effects. - Recommend ice, heat, and elevation for arthritis-related pain. - Consider referral to Dr. Ubaldo for further management if needed.  balanitis Recent yeast infection resolved with oral antifungal medication. Previous antifungal cream was ineffective. - Provide two oral antifungal pills for  future use, with instructions to repeat in three days if symptoms persist.  General Health Maintenance Declined flu vaccine during this visit. - No flu vaccine administered.        Return in about 6 months (around 01/12/2025) for f/u CPE.     Ginger Patrick, MSN, APRN, FNP-C [facility]      "

## 2024-07-15 ENCOUNTER — Encounter: Payer: Self-pay | Admitting: Family

## 2024-07-17 ENCOUNTER — Ambulatory Visit: Payer: Self-pay | Admitting: Family

## 2024-07-17 DIAGNOSIS — R7989 Other specified abnormal findings of blood chemistry: Secondary | ICD-10-CM

## 2024-07-17 DIAGNOSIS — R17 Unspecified jaundice: Secondary | ICD-10-CM

## 2024-07-18 ENCOUNTER — Other Ambulatory Visit
Admission: RE | Admit: 2024-07-18 | Discharge: 2024-07-18 | Disposition: A | Discharge status: Home / Self Care | Payer: Self-pay | Source: Ambulatory Visit | Attending: Medical Genetics | Admitting: Medical Genetics

## 2024-07-21 ENCOUNTER — Other Ambulatory Visit: Payer: Self-pay | Admitting: Cardiology

## 2024-07-21 DIAGNOSIS — I739 Peripheral vascular disease, unspecified: Secondary | ICD-10-CM

## 2024-07-21 DIAGNOSIS — E782 Mixed hyperlipidemia: Secondary | ICD-10-CM

## 2024-07-24 ENCOUNTER — Encounter: Payer: Self-pay | Admitting: Family

## 2024-07-24 DIAGNOSIS — K080 Exfoliation of teeth due to systemic causes: Secondary | ICD-10-CM | POA: Diagnosis not present

## 2024-07-24 NOTE — Telephone Encounter (Signed)
 Looks like Corey Bell had written the patient a small course of tramadol . Has he been taking that or is he out? This may have to wait for her to return

## 2024-07-28 ENCOUNTER — Encounter: Payer: Self-pay | Admitting: Family

## 2024-07-28 ENCOUNTER — Ambulatory Visit
Admission: RE | Admit: 2024-07-28 | Discharge: 2024-07-28 | Disposition: A | Discharge status: Home / Self Care | Source: Ambulatory Visit | Attending: Family | Admitting: Family

## 2024-07-28 ENCOUNTER — Encounter: Payer: Self-pay | Admitting: Pharmacist

## 2024-07-28 DIAGNOSIS — K7689 Other specified diseases of liver: Secondary | ICD-10-CM | POA: Diagnosis not present

## 2024-07-28 DIAGNOSIS — R7989 Other specified abnormal findings of blood chemistry: Secondary | ICD-10-CM | POA: Insufficient documentation

## 2024-07-28 DIAGNOSIS — R17 Unspecified jaundice: Secondary | ICD-10-CM | POA: Insufficient documentation

## 2024-07-28 DIAGNOSIS — R161 Splenomegaly, not elsewhere classified: Secondary | ICD-10-CM | POA: Diagnosis not present

## 2024-07-28 DIAGNOSIS — K8020 Calculus of gallbladder without cholecystitis without obstruction: Secondary | ICD-10-CM | POA: Diagnosis not present

## 2024-07-28 NOTE — Progress Notes (Signed)
 Brief Telephone Documentation Reason for Call: Patient left message regarding question for pharmacist   Summary of Call: Patient received re-enrollment letter from [facility]   Assessment/Plan: [employer] re-enrollment filled out and uploaded to front office eFax folder for patient signature  Discussed need for income documentation (SSI letter, taxes, etc.)   Corey Bell, PharmD Clinical Pharmacist [facility] [phone]

## 2024-07-29 ENCOUNTER — Ambulatory Visit: Payer: Self-pay | Admitting: Family

## 2024-07-29 DIAGNOSIS — I10 Essential (primary) hypertension: Secondary | ICD-10-CM

## 2024-07-29 DIAGNOSIS — R7989 Other specified abnormal findings of blood chemistry: Secondary | ICD-10-CM

## 2024-07-29 DIAGNOSIS — R161 Splenomegaly, not elsewhere classified: Secondary | ICD-10-CM

## 2024-07-29 DIAGNOSIS — K7689 Other specified diseases of liver: Secondary | ICD-10-CM

## 2024-07-29 LAB — GENECONNECT MOLECULAR SCREEN: Genetic Analysis Overall Interpretation: NEGATIVE

## 2024-08-04 ENCOUNTER — Other Ambulatory Visit (INDEPENDENT_AMBULATORY_CARE_PROVIDER_SITE_OTHER)

## 2024-08-04 DIAGNOSIS — R17 Unspecified jaundice: Secondary | ICD-10-CM

## 2024-08-04 DIAGNOSIS — R7989 Other specified abnormal findings of blood chemistry: Secondary | ICD-10-CM

## 2024-08-04 LAB — HEPATIC FUNCTION PANEL
ALT: 42 U/L (ref 0–53)
AST: 30 U/L (ref 0–37)
Albumin: 4.5 g/dL (ref 3.5–5.2)
Alkaline Phosphatase: 60 U/L (ref 39–117)
Bilirubin, Direct: 0.5 mg/dL — ABNORMAL HIGH (ref 0.0–0.3)
Total Bilirubin: 1.7 mg/dL — ABNORMAL HIGH (ref 0.2–1.2)
Total Protein: 7.6 g/dL (ref 6.0–8.3)

## 2024-08-05 ENCOUNTER — Encounter: Payer: Self-pay | Admitting: Family

## 2024-08-05 ENCOUNTER — Telehealth: Payer: Self-pay | Admitting: Family

## 2024-08-05 LAB — HEPATITIS PANEL, ACUTE
Hep A IgM: NONREACTIVE
Hep B C IgM: NONREACTIVE
Hepatitis B Surface Ag: NONREACTIVE
Hepatitis C Ab: NONREACTIVE

## 2024-08-05 NOTE — Telephone Encounter (Signed)
 Type of form received:  Patient Assistance Program Application form  Additional comments:   NA  Received by: Inocente Moats should be Faxed to: NA  Form should be mailed to:  NA  Is patient requesting call for pickup: NA   Form placed:  Provider folder  Attach charge sheet. Yes

## 2024-08-05 NOTE — Telephone Encounter (Signed)
 Forms have been placed in Tabitha's inbox.

## 2024-08-08 ENCOUNTER — Ambulatory Visit (INDEPENDENT_AMBULATORY_CARE_PROVIDER_SITE_OTHER): Admitting: Family

## 2024-08-08 ENCOUNTER — Encounter: Payer: Self-pay | Admitting: Family

## 2024-08-08 VITALS — BP 144/86 | HR 66 | Temp 98.7°F | Ht 74.0 in | Wt 266.0 lb

## 2024-08-08 DIAGNOSIS — R161 Splenomegaly, not elsewhere classified: Secondary | ICD-10-CM | POA: Diagnosis not present

## 2024-08-08 DIAGNOSIS — R17 Unspecified jaundice: Secondary | ICD-10-CM | POA: Diagnosis not present

## 2024-08-08 DIAGNOSIS — G2581 Restless legs syndrome: Secondary | ICD-10-CM | POA: Diagnosis not present

## 2024-08-08 DIAGNOSIS — R220 Localized swelling, mass and lump, head: Secondary | ICD-10-CM

## 2024-08-08 DIAGNOSIS — K760 Fatty (change of) liver, not elsewhere classified: Secondary | ICD-10-CM

## 2024-08-08 DIAGNOSIS — N281 Cyst of kidney, acquired: Secondary | ICD-10-CM

## 2024-08-08 DIAGNOSIS — K7689 Other specified diseases of liver: Secondary | ICD-10-CM

## 2024-08-08 LAB — VITAMIN B12: Vitamin B-12: 326 pg/mL (ref 211–911)

## 2024-08-08 LAB — COMPREHENSIVE METABOLIC PANEL WITH GFR
AG Ratio: 1.7 (calc) (ref 1.0–2.5)
ALT: 95 U/L — ABNORMAL HIGH (ref 9–46)
AST: 61 U/L — ABNORMAL HIGH (ref 10–35)
Albumin: 4.7 g/dL (ref 3.6–5.1)
Alkaline phosphatase (APISO): 58 U/L (ref 35–144)
BUN: 19 mg/dL (ref 7–25)
CO2: 28 mmol/L (ref 20–32)
Calcium: 9.8 mg/dL (ref 8.6–10.3)
Chloride: 103 mmol/L (ref 98–110)
Creat: 1.14 mg/dL (ref 0.70–1.30)
Globulin: 2.8 g/dL (ref 1.9–3.7)
Glucose, Bld: 98 mg/dL (ref 65–139)
Potassium: 4.6 mmol/L (ref 3.5–5.3)
Sodium: 139 mmol/L (ref 135–146)
Total Bilirubin: 1.9 mg/dL — ABNORMAL HIGH (ref 0.2–1.2)
Total Protein: 7.5 g/dL (ref 6.1–8.1)
eGFR: 75 mL/min/1.73m2 (ref >=60)

## 2024-08-08 LAB — CBC WITH DIFFERENTIAL/PLATELET
Basophils Absolute: 0.1 K/uL (ref 0.0–0.1)
Basophils Relative: 1.1 % (ref 0.0–3.0)
Eosinophils Absolute: 0.2 K/uL (ref 0.0–0.7)
Eosinophils Relative: 3.5 % (ref 0.0–5.0)
HCT: 48.7 % (ref 39.0–52.0)
Hemoglobin: 16.8 g/dL (ref 13.0–17.0)
Lymphocytes Relative: 29.9 % (ref 12.0–46.0)
Lymphs Abs: 1.7 K/uL (ref 0.7–4.0)
MCHC: 34.6 g/dL (ref 30.0–36.0)
MCV: 90.1 fl (ref 78.0–100.0)
Monocytes Absolute: 0.5 K/uL (ref 0.1–1.0)
Monocytes Relative: 8.6 % (ref 3.0–12.0)
Neutro Abs: 3.3 K/uL (ref 1.4–7.7)
Neutrophils Relative %: 56.9 % (ref 43.0–77.0)
Platelets: 219 K/uL (ref 150.0–400.0)
RBC: 5.4 Mil/uL (ref 4.22–5.81)
RDW: 12.7 % (ref 11.5–15.5)
WBC: 5.8 K/uL (ref 4.0–10.5)

## 2024-08-08 LAB — LIPASE: Lipase: 31 U/L (ref 7–60)

## 2024-08-08 NOTE — Telephone Encounter (Signed)
 Forms have been faxed in.

## 2024-08-08 NOTE — Patient Instructions (Signed)
  I have sent in your order electronically for the following: ultrasound  at this location below. Please call to schedule the appointment at your convenience  [facility] off [address], [place] [address] Phone [phone]-  8-5 pm

## 2024-08-08 NOTE — Telephone Encounter (Signed)
 Completed in outbox.
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
## 2024-08-11 ENCOUNTER — Ambulatory Visit: Payer: Self-pay | Admitting: Family

## 2024-08-11 DIAGNOSIS — K760 Fatty (change of) liver, not elsewhere classified: Secondary | ICD-10-CM

## 2024-08-11 DIAGNOSIS — K7400 Hepatic fibrosis, unspecified: Secondary | ICD-10-CM

## 2024-08-11 DIAGNOSIS — R7989 Other specified abnormal findings of blood chemistry: Secondary | ICD-10-CM

## 2024-08-12 ENCOUNTER — Other Ambulatory Visit (INDEPENDENT_AMBULATORY_CARE_PROVIDER_SITE_OTHER)

## 2024-08-12 DIAGNOSIS — R7989 Other specified abnormal findings of blood chemistry: Secondary | ICD-10-CM | POA: Diagnosis not present

## 2024-08-12 DIAGNOSIS — K7400 Hepatic fibrosis, unspecified: Secondary | ICD-10-CM | POA: Insufficient documentation

## 2024-08-12 DIAGNOSIS — K760 Fatty (change of) liver, not elsewhere classified: Secondary | ICD-10-CM | POA: Diagnosis not present

## 2024-08-12 LAB — NASH FIBROSURE(R) PLUS
ALPHA 2-MACROGLOBULINS, QN: 349 mg/dL — ABNORMAL HIGH (ref 110–276)
ALT (SGPT) P5P: 61 IU/L — ABNORMAL HIGH (ref 0–55)
AST (SGOT) P5P: 51 IU/L — ABNORMAL HIGH (ref 0–40)
Apolipoprotein A-1: 130 mg/dL (ref 101–178)
Bilirubin, Total: 1.1 mg/dL (ref 0.0–1.2)
Cholesterol, Total: 93 mg/dL — ABNORMAL LOW (ref 100–199)
Fibrosis Score: 0.73 — ABNORMAL HIGH (ref 0.00–0.21)
GGT: 24 IU/L (ref 0–65)
Glucose: 59 mg/dL — ABNORMAL LOW (ref 70–99)
Haptoglobin: 83 mg/dL (ref 29–370)
NASH Score: 0 (ref 0.00–0.25)
Steatosis Score: 0.2 (ref 0.00–0.40)
Triglycerides: 184 mg/dL — ABNORMAL HIGH (ref 0–149)

## 2024-08-12 LAB — HEPATIC FUNCTION PANEL
ALT: 46 U/L (ref 0–53)
AST: 32 U/L (ref 0–37)
Albumin: 4.5 g/dL (ref 3.5–5.2)
Alkaline Phosphatase: 61 U/L (ref 39–117)
Bilirubin, Direct: 0.2 mg/dL (ref 0.0–0.3)
Total Bilirubin: 1.3 mg/dL — ABNORMAL HIGH (ref 0.2–1.2)
Total Protein: 7.6 g/dL (ref 6.0–8.3)

## 2024-08-13 ENCOUNTER — Ambulatory Visit: Payer: Self-pay | Admitting: Family

## 2024-08-13 LAB — IGG, IGA, IGM
IgG (Immunoglobin G), Serum: 1171 mg/dL (ref 600–1640)
IgM, Serum: 84 mg/dL (ref 50–300)
Immunoglobulin A: 225 mg/dL (ref 47–310)

## 2024-08-13 LAB — ANA W/REFLEX: ANA Titer 1: NEGATIVE

## 2024-08-14 ENCOUNTER — Ambulatory Visit
Admission: RE | Admit: 2024-08-14 | Discharge: 2024-08-14 | Disposition: A | Discharge status: Home / Self Care | Source: Ambulatory Visit | Attending: Family | Admitting: Family

## 2024-08-14 ENCOUNTER — Ambulatory Visit (INDEPENDENT_AMBULATORY_CARE_PROVIDER_SITE_OTHER)

## 2024-08-14 ENCOUNTER — Ambulatory Visit: Payer: Self-pay

## 2024-08-14 VITALS — BP 130/80 | HR 61 | Temp 98.0°F | Ht 74.0 in | Wt 261.0 lb

## 2024-08-14 DIAGNOSIS — R221 Localized swelling, mass and lump, neck: Secondary | ICD-10-CM | POA: Diagnosis not present

## 2024-08-14 DIAGNOSIS — J3489 Other specified disorders of nose and nasal sinuses: Secondary | ICD-10-CM | POA: Diagnosis not present

## 2024-08-14 DIAGNOSIS — R220 Localized swelling, mass and lump, head: Secondary | ICD-10-CM | POA: Diagnosis not present

## 2024-08-14 DIAGNOSIS — R519 Headache, unspecified: Secondary | ICD-10-CM

## 2024-08-14 LAB — ANTI-MICROSOMAL ANTIBODY LIVER / KIDNEY: LKM1 Ab: <=20 U (ref <=20.0)

## 2024-08-14 LAB — POC COVID19 BINAXNOW: SARS Coronavirus 2 Ag: NEGATIVE

## 2024-08-14 LAB — ANTI-SMOOTH MUSCLE ANTIBODY, IGG: Actin (Smooth Muscle) Antibody (IGG): <20 U (ref <20)

## 2024-08-14 NOTE — Progress Notes (Signed)
 Subjective:   This visit was conducted in person. The patient gave informed consent to the use of Abridge AI technology to record the contents of the encounter as documented below.   Patient ID: Corey Bell, male    DOB: [birth year], 58 y.o.   MRN: [id number]   Discussed the use of AI scribe software for clinical note transcription with the patient, who gave verbal consent to proceed.  History of Present Illness Corey Bell is a 58 year old male with diabetes and COPD who presents with sinus pressure and cough.  Five days ago, he began experiencing sinus pressure and burning, initially attributing it to allergies due to the changing seasons. The symptoms have progressively worsened, with the pressure now extending to his chest, accompanied by increased coughing, particularly in the morning. He describes coughing up a small piece of tissue resembling 'a piece of cooked rice' once, but otherwise, he is not producing mucus.  He has headaches localized under his eyes and over his left eye. No fever or chills. He experiences a runny nose, primarily with post-nasal drip, but no sore throat or earache. No significant body aches beyond what is usual for him.  He has a history of frequent sinus infections, previously experiencing two to three per year, which have been managed with fexofenadine and a steroid nasal spray, reducing the frequency to about one per year. He has been using Flonase  and Allegra regularly, and recently started taking Sudafed. He also takes hydroxyzine  once daily.  He is disabled and works one day a week. His current symptoms have not significantly impaired his ability to function, although he feels more fatigued than usual. No recent exposure to anyone who is sick.   Review of Systems  All other systems reviewed and are negative.       Allergies  Allergen Reactions   Augmentin  [Amoxicillin -Pot Clavulanate] Diarrhea   Demerol [Meperidine] Other (See  Comments)    goes crazy and has the strength of 10 men   Egg Protein-Containing Drug Products Diarrhea and Nausea And Vomiting   Metformin  And Related Other (See Comments)    GI upset  Can tolerate XR    Statins Rash   Sulfa Antibiotics Rash   Sulfamethoxazole Rash    Blisters form in mouth    Current Outpatient Medications on File Prior to Visit  Medication Sig Dispense Refill   blood glucose meter kit and supplies Dispense based on patient and insurance preference. Use up to four times daily as directed. (FOR ICD-10 E10.9, E11.9). 1 each 0   buPROPion  (WELLBUTRIN  XL) 300 MG 24 hr tablet Take 1 tablet (300 mg total) by mouth daily. 90 tablet 0   calcium  carbonate (TUMS - DOSED IN MG ELEMENTAL CALCIUM ) 500 MG chewable tablet Chew 3 tablets by mouth daily.     citalopram  (CELEXA ) 40 MG tablet Take 1 tablet (40 mg total) by mouth daily. 90 tablet 1   clonazePAM  (KLONOPIN ) 0.5 MG tablet Take 1 tablet (0.5 mg total) by mouth daily as needed. for anxiety 20 tablet 0   diphenhydrAMINE (BENADRYL) 25 mg capsule Take 50 mg by mouth at bedtime.     erythromycin  with ethanol (THERAMYCIN) 2 % external solution APPLY TOPICALLY TWICE A DAY AS NEEDED 60 mL 0   Evolocumab  (REPATHA  SURECLICK) 140 MG/ML SOAJ Inject 140 mg into the skin every 14 (fourteen) days. 6 mL 1   ezetimibe  (ZETIA ) 10 MG tablet Take 1 tablet (10 mg total) by mouth daily.  90 tablet 3   fexofenadine (ALLEGRA) 180 MG tablet Take 180 mg by mouth daily.     fluconazole (DIFLUCAN) 150 MG tablet Take one po every day for one dose prn yeast symptoms, repeat in three days if still with symptoms 2 tablet 0   fluticasone  (FLONASE ) 50 MCG/ACT nasal spray Place 2 sprays into both nostrils daily. 16 g 5   gabapentin  (NEURONTIN ) 400 MG capsule Take 1 capsule (400 mg total) by mouth 2 (two) times daily. 180 capsule 0   hydrOXYzine  (VISTARIL ) 25 MG capsule Take 1 capsule (25 mg total) by mouth every 4 (four) hours as needed. 90 capsule 1    levothyroxine  (SYNTHROID ) 75 MCG tablet TAKE 1 TABLET BY MOUTH DAILY 90 tablet 1   melatonin 1 MG TABS tablet Take 1 mg by mouth at bedtime.     metFORMIN  (GLUCOPHAGE -XR) 500 MG 24 hr tablet Take 1 tablet (500 mg total) by mouth daily with breakfast. 180 tablet 0   methocarbamol  (ROBAXIN ) 500 MG tablet Take 2 tablets (1,000 mg total) by mouth every 8 (eight) hours as needed for muscle spasms. 270 tablet 0   pantoprazole  (PROTONIX ) 40 MG tablet TAKE 1 TABLET BY MOUTH DAILY 90 tablet 1   pseudoephedrine-guaifenesin (MUCINEX D) 60-600 MG 12 hr tablet Take 1 tablet by mouth every 12 (twelve) hours.     Saw Palmetto 450 MG CAPS Take 450 mg by mouth in the morning, at noon, in the evening, and at bedtime.     traMADol  (ULTRAM ) 50 MG tablet Take 1 tablet (50 mg total) by mouth daily as needed. 30 tablet 0   No current facility-administered medications on file prior to visit.    BP 130/80 (BP Location: Left Arm, Patient Position: Sitting, Cuff Size: Large)   Pulse 61   Temp 98 F (36.7 C) (Oral)   Ht 6' 2 (1.88 m)   Wt 261 lb (118.4 kg)   SpO2 95%   BMI 33.51 kg/m   Objective:      Physical Exam  GENERAL: Alert, cooperative, well developed, no acute distress. Obese HEAD: Normocephalic atraumatic. Mild tenderness over left maxillary sinus and tenderness to palpation over left frontal sinus. EYES: Extraocular movements intact BL, pupils round, equal and reactive to light BL, conjunctivae normal BL. EARS: Tympanic membrane, ear canal and external ear normal BL. NOSE: Bilateral turbinate hypertrophy. Mucous membranes are moist. THROAT: No oropharyngeal exudate or posterior oropharyngeal erythema. EXTREMITIES: No cyanosis or edema. NEUROLOGICAL: Oriented to person, place and time, no gait abnormalities, moves all extremities without gross motor or sensory deficit. NECK: No cervical lymphadenopathy       Assessment & Plan:   Assessment & Plan Acute viral rhinosinusitis Symptoms suggest  viral etiology.  No high fevers/chills, symptoms did not briefly improve then acutely worsen, neither have symptoms persisted for longer than 7 days, will refrain from antibiotics at this time.  COVID-negative in the clinic. Will treat the sinus congestion with Afrin and cough with Robitussin, which patient prefers to pick up over-the-counter.  Counseled patient to watch for warning symptoms which would indicate bacterial etiology.    - Afrin nasal spray, one spray each nostril twice daily for three days only. - Robitussin non-drowsy, every six hours as needed. - Monitor for high fever, symptoms not improving or worsening in three to four days, or symptoms improving then worsening.  Occurrence of any of these signs would warrant a course of antibiotics. - Swabbed for COVID-19. - Advised hand sanitization.  Return for worsening of symptoms or failure to improve.   Choya Tornow K Enjoli Tidd, MD  08/14/24     Contains text generated by Abridge.

## 2024-08-14 NOTE — Telephone Encounter (Signed)
 Please get him on the schedule as he has requested an appt for sinus concerns

## 2024-08-14 NOTE — Telephone Encounter (Signed)
 Called and spoke with patient.  He had not seen the fpl group.  He reports that symptoms started last Saturday 08/09/24.  Reports that pain is around his eyes and does have a productive cough.     Copied from CRM #[id number]. Topic: Clinical - Red Word Triage >> Aug 14, 2024  8:45 AM Charlet HERO wrote: Red Word that prompted transfer to Nurse Triage: Patient is calling about sinus pain and drainage he is wanting to make appt with [facility] >> Aug 14, 2024 12:06 PM Terri G wrote: Patient was calling back because he hasn't received a call back to see if he could come in today or tomorrow. Advised him on the msg Dr.Dugal left but he stated he will have to complete it later because he has an appointment to go to.

## 2024-08-14 NOTE — Patient Instructions (Addendum)
 Thank you for visiting [facility] today! Here's what we talked about: - Afrin nasal spray, 1 spray in esach nostril twice daily for 3 days ONLY - Robitussin non drowsy, take every 6hrs as needed - Call clinic if: high fevers 100.33F or more, if symptoms unimproved or worse in the next 3-4 days, if symptoms improve then drastically worsen. This may mean you need re-evaluation for possible antibiotics

## 2024-08-14 NOTE — Telephone Encounter (Signed)
 Called patient, he has not seen MyChart message.  He reports that symptoms began last Saturday 08/09/24.  Pain is around eyes.  No home COVID test.       Copied from CRM #[id number]. Topic: Clinical - Red Word Triage >> Aug 14, 2024  8:45 AM Charlet HERO wrote: Red Word that prompted transfer to Nurse Triage: Patient is calling about sinus pain and drainage he is wanting to make appt with [facility] >> Aug 14, 2024 12:06 PM Terri G wrote: Patient was calling back because he hasn't received a call back to see if he could come in today or tomorrow. Advised him on the msg Dr.Dugal left but he stated he will have to complete it later because he has an appointment to go to.

## 2024-08-14 NOTE — Telephone Encounter (Signed)
 FYI Only or Action Required?: Action required by provider: request for appointment.  Patient was last seen in primary care on 08/08/2024 by Corwin Antu, FNP.  Called Nurse Triage reporting Facial Pain.  Symptoms began Saturday.  Interventions attempted: Rest, hydration, or home remedies.  Symptoms are: unchanged.  Triage Disposition: See HCP Within 4 Hours (Or PCP Triage)  Patient/caregiver understands and will follow disposition?: No, wishes to speak with PCP  Copied from CRM #[id number]. Topic: Clinical - Red Word Triage >> Aug 14, 2024  8:45 AM Charlet HERO wrote: Red Word that prompted transfer to Nurse Triage: Patient is calling about sinus pain and drainage he is wanting to make appt with [facility] Reason for Disposition  [1] Redness or swelling on the cheek, forehead or around the eye AND [2] no fever  Answer Assessment - Initial Assessment Questions Patient reports sinus pain and pressure that started Saturday morning. Patient is concerned for possible sinus infection. Requesting to see PCP.  1. LOCATION: Where does it hurt?      Below eyes 2. ONSET: When did the sinus pain start?  (e.g., hours, days)      Started Saturday morning 3. SEVERITY: How bad is the pain?   (Scale 0-10; or none, mild, moderate or severe)     moderate 4. RECURRENT SYMPTOM: Have you ever had sinus problems before? If Yes, ask: When was the last time? and What happened that time?      yes 5. NASAL CONGESTION: Is the nose blocked? If Yes, ask: Can you open it or must you breathe through your mouth?     no 6. NASAL DISCHARGE: Do you have discharge from your nose? If so ask, What color?     No discharge-unable to blow any discharge out 7. FEVER: Do you have a fever? If Yes, ask: What is it, how was it measured, and when did it start?      no 8. OTHER SYMPTOMS: Do you have any other symptoms? (e.g., sore throat, cough, earache, difficulty breathing)      cough  Protocols used: Sinus Pain or Congestion-A-AH

## 2024-08-14 NOTE — Telephone Encounter (Signed)
 Pt was scheduled to see Dr. Bennett today at 1400.

## 2024-08-14 NOTE — Telephone Encounter (Signed)
 Please see the MyChart message reply(ies) for my assessment and plan.  The patient gave consent for this Medical Advice Message and is aware that it may result in a bill to their insurance company as well as the possibility that this may result in a co-payment or deductible. They are an established patient, but are not seeking medical advice exclusively about a problem treated during an in person or video visit in the last 7 days. I did not recommend an in person or video visit within 7 days of my reply.  I spent a total of 3 minutes cumulative time within 7 days through [employer] Ginger Patrick, FNP

## 2024-08-14 NOTE — Telephone Encounter (Signed)
 Please see the MyChart message reply(ies) for my assessment and plan.  The patient gave consent for this Medical Advice Message and is aware that it may result in a bill to their insurance company as well as the possibility that this may result in a co-payment or deductible. They are an established patient, but are not seeking medical advice exclusively about a problem treated during an in person or video visit in the last 7 days. I did not recommend an in person or video visit within 7 days of my reply.  I spent a total of1  minutes cumulative time within 7 days through [employer] Felicita Horns, FNP

## 2024-08-15 ENCOUNTER — Ambulatory Visit: Payer: Self-pay

## 2024-08-18 ENCOUNTER — Encounter: Payer: Self-pay | Admitting: Family

## 2024-08-18 NOTE — Telephone Encounter (Signed)
 Received approval letter [employer]  thru 10/08/2025 approval letter index

## 2024-08-21 ENCOUNTER — Other Ambulatory Visit: Payer: Self-pay | Admitting: Family

## 2024-08-22 ENCOUNTER — Encounter: Payer: Self-pay | Admitting: Family

## 2024-08-28 DIAGNOSIS — K080 Exfoliation of teeth due to systemic causes: Secondary | ICD-10-CM | POA: Diagnosis not present

## 2024-09-08 DIAGNOSIS — E119 Type 2 diabetes mellitus without complications: Secondary | ICD-10-CM | POA: Diagnosis not present

## 2024-09-22 ENCOUNTER — Emergency Department

## 2024-09-22 ENCOUNTER — Other Ambulatory Visit: Payer: Self-pay

## 2024-09-22 ENCOUNTER — Emergency Department
Admission: EM | Admit: 2024-09-22 | Discharge: 2024-09-23 | Disposition: A | Discharge status: Home / Self Care | Attending: Emergency Medicine | Admitting: Emergency Medicine

## 2024-09-22 DIAGNOSIS — K8020 Calculus of gallbladder without cholecystitis without obstruction: Secondary | ICD-10-CM | POA: Diagnosis not present

## 2024-09-22 DIAGNOSIS — Z7984 Long term (current) use of oral hypoglycemic drugs: Secondary | ICD-10-CM | POA: Diagnosis not present

## 2024-09-22 DIAGNOSIS — J449 Chronic obstructive pulmonary disease, unspecified: Secondary | ICD-10-CM | POA: Diagnosis not present

## 2024-09-22 DIAGNOSIS — E039 Hypothyroidism, unspecified: Secondary | ICD-10-CM | POA: Diagnosis not present

## 2024-09-22 DIAGNOSIS — M545 Low back pain, unspecified: Secondary | ICD-10-CM | POA: Diagnosis present

## 2024-09-22 DIAGNOSIS — R109 Unspecified abdominal pain: Secondary | ICD-10-CM | POA: Diagnosis not present

## 2024-09-22 DIAGNOSIS — M5459 Other low back pain: Secondary | ICD-10-CM | POA: Diagnosis not present

## 2024-09-22 LAB — BASIC METABOLIC PANEL WITH GFR
Anion gap: 12 (ref 5–15)
BUN: 18 mg/dL (ref 6–20)
CO2: 26 mmol/L (ref 22–32)
Calcium: 9.6 mg/dL (ref 8.9–10.3)
Chloride: 99 mmol/L (ref 98–111)
Creatinine, Ser: 1.21 mg/dL (ref 0.61–1.24)
GFR, Estimated: >60 mL/min (ref >60)
Glucose, Bld: 131 mg/dL — ABNORMAL HIGH (ref 70–99)
Potassium: 3.9 mmol/L (ref 3.5–5.1)
Sodium: 137 mmol/L (ref 135–145)

## 2024-09-22 LAB — CBC WITH DIFFERENTIAL/PLATELET
Abs Immature Granulocytes: 0.02 K/uL (ref 0.00–0.07)
Basophils Absolute: 0.1 K/uL (ref 0.0–0.1)
Basophils Relative: 1 %
Eosinophils Absolute: 0.3 K/uL (ref 0.0–0.5)
Eosinophils Relative: 4 %
HCT: 49.1 % (ref 39.0–52.0)
Hemoglobin: 16.9 g/dL (ref 13.0–17.0)
Immature Granulocytes: 0 %
Lymphocytes Relative: 41 %
Lymphs Abs: 2.7 K/uL (ref 0.7–4.0)
MCH: 30.7 pg (ref 26.0–34.0)
MCHC: 34.4 g/dL (ref 30.0–36.0)
MCV: 89.1 fL (ref 80.0–100.0)
Monocytes Absolute: 0.5 K/uL (ref 0.1–1.0)
Monocytes Relative: 7 %
Neutro Abs: 3.1 K/uL (ref 1.7–7.7)
Neutrophils Relative %: 47 %
Platelets: 212 K/uL (ref 150–400)
RBC: 5.51 MIL/uL (ref 4.22–5.81)
RDW: 12.8 % (ref 11.5–15.5)
WBC: 6.7 K/uL (ref 4.0–10.5)
nRBC: 0 % (ref 0.0–0.2)

## 2024-09-22 LAB — URINALYSIS, ROUTINE W REFLEX MICROSCOPIC
Bacteria, UA: NONE SEEN
Bilirubin Urine: NEGATIVE
Glucose, UA: >=500 mg/dL — AB
Ketones, ur: NEGATIVE mg/dL
Leukocytes,Ua: NEGATIVE
Nitrite: NEGATIVE
Protein, ur: NEGATIVE mg/dL
Specific Gravity, Urine: 1.021 (ref 1.005–1.030)
Squamous Epithelial / HPF: 0 /HPF (ref 0–5)
pH: 5 (ref 5.0–8.0)

## 2024-09-22 MED ORDER — LIDOCAINE 5 % EX PTCH
1.0000 | MEDICATED_PATCH | Freq: Once | CUTANEOUS | Status: DC
  Administered 2024-09-22: 1 via TRANSDERMAL
  Filled 2024-09-22: qty 1

## 2024-09-22 MED ORDER — HYDROMORPHONE HCL 1 MG/ML IJ SOLN
1.0000 mg | Freq: Once | INTRAMUSCULAR | Status: AC
  Administered 2024-09-22: 1 mg via INTRAMUSCULAR
  Filled 2024-09-22: qty 1

## 2024-09-22 MED ORDER — KETOROLAC TROMETHAMINE 30 MG/ML IJ SOLN
60.0000 mg | Freq: Once | INTRAMUSCULAR | Status: AC
  Administered 2024-09-22: 60 mg via INTRAMUSCULAR
  Filled 2024-09-22: qty 2

## 2024-09-22 MED ORDER — ONDANSETRON 4 MG PO TBDP
4.0000 mg | ORAL_TABLET | Freq: Once | ORAL | Status: AC
  Administered 2024-09-22: 4 mg via ORAL
  Filled 2024-09-22: qty 1

## 2024-09-22 NOTE — ED Triage Notes (Addendum)
 Pt reports left side flank pain that began earlier tonight. Pt reports pain is worse while urinating. Denies dysuria

## 2024-09-22 NOTE — ED Provider Notes (Signed)
 [facility] Provider Note    Event Date/Time   First MD Initiated Contact with Patient 09/22/24 2335     (approximate)   History   Flank Pain   HPI  Corey Bell is a 58 y.o. male with history of COPD, hypothyroidism who presents to the emergency department complaints of left lower back pain.  Denies any known injury, heavy lifting, fall.  Has had a previous disc replacement in 2012 to his lumbar spine.  No numbness, tingling, focal weakness, bowel or bladder incontinence, fever.  States he does have increased pain in his back when he is trying to go urinate but no dysuria, urinary frequency or urgency.   History provided by patient, wife.    Past Medical History:  Diagnosis Date   Anxiety    Aphthous ulcer 10/04/2012   improving   Arthritis    left index fnger   Carpal tunnel syndrome, bilateral    damaged nerve in neck   Chronic pain syndrome    COPD (chronic obstructive pulmonary disease) (HCC)    mild   Depression    Diverticulitis of colon    Esophageal reflux    H/O acute prostatitis    Hypothyroidism    Insomnia, unspecified    Mixed hyperlipidemia    Peyronie disease 12/20/2012   PER uROLOGY   Seasonal allergies    Sleep apnea    uses cpap   Tympanic membrane perforation    history of    Past Surgical History:  Procedure Laterality Date   COLONOSCOPY WITH PROPOFOL  N/A 12/15/2021   Procedure: COLONOSCOPY WITH PROPOFOL ;  Surgeon: Therisa Bi, MD;  Location: [facility] ENDOSCOPY;  Service: Gastroenterology;  Laterality: N/A;   COLONOSCOPY WITH PROPOFOL  N/A 01/12/2022   Procedure: COLONOSCOPY WITH PROPOFOL ;  Surgeon: Therisa Bi, MD;  Location: [facility] ENDOSCOPY;  Service: Gastroenterology;  Laterality: N/A;   LAPAROSCOPIC GASTRIC SLEEVE RESECTION N/A 07/31/2017   Procedure: LAPAROSCOPIC GASTRIC SLEEVE RESECTION WITH UPPER ENDOSCOPY;  Surgeon: Ethyl Lenis, MD;  Location: [facility] ORS;  Service: General;  Laterality: N/A;   LumbarSacral  Disc Surgery     L5-S1   MENISCUS REPAIR Left    torn meniscus   NESBIT PROCEDURE N/A 08/22/2013   Procedure: 16 DOT PLACTATION;  Surgeon: Oneil JAYSON Rafter, MD;  Location: [facility];  Service: Urology;  Laterality: N/A;   TYMPANOSTOMY TUBE PLACEMENT  10/09/1973   ULNAR COLLATERAL LIGAMENT REPAIR Right 04/17/2019   Procedure: RIGHT THUMB ULNAR COLLATERAL LIGAMENT REPAIR;  Surgeon: Murrell Drivers, MD;  Location: [facility];  Service: Orthopedics;  Laterality: Right;   ULNAR COLLATERAL LIGAMENT REPAIR Right 10/16/2019   Procedure: RIGHT THUMB ULNAR COLLATERAL LIGAMENT RECONSTRUCTION;  Surgeon: Murrell Drivers, MD;  Location: [facility];  Service: Orthopedics;  Laterality: Right;  block in preop   VASECTOMY Bilateral 08/22/2013   Procedure: VASECTOMY;  Surgeon: Oneil JAYSON Rafter, MD;  Location: [facility];  Service: Urology;  Laterality: Bilateral;    MEDICATIONS:  Prior to Admission medications  Medication Sig Start Date End Date Taking? Authorizing Provider  blood glucose meter kit and supplies Dispense based on patient and insurance preference. Use up to four times daily as directed. (FOR ICD-10 E10.9, E11.9). 05/16/22   Antonette Angeline ORN, NP  buPROPion  (WELLBUTRIN  XL) 300 MG 24 hr tablet Take 1 tablet (300 mg total) by mouth daily. 07/14/24   Dugal, Tabitha, FNP  calcium  carbonate (TUMS - DOSED IN MG ELEMENTAL CALCIUM ) 500 MG chewable  tablet Chew 3 tablets by mouth daily.    [provider]  citalopram  (CELEXA ) 40 MG tablet Take 1 tablet (40 mg total) by mouth daily. 07/14/24   Dugal, Tabitha, FNP  clonazePAM  (KLONOPIN ) 0.5 MG tablet Take 1 tablet (0.5 mg total) by mouth daily as needed. for anxiety 07/14/24   Corwin Antu, FNP  diphenhydrAMINE (BENADRYL) 25 mg capsule Take 50 mg by mouth at bedtime.    [provider]  erythromycin  with ethanol (THERAMYCIN) 2 % external solution APPLY TOPICALLY TWICE A DAY AS NEEDED 08/21/24    Corwin Antu, FNP  Evolocumab  (REPATHA  SURECLICK) 140 MG/ML SOAJ Inject 140 mg into the skin every 14 (fourteen) days. 07/21/24   Gerard Frederick, NP  ezetimibe  (ZETIA ) 10 MG tablet Take 1 tablet (10 mg total) by mouth daily. 07/14/24   Dugal, Tabitha, FNP  fexofenadine (ALLEGRA) 180 MG tablet Take 180 mg by mouth daily.    [provider]  fluconazole  (DIFLUCAN ) 150 MG tablet Take one po every day for one dose prn yeast symptoms, repeat in three days if still with symptoms 07/14/24   Corwin Antu, FNP  fluticasone  (FLONASE ) 50 MCG/ACT nasal spray Place 2 sprays into both nostrils daily. 05/24/23   Antonette Angeline ORN, NP  gabapentin  (NEURONTIN ) 400 MG capsule Take 1 capsule (400 mg total) by mouth 2 (two) times daily. 07/14/24   Dugal, Tabitha, FNP  hydrOXYzine  (VISTARIL ) 25 MG capsule Take 1 capsule (25 mg total) by mouth every 4 (four) hours as needed. 07/14/24   Dugal, Tabitha, FNP  levothyroxine  (SYNTHROID ) 75 MCG tablet TAKE 1 TABLET BY MOUTH DAILY 06/11/24   Dugal, Tabitha, FNP  melatonin 1 MG TABS tablet Take 1 mg by mouth at bedtime.    [provider]  metFORMIN  (GLUCOPHAGE -XR) 500 MG 24 hr tablet Take 1 tablet (500 mg total) by mouth daily with breakfast. 07/14/24   Corwin Antu, FNP  methocarbamol  (ROBAXIN ) 500 MG tablet Take 2 tablets (1,000 mg total) by mouth every 8 (eight) hours as needed for muscle spasms. 04/08/24   Dugal, Tabitha, FNP  pantoprazole  (PROTONIX ) 40 MG tablet TAKE 1 TABLET BY MOUTH DAILY 06/25/24   Dugal, Tabitha, FNP  pseudoephedrine-guaifenesin (MUCINEX D) 60-600 MG 12 hr tablet Take 1 tablet by mouth every 12 (twelve) hours.    [provider]  Saw Palmetto 450 MG CAPS Take 450 mg by mouth in the morning, at noon, in the evening, and at bedtime.    [provider]  traMADol  (ULTRAM ) 50 MG tablet Take 1 tablet (50 mg total) by mouth daily as needed. 07/14/24   Corwin Antu, FNP    Physical Exam   Triage Vital Signs: ED Triage Vitals   Encounter Vitals Group     BP 09/22/24 2054 139/89     Girls Systolic BP Percentile --      Girls Diastolic BP Percentile --      Boys Systolic BP Percentile --      Boys Diastolic BP Percentile --      Pulse Rate 09/22/24 2054 (!) 53     Resp 09/22/24 2054 16     Temp 09/22/24 2054 (!) 97.5 F (36.4 C)     Temp src --      SpO2 09/22/24 2054 100 %     Weight 09/22/24 2055 250 lb (113.4 kg)     Height 09/22/24 2055 6' 2 (1.88 m)     Head Circumference --      Peak Flow --  Pain Score 09/22/24 2059 9     Pain Loc --      Pain Education --      Exclude from Growth Chart --     Most recent vital signs: Vitals:   09/22/24 2054 09/22/24 2354  BP: 139/89 137/76  Pulse: (!) 53 (!) 45  Resp: 16 18  Temp: (!) 97.5 F (36.4 C) 97.7 F (36.5 C)  SpO2: 100% 94%    CONSTITUTIONAL: Alert, responds appropriately to questions.  Appears uncomfortable when trying to sit upright on the stretcher for examination HEAD: Normocephalic, atraumatic EYES: Conjunctivae clear, pupils appear equal, sclera nonicteric ENT: normal nose; moist mucous membranes NECK: Supple, normal ROM CARD: RRR; S1 and S2 appreciated RESP: Normal chest excursion without splinting or tachypnea; breath sounds clear and equal bilaterally; no wheezes, no rhonchi, no rales, no hypoxia or respiratory distress, speaking full sentences ABD/GI: Non-distended; soft, non-tender, no rebound, no guarding, no peritoneal signs BACK: The back appears normal, no midline spinal tenderness or step-off or deformity.  He is tender over the left lumbar paraspinal muscles without overlying skin changes, soft tissue swelling, ecchymosis, rash. EXT: Normal ROM in all joints; no deformity noted, no edema SKIN: Normal color for age and race; warm; no rash on exposed skin NEURO: Moves all extremities equally, normal speech, normal sensation, no saddle anesthesia, + deep tendon reflexes in bilateral lower extremities, no hyperreflexia or  clonus PSYCH: The patient's mood and manner are appropriate.   ED Results / Procedures / Treatments   LABS: (all labs ordered are listed, but only abnormal results are displayed) Labs Reviewed  BASIC METABOLIC PANEL WITH GFR - Abnormal; Notable for the following components:      Result Value   Glucose, Bld 131 (*)    All other components within normal limits  URINALYSIS, ROUTINE W REFLEX MICROSCOPIC - Abnormal; Notable for the following components:   Color, Urine YELLOW (*)    APPearance CLEAR (*)    Glucose, UA >=500 (*)    Hgb urine dipstick SMALL (*)    All other components within normal limits  CBC WITH DIFFERENTIAL/PLATELET     EKG:  EKG Interpretation Date/Time:    Ventricular Rate:    PR Interval:    QRS Duration:    QT Interval:    QTC Calculation:   R Axis:      Text Interpretation:           RADIOLOGY: My personal review and interpretation of imaging: CT scan shows no acute abnormality.  I have personally reviewed all radiology reports.   CT Renal Stone Study Result Date: 09/22/2024 EXAM: CT ABDOMEN AND PELVIS WITHOUT CONTRAST 09/22/2024 10:04:37 PM TECHNIQUE: CT of the abdomen and pelvis was performed without the administration of intravenous contrast. Multiplanar reformatted images are provided for review. Automated exposure control, iterative reconstruction, and/or weight-based adjustment of the mA/kV was utilized to reduce the radiation dose to as low as reasonably achievable. COMPARISON: 03/20/2024 CLINICAL HISTORY: Abdominal/flank pain, stone suspected. FINDINGS: LOWER CHEST: Mild bibasilar atelectasis. LIVER: The liver is unremarkable. GALLBLADDER AND BILE DUCTS: Cholelithiasis, without associated inflammatory changes. No biliary ductal dilatation. SPLEEN: No acute abnormality. PANCREAS: No acute abnormality. ADRENAL GLANDS: No acute abnormality. KIDNEYS, URETERS AND BLADDER: No stones in the kidneys or ureters. No hydronephrosis. No perinephric or  periureteral stranding. Urinary bladder is unremarkable. GI AND BOWEL: Postsurgical changes related to gastric sleeve. Mild to moderate colonic stool burden. Normal appendix (image 58). There is no bowel obstruction. PERITONEUM AND RETROPERITONEUM:  No ascites. No free air. VASCULATURE: Aorta is normal in caliber. LYMPH NODES: No lymphadenopathy. REPRODUCTIVE ORGANS: Prostate is unremarkable. BONES AND SOFT TISSUES: Mild degenerative changes at L1-L2 with interbody spacer at L5-S1. No focal soft tissue abnormality. IMPRESSION: 1. No acute findings in the abdomen or pelvis. 2. Cholelithiasis, without associated inflammatory changes. Electronically signed by: Pinkie Pebbles MD 09/22/2024 10:21 PM EST RP Workstation: [id number]     PROCEDURES:   Procedures    IMPRESSION / MDM / ASSESSMENT AND PLAN / ED COURSE  I reviewed the triage vital signs and the nursing notes.    Patient here with complaints of low back pain worse with movement.    DIFFERENTIAL DIAGNOSIS (includes but not limited to):   Muscle strain, muscle spasm, lumbar radiculopathy, doubt cardiac, epidural abscess or hematoma, discitis or osteomyelitis, transverse myelitis, fracture.  Less likely kidney stone, UTI, pyelonephritis.   Patient's presentation is most consistent with acute complicated illness / injury requiring diagnostic workup.   PLAN: Labs, urine obtained from triage.  No leukocytosis.  Normal electrolytes and creatinine.  Urine shows no sign of infection, blood.  CT renal study reviewed and interpreted by myself and the radiologist and shows no acute findings.  He has gallstones that he is aware of and has outpatient follow-up already scheduled.  He is not having any right sided abdominal discomfort, fever, vomiting.  I suspect that his pain is more musculoskeletal in nature as it is worse with movement and reproducible with palpation.  No red flag symptoms to suggest that patient needs emergent MRI of his  lumbar spine today.  Will give IM pain medications and reassess.   MEDICATIONS GIVEN IN ED: Medications  lidocaine  (LIDODERM ) 5 % 1 patch (1 patch Transdermal Patch Applied 09/22/24 2357)  HYDROmorphone  (DILAUDID ) injection 1 mg (1 mg Intramuscular Given 09/22/24 2359)  ondansetron  (ZOFRAN -ODT) disintegrating tablet 4 mg (4 mg Oral Given 09/22/24 2356)  ketorolac  (TORADOL ) 30 MG/ML injection 60 mg (60 mg Intramuscular Given 09/22/24 2357)     ED COURSE: Patient reports feeling much better.  He is now ready for discharge home.  Discussed supportive care instructions, return precautions and will discharge with pain medication.  He does have a PCP for follow-up if symptoms not improving with conservative therapy.  Given he has no symptoms currently of lumbar radiculopathy, we will hold on steroids and try anti-inflammatories, rest, stretching, alternating heat and ice, gentle massage.  Patient comfortable with this plan.   At this time, I do not feel there is any life-threatening condition present. I reviewed all nursing notes, vitals, pertinent previous records.  All lab and urine results, EKGs, imaging ordered have been independently reviewed and interpreted by myself.  I reviewed all available radiology reports from any imaging ordered this visit.  Based on my assessment, I feel the patient is safe to be discharged home without further emergent workup and can continue workup as an outpatient as needed. Discussed all findings, treatment plan as well as usual and customary return precautions.  They verbalize understanding and are comfortable with this plan.  Outpatient follow-up has been provided as needed.  All questions have been answered.    CONSULTS:  none   OUTSIDE RECORDS REVIEWED: Reviewed recent family medicine notes.       FINAL CLINICAL IMPRESSION(S) / ED DIAGNOSES   Final diagnoses:  Acute left-sided low back pain without sciatica     Rx / DC Orders   ED Discharge  Orders  Ordered    oxyCODONE  (ROXICODONE ) 5 MG immediate release tablet  Every 8 hours PRN        09/23/24 0053    ondansetron  (ZOFRAN -ODT) 4 MG disintegrating tablet  Every 6 hours PRN        09/23/24 0053    lidocaine  (LIDODERM ) 5 %  Every 24 hours        09/23/24 0053             Note:  This document was prepared using Dragon voice recognition software and may include unintentional dictation errors.   Rudolph Dobler, Josette SAILOR, DO 09/23/24 [id number]

## 2024-09-23 MED ORDER — OXYCODONE HCL 5 MG PO TABS: 5.0000 mg | ORAL_TABLET | Freq: Three times a day (TID) | ORAL | 0 refills | Status: AC | PRN

## 2024-09-23 MED ORDER — ONDANSETRON 4 MG PO TBDP: 4.0000 mg | ORAL_TABLET | Freq: Four times a day (QID) | ORAL | 0 refills | Status: AC | PRN

## 2024-09-23 MED ORDER — LIDOCAINE 5 % EX PTCH: 1.0000 | MEDICATED_PATCH | CUTANEOUS | 0 refills | Status: AC

## 2024-09-23 NOTE — Discharge Instructions (Addendum)
 You may alternate over the counter Tylenol  1000 mg every 6 hours as needed for pain, fever and Ibuprofen 800 mg every 6-8 hours as needed for pain, fever.  Please take Ibuprofen with food.  Do not take more than 4000 mg of Tylenol  (acetaminophen ) in a 24 hour period.   You are being provided a prescription for opiates (also known as narcotics) for pain control.  Opiates can be addictive and should only be used when absolutely necessary for pain control when other alternatives do not work.  We recommend you only use them for the recommended amount of time and only as prescribed.  Please do not take with other sedative medications or alcohol.  Please do not drive, operate machinery, make important decisions while taking opiates.  Please note that these medications can be addictive and have high abuse potential.  Patients can become addicted to narcotics after only taking them for a few days.  Please keep these medications locked away from children, teenagers or any family members with history of substance abuse.  Narcotic pain medicine may also make you constipated.  You may use over-the-counter medications such as MiraLAX, Colace to prevent constipation.  If you become constipated, you may use over-the-counter enemas as needed.  Itching and nausea are also common side effects of narcotic pain medication.  If you develop uncontrolled vomiting or a rash, please stop these medications and seek medical care.   You may alternate heat and ice to this area.  Gentle massage and stretching can help with discomfort as well.  Please no heavy lifting over 5 pounds for the next week.   If symptoms are not improving with conservative management, recommend close follow-up with your primary care doctor.  If you begin having numbness, weakness in both your legs, difficulty holding your bowel or bladder, unable to empty your bladder, fever of 100.4 or higher, unable to ambulate, please return to the emergency department.

## 2024-09-24 ENCOUNTER — Ambulatory Visit: Payer: Self-pay

## 2024-09-24 NOTE — Telephone Encounter (Signed)
° ° ° °  FYI Only or Action Required?: FYI only for provider: appointment scheduled on 12.19.25.  Patient was last seen in primary care on 08/14/2024 by Corey Reuben POUR, MD.  Called Nurse Triage reporting Back Pain.  Symptoms began several days ago.  Interventions attempted: Prescription medications: Oxycodone  and Lidocaine  Patch.  Symptoms are: unchanged.  Triage Disposition: See PCP When Office is Open (Within 3 Days)  Patient/caregiver understands and will follow disposition?: Yes  Copied from CRM #[id number]. Topic: Clinical - Red Word Triage >> Sep 24, 2024  3:58 PM Corey Bell wrote: Red Word that prompted transfer to Nurse Triage: Wife Corey Bell states patient was seen in ER for back pain on 12/15 - still in a lot of pain, unable to bend over, move around much. Needing appt with PCP Reason for Disposition  [1] MODERATE back pain (e.g., interferes with normal activities) AND [2] present > 3 days  Answer Assessment - Initial Assessment Questions 1. ONSET: When did the pain begin? (e.g., minutes, hours, days)      Monday  2. LOCATION: Where does it hurt? (upper, mid or lower back)      Lower back  3. SEVERITY: How bad is the pain?  (e.g., Scale 1-10; mild, moderate, or severe)      7/10  4. PATTERN: Is the pain constant? (e.g., yes, no; constant, intermittent)       constant  5. RADIATION: Does the pain shoot into your legs or somewhere else?      Radiation to abdomen  6. CAUSE:  What do you think is causing the back pain?       Pt states pain started after getting out of chair  7. BACK OVERUSE:  Any recent lifting of heavy objects, strenuous work or exercise?      Denies strenuous activity, work, exercise  8. MEDICINES: What have you taken so far for the pain? (e.g., nothing, acetaminophen , NSAIDS)      Oxycodone , lidocaine  patch, zofran   9. NEUROLOGIC SYMPTOMS: Pt denies weakness, numbness, or problems with bowel/bladder control       10. OTHER SYMPTOMS: Pt  denies fever, abdomen pain, burning with urination, blood in urine. Pt reports difficulty urinating, pain when initiating stream.           Pt reports lower back pain Pt is taking Oxycodone , lidocaine  patch, and zofran  Pt scheduled for a visit on 12.19.25  for further evaluation. Pt seen in ED on 12.15.25 diagnosed with acute lower back pain. Pt agrees with plan of care, will call back for any worsening symptoms  Protocols used: Back Pain-A-AH

## 2024-09-25 NOTE — Telephone Encounter (Signed)
 NOTED

## 2024-09-26 ENCOUNTER — Ambulatory Visit: Admitting: Family

## 2024-09-26 ENCOUNTER — Ambulatory Visit: Payer: Self-pay | Admitting: Family

## 2024-09-26 ENCOUNTER — Encounter: Payer: Self-pay | Admitting: Family

## 2024-09-26 ENCOUNTER — Ambulatory Visit
Admission: RE | Admit: 2024-09-26 | Discharge: 2024-09-26 | Disposition: A | Discharge status: Home / Self Care | Source: Ambulatory Visit | Attending: Family | Admitting: Family

## 2024-09-26 VITALS — BP 126/64 | HR 87 | Temp 98.0°F | Ht 74.0 in | Wt 264.8 lb

## 2024-09-26 DIAGNOSIS — N50812 Left testicular pain: Secondary | ICD-10-CM | POA: Insufficient documentation

## 2024-09-26 DIAGNOSIS — Z7984 Long term (current) use of oral hypoglycemic drugs: Secondary | ICD-10-CM

## 2024-09-26 DIAGNOSIS — K5903 Drug induced constipation: Secondary | ICD-10-CM | POA: Diagnosis not present

## 2024-09-26 DIAGNOSIS — N453 Epididymo-orchitis: Secondary | ICD-10-CM | POA: Diagnosis not present

## 2024-09-26 DIAGNOSIS — E119 Type 2 diabetes mellitus without complications: Secondary | ICD-10-CM | POA: Diagnosis not present

## 2024-09-26 DIAGNOSIS — R339 Retention of urine, unspecified: Secondary | ICD-10-CM | POA: Diagnosis not present

## 2024-09-26 DIAGNOSIS — N481 Balanitis: Secondary | ICD-10-CM

## 2024-09-26 DIAGNOSIS — N5089 Other specified disorders of the male genital organs: Secondary | ICD-10-CM

## 2024-09-26 DIAGNOSIS — I861 Scrotal varices: Secondary | ICD-10-CM

## 2024-09-26 LAB — CBC WITH DIFFERENTIAL/PLATELET
Basophils Absolute: 0.1 K/uL (ref 0.0–0.1)
Basophils Relative: 0.8 % (ref 0.0–3.0)
Eosinophils Absolute: 0.2 K/uL (ref 0.0–0.7)
Eosinophils Relative: 3.1 % (ref 0.0–5.0)
HCT: 47.6 % (ref 39.0–52.0)
Hemoglobin: 16.3 g/dL (ref 13.0–17.0)
Lymphocytes Relative: 27.8 % (ref 12.0–46.0)
Lymphs Abs: 1.8 K/uL (ref 0.7–4.0)
MCHC: 34.3 g/dL (ref 30.0–36.0)
MCV: 91.2 fl (ref 78.0–100.0)
Monocytes Absolute: 0.6 K/uL (ref 0.1–1.0)
Monocytes Relative: 9.3 % (ref 3.0–12.0)
Neutro Abs: 3.9 K/uL (ref 1.4–7.7)
Neutrophils Relative %: 59 % (ref 43.0–77.0)
Platelets: 191 K/uL (ref 150.0–400.0)
RBC: 5.22 Mil/uL (ref 4.22–5.81)
RDW: 13.1 % (ref 11.5–15.5)
WBC: 6.6 K/uL (ref 4.0–10.5)

## 2024-09-26 LAB — BASIC METABOLIC PANEL WITH GFR
BUN: 17 mg/dL (ref 6–23)
CO2: 29 meq/L (ref 19–32)
Calcium: 9.4 mg/dL (ref 8.4–10.5)
Chloride: 102 meq/L (ref 96–112)
Creatinine, Ser: 1.01 mg/dL (ref 0.40–1.50)
GFR: 82.31 mL/min
Glucose, Bld: 98 mg/dL (ref 70–99)
Potassium: 4.2 meq/L (ref 3.5–5.1)
Sodium: 140 meq/L (ref 135–145)

## 2024-09-26 LAB — PSA: PSA: 0.31 ng/mL (ref 0.10–4.00)

## 2024-09-26 LAB — POCT URINE DIPSTICK
Bilirubin, UA: NEGATIVE
Blood, UA: NEGATIVE
Glucose, UA: >=1000 mg/dL — AB
Ketones, POC UA: NEGATIVE mg/dL
Leukocytes, UA: NEGATIVE
Nitrite, UA: NEGATIVE
POC PROTEIN,UA: NEGATIVE
Spec Grav, UA: 1.025
Urobilinogen, UA: 0.2 U/dL
pH, UA: 5.5

## 2024-09-26 MED ORDER — FLUCONAZOLE 150 MG PO TABS: 150.0000 mg | ORAL_TABLET | Freq: Once | ORAL | 0 refills | Status: AC

## 2024-09-26 MED ORDER — LEVOFLOXACIN 750 MG PO TABS: 750.0000 mg | ORAL_TABLET | Freq: Every day | ORAL | 0 refills | Status: AC

## 2024-09-26 NOTE — Patient Instructions (Signed)
 I have sent in your order electronically for the following: ultrasound scrotum  at this location below. Please call to schedule the appointment at your convenience  [facility] outpatient imaging center off [address], [place] [address] Phone [phone]  8-5 pm

## 2024-09-26 NOTE — Progress Notes (Signed)
 "  Established Patient Office Visit  Subjective:      CC:  Chief Complaint  Patient presents with   Follow-up    ED follow up for back pain    HPI: Corey Bell is a 58 y.o. male presenting on 09/26/2024 for Follow-up (ED follow up for back pain) .  Discussed the use of AI scribe software for clinical note transcription with the patient, who gave verbal consent to proceed.  History of Present Illness Corey Bell is a 57 year old male who presents with worsening lower back pain. He is accompanied by his wife.  He has been experiencing worsening lower back pain primarily on the left side since October 15th. The pain intensifies when attempting to urinate, although there is no dysuria, urinary frequency, or urgency. The pain has started to radiate to the front and is described as worse than before. He was seen in the emergency room where he received oxycodone , ketorolac , and hydromorphone , which provided temporary relief. He also uses Aspercreme with lidocaine  as lidocaine  patches do not adhere to his skin.  A CT renal study was performed, showing no kidney stones, but gallstones were noted. The CT also revealed mild to moderate colonic stool burden. He reports having a bowel movement on Wednesday but not since, and he is not currently taking a stool softener.  He is experiencing difficulty initiating urination and reports left testicular pain, though he is unsure if it appears swollen. There is no burning sensation during urination or increased frequency since his ER visit. He is concerned about potential prostatitis or other issues related to his urinary symptoms. 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  He is currently taking Jardiance  10 mg, which has caused yeast infections in the past. He uses antifungal cream as needed and denies any current itchiness or irritation at the penis tip. He does not like the feeling of being 'high' from the  pain medications and finds it difficult to sleep while on them.  He consumes alcohol very rarely, about one or two beers a year, and a few ounces of whiskey. 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 He has been using plain white water  after showers to manage his skin condition.         Social history:  Relevant past medical, surgical, family and social history reviewed and updated as indicated. Interim medical history since our last visit reviewed.  Allergies and medications reviewed and updated.  DATA REVIEWED: CHART IN EPIC     ROS: Negative unless specifically indicated above in HPI.   Current Medications[1]        Objective:        BP 126/64 (BP Location: Left Arm, Patient Position: Sitting, Cuff Size: Large)   Pulse 87   Temp 98 F (36.7 C) (Temporal)   Ht 6' 2 (1.88 m)   Wt 264 lb 12.8 oz (120.1 kg)   SpO2 98%   BMI 34.00 kg/m   Physical Exam MEASUREMENTS: Weight- 264. ABDOMEN: Mild spinal tenderness and abdominal tenderness on palpation. GENITOURINARY: Redness on the left side of the scrotum, hardness on the epididymis, and scrotum hard on palpation.  Wt Readings from Last 3 Encounters:  09/26/24 264 lb 12.8 oz (120.1 kg)  09/22/24 250 lb (113.4 kg)  08/14/24 261 lb (118.4 kg)    Physical Exam Exam conducted with a chaperone present.  Constitutional:  General: He is not in acute distress.    Appearance: Normal appearance. He is normal weight. He is not ill-appearing, toxic-appearing or diaphoretic.  Cardiovascular:     Rate and Rhythm: Normal rate.  Pulmonary:     Effort: Pulmonary effort is normal.  Abdominal:     General: Bowel sounds are normal.     Tenderness: There is abdominal tenderness in the left lower quadrant. There is no right CVA tenderness, left CVA tenderness or guarding.  Genitourinary:    Penis: Lesions (erythema redness tip of penis) present.      Testes:        Left: Tenderness  and swelling present. Left testis is descended.     Epididymis:     Left: Inflamed. Not enlarged. Tenderness present. No mass.  Musculoskeletal:        General: Normal range of motion.     Comments: SI tenderness no spinal tenderness   Neurological:     General: No focal deficit present.     Mental Status: He is alert and oriented to person, place, and time. Mental status is at baseline.  Psychiatric:        Mood and Affect: Mood normal.        Behavior: Behavior normal.        Thought Content: Thought content normal.        Judgment: Judgment normal.          Results Labs Urinalysis (07/23/2024): No evidence of infection or hematuria. Glucosuria present, consistent with SGLT2 inhibitor therapy.  Radiology CT renal stone protocol (07/23/2024): Cholelithiasis. No nephrolithiasis or ureterolithiasis. Urinary bladder unremarkable. Mild to moderate colonic stool burden. (Independently interpreted)  Assessment & Plan:   Assessment and Plan Assessment & Plan Epididymo-orchitis Suspected due to left testicular pain, swelling, and redness. Differential includes orchitis and prostatitis. Pain may be related to infection or inflammation. - Ordered scrotal ultrasound to evaluate for torsion or fluid accumulation. - Prescribed Levaquin 750 mg once daily for 14 days. - Advised use of antifungal cream for yeast infection while on Levaquin. - Instructed to seek emergency care if fever, worsening pain, chills, or confusion occur.  Urinary retention Difficulty initiating urination. Possible prostatitis or other prostate-related issues. No dysuria, urinary frequency, or urgency reported. - Ordered PSA test to evaluate for prostatitis. - Ordered urine dipstick and urinalysis. - Prescribed Levaquin, which may also address potential prostatitis.  Drug-induced constipation Constipation likely secondary to oxycodone  use. Mild to moderate colonic stool burden noted on CT. No bowel leakage or  urinary incontinence reported. - Recommended Miralax or Dulcolax once daily until regular bowel movements are achieved. - Advised use of a stool softener to prevent hard stools.  Type 2 diabetes mellitus Managed with Jardiance . Reports sugar in urine, which is normal with Jardiance . Yeast infections noted as a side effect. - Discontinued Jardiance  due to recurrent yeast infections. - Prescribed Diflucan  for yeast infection. - Advised use of antifungal cream for yeast infection while on Levaquin.        Return in about 3 months (around 12/25/2024) for f/u diabetes.     Ginger Patrick, MSN, APRN, FNP-C [facility]        [1]  Current Outpatient Medications:    blood glucose meter kit and supplies, Dispense based on patient and insurance preference. Use up to four times daily as directed. (FOR ICD-10 E10.9, E11.9)., Disp: 1 each, Rfl: 0   buPROPion  (WELLBUTRIN  XL) 300 MG 24 hr tablet, Take 1 tablet (  300 mg total) by mouth daily., Disp: 90 tablet, Rfl: 0   calcium  carbonate (TUMS - DOSED IN MG ELEMENTAL CALCIUM ) 500 MG chewable tablet, Chew 3 tablets by mouth daily., Disp: , Rfl:    citalopram  (CELEXA ) 40 MG tablet, Take 1 tablet (40 mg total) by mouth daily., Disp: 90 tablet, Rfl: 1   clonazePAM  (KLONOPIN ) 0.5 MG tablet, Take 1 tablet (0.5 mg total) by mouth daily as needed. for anxiety, Disp: 20 tablet, Rfl: 0   diphenhydrAMINE (BENADRYL) 25 mg capsule, Take 50 mg by mouth at bedtime., Disp: , Rfl:    erythromycin  with ethanol (THERAMYCIN) 2 % external solution, APPLY TOPICALLY TWICE A DAY AS NEEDED, Disp: 60 mL, Rfl: 0   Evolocumab  (REPATHA  SURECLICK) 140 MG/ML SOAJ, Inject 140 mg into the skin every 14 (fourteen) days., Disp: 6 mL, Rfl: 1   ezetimibe  (ZETIA ) 10 MG tablet, Take 1 tablet (10 mg total) by mouth daily., Disp: 90 tablet, Rfl: 3   fexofenadine (ALLEGRA) 180 MG tablet, Take 180 mg by mouth daily., Disp: , Rfl:    fluconazole  (DIFLUCAN ) 150 MG  tablet, Take 1 tablet (150 mg total) by mouth once for 1 dose., Disp: 1 tablet, Rfl: 0   fluticasone  (FLONASE ) 50 MCG/ACT nasal spray, Place 2 sprays into both nostrils daily., Disp: 16 g, Rfl: 5   gabapentin  (NEURONTIN ) 400 MG capsule, Take 1 capsule (400 mg total) by mouth 2 (two) times daily., Disp: 180 capsule, Rfl: 0   hydrOXYzine  (VISTARIL ) 25 MG capsule, Take 1 capsule (25 mg total) by mouth every 4 (four) hours as needed., Disp: 90 capsule, Rfl: 1   levofloxacin (LEVAQUIN) 750 MG tablet, Take 1 tablet (750 mg total) by mouth daily for 14 days., Disp: 14 tablet, Rfl: 0   levothyroxine  (SYNTHROID ) 75 MCG tablet, TAKE 1 TABLET BY MOUTH DAILY, Disp: 90 tablet, Rfl: 1   lidocaine  (LIDODERM ) 5 %, Place 1 patch onto the skin daily for 10 days. Remove & Discard patch within 12 hours or as directed by MD, Disp: 10 patch, Rfl: 0   melatonin 1 MG TABS tablet, Take 1 mg by mouth at bedtime., Disp: , Rfl:    metFORMIN  (GLUCOPHAGE -XR) 500 MG 24 hr tablet, Take 1 tablet (500 mg total) by mouth daily with breakfast., Disp: 180 tablet, Rfl: 0   methocarbamol  (ROBAXIN ) 500 MG tablet, Take 2 tablets (1,000 mg total) by mouth every 8 (eight) hours as needed for muscle spasms., Disp: 270 tablet, Rfl: 0   ondansetron  (ZOFRAN -ODT) 4 MG disintegrating tablet, Take 1 tablet (4 mg total) by mouth every 6 (six) hours as needed for nausea or vomiting., Disp: 20 tablet, Rfl: 0   oxyCODONE  (ROXICODONE ) 5 MG immediate release tablet, Take 1 tablet (5 mg total) by mouth every 8 (eight) hours as needed., Disp: 15 tablet, Rfl: 0   pantoprazole  (PROTONIX ) 40 MG tablet, TAKE 1 TABLET BY MOUTH DAILY, Disp: 90 tablet, Rfl: 1   pseudoephedrine-guaifenesin (MUCINEX D) 60-600 MG 12 hr tablet, Take 1 tablet by mouth every 12 (twelve) hours., Disp: , Rfl:    Saw Palmetto 450 MG CAPS, Take 450 mg by mouth in the morning, at noon, in the evening, and at bedtime., Disp: , Rfl:    traMADol  (ULTRAM ) 50 MG tablet, Take 1 tablet (50 mg total)  by mouth daily as needed., Disp: 30 tablet, Rfl: 0  "

## 2024-09-29 ENCOUNTER — Encounter: Payer: Self-pay | Admitting: Family

## 2024-09-30 DIAGNOSIS — I861 Scrotal varices: Secondary | ICD-10-CM | POA: Insufficient documentation

## 2024-09-30 DIAGNOSIS — N5089 Other specified disorders of the male genital organs: Secondary | ICD-10-CM | POA: Insufficient documentation

## 2024-10-05 NOTE — Progress Notes (Unsigned)
 "   10/06/2024 Corey Bell [id number] 1965/10/22  Gastroenterology Office Note    Referring Provider: Corwin Antu, FNP Primary Care Physician:  Corwin Antu, FNP  Primary GI Provider: Jinny Carmine, MD    Chief Complaint   Chief Complaint  Patient presents with   Establish Care    He was told by pcp to see GI because of fatty liver and elevated liver       History of Present Illness   Corey Bell is a 58 y.o. male with PMHX of diabetes, COPD presenting today at the request of Dugal, Antu, FNP due to elevated liver enzymes.   Patient reports having history of fatty liver. He states he rarely drinks alcohol, may have had 1 drink in the last year.  He does have history of hyperlipidemia and has been on Repatha  since February 2025.  Patient reports he is working on trying to lose weight.  He had gastric sleeve surgery 6 or 7 years ago and weighed 312 pounds at that time.  Denies shortness of breath or abdominal swelling.   Patient states he takes a stool softener daily and has a formed bowel movement daily.  He tries to drink 64 ounces of water  and about 4 cups of coffee daily.  He endorses taking ibuprofen for back pain 400 mg every 6 hours, he is no longer taking oxycodone .   08/12/2024 ANA SMA, Anti LKM, negative IgG, IgA, IgM WNL  08/04/2024  Hep A Igm nonreactive HBsAg nonreactive HepC Ab nonreactive   Patient seen by PCP on 08/08/2024 and FibroSure was ordered and labs for hepatic autoimmune workup.   09/22/2024 CT Renal stone study - Mild to moderate colonic stool burden.  Otherwise no acute findings in the abdomen or pelvis - Cholelithiasis, without associated inflammatory changes.   07/28/2024 ultrasound abdomen complete IMPRESSION: 1. Diffuse increased echogenicity of the hepatic parenchyma is a nonspecific indicator of hepatocellular dysfunction, most commonly steatosis. 2. Borderline splenomegaly. 3. Cholelithiasis.  01/13/2022 Colonoscopy -  One 3 mm polyp in the cecum, removed with a cold biopsy forceps. Resected and retrieved. - Non-bleeding internal hemorrhoids.  - The examination was otherwise normal on direct and retroflexion views. - Repeat in 7 years for surveillance  Past Medical History:  Diagnosis Date   Anxiety    Aphthous ulcer 10/04/2012   improving   Arthritis    left index fnger   Carpal tunnel syndrome, bilateral    damaged nerve in neck   Chronic pain syndrome    COPD (chronic obstructive pulmonary disease) (HCC)    mild   Depression    Diverticulitis of colon    Esophageal reflux    H/O acute prostatitis    Hypothyroidism    Insomnia, unspecified    Mixed hyperlipidemia    Peyronie disease 12/20/2012   PER uROLOGY   Seasonal allergies    Sleep apnea    uses cpap   Tympanic membrane perforation    history of    Past Surgical History:  Procedure Laterality Date   COLONOSCOPY WITH PROPOFOL  N/A 12/15/2021   Procedure: COLONOSCOPY WITH PROPOFOL ;  Surgeon: Therisa Bi, MD;  Location: [facility] ENDOSCOPY;  Service: Gastroenterology;  Laterality: N/A;   COLONOSCOPY WITH PROPOFOL  N/A 01/12/2022   Procedure: COLONOSCOPY WITH PROPOFOL ;  Surgeon: Therisa Bi, MD;  Location: [facility] ENDOSCOPY;  Service: Gastroenterology;  Laterality: N/A;   LAPAROSCOPIC GASTRIC SLEEVE RESECTION N/A 07/31/2017   Procedure: LAPAROSCOPIC GASTRIC SLEEVE RESECTION WITH UPPER ENDOSCOPY;  Surgeon: Ethyl Lenis, MD;  Location: [facility];  Service: General;  Laterality: N/A;   LumbarSacral Disc Surgery     L5-S1   MENISCUS REPAIR Left    torn meniscus   NESBIT PROCEDURE N/A 08/22/2013   Procedure: 16 DOT PLACTATION;  Surgeon: Oneil JAYSON Rafter, MD;  Location: [facility];  Service: Urology;  Laterality: N/A;   TYMPANOSTOMY TUBE PLACEMENT  10/09/1973   ULNAR COLLATERAL LIGAMENT REPAIR Right 04/17/2019   Procedure: RIGHT THUMB ULNAR COLLATERAL LIGAMENT REPAIR;  Surgeon: Murrell Drivers, MD;  Location: [facility];   Service: Orthopedics;  Laterality: Right;   ULNAR COLLATERAL LIGAMENT REPAIR Right 10/16/2019   Procedure: RIGHT THUMB ULNAR COLLATERAL LIGAMENT RECONSTRUCTION;  Surgeon: Murrell Drivers, MD;  Location:  SURGERY CENTER;  Service: Orthopedics;  Laterality: Right;  block in preop   VASECTOMY Bilateral 08/22/2013   Procedure: VASECTOMY;  Surgeon: Oneil JAYSON Rafter, MD;  Location: [facility];  Service: Urology;  Laterality: Bilateral;    Current Outpatient Medications  Medication Sig Dispense Refill   blood glucose meter kit and supplies Dispense based on patient and insurance preference. Use up to four times daily as directed. (FOR ICD-10 E10.9, E11.9). 1 each 0   buPROPion  (WELLBUTRIN  XL) 300 MG 24 hr tablet Take 1 tablet (300 mg total) by mouth daily. 90 tablet 0   calcium  carbonate (TUMS - DOSED IN MG ELEMENTAL CALCIUM ) 500 MG chewable tablet Chew 3 tablets by mouth daily.     citalopram  (CELEXA ) 40 MG tablet Take 1 tablet (40 mg total) by mouth daily. 90 tablet 1   clonazePAM  (KLONOPIN ) 0.5 MG tablet Take 1 tablet (0.5 mg total) by mouth daily as needed. for anxiety 20 tablet 0   diphenhydrAMINE (BENADRYL) 25 mg capsule Take 50 mg by mouth at bedtime.     erythromycin  with ethanol (THERAMYCIN) 2 % external solution APPLY TOPICALLY TWICE A DAY AS NEEDED 60 mL 0   Evolocumab  (REPATHA  SURECLICK) 140 MG/ML SOAJ Inject 140 mg into the skin every 14 (fourteen) days. 6 mL 1   ezetimibe  (ZETIA ) 10 MG tablet Take 1 tablet (10 mg total) by mouth daily. 90 tablet 3   fexofenadine (ALLEGRA) 180 MG tablet Take 180 mg by mouth daily.     fluticasone  (FLONASE ) 50 MCG/ACT nasal spray Place 2 sprays into both nostrils daily. 16 g 5   gabapentin  (NEURONTIN ) 400 MG capsule Take 1 capsule (400 mg total) by mouth 2 (two) times daily. 180 capsule 0   hydrOXYzine  (VISTARIL ) 25 MG capsule Take 1 capsule (25 mg total) by mouth every 4 (four) hours as needed. 90 capsule 1   levofloxacin  (LEVAQUIN )  750 MG tablet Take 1 tablet (750 mg total) by mouth daily for 14 days. 14 tablet 0   levothyroxine  (SYNTHROID ) 75 MCG tablet TAKE 1 TABLET BY MOUTH DAILY 90 tablet 1   melatonin 1 MG TABS tablet Take 1 mg by mouth at bedtime.     metFORMIN  (GLUCOPHAGE -XR) 500 MG 24 hr tablet Take 1 tablet (500 mg total) by mouth daily with breakfast. 180 tablet 0   methocarbamol  (ROBAXIN ) 500 MG tablet Take 2 tablets (1,000 mg total) by mouth every 8 (eight) hours as needed for muscle spasms. 270 tablet 0   ondansetron  (ZOFRAN -ODT) 4 MG disintegrating tablet Take 1 tablet (4 mg total) by mouth every 6 (six) hours as needed for nausea or vomiting. 20 tablet 0   oxyCODONE  (ROXICODONE ) 5 MG immediate release tablet Take 1 tablet (5 mg total) by mouth  every 8 (eight) hours as needed. 15 tablet 0   pantoprazole  (PROTONIX ) 40 MG tablet TAKE 1 TABLET BY MOUTH DAILY 90 tablet 1   pseudoephedrine-guaifenesin (MUCINEX D) 60-600 MG 12 hr tablet Take 1 tablet by mouth every 12 (twelve) hours.     Saw Palmetto 450 MG CAPS Take 450 mg by mouth in the morning, at noon, in the evening, and at bedtime.     traMADol  (ULTRAM ) 50 MG tablet Take 1 tablet (50 mg total) by mouth daily as needed. 30 tablet 0   No current facility-administered medications for this visit.    Allergies as of 10/06/2024 - Review Complete 10/06/2024  Allergen Reaction Noted   Augmentin  [amoxicillin -pot clavulanate] Diarrhea 11/24/2013   Demerol [meperidine] Other (See Comments) 07/01/2013   Egg protein-containing drug products Diarrhea and Nausea And Vomiting    Metformin  and related Other (See Comments) 07/14/2024   Statins Rash    Sulfa antibiotics Rash    Sulfamethoxazole Rash 09/23/2015    Family History  Problem Relation Age of Onset   Heart disease Mother    Arrhythmia Mother    Congestive Heart Failure Father    Heart disease Father    COPD Father    Heart disease Brother        stents at 22 yo   Lupus Brother    Bladder Cancer  Paternal Uncle    Heart failure Paternal Grandmother    Kidney cancer Paternal Grandfather    Parkinsonism Neg Hx     Social History   Socioeconomic History   Marital status: Married    Spouse name: Inocente   Number of children: 0   Years of education: college   Highest education level: Associate degree: occupational, scientist, product/process development, or vocational program  Occupational History   Occupation: disabled    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  Tobacco Use   Smoking status: Former    Current packs/day: 0.00    Average packs/day: 2.0 packs/day for 20.0 years (40.0 ttl pk-yrs)    Types: Cigarettes    Start date: 10/09/1986    Quit date: 10/09/2006    Years since quitting: 18.0   Smokeless tobacco: Never  Vaping Use   Vaping status: Never Used  Substance and Sexual Activity   Alcohol use: Not Currently    Alcohol/week: 1.0 standard drink of alcohol    Types: 1 Cans of beer per week    Comment: rarely   Drug use: No   Sexual activity: Yes    Birth control/protection: Surgical  Other Topics Concern   Not on file  Social History Narrative   12/16/19   From: [place]   Living: with wife Inocente, since 1990   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      Family: mother and brothers are nearby      Enjoys: record collection with 10,000 LPs - working on digitizing these, fixing up a 04/24/67 mustang       Exercise: not currently   Diet: over-eating, tries to eat somewhat healthy - salad every other day      Safety   Seat belts: Yes    Guns: Yes  and secure   Safe in relationships: Yes    Social Drivers of Health   Tobacco Use: Medium Risk (10/06/2024)   Patient History    Smoking Tobacco Use: Former    Smokeless Tobacco Use: Never    Passive Exposure: Not on file  Financial Resource Strain: Low Risk (08/05/2024)  Overall Financial Resource Strain (CARDIA)    Difficulty of Paying Living Expenses: Not very hard  Food Insecurity: No Food Insecurity (08/05/2024)   Epic     Worried About Programme Researcher, Broadcasting/film/video in the Last Year: Never true    Ran Out of Food in the Last Year: Never true  Transportation Needs: No Transportation Needs (08/05/2024)   Epic    Lack of Transportation (Medical): No    Lack of Transportation (Non-Medical): No  Physical Activity: Insufficiently Active (08/05/2024)   Exercise Vital Sign    Days of Exercise per Week: 1 day    Minutes of Exercise per Session: 20 min  Stress: No Stress Concern Present (08/05/2024)   [employer] of Occupational Health - Occupational Stress Questionnaire    Feeling of Stress: Not at all  Social Connections: Moderately Integrated (08/05/2024)   Social Connection and Isolation Panel    Frequency of Communication with Friends and Family: More than three times a week    Frequency of Social Gatherings with Friends and Family: Once a week    Attends Religious Services: 1 to 4 times per year    Active Member of Clubs or Organizations: No    Attends Banker Meetings: Not on file    Marital Status: Married  Intimate Partner Violence: Not on file  Depression (PHQ2-9): Medium Risk (02/28/2024)   Depression (PHQ2-9)    PHQ-2 Score: 5  Alcohol Screen: Low Risk (08/05/2024)   Alcohol Screen    Last Alcohol Screening Score (AUDIT): 1  Housing: Low Risk (08/05/2024)   Epic    Unable to Pay for Housing in the Last Year: No    Number of Times Moved in the Last Year: 0    Homeless in the Last Year: No  Utilities: Not on file  Health Literacy: Not on file     RELEVANT GI HISTORY, IMAGING AND LABS: CBC    Component Value Date/Time   WBC 6.6 09/26/2024 1016   RBC 5.22 09/26/2024 1016   HGB 16.3 09/26/2024 1016   HGB 15.2 12/26/2012 1936   HCT 47.6 09/26/2024 1016   HCT 44.6 12/26/2012 1936   PLT 191.0 09/26/2024 1016   PLT 218 12/26/2012 1936   MCV 91.2 09/26/2024 1016   MCV 94 12/26/2012 1936   MCH 30.7 09/22/2024 2101   MCHC 34.3 09/26/2024 1016   RDW 13.1 09/26/2024 1016   RDW 13.3  12/26/2012 1936   LYMPHSABS 1.8 09/26/2024 1016   MONOABS 0.6 09/26/2024 1016   EOSABS 0.2 09/26/2024 1016   BASOSABS 0.1 09/26/2024 1016   Recent Labs    11/27/23 0820 08/08/24 1057 09/22/24 2101 09/26/24 1016  HGB 16.2 16.8 16.9 16.3    CMP     Component Value Date/Time   NA 140 09/26/2024 1016   NA 142 01/27/2018 0000   NA 138 12/26/2012 1936   K 4.2 09/26/2024 1016   K 3.9 12/26/2012 1936   CL 102 09/26/2024 1016   CL 106 12/26/2012 1936   CO2 29 09/26/2024 1016   CO2 24 12/26/2012 1936   GLUCOSE 98 09/26/2024 1016   GLUCOSE 124 (H) 12/26/2012 1936   BUN 17 09/26/2024 1016   BUN 19 01/27/2018 0000   BUN 22 (H) 12/26/2012 1936   CREATININE 1.01 09/26/2024 1016   CREATININE 1.14 02/28/2024 1056   CALCIUM  9.4 09/26/2024 1016   CALCIUM  8.4 (L) 12/26/2012 1936   PROT 7.6 08/12/2024 1029   PROT 6.4 02/23/2014 0817   PROT  7.6 12/18/2012 1412   ALBUMIN  4.5 08/12/2024 1029   ALBUMIN  4.2 02/23/2014 0817   ALBUMIN  3.9 12/18/2012 1412   AST 32 08/12/2024 1029   AST 47 (H) 12/18/2012 1412   ALT 46 08/12/2024 1029   ALT 66 12/18/2012 1412   ALKPHOS 61 08/12/2024 1029   ALKPHOS 78 12/18/2012 1412   BILITOT 1.3 (H) 08/12/2024 1029   BILITOT 1.1 (H) 12/18/2012 1412   GFRNONAA >60 09/22/2024 2101   GFRNONAA 58 (L) 12/26/2012 1936   GFRAA >60 06/02/2020 0431   GFRAA >60 12/26/2012 1936      Latest Ref Rng & Units 08/12/2024   10:29 AM 08/04/2024    7:28 AM 07/14/2024    8:26 AM  Hepatic Function  Total Protein 6.0 - 8.3 g/dL 7.6  7.6  7.0   Albumin  3.5 - 5.2 g/dL 4.5  4.5  4.5   AST 0 - 37 U/L 32  30  39   ALT 0 - 53 U/L 46  42  51   Alk Phosphatase 39 - 117 U/L 61  60  57   Total Bilirubin 0.2 - 1.2 mg/dL 1.3  1.7  2.1   Bilirubin, Direct 0.0 - 0.3 mg/dL 0.2  0.5        Review of Systems   All systems reviewed and negative except where noted in HPI.    Physical Exam  BP (!) 142/80   Pulse 68   Temp 98.4 F (36.9 C) (Oral)   Ht 6' 2 (1.88 m)   Wt 266  lb 2 oz (120.7 kg)   SpO2 95%   BMI 34.17 kg/m  No LMP for male patient. General:   Alert and oriented. Pleasant and cooperative. Well-nourished and well-developed.  Head:  Normocephalic and atraumatic. In no acute distress. Eyes:  Without icterus Ears:  Normal auditory acuity. Neck:  Supple; no masses or thyromegaly. Lungs:  Respirations even and unlabored.  Clear throughout to auscultation.   No wheezes, crackles, or rhonchi. No acute distress. Heart:  Regular rate and rhythm; no murmurs, clicks, rubs, or gallops. Abdomen:  Normal bowel sounds.  No bruits.  Soft, non-tender and non-distended without masses, hepatosplenomegaly or hernias noted.  No guarding or rebound tenderness.    Rectal:  Deferred. Msk:  Symmetrical without gross deformities. Normal posture. Extremities:  Without edema. Neurologic:  Alert and  oriented x4;  grossly normal neurologically. Skin:  Intact without significant lesions or rashes. Psych:  Alert and cooperative. Normal mood and affect.   Assessment & Plan   Corey Bell is a 58 y.o. male presenting today due to elevated liver enzymes and fatty liver.  His PCP recently ordered labs to check for acute hepatitis, which was negative. [employer] testing also all within normal limits. Last hepatic function panel on 08/12/2024 showed total bilirubin 1.3 and other liver enzymes have all normalized. Hollie FibroSure with fibrosis score of 0.73.   - will order ultrasound with elastography to assess fibrosis.  - will recheck LFTs and assess for immunity to Hep B and A.  Discussed need for vaccination if patient does not have immunity. - Discussed Mediterranean diet and weight loss to help improve fatty liver.  I discussed the assessment and treatment plan with the patient. The patient was provided an opportunity to ask questions and all were answered. The patient agreed with the plan and demonstrated an understanding of the instructions.   The patient was advised to  call back or seek an in-person evaluation if  the symptoms worsen or if the condition fails to improve as anticipated.  Follow up in 3 months.   Grayce Bohr, DNP, AGNP-C [facility]  "

## 2024-10-06 ENCOUNTER — Encounter: Payer: Self-pay | Admitting: Family Medicine

## 2024-10-06 ENCOUNTER — Ambulatory Visit: Admitting: Family Medicine

## 2024-10-06 VITALS — BP 142/80 | HR 68 | Temp 98.4°F | Ht 74.0 in | Wt 266.1 lb

## 2024-10-06 DIAGNOSIS — R748 Abnormal levels of other serum enzymes: Secondary | ICD-10-CM

## 2024-10-06 DIAGNOSIS — R7989 Other specified abnormal findings of blood chemistry: Secondary | ICD-10-CM

## 2024-10-06 DIAGNOSIS — K760 Fatty (change of) liver, not elsewhere classified: Secondary | ICD-10-CM

## 2024-10-06 NOTE — Patient Instructions (Addendum)
 Please call [phone] to reschedule your ultrasound if needed.   Ultrasound is schedule on Monday, October 14, 2023 at 8:30 am.    Try Mediterranean Diet for fatty liver.   Try Miralax instead of a stimulant laxative     For constipation: Start OTC Miralax Powder Mix 1/2- 1 capful in 6 to 8 ounces of a drink once daily  Recommend high-fiber diet, 30 g of fiber daily Eat fruits, vegetables, and whole grains Drink 64 ounces of water  / fluids daily.

## 2024-10-07 ENCOUNTER — Encounter: Payer: Self-pay | Admitting: Family Medicine

## 2024-10-07 LAB — HEPATITIS B SURFACE ANTIBODY,QUALITATIVE: Hep B Surface Ab, Qual: NONREACTIVE

## 2024-10-07 LAB — HEPATIC FUNCTION PANEL
ALT: 40 IU/L (ref 0–44)
AST: 32 IU/L (ref 0–40)
Albumin: 4.5 g/dL (ref 3.8–4.9)
Alkaline Phosphatase: 79 IU/L (ref 47–123)
Bilirubin Total: 1.1 mg/dL (ref 0.0–1.2)
Bilirubin, Direct: 0.32 mg/dL (ref 0.00–0.40)
Total Protein: 7.4 g/dL (ref 6.0–8.5)

## 2024-10-07 LAB — HEPATITIS A ANTIBODY, TOTAL: hep A Total Ab: POSITIVE — AB

## 2024-10-07 LAB — ALPHA-1-ANTITRYPSIN: A-1 Antitrypsin: 122 mg/dL (ref 101–187)

## 2024-10-07 LAB — HEPATITIS B CORE ANTIBODY, TOTAL: Hep B Core Total Ab: NEGATIVE

## 2024-10-08 ENCOUNTER — Ambulatory Visit: Payer: Self-pay | Admitting: Family Medicine

## 2024-10-13 ENCOUNTER — Ambulatory Visit
Admission: RE | Admit: 2024-10-13 | Discharge: 2024-10-13 | Disposition: A | Discharge status: Home / Self Care | Source: Ambulatory Visit | Attending: Family Medicine | Admitting: Family Medicine

## 2024-10-13 DIAGNOSIS — R748 Abnormal levels of other serum enzymes: Secondary | ICD-10-CM | POA: Diagnosis present

## 2024-10-13 DIAGNOSIS — K760 Fatty (change of) liver, not elsewhere classified: Secondary | ICD-10-CM | POA: Diagnosis present

## 2024-10-20 DIAGNOSIS — N5082 Scrotal pain: Secondary | ICD-10-CM | POA: Insufficient documentation

## 2024-10-20 NOTE — Progress Notes (Unsigned)
 "  10/20/2024 9:03 AM   Corey Bell Dec 19, 1965 [id number]   HPI: 59 y.o. male here for initial evaluation of Left scrotal pain   ED visit (09/26/24) - left scrotal pain  - scrotal US  - small bilateral varicoceles, small subcentimeter Right epididymal cysts  Hx of prior vasectomy Hx of prior penile plication for PD (2014, Dr. Ottelin)  Hx of prior prostatitis  Hx of COPD    PMH: Past Medical History:  Diagnosis Date   Anxiety    Aphthous ulcer 10/04/2012   improving   Arthritis    left index fnger   Carpal tunnel syndrome, bilateral    damaged nerve in neck   Chronic pain syndrome    COPD (chronic obstructive pulmonary disease) (HCC)    mild   Depression    Diverticulitis of colon    Esophageal reflux    H/O acute prostatitis    Hypothyroidism    Insomnia, unspecified    Mixed hyperlipidemia    Peyronie disease 12/20/2012   PER uROLOGY   Seasonal allergies    Sleep apnea    uses cpap   Tympanic membrane perforation    history of    Surgical History: Past Surgical History:  Procedure Laterality Date   COLONOSCOPY WITH PROPOFOL  N/A 12/15/2021   Procedure: COLONOSCOPY WITH PROPOFOL ;  Surgeon: Therisa Bi, MD;  Location: [facility] ENDOSCOPY;  Service: Gastroenterology;  Laterality: N/A;   COLONOSCOPY WITH PROPOFOL  N/A 01/12/2022   Procedure: COLONOSCOPY WITH PROPOFOL ;  Surgeon: Therisa Bi, MD;  Location: [facility] ENDOSCOPY;  Service: Gastroenterology;  Laterality: N/A;   LAPAROSCOPIC GASTRIC SLEEVE RESECTION N/A 07/31/2017   Procedure: LAPAROSCOPIC GASTRIC SLEEVE RESECTION WITH UPPER ENDOSCOPY;  Surgeon: Ethyl Lenis, MD;  Location: [facility];  Service: General;  Laterality: N/A;   LumbarSacral Disc Surgery     L5-S1   MENISCUS REPAIR Left    torn meniscus   NESBIT PROCEDURE N/A 08/22/2013   Procedure: 16 DOT PLACTATION;  Surgeon: Oneil JAYSON Rafter, MD;  Location: [facility];  Service: Urology;  Laterality: N/A;   TYMPANOSTOMY TUBE PLACEMENT   10/09/1973   ULNAR COLLATERAL LIGAMENT REPAIR Right 04/17/2019   Procedure: RIGHT THUMB ULNAR COLLATERAL LIGAMENT REPAIR;  Surgeon: Murrell Drivers, MD;  Location: [facility];  Service: Orthopedics;  Laterality: Right;   ULNAR COLLATERAL LIGAMENT REPAIR Right 10/16/2019   Procedure: RIGHT THUMB ULNAR COLLATERAL LIGAMENT RECONSTRUCTION;  Surgeon: Murrell Drivers, MD;  Location: [facility];  Service: Orthopedics;  Laterality: Right;  block in preop   VASECTOMY Bilateral 08/22/2013   Procedure: VASECTOMY;  Surgeon: Oneil JAYSON Rafter, MD;  Location: [facility];  Service: Urology;  Laterality: Bilateral;    Family History: Family History  Problem Relation Age of Onset   Heart disease Mother    Arrhythmia Mother    Congestive Heart Failure Father    Heart disease Father    COPD Father    Heart disease Brother        stents at 59 yo   Lupus Brother    Bladder Cancer Paternal Uncle    Heart failure Paternal Grandmother    Kidney cancer Paternal Grandfather    Parkinsonism Neg Hx     Social History:  reports that he quit smoking about 18 years ago. His smoking use included cigarettes. He started smoking about 38 years ago. He has a 40 pack-year smoking history. He has never used smokeless tobacco. He reports that he does not currently use alcohol after  a past usage of about 1.0 standard drink of alcohol per week. He reports that he does not use drugs.      Physical Exam: There were no vitals taken for this visit.   Constitutional:  Alert and oriented, No acute distress. Cardiovascular: No clubbing, cyanosis, or edema. Respiratory: Normal respiratory effort, no increased work of breathing. GI: Nondistended GU: *** Skin: No rashes, bruises or suspicious lesions. Neurologic: Grossly intact, no focal deficits, moving all 4 extremities. Psychiatric: Normal mood and affect.  Laboratory Data: ***   Pertinent Imaging: I have personally viewed and  interpreted the ***.    Assessment & Plan:    Scrotal pain Assessment & Plan: Scrotal US  (Dec 2025)- small bilateral varicoceles, small subcentimeter Right epididymal cysts  We reviewed the common causes of scrotal pain and the basic tenets of management. For many patients, symptoms are self-limited and respond to conservative management. Initial strategies include NSAIDs, scrotal support, ice packs, and/or warm sitz baths. Identifying exacerbating triggers, modification of activities and/or premedication can help reduce episodes and severity. For chronic syndromes, we may consider pelvic floor physical therapy or referral to a chronic pain specialist. In select cases, we may consider surgical treatment with anesthetic cord blocks +/- spermatic cord denervation.         Penne Skye, MD 10/20/2024  [facility] [address] [place], [place] [address] [phone] "

## 2024-10-20 NOTE — Assessment & Plan Note (Signed)
 Scrotal US  (Dec 2025)- small bilateral varicoceles, small subcentimeter Right epididymal cysts  We reviewed the common causes of scrotal pain and the basic tenets of management. For many patients, symptoms are self-limited and respond to conservative management. Initial strategies include NSAIDs, scrotal support, ice packs, and/or warm sitz baths. Identifying exacerbating triggers, modification of activities and/or premedication can help reduce episodes and severity. For chronic syndromes, we may consider pelvic floor physical therapy or referral to a chronic pain specialist. In select cases, we may consider surgical treatment with anesthetic cord blocks +/- spermatic cord denervation.

## 2024-10-23 ENCOUNTER — Ambulatory Visit: Admitting: Urology

## 2024-10-23 DIAGNOSIS — N5082 Scrotal pain: Secondary | ICD-10-CM

## 2024-10-24 ENCOUNTER — Encounter: Payer: Self-pay | Admitting: Family Medicine

## 2024-10-27 ENCOUNTER — Encounter: Payer: Self-pay | Admitting: Family

## 2024-10-27 DIAGNOSIS — E119 Type 2 diabetes mellitus without complications: Secondary | ICD-10-CM

## 2024-10-28 MED ORDER — RYBELSUS 3 MG PO TABS: 3.0000 mg | ORAL_TABLET | Freq: Every day | ORAL | 0 refills | Status: DC

## 2024-10-30 NOTE — Telephone Encounter (Signed)
 Any coupons or programs for rysbelsus?  Corey Bell prefers not to use injections if possible

## 2024-11-04 MED ORDER — LINAGLIPTIN 5 MG PO TABS: 5.0000 mg | ORAL_TABLET | Freq: Every day | ORAL | 2 refills | Status: AC

## 2024-11-04 NOTE — Addendum Note (Signed)
 Addended by: CORWIN ANTU on: 11/04/2024 09:52 AM   Modules accepted: Orders

## 2024-11-05 ENCOUNTER — Other Ambulatory Visit: Payer: Self-pay | Admitting: Family

## 2024-11-05 DIAGNOSIS — F4323 Adjustment disorder with mixed anxiety and depressed mood: Secondary | ICD-10-CM

## 2024-11-06 ENCOUNTER — Other Ambulatory Visit (HOSPITAL_COMMUNITY): Payer: Self-pay

## 2024-11-06 ENCOUNTER — Telehealth: Payer: Self-pay | Admitting: Pharmacy Technician

## 2024-11-06 NOTE — Telephone Encounter (Signed)
 Pharmacy Patient Advocate Encounter  Received notification from [employer] that Prior Authorization for Repatha  has been APPROVED from 10/09/24 to 10/08/25   PA #/Case ID/Reference #: [id number]

## 2024-11-06 NOTE — Telephone Encounter (Signed)
 Pharmacy Patient Advocate Encounter   Received notification from CoverMyMeds that prior authorization for Repatha  is required/requested.   Insurance verification completed.   The patient is insured through [employer].   Per test claim: PA required; PA submitted to above mentioned insurance via Latent Key/confirmation #/EOC [id number] Status is pending

## 2025-01-05 ENCOUNTER — Ambulatory Visit: Admitting: Family Medicine

## 2025-01-22 ENCOUNTER — Ambulatory Visit: Admitting: Cardiology
